# Patient Record
Sex: Female | Born: 1935 | Race: Black or African American | Hispanic: No | Marital: Single | Smoking: Never smoker
Health system: Southern US, Community
[De-identification: ages and names within clinical notes are randomized; demographics above are authoritative.]

## PROBLEM LIST (undated history)

## (undated) DIAGNOSIS — M109 Gout, unspecified: Secondary | ICD-10-CM

## (undated) DIAGNOSIS — N390 Urinary tract infection, site not specified: Secondary | ICD-10-CM

## (undated) DIAGNOSIS — N183 Chronic kidney disease, stage 3 unspecified: Secondary | ICD-10-CM

## (undated) DIAGNOSIS — I10 Essential (primary) hypertension: Secondary | ICD-10-CM

## (undated) DIAGNOSIS — D649 Anemia, unspecified: Secondary | ICD-10-CM

## (undated) DIAGNOSIS — M1990 Unspecified osteoarthritis, unspecified site: Secondary | ICD-10-CM

## (undated) DIAGNOSIS — I4891 Unspecified atrial fibrillation: Secondary | ICD-10-CM

## (undated) DIAGNOSIS — I509 Heart failure, unspecified: Secondary | ICD-10-CM

## (undated) DIAGNOSIS — R51 Headache: Secondary | ICD-10-CM

## (undated) DIAGNOSIS — E860 Dehydration: Secondary | ICD-10-CM

## (undated) HISTORY — PX: BREAST SURGERY: SHX581

## (undated) HISTORY — PX: ABDOMINAL HYSTERECTOMY: SHX81

---

## 1999-02-22 ENCOUNTER — Other Ambulatory Visit: Admission: RE | Admit: 1999-02-22 | Discharge: 1999-02-22 | Payer: Self-pay | Admitting: Obstetrics and Gynecology

## 2000-02-01 ENCOUNTER — Other Ambulatory Visit: Admission: RE | Admit: 2000-02-01 | Discharge: 2000-02-01 | Payer: Self-pay | Admitting: Obstetrics and Gynecology

## 2000-09-11 ENCOUNTER — Other Ambulatory Visit: Admission: RE | Admit: 2000-09-11 | Discharge: 2000-09-11 | Payer: Self-pay | Admitting: Obstetrics and Gynecology

## 2001-03-04 ENCOUNTER — Other Ambulatory Visit: Admission: RE | Admit: 2001-03-04 | Discharge: 2001-03-04 | Payer: Self-pay | Admitting: Obstetrics and Gynecology

## 2001-03-20 ENCOUNTER — Ambulatory Visit (HOSPITAL_COMMUNITY): Admission: RE | Admit: 2001-03-20 | Discharge: 2001-03-20 | Payer: Self-pay | Admitting: Family Medicine

## 2001-03-20 ENCOUNTER — Encounter: Payer: Self-pay | Admitting: Family Medicine

## 2001-09-11 ENCOUNTER — Other Ambulatory Visit: Admission: RE | Admit: 2001-09-11 | Discharge: 2001-09-11 | Payer: Self-pay | Admitting: Obstetrics and Gynecology

## 2002-03-07 ENCOUNTER — Other Ambulatory Visit: Admission: RE | Admit: 2002-03-07 | Discharge: 2002-03-07 | Payer: Self-pay | Admitting: Obstetrics and Gynecology

## 2002-03-25 ENCOUNTER — Encounter: Payer: Self-pay | Admitting: Family Medicine

## 2002-03-25 ENCOUNTER — Ambulatory Visit (HOSPITAL_COMMUNITY): Admission: RE | Admit: 2002-03-25 | Discharge: 2002-03-25 | Payer: Self-pay | Admitting: Family Medicine

## 2002-12-10 ENCOUNTER — Other Ambulatory Visit: Admission: RE | Admit: 2002-12-10 | Discharge: 2002-12-10 | Payer: Self-pay | Admitting: Obstetrics and Gynecology

## 2003-04-01 ENCOUNTER — Encounter: Payer: Self-pay | Admitting: Family Medicine

## 2003-04-01 ENCOUNTER — Ambulatory Visit (HOSPITAL_COMMUNITY): Admission: RE | Admit: 2003-04-01 | Discharge: 2003-04-01 | Payer: Self-pay | Admitting: Family Medicine

## 2003-06-29 ENCOUNTER — Other Ambulatory Visit: Admission: RE | Admit: 2003-06-29 | Discharge: 2003-06-29 | Payer: Self-pay | Admitting: Obstetrics and Gynecology

## 2003-12-17 ENCOUNTER — Other Ambulatory Visit: Admission: RE | Admit: 2003-12-17 | Discharge: 2003-12-17 | Payer: Self-pay | Admitting: Obstetrics and Gynecology

## 2004-04-04 ENCOUNTER — Ambulatory Visit (HOSPITAL_COMMUNITY): Admission: RE | Admit: 2004-04-04 | Discharge: 2004-04-04 | Payer: Self-pay | Admitting: Family Medicine

## 2004-06-29 ENCOUNTER — Other Ambulatory Visit: Admission: RE | Admit: 2004-06-29 | Discharge: 2004-06-29 | Payer: Self-pay | Admitting: Obstetrics and Gynecology

## 2004-12-14 ENCOUNTER — Other Ambulatory Visit: Admission: RE | Admit: 2004-12-14 | Discharge: 2004-12-14 | Payer: Self-pay | Admitting: Obstetrics and Gynecology

## 2005-06-19 ENCOUNTER — Ambulatory Visit (HOSPITAL_COMMUNITY): Admission: RE | Admit: 2005-06-19 | Discharge: 2005-06-19 | Payer: Self-pay | Admitting: Family Medicine

## 2005-07-17 ENCOUNTER — Other Ambulatory Visit: Admission: RE | Admit: 2005-07-17 | Discharge: 2005-07-17 | Payer: Self-pay | Admitting: Obstetrics and Gynecology

## 2005-08-17 ENCOUNTER — Ambulatory Visit (HOSPITAL_COMMUNITY): Admission: RE | Admit: 2005-08-17 | Discharge: 2005-08-17 | Payer: Self-pay | Admitting: Family Medicine

## 2005-12-27 ENCOUNTER — Other Ambulatory Visit: Admission: RE | Admit: 2005-12-27 | Discharge: 2005-12-27 | Payer: Self-pay | Admitting: Obstetrics and Gynecology

## 2006-06-06 ENCOUNTER — Other Ambulatory Visit: Admission: RE | Admit: 2006-06-06 | Discharge: 2006-06-06 | Payer: Self-pay | Admitting: Obstetrics and Gynecology

## 2006-06-28 ENCOUNTER — Ambulatory Visit (HOSPITAL_COMMUNITY): Admission: RE | Admit: 2006-06-28 | Discharge: 2006-06-28 | Payer: Self-pay | Admitting: Obstetrics and Gynecology

## 2007-07-09 ENCOUNTER — Ambulatory Visit (HOSPITAL_COMMUNITY): Admission: RE | Admit: 2007-07-09 | Discharge: 2007-07-09 | Payer: Self-pay | Admitting: Family Medicine

## 2008-07-30 ENCOUNTER — Ambulatory Visit (HOSPITAL_COMMUNITY): Admission: RE | Admit: 2008-07-30 | Discharge: 2008-07-30 | Payer: Self-pay | Admitting: Family Medicine

## 2009-08-12 ENCOUNTER — Encounter (INDEPENDENT_AMBULATORY_CARE_PROVIDER_SITE_OTHER): Payer: Self-pay | Admitting: *Deleted

## 2009-08-12 LAB — CONVERTED CEMR LAB
BUN: 41 mg/dL
CO2: 19 meq/L
CO2: 19 meq/L
Calcium: 9.8 mg/dL
Chloride: 110 meq/L
Hgb A1c MFr Bld: 5.5 %
Potassium: 4 meq/L

## 2009-08-25 ENCOUNTER — Ambulatory Visit (HOSPITAL_COMMUNITY): Admission: RE | Admit: 2009-08-25 | Discharge: 2009-08-25 | Payer: Self-pay | Admitting: Family Medicine

## 2009-11-03 ENCOUNTER — Ambulatory Visit: Payer: Self-pay | Admitting: Cardiology

## 2009-11-03 ENCOUNTER — Encounter (INDEPENDENT_AMBULATORY_CARE_PROVIDER_SITE_OTHER): Payer: Self-pay | Admitting: *Deleted

## 2009-11-03 DIAGNOSIS — I10 Essential (primary) hypertension: Secondary | ICD-10-CM

## 2009-11-03 DIAGNOSIS — I4949 Other premature depolarization: Secondary | ICD-10-CM

## 2009-11-03 DIAGNOSIS — R0602 Shortness of breath: Secondary | ICD-10-CM

## 2009-11-03 DIAGNOSIS — D649 Anemia, unspecified: Secondary | ICD-10-CM | POA: Insufficient documentation

## 2009-11-12 ENCOUNTER — Ambulatory Visit: Payer: Self-pay | Admitting: Cardiology

## 2009-11-12 ENCOUNTER — Ambulatory Visit (HOSPITAL_COMMUNITY): Admission: RE | Admit: 2009-11-12 | Discharge: 2009-11-12 | Payer: Self-pay | Admitting: Cardiology

## 2009-11-12 ENCOUNTER — Encounter: Payer: Self-pay | Admitting: Cardiology

## 2009-11-16 ENCOUNTER — Encounter (INDEPENDENT_AMBULATORY_CARE_PROVIDER_SITE_OTHER): Payer: Self-pay | Admitting: *Deleted

## 2010-08-29 ENCOUNTER — Ambulatory Visit (HOSPITAL_COMMUNITY): Admission: RE | Admit: 2010-08-29 | Discharge: 2010-08-29 | Payer: Self-pay | Admitting: Family Medicine

## 2010-11-20 ENCOUNTER — Encounter: Payer: Self-pay | Admitting: Obstetrics and Gynecology

## 2010-12-01 NOTE — Assessment & Plan Note (Signed)
Summary: NP6 HX OF PVC   Visit Type:  Initial Consult Primary Provider:  Dr. Iona Beard   History of Present Illness: Courtney Bradford comes in today for evaluation and management of palpitations and PVCs. She is sent by Dr. Berdine Addison.  She has a history of diabetes, insulin-dependent, and hypertension for about 20 years. Her son Courtney Bradford gives most of the history.  She apparently has had some palpitations in the past and these have not changed. There rest usually generally a few seconds in duration. They're not associated with dizziness, presyncope, or syncope. They do not occur with exertion.  She's had no nausea vomiting chest pain or angina. She does not complain of any shortness of breath or dyspnea on exertion.  Recent hemoglobin A1c was normal at 5.2.Her lipids are also remarkably good with total cholesterol 135, triglycerides of 64, HDL 44 and LDL of 78. Her potassium was 3.7 and creatinine was relatively normal for a lady her age.  Current Medications (verified): 1)  Amlodipine Besy-Benazepril Hcl 5-20 Mg Caps (Amlodipine Besy-Benazepril Hcl) .... Take 1 Tab Daily 2)  Ferrous Sulfate 325 (65 Fe) Mg Tabs (Ferrous Sulfate) .... Take 1 Tab Daily 3)  Catapres 0.1 Mg Tabs (Clonidine Hcl) .... Take 1 Tablet By Mouth Two Times A Day 4)  Losartan Potassium-Hctz 100-25 Mg Tabs (Losartan Potassium-Hctz) .... Take 1 Tablet By Mouth Once A Day  Allergies (verified): No Known Drug Allergies  Past History:  Past Medical History: Last updated: 11/02/2009 Current Problems:  DM (ICD-250.00) ANEMIA (ICD-285.9) HYPERTENSION (ICD-401.9) DYSPNEA (ICD-786.05)  Review of Systems       negative other than history of present illness.  Vital Signs:  Patient profile:   77 year old female Height:      62 inches Weight:      171 pounds BMI:     31.39 O2 Sat:      97 % on Room air Pulse rate:   78 / minute BP sitting:   193 / 74  (left arm)  Vitals Entered By: Doretha Sou, CNA (November 03, 2009 2:34  PM)  O2 Flow:  Room air  Physical Exam  General:  elderly, looks younger than stated age however, no acute distress Head:  normocephalic and atraumatic Eyes:  PERRLA/EOM intact; conjunctiva and lids normal. Mouth:  partial plate Neck:  Neck supple, no JVD. No masses, thyromegaly or abnormal cervical nodes. Lungs:  Clear bilaterally to auscultation and percussion. Heart:  regular rate and rhythm, no extra systoles while I listened, normal S1-S2, PMI slightly sustained but not displaced Abdomen:  Bowel sounds positive; abdomen soft and non-tender without masses, organomegaly, or hernias noted. No hepatosplenomegaly. Msk:  decreased ROM.   Pulses:  pulses normal in all 4 extremities Extremities:  No clubbing or cyanosis. Neurologic:  Alert and oriented x 3. Skin:  Intact without lesions or rashes. Psych:  Normal affect.   Problems:  Medical Problems Added: 1)  Dx of Hypertension, Benign  (ICD-401.1) 2)  Dx of Premature Ventricular Contractions  (ICD-427.69)  EKG  Procedure date:  11/03/2009  Findings:      normal sinus rhythm, minimal criteria for LVH, poor R-wave progression in the anterior precordium  Impression & Recommendations:  Problem # 1:  PREMATURE VENTRICULAR CONTRACTIONS (ICD-427.69) Assessment New  Her palpitations and history of PVCs and are most like related to her hypertension and LVH. Will obtain a 2-D echocardiogram to assess LV function and any segmental Courtney Bradford motion abnormalities. If this is relatively normal except  for left ventricular hypertrophy, we'll not change or add treatment. She does not seem to be too bothered by them. Her updated medication list for this problem includes:    Amlodipine Besy-benazepril Hcl 5-20 Mg Caps (Amlodipine besy-benazepril hcl) .Marland Kitchen... Take 1 tab daily  Orders: 2-D Echocardiogram (2D Echo)  Problem # 2:  HYPERTENSION (ICD-401.9) Assessment: Unchanged Dr. Berdine Addison recently added clonidine. The vascular and Faythe Dingwall make sure they  follow with him for good systolic control of her blood pressure. Her updated medication list for this problem includes:    Amlodipine Besy-benazepril Hcl 5-20 Mg Caps (Amlodipine besy-benazepril hcl) .Marland Kitchen... Take 1 tab daily    Catapres 0.1 Mg Tabs (Clonidine hcl) .Marland Kitchen... Take 1 tablet by mouth two times a day    Losartan Potassium-hctz 100-25 Mg Tabs (Losartan potassium-hctz) .Marland Kitchen... Take 1 tablet by mouth once a day  Orders: 2-D Echocardiogram (2D Echo)  Problem # 3:  DM (ICD-250.00) Assessment: Unchanged  Her updated medication list for this problem includes:    Amlodipine Besy-benazepril Hcl 5-20 Mg Caps (Amlodipine besy-benazepril hcl) .Marland Kitchen... Take 1 tab daily    Losartan Potassium-hctz 100-25 Mg Tabs (Losartan potassium-hctz) .Marland Kitchen... Take 1 tablet by mouth once a day  Patient Instructions: 1)  Your physician recommends that you schedule a follow-up appointment in: as needed 2)  Your physician has requested that you have an echocardiogram.  Echocardiography is a painless test that uses sound waves to create images of your heart. It provides your doctor with information about the size and shape of your heart and how well your heart's chambers and valves are working.  This procedure takes approximately one hour. There are no restrictions for this procedure.

## 2010-12-01 NOTE — Letter (Signed)
Summary:  Results Doctor, general practice at [place]. [address] [place] [address]   Phone: [phone]  Fax: [phone]      November 16, 2009 MRN: [id number]   [facility] [place], [place]  [address]   Dear Ms. FLACKS,  Your test ordered by Rande Lawman has been reviewed by your physician (or physician assistant) and was found to be normal or stable. Your physician (or physician assistant) felt no changes were needed at this time.  __x__ Echocardiogram  ____ Cardiac Stress Test  ____ Lab Work  ____ Peripheral vascular study of arms, legs or neck  ____ CT scan or X-ray  ____ Lung or Breathing test  ____ Other:  No change in medical treatment at this time, per Dr. Verl Blalock.  Thank you, Tziporah Knoke Baird Cancer RN    Jacqulyn Ducking, MD, Leana Gamer.C.Renella Cunas, MD, F.A.C.C Cristopher Peru, MD, F.A.C.C Rozann Lesches, MD, F.A.C.C Jenkins Rouge, MD, Leana Gamer.C.C

## 2010-12-01 NOTE — Miscellaneous (Signed)
Summary: labs bmp,a1c 08/12/2009  Clinical Lists Changes  Observations: Added new observation of CALCIUM: 9.8 mg/dL (08/12/2009 10:15) Added new observation of CREATININE: 112 mg/dL (08/12/2009 10:15) Added new observation of BUN: 41 mg/dL (08/12/2009 10:15) Added new observation of BG RANDOM: 111 mg/dL (08/12/2009 10:15) Added new observation of CO2 PLSM/SER: 19 meq/L (08/12/2009 10:15) Added new observation of CL SERUM: 110 meq/L (08/12/2009 10:15) Added new observation of K SERUM: 4.0 meq/L (08/12/2009 10:15) Added new observation of NA: 143 meq/L (08/12/2009 10:15) Added new observation of HGBA1C: 5.5 % (08/12/2009 10:15)

## 2010-12-01 NOTE — Miscellaneous (Signed)
Summary: labs bmp 08/13/2009  Clinical Lists Changes  Observations: Added new observation of CALCIUM: 9.8 mg/dL (08/12/2009 12:31) Added new observation of CREATININE: 112 mg/dL (08/12/2009 12:31) Added new observation of BUN: 41 mg/dL (08/12/2009 12:31) Added new observation of BG RANDOM: 111 mg/dL (08/12/2009 12:31) Added new observation of CO2 PLSM/SER: 19 meq/L (08/12/2009 12:31) Added new observation of CL SERUM: 110 meq/L (08/12/2009 12:31) Added new observation of K SERUM: 4.0 meq/L (08/12/2009 12:31) Added new observation of NA: 143 meq/L (08/12/2009 12:31) Added new observation of HGBA1C: 5.5 % (08/12/2009 12:31)

## 2011-05-11 ENCOUNTER — Encounter (HOSPITAL_COMMUNITY)
Admission: RE | Admit: 2011-05-11 | Discharge: 2011-05-11 | Disposition: A | Discharge status: Home / Self Care | Payer: PRIVATE HEALTH INSURANCE | Source: Ambulatory Visit | Attending: Obstetrics and Gynecology | Admitting: Obstetrics and Gynecology

## 2011-05-11 ENCOUNTER — Other Ambulatory Visit: Payer: Self-pay

## 2011-05-11 ENCOUNTER — Encounter (HOSPITAL_COMMUNITY): Payer: Self-pay

## 2011-05-11 HISTORY — DX: Essential (primary) hypertension: I10

## 2011-05-11 HISTORY — DX: Headache: R51

## 2011-05-11 HISTORY — DX: Anemia, unspecified: D64.9

## 2011-05-11 HISTORY — DX: Unspecified osteoarthritis, unspecified site: M19.90

## 2011-05-11 LAB — CBC
HCT: 32.6 % — ABNORMAL LOW (ref 36.0–46.0)
MCH: 29.2 pg (ref 26.0–34.0)
MCHC: 32.5 g/dL (ref 30.0–36.0)
MCV: 89.8 fL (ref 78.0–100.0)
RDW: 12.7 % (ref 11.5–15.5)
WBC: 5.9 10*3/uL (ref 4.0–10.5)

## 2011-05-11 LAB — BASIC METABOLIC PANEL
CO2: 30 mEq/L (ref 19–32)
Calcium: 10.2 mg/dL (ref 8.4–10.5)
Chloride: 104 mEq/L (ref 96–112)
Creatinine, Ser: 1 mg/dL (ref 0.50–1.10)
Sodium: 142 mEq/L (ref 135–145)

## 2011-05-11 MED ORDER — FENTANYL CITRATE 0.05 MG/ML IJ SOLN: 25.0000 ug | INTRAMUSCULAR | Status: DC | PRN

## 2011-05-11 NOTE — Patient Instructions (Addendum)
[place]  05/11/2011   Your procedure is scheduled on:  05/17/11 Wed  Report to [facility] at 7:00  Call this number if you have problems the morning of surgery: [phone]   Remember:   Do not eat food:After Midnight.  Do not drink clear liquids: After Midnight.  Take these medicines the morning of surgery with A SIP OF WATER:Amlodipine, Clonidine   Do not wear jewelry, make-up or nail polish.  Do not bring valuables to the hospital.  Contacts, dentures or bridgework may not be worn into surgery.  Leave suitcase in the car. After surgery it may be brought to your room.  For patients admitted to the hospital, checkout time is 11:00 AM the day of discharge.   Patients discharged the day of surgery will not be allowed to drive home.  Name and phone number of your driver: na  Special Instructions: CHG Shower Shower 2 days before surgery and 1 day before surgery with Hibiclens.   Please read over the following fact sheets that you were given: Pain Booklet and Surgical Site Infection Prevention

## 2011-05-11 NOTE — Anesthesia Preprocedure Evaluation (Addendum)
Anesthesia Evaluation  Name, MR# and DOB Patient awake  General Assessment Comment  Reviewed: Allergy & Precautions, H&P  and Patient's Chart, lab work & pertinent test results  Airway Mallampati: III TM Distance: >3 FB     Dental No notable dental hx (+) Partial Lower and Upper Dentures   Pulmonaryneg pulmonary ROS    clear to auscultation  pulmonary exam normal   Cardiovascular Regular Normal   Neuro/Psych  GI/Hepatic/Renal negative GI ROS, negative Liver ROS, and negative Renal ROS (+)       Endo/Other  Negative Endocrine ROS (+)   Abdominal Normal abdominal exam  (+)   Musculoskeletal negative musculoskeletal ROS (+)  Hematology negative hematology ROS (+)   Peds  Reproductive/Obstetrics negative OB ROS   Anesthesia Other Findings             Anesthesia Physical Anesthesia Plan  ASA: II  Anesthesia Plan: Spinal, Regional and Combined Spinal and Epidural   Post-op Pain Management:    Induction:   Airway Management Planned: Mask  Additional Equipment:   Intra-op Plan:   Post-operative Plan:   Informed Consent: I have reviewed the patients History and Physical, chart, labs and discussed the procedure including the risks, benefits and alternatives for the proposed anesthesia with the patient or authorized representative who has indicated his/her understanding and acceptance.   Dental advisory given  Plan Discussed with: Anesthesiologist (AP)  Anesthesia Plan Comments:       Anesthesia Quick Evaluation

## 2011-05-16 DIAGNOSIS — N393 Stress incontinence (female) (male): Secondary | ICD-10-CM | POA: Diagnosis present

## 2011-05-16 DIAGNOSIS — N8189 Other female genital prolapse: Secondary | ICD-10-CM | POA: Diagnosis present

## 2011-05-16 MED ORDER — CEFAZOLIN SODIUM-DEXTROSE 2-3 GM-% IV SOLR
2.0000 g | INTRAVENOUS | Status: DC
  Administered 2011-05-17: 2 g via INTRAVENOUS
  Filled 2011-05-16 (×2): qty 50

## 2011-05-16 NOTE — H&P (Signed)
[name], Courtney Bradford NO.:  [id number]  MEDICAL RECORD NO.:  [id number]  LOCATION:  [facility]C                           FACILITY:  [facility]  PHYSICIAN:  Eli Hose, M.D.DATE OF BIRTH:  [birth year]  DATE OF ADMISSION:  05/11/2011 DATE OF DISCHARGE:  05/11/2011                             HISTORY & PHYSICAL   HISTORY OF PRESENT ILLNESS:  [name] is a 77 year old female, para 6- 0-0-6, who presents for an anterior and posterior colporrhaphy with placement of a tension-free vaginal tape.  She will also have cystoscopy.  The patient has been followed at the [facility] and [facility]cology Division of [facility] for women. The patient reports increasing stress urinary incontinence and pelvic pressure symptoms.  A pessary was not successful in relieving her discomfort.  In 1992, the patient had a total abdominal hysterectomy with bilateral salpingo-oophorectomy because of a stage IB1 adenocarcinoma of the endometrium.  She has been without evidence of disease since that time.  OBSTETRICAL HISTORY:  The patient has had six vaginal deliveries at term.  DRUG ALLERGIES:  No known drug allergies.  PAST MEDICAL HISTORY:  The patient has a history of hypertension, diabetes, gout, osteoarthritis, and obesity.  She has been cleared for surgery by her family physician (Dr. Iona Beard).  CURRENT MEDICATIONS: 1. Tekturna HCT 150/12.5 one p.o. daily (this will not be renewed     after the current dosage). 2. Benazepril/amlodipine 20/5 one p.o. daily. 3. Clonidine 0.1 mg 1 tablet twice each day. 4. Calcium 600 mg and vitamin D 1 tablet twice each day. 5. Iron 325 mg one each day. 6. Humulin N insulin and has 10 units in the morning at breakfast.  PAST SURGICAL HISTORY:  The patient has had a partial mastectomy for benign disease.  She has had various other minor surgical procedures.  SOCIAL HISTORY:  The patient denies cigarette use, alcohol use,  and recreational drug use.  REVIEW OF SYSTEMS:  Please see history of present illness.  FAMILY HISTORY:  Noncontributory.  PHYSICAL EXAM:  VITAL SIGNS:  Weight is 165 pounds.  Height is 5 feet 2 inches. HEENT:  Within normal limits. CHEST:  Clear. HEART:  Regular rate and rhythm. BREASTS:  Are without masses. ABDOMEN:  Nontender. EXTREMITIES:  Shows crepitus in her left knee.  There is no tenderness. There is mild edema bilaterally in the lower extremities. NEUROLOGIC:  Grossly normal. PELVIC:  External genitalia is atrophic.  The vagina shows a large cystocele and a large rectocele with loss of urethrovesical angle. There are atrophic changes.  The vaginal vault is well suspended however.  Cervix is absent.  Uterus is absent.  Adnexa, no masses and rectovaginal exam confirms.  ASSESSMENT: 1. Symptomatic pelvic relaxation with increasing stress urinary     incontinence.  The patient reports that her symptoms have     progressed to the point that she has a "urine" smell and that she     cannot stop her bladder from leaking. 2. Hypertension. 3. Diabetes. 4. Gout. 5. Osteoarthritis. 6. Obesity.  PLAN:  The patient will undergo an anterior and posterior colporrhaphy with a tension-free vaginal tape placement.  She will  also have cystoscopy.  She understands the indications for her surgical procedure as well as her alternative treatment options.  We have discussed the concerns about mesh erosion.  She has been informed of the [facility] website. She accepts the risks, but not limited to, anesthetic complications, bleeding, infection, and possible damage to surrounding organs.  We will give the patient subcutaneous heparin as well as sequential compression devices for venous thrombosis prophylaxis.     Eli Hose, M.D.     AVS/MEDQ  D:  05/16/2011  T:  05/16/2011  Job:  [id number]  cc:   Barrie Folk. Berdine Addison, MD Fax: [phone]

## 2011-05-17 ENCOUNTER — Encounter (HOSPITAL_COMMUNITY)
Admission: RE | Disposition: A | Discharge status: Home / Self Care | Payer: Self-pay | Source: Ambulatory Visit | Attending: Obstetrics and Gynecology

## 2011-05-17 ENCOUNTER — Encounter (HOSPITAL_COMMUNITY): Payer: Self-pay | Admitting: Anesthesiology

## 2011-05-17 ENCOUNTER — Ambulatory Visit (HOSPITAL_COMMUNITY): Payer: PRIVATE HEALTH INSURANCE | Admitting: Anesthesiology

## 2011-05-17 ENCOUNTER — Inpatient Hospital Stay (HOSPITAL_COMMUNITY)
Admission: RE | Admit: 2011-05-17 | Discharge: 2011-05-19 | DRG: 748 | Disposition: A | Discharge status: Home / Self Care | Payer: PRIVATE HEALTH INSURANCE | Source: Ambulatory Visit | Attending: Obstetrics and Gynecology | Admitting: Obstetrics and Gynecology

## 2011-05-17 DIAGNOSIS — D649 Anemia, unspecified: Secondary | ICD-10-CM | POA: Diagnosis present

## 2011-05-17 DIAGNOSIS — I4949 Other premature depolarization: Secondary | ICD-10-CM

## 2011-05-17 DIAGNOSIS — N8189 Other female genital prolapse: Secondary | ICD-10-CM

## 2011-05-17 DIAGNOSIS — M109 Gout, unspecified: Secondary | ICD-10-CM | POA: Diagnosis present

## 2011-05-17 DIAGNOSIS — N393 Stress incontinence (female) (male): Secondary | ICD-10-CM | POA: Diagnosis present

## 2011-05-17 DIAGNOSIS — I441 Atrioventricular block, second degree: Secondary | ICD-10-CM | POA: Diagnosis not present

## 2011-05-17 DIAGNOSIS — E119 Type 2 diabetes mellitus without complications: Secondary | ICD-10-CM | POA: Diagnosis present

## 2011-05-17 DIAGNOSIS — I10 Essential (primary) hypertension: Secondary | ICD-10-CM | POA: Diagnosis present

## 2011-05-17 DIAGNOSIS — I498 Other specified cardiac arrhythmias: Secondary | ICD-10-CM

## 2011-05-17 DIAGNOSIS — N993 Prolapse of vaginal vault after hysterectomy: Principal | ICD-10-CM | POA: Diagnosis present

## 2011-05-17 HISTORY — PX: BLADDER SUSPENSION: SHX72

## 2011-05-17 HISTORY — PX: ANTERIOR AND POSTERIOR REPAIR: SHX5121

## 2011-05-17 LAB — GLUCOSE, CAPILLARY
Glucose-Capillary: 115 mg/dL — ABNORMAL HIGH (ref 70–99)
Glucose-Capillary: 117 mg/dL — ABNORMAL HIGH (ref 70–99)
Glucose-Capillary: 112 mg/dL — ABNORMAL HIGH (ref 70–99)
Glucose-Capillary: 155 mg/dL — ABNORMAL HIGH (ref 70–99)

## 2011-05-17 SURGERY — ANTERIOR (CYSTOCELE) AND POSTERIOR REPAIR (RECTOCELE)
Anesthesia: General | Site: Vagina

## 2011-05-17 MED ORDER — OLOPATADINE HCL 0.2 % OP SOLN: 1.0000 [drp] | Freq: Every day | OPHTHALMIC | Status: DC

## 2011-05-17 MED ORDER — MORPHINE SULFATE (PF) 1 MG/ML IV SOLN: INTRAVENOUS | Status: DC

## 2011-05-17 MED ORDER — ONDANSETRON HCL 4 MG/2ML IJ SOLN: 4.0000 mg | Freq: Four times a day (QID) | INTRAMUSCULAR | Status: DC | PRN

## 2011-05-17 MED ORDER — MEPERIDINE HCL 25 MG/ML IJ SOLN: 6.2500 mg | INTRAMUSCULAR | Status: DC | PRN

## 2011-05-17 MED ORDER — SODIUM CHLORIDE 0.9 % IJ SOLN
3.0000 mL | Freq: Two times a day (BID) | INTRAMUSCULAR | Status: DC
  Administered 2011-05-18: 3 mL via INTRAVENOUS

## 2011-05-17 MED ORDER — AMLODIPINE BESYLATE 5 MG PO TABS
5.0000 mg | ORAL_TABLET | Freq: Every day | ORAL | Status: DC
Start: 2011-05-17 — End: 2011-05-19
  Administered 2011-05-18 – 2011-05-19 (×2): 5 mg via ORAL
  Filled 2011-05-17 (×3): qty 1

## 2011-05-17 MED ORDER — ONDANSETRON HCL 4 MG PO TABS: 4.0000 mg | ORAL_TABLET | Freq: Four times a day (QID) | ORAL | Status: DC | PRN

## 2011-05-17 MED ORDER — CLONIDINE HCL 0.1 MG PO TABS
0.1000 mg | ORAL_TABLET | Freq: Two times a day (BID) | ORAL | Status: DC
  Administered 2011-05-17 – 2011-05-19 (×4): 0.1 mg via ORAL
  Filled 2011-05-17 (×5): qty 1

## 2011-05-17 MED ORDER — INDIGOTINDISULFONATE SODIUM 8 MG/ML IJ SOLN
INTRAMUSCULAR | Status: DC | PRN
  Administered 2011-05-17: 5 mL via INTRAVENOUS

## 2011-05-17 MED ORDER — KETOROLAC TROMETHAMINE 30 MG/ML IJ SOLN
INTRAMUSCULAR | Status: DC | PRN
  Administered 2011-05-17: 30 mg via INTRAVENOUS

## 2011-05-17 MED ORDER — DEXTROSE-NACL 5-0.45 % IV SOLN
INTRAVENOUS | Status: DC
  Administered 2011-05-17 (×2): via INTRAVENOUS

## 2011-05-17 MED ORDER — ACETAMINOPHEN 325 MG PO TABS: 325.0000 mg | ORAL_TABLET | ORAL | Status: DC | PRN

## 2011-05-17 MED ORDER — KETOROLAC TROMETHAMINE 30 MG/ML IJ SOLN: 30.0000 mg | Freq: Four times a day (QID) | INTRAMUSCULAR | Status: AC | PRN

## 2011-05-17 MED ORDER — SIMETHICONE 80 MG PO CHEW: 80.0000 mg | CHEWABLE_TABLET | Freq: Four times a day (QID) | ORAL | Status: DC | PRN

## 2011-05-17 MED ORDER — INSULIN NPH (HUMAN) (ISOPHANE) 100 UNIT/ML ~~LOC~~ SUSP
10.0000 [IU] | SUBCUTANEOUS | Status: DC
  Administered 2011-05-18 – 2011-05-19 (×2): 10 [IU] via SUBCUTANEOUS
  Filled 2011-05-17: qty 3

## 2011-05-17 MED ORDER — STERILE WATER FOR IRRIGATION IR SOLN
Status: DC | PRN
  Administered 2011-05-17: 1000 mL via INTRAVESICAL

## 2011-05-17 MED ORDER — MENTHOL 3 MG MT LOZG: 1.0000 | LOZENGE | OROMUCOSAL | Status: DC | PRN

## 2011-05-17 MED ORDER — SODIUM CHLORIDE 0.9 % IJ SOLN: 3.0000 mL | INTRAMUSCULAR | Status: DC | PRN

## 2011-05-17 MED ORDER — SODIUM CHLORIDE 0.9 % IV SOLN: 250.0000 mL | INTRAVENOUS | Status: DC

## 2011-05-17 MED ORDER — BISACODYL 5 MG PO TBEC
5.0000 mg | DELAYED_RELEASE_TABLET | Freq: Every day | ORAL | Status: DC | PRN
  Filled 2011-05-17: qty 1

## 2011-05-17 MED ORDER — FENTANYL CITRATE 0.05 MG/ML IJ SOLN: 25.0000 ug | INTRAMUSCULAR | Status: DC | PRN

## 2011-05-17 MED ORDER — HEPARIN SODIUM (PORCINE) 5000 UNIT/ML IJ SOLN
INTRAMUSCULAR | Status: AC
  Filled 2011-05-17: qty 1

## 2011-05-17 MED ORDER — MORPHINE SULFATE (PF) 0.5 MG/ML IJ SOLN
INTRAMUSCULAR | Status: DC | PRN
  Administered 2011-05-17: 100 ug via INTRATHECAL

## 2011-05-17 MED ORDER — KETOROLAC TROMETHAMINE 60 MG/2ML IM SOLN
60.0000 mg | Freq: Once | INTRAMUSCULAR | Status: AC | PRN
  Filled 2011-05-17: qty 2

## 2011-05-17 MED ORDER — CEFAZOLIN SODIUM 1-5 GM-% IV SOLN
INTRAVENOUS | Status: DC | PRN
  Administered 2011-05-17: 2 g via INTRAVENOUS

## 2011-05-17 MED ORDER — NALBUPHINE HCL 10 MG/ML IJ SOLN
5.0000 mg | INTRAMUSCULAR | Status: AC | PRN
  Filled 2011-05-17: qty 1

## 2011-05-17 MED ORDER — ONDANSETRON HCL 4 MG/2ML IJ SOLN
INTRAMUSCULAR | Status: DC | PRN
  Administered 2011-05-17: 4 mg via INTRAVENOUS

## 2011-05-17 MED ORDER — ONDANSETRON HCL 4 MG/2ML IJ SOLN: 4.0000 mg | Freq: Once | INTRAMUSCULAR | Status: DC | PRN

## 2011-05-17 MED ORDER — ACETAMINOPHEN 325 MG PO TABS: 650.0000 mg | ORAL_TABLET | Freq: Four times a day (QID) | ORAL | Status: DC | PRN

## 2011-05-17 MED ORDER — SODIUM CHLORIDE 0.9 % IR SOLN
Status: DC | PRN
  Administered 2011-05-17: 1000 mL

## 2011-05-17 MED ORDER — PROPOFOL 10 MG/ML IV EMUL
INTRAVENOUS | Status: DC | PRN
  Administered 2011-05-17: 25 ug/kg/min via INTRAVENOUS

## 2011-05-17 MED ORDER — INSULIN REGULAR HUMAN 100 UNIT/ML IJ SOLN
10.0000 [IU] | INTRAMUSCULAR | Status: DC
  Administered 2011-05-18 – 2011-05-19 (×2): 10 [IU] via SUBCUTANEOUS
  Filled 2011-05-17 (×4): qty 3

## 2011-05-17 MED ORDER — HEPARIN SODIUM (PORCINE) 5000 UNIT/ML IJ SOLN
5000.0000 [IU] | Freq: Three times a day (TID) | INTRAMUSCULAR | Status: AC
  Administered 2011-05-17: 07:00:00 via SUBCUTANEOUS
  Administered 2011-05-17 – 2011-05-19 (×5): 5000 [IU] via SUBCUTANEOUS
  Filled 2011-05-17 (×6): qty 1

## 2011-05-17 MED ORDER — EPHEDRINE SULFATE 50 MG/ML IJ SOLN
INTRAMUSCULAR | Status: DC | PRN
  Administered 2011-05-17: 10 mg via INTRAVENOUS

## 2011-05-17 MED ORDER — IBUPROFEN 600 MG PO TABS: 600.0000 mg | ORAL_TABLET | Freq: Four times a day (QID) | ORAL | Status: DC | PRN

## 2011-05-17 MED ORDER — GUAIFENESIN 100 MG/5ML PO SOLN: 15.0000 mL | ORAL | Status: DC | PRN

## 2011-05-17 MED ORDER — MIDAZOLAM HCL 5 MG/5ML IJ SOLN
INTRAMUSCULAR | Status: DC | PRN
  Administered 2011-05-17: 1 mg via INTRAVENOUS

## 2011-05-17 MED ORDER — SODIUM CHLORIDE 0.9 % IV SOLN
1.0000 ug/kg/h | INTRAVENOUS | Status: DC | PRN
  Filled 2011-05-17: qty 2.5

## 2011-05-17 MED ORDER — ALISKIREN FUMARATE 150 MG PO TABS
150.0000 mg | ORAL_TABLET | Freq: Every day | ORAL | Status: DC
  Administered 2011-05-18 – 2011-05-19 (×2): 150 mg via ORAL
  Filled 2011-05-17 (×3): qty 1

## 2011-05-17 MED ORDER — NALOXONE HCL 0.4 MG/ML IJ SOLN: 0.4000 mg | INTRAMUSCULAR | Status: DC | PRN

## 2011-05-17 MED ORDER — BUPIVACAINE-EPINEPHRINE PF 0.5-1:200000 % IJ SOLN
INTRAMUSCULAR | Status: DC | PRN
  Administered 2011-05-17: 13 mL

## 2011-05-17 MED ORDER — HEPARIN SODIUM (PORCINE) 5000 UNIT/ML IJ SOLN
5000.0000 [IU] | Freq: Once | INTRAMUSCULAR | Status: DC
  Filled 2011-05-17: qty 1

## 2011-05-17 MED ORDER — HYDROCODONE-ACETAMINOPHEN 5-325 MG PO TABS: 1.0000 | ORAL_TABLET | ORAL | Status: DC | PRN

## 2011-05-17 MED ORDER — BISACODYL 10 MG RE SUPP: 10.0000 mg | Freq: Every day | RECTAL | Status: DC | PRN

## 2011-05-17 MED ORDER — LACTATED RINGERS IV SOLN
INTRAVENOUS | Status: DC
  Administered 2011-05-17 (×2): via INTRAVENOUS

## 2011-05-17 SURGICAL SUPPLY — 37 items
BLADE SURG 15 STRL LF C SS BP (BLADE) ×6 IMPLANT
BLADE SURG 15 STRL SS (BLADE) ×9
CATH BONANNO SUPRAPUBIC 14G (CATHETERS) IMPLANT
CATH FOLEY 2WAY  5CC 16FR SIL (CATHETERS)
CATH FOLEY 2WAY 5CC 16FR SIL (CATHETERS) ×2 IMPLANT
CATH FOLEY 2WAY SLVR 18FR 30CC (CATHETERS) ×2 IMPLANT
CLOTH BEACON ORANGE TIMEOUT ST (SAFETY) ×3 IMPLANT
CONT PATH 16OZ SNAP LID 3702 (MISCELLANEOUS) IMPLANT
DECANTER SPIKE VIAL GLASS SM (MISCELLANEOUS) ×1 IMPLANT
DERMABOND ADVANCED (GAUZE/BANDAGES/DRESSINGS) ×1 IMPLANT
DRAPE CAMERA CLOSED 9X96 (DRAPES) IMPLANT
GAUZE PACKING 1 X5 YD ST (GAUZE/BANDAGES/DRESSINGS) ×1 IMPLANT
GAUZE SPONGE 4X4 12PLY STRL LF (GAUZE/BANDAGES/DRESSINGS) ×3 IMPLANT
GLOVE BIOGEL PI IND STRL 8.5 (GLOVE) ×2 IMPLANT
GLOVE BIOGEL PI INDICATOR 8.5 (GLOVE) ×1
GLOVE ECLIPSE 8.0 STRL XLNG CF (GLOVE) ×6 IMPLANT
GOWN BRE IMP SLV AUR LG STRL (GOWN DISPOSABLE) ×9 IMPLANT
GOWN STRL REIN 2XL LVL4 (GOWN DISPOSABLE) ×3 IMPLANT
NDL SPNL 22GX3.5 QUINCKE BK (NEEDLE) IMPLANT
NEEDLE HYPO 22GX1.5 SAFETY (NEEDLE) ×1 IMPLANT
NEEDLE SPNL 22GX3.5 QUINCKE BK (NEEDLE) IMPLANT
NS IRRIG 1000ML POUR BTL (IV SOLUTION) ×3 IMPLANT
PACK VAGINAL WOMENS (CUSTOM PROCEDURE TRAY) ×3 IMPLANT
SET CYSTO W/LG BORE CLAMP LF (SET/KITS/TRAYS/PACK) ×1 IMPLANT
SLING TRANS VAGINAL TAPE (Sling) ×1 IMPLANT
SLING UTERINE/ABD GYNECARE TVT (Sling) IMPLANT
SPONGE LAP 4X18 X RAY DECT (DISPOSABLE) ×4 IMPLANT
SUT CHROMIC 0 SH (SUTURE) ×6 IMPLANT
SUT CHROMIC 2 0 SH (SUTURE) ×9 IMPLANT
SUT SILK 2 0 FSL 18 (SUTURE) ×3 IMPLANT
SUT VIC AB 2-0 CT1 27 (SUTURE) ×6
SUT VIC AB 2-0 CT1 TAPERPNT 27 (SUTURE) ×4 IMPLANT
SUT VICRYL 0 UR6 27IN ABS (SUTURE) ×2 IMPLANT
SYR 20CC LL (SYRINGE) ×1 IMPLANT
TOWEL OR 17X24 6PK STRL BLUE (TOWEL DISPOSABLE) ×6 IMPLANT
TRAY FOLEY CATH 14FR (SET/KITS/TRAYS/PACK) ×3 IMPLANT
WATER STERILE IRR 1000ML POUR (IV SOLUTION) ×3 IMPLANT

## 2011-05-17 NOTE — Interval H&P Note (Signed)
History and Physical Interval Note:   05/17/2011   8:02 AM   Courtney Bradford  has presented today for surgery, with the diagnosis of sui;pelvic relaxtion  The various methods of treatment have been discussed with the patient and family. After consideration of risks, benefits and other options for treatment, the patient has consented to  Procedure(s): ANTERIOR (CYSTOCELE) AND POSTERIOR REPAIR (RECTOCELE), TVT and Cystoscopy as a surgical intervention .  I have reviewed the patients' chart and labs.  Questions were answered to the patient's satisfaction.     Eli Hose  MD

## 2011-05-17 NOTE — Progress Notes (Signed)
Subjective: Patient reports tolerating PO.  No SOB or chest pain.  Objective: I have reviewed patient's vital signs and intake and output.  Filed Vitals:   05/17/11 1800  BP: 158/59  Pulse: 58  Temp:   Resp: 18   BS 155  Heart block noted on heart monitor.  General: alert, cooperative and no distress Resp: clear to auscultation bilaterally Cardio: regularly irregular rhythm GI: soft, non-tender; bowel sounds normal; no masses,  no organomegaly Extremities: edema trace Vaginal Bleeding: None  Assessment/Plan: Post Op Day 0 Anterior Repair, TVT, and Cystoscopy Second degree heart block, but no distress. I have consulted Dr. Lia Foyer from the Cardiology Service and he will arrange for the patient to be seen.  LOS: 0 days    Courtney Bradford 05/17/2011, 6:10 PM

## 2011-05-17 NOTE — Transfer of Care (Signed)
Immediate Anesthesia Transfer of Care Note  Patient: Courtney Bradford  Procedure(s) Performed:  ANTERIOR (CYSTOCELE) AND POSTERIOR REPAIR (RECTOCELE) - Anterior repair with TVT bladder sling and cystoscopy; TRANSVAGINAL TAPE (TVT) PROCEDURE  Patient Location: PACU  Anesthesia Type: Spinal  Level of Consciousness: awake  Airway & Oxygen Therapy: Patient Spontanous Breathing  Post-op Assessment: Report given to PACU RN and Post -op Vital signs reviewed and stable  Post vital signs: Reviewed and stable  Complications: No apparent anesthesia complications

## 2011-05-17 NOTE — Progress Notes (Signed)
Day of Surgery Procedure(s): ANTERIOR (CYSTOCELE) AND POSTERIOR REPAIR (RECTOCELE) TRANSVAGINAL TAPE (TVT) PROCEDURE  Subjective: Patient reports no complaints.    Objective: Filed Vitals:   05/17/11 1600  BP: 168/65  Pulse: 62  Temp: 98.5 F (36.9 C)  Resp: 15  UOP 200cc/54min  I have reviewed patient's vital signs.  General: alert and cooperative Resp: clear to auscultation bilaterally Cardio: regular rate and rhythm GI: soft, non-tender; bowel sounds normal; no masses,  no organomegaly Extremities: extremities normal, atraumatic, no cyanosis or edema and Homans sign is negative, no sign of DVT Vaginal Bleeding: none and vaginal packing in place  Assessment: s/p Procedure(s): ANTERIOR (CYSTOCELE) AND POSTERIOR REPAIR (RECTOCELE) TRANSVAGINAL TAPE (TVT) PROCEDURE: stable  Plan: Advance diet as tolerated Continue strict I's/O's UOP is good IS encouraged SCDs for DVT prophylaxis as well as SQ heparin   LOS: 0 days    Simran Mannis Y 05/17/2011, 4:26 PM

## 2011-05-17 NOTE — Brief Op Note (Signed)
05/17/2011  10:39 AM  PATIENT:  Courtney Bradford  77 y.o. female  PRE-OPERATIVE DIAGNOSIS:  Stress induced incoontinence;pelvic relaxtion  POST-OPERATIVE DIAGNOSIS:  same  PROCEDURE:  Procedure(s): ANTERIOR (CYSTOCELE) AND POSTERIOR REPAIR (RECTOCELE) TRANSVAGINAL TAPE (TVT) PROCEDURE  SURGEON:  Surgeon(s): Richardo Priest  PHYSICIAN ASSISTANT:   ASSISTANTS: Gildardo Cranker, M.D.   Everett Graff, M.D.   ANESTHESIA:   spinal  ESTIMATED BLOOD LOSS: * No blood loss amount entered *   BLOOD ADMINISTERED:none  DRAINS: none   LOCAL MEDICATIONS USED:  MARCAINE 10CC  SPECIMEN:  No Specimen  DISPOSITION OF SPECIMEN:  N/A  COUNTS:  YES  TOURNIQUET:  * No tourniquets in log *  DICTATION #: [id number]  PLAN OF CARE: PostOp  PATIENT DISPOSITION:  PACU - hemodynamically stable.   Delay start of Pharmacological VTE agent (>24hrs) due to surgical blood loss or risk of bleeding:  no

## 2011-05-17 NOTE — Consult Note (Signed)
Cardiology Consult [facility] MD-Dr. Verl Blalock PMD- Dr. Berdine Addison  Patient interviewed and examined.  Prior records obtained and reviewed.  Full note to follow.  She was last seen by [facility] in 10/2009 for evaluation of palpitations and PVCs.  Hypertension was suboptimally controlled, and her medications were adjusted.  Echocardiogram showed borderline LVH and normal LV systolic function.  No further testing, treatment nor F/u was advised.  She has done well with no subsequent medical problems.  Post-op bradycardia was noted.  Rhythm strip shows second degree AVB, type 1.  Increase vagal tone is likely contributing to conduction abnormalities although her discomfort is minimal.  She is on no medication to exaccerate conduction system disease other than perhaps clonidine.  For now we will make no changes in meds, R/O MI and optimize Rx of hypertension.  Electrolytes, Mg, and TSH are pending.  We will follow with you.      Jacqulyn Ducking, MD

## 2011-05-17 NOTE — Progress Notes (Signed)
Pt. Transferred from Women's Unit with asymptomatic bradycardia in to bed #373. Pt. A&O and denies any pain or discomforts at this time. Telemetry monitoring revealing 2nd degree Heart Block. EKG strips with values printed and placed in chart.  MRSA screening done. Pt. Made comfortable and oriented to ICU surroundings and procedures. Education done and questions answered with family at bedside

## 2011-05-17 NOTE — Progress Notes (Signed)
Encounter addended by: Riccardo Dubin on: 05/17/2011  3:42 PM<BR>     Documentation filed: Notes Section

## 2011-05-17 NOTE — Progress Notes (Signed)
Dr. Raphael Gibney aware of CBG - 155.  No new orders received

## 2011-05-17 NOTE — H&P (View-Only) (Signed)
[name], [name] NO.:  [id number]  MEDICAL RECORD NO.:  [id number]  LOCATION:  [facility]                           FACILITY:  [place]  PHYSICIAN:  Eli Hose, M.D.DATE OF BIRTH:  [birth year]  DATE OF ADMISSION:  05/11/2011 DATE OF DISCHARGE:  05/11/2011                             HISTORY & PHYSICAL   HISTORY OF PRESENT ILLNESS:  Courtney Bradford is a 77 year old female, para 6- 0-0-6, who presents for an anterior and posterior colporrhaphy with placement of a tension-free vaginal tape.  She will also have cystoscopy.  The patient has been followed at the [facility] and Gynecology Division of [facility] for women. The patient reports increasing stress urinary incontinence and pelvic pressure symptoms.  A pessary was not successful in relieving her discomfort.  In 1992, the patient had a total abdominal hysterectomy with bilateral salpingo-oophorectomy because of a stage IB1 adenocarcinoma of the endometrium.  She has been without evidence of disease since that time.  OBSTETRICAL HISTORY:  The patient has had six vaginal deliveries at term.  DRUG ALLERGIES:  No known drug allergies.  PAST MEDICAL HISTORY:  The patient has a history of hypertension, diabetes, gout, osteoarthritis, and obesity.  She has been cleared for surgery by her family physician (Dr. Iona Beard).  CURRENT MEDICATIONS: 1. Tekturna HCT 150/12.5 one p.o. daily (this will not be renewed     after the current dosage). 2. Benazepril/amlodipine 20/5 one p.o. daily. 3. Clonidine 0.1 mg 1 tablet twice each day. 4. Calcium 600 mg and vitamin D 1 tablet twice each day. 5. Iron 325 mg one each day. 6. Humulin N insulin and has 10 units in the morning at breakfast.  PAST SURGICAL HISTORY:  The patient has had a partial mastectomy for benign disease.  She has had various other minor surgical procedures.  SOCIAL HISTORY:  The patient denies cigarette use, alcohol use,  and recreational drug use.  REVIEW OF SYSTEMS:  Please see history of present illness.  FAMILY HISTORY:  Noncontributory.  PHYSICAL EXAM:  VITAL SIGNS:  Weight is 165 pounds.  Height is 5 feet 2 inches. HEENT:  Within normal limits. CHEST:  Clear. HEART:  Regular rate and rhythm. BREASTS:  Are without masses. ABDOMEN:  Nontender. EXTREMITIES:  Shows crepitus in her left knee.  There is no tenderness. There is mild edema bilaterally in the lower extremities. NEUROLOGIC:  Grossly normal. PELVIC:  External genitalia is atrophic.  The vagina shows a large cystocele and a large rectocele with loss of urethrovesical angle. There are atrophic changes.  The vaginal vault is well suspended however.  Cervix is absent.  Uterus is absent.  Adnexa, no masses and rectovaginal exam confirms.  ASSESSMENT: 1. Symptomatic pelvic relaxation with increasing stress urinary     incontinence.  The patient reports that her symptoms have     progressed to the point that she has a "urine" smell and that she     cannot stop her bladder from leaking. 2. Hypertension. 3. Diabetes. 4. Gout. 5. Osteoarthritis. 6. Obesity.  PLAN:  The patient will undergo an anterior and posterior colporrhaphy with a tension-free vaginal tape placement.  She will  also have cystoscopy.  She understands the indications for her surgical procedure as well as her alternative treatment options.  We have discussed the concerns about mesh erosion.  She has been informed of the [facility] website. She accepts the risks, but not limited to, anesthetic complications, bleeding, infection, and possible damage to surrounding organs.  We will give the patient subcutaneous heparin as well as sequential compression devices for venous thrombosis prophylaxis.     Eli Hose, M.D.     AVS/MEDQ  D:  05/16/2011  T:  05/16/2011  Job:  [id number]  cc:   Barrie Folk. Berdine Addison, MD Fax: [phone]

## 2011-05-17 NOTE — Op Note (Signed)
Preop Diagnosis: SUI  Postop Diagnosis: SUI  Procedure:1.TVT 2. Cystoscopy  Complications:none  Procedure: While the patient was in day surgery prior to starting the procedure, the risk benefits and alternatives were discussed with the patient including but not limited to bleeding, infection, injury, urinary retention and erosion of mesh. The patient was taken to the operating room where Dr. Raphael Gibney performed his portion of the surgery. I then proceeded to perform a TVT and cystoscopy. The anterior vaginal wall was already incised and this area was incised further rostrally. The underlying tissue was dissected away down to the level of the lower symphysis pubis bilaterally. Attention was then turned to the mons pubis where two 5 mm incisions were made 2 fingerbreadths from the midline. The transabdominal guide was then passed through the mons pubis incision on the patient's right down through the space of Retzius and out through the anterior vaginal wall after deflecting the rigid urethral catheter guide to the ipsilateral side. The same was done on the contralateral side. Cystoscopy was performed and no invadvertant bladder injury was noted. The bladder was drained with a Foley while deflecting the rigid urethral catheter guide to the patient's right and the mesh was attached to the transabdominal guide and elevated up through the space of Retzius and out through the incision on the mons pubis on the ipsilateral side. The same was done on the contralateral side. Cystoscopy was performed again and no inadvertant bladder injury was noted. The 37 French Foley was left in the urethra and a large Claiborne Billings was placed between the urethra and the mesh in order to leave the mesh slack beneath the midurethra. The mesh was then cut flush with the skin at the mons pubis incisions bilaterally. Indigo carmine had been administered, cystoscopy was performed again and bilateral ureters were noted to efflux without  difficulty. The bilateral incisions on the mons pubis were then cleaned and Dermabond applied. The remainder of the procedure was completed by Dr. Raphael Gibney.

## 2011-05-17 NOTE — Anesthesia Procedure Notes (Addendum)
Spinal Block  Patient location during procedure: OR Staffing Anesthesiologist: Riccardo Dubin Preanesthetic Checklist Completed: patient identified, site marked, surgical consent, pre-op evaluation, timeout performed, IV checked, risks and benefits discussed and monitors and equipment checked Spinal Block Patient position: sitting Prep: DuraPrep Patient monitoring: heart rate, cardiac monitor, continuous pulse ox and blood pressure Approach: midline Location: L3-4 Injection technique: single-shot Needle Needle type: Sprotte  Needle gauge: 24 G Needle length: 9 cm Assessment Sensory level: T8 Additional Notes Spinal Dosage in OR  Bupivicaine ml       1.4 PFMS04   mcg       100

## 2011-05-17 NOTE — Op Note (Signed)
NAMESOLANGE, Bradford NO.:  [id number]  MEDICAL RECORD NO.:  [id number]  LOCATION:  9309                          FACILITY:  [facility]  PHYSICIAN:  Eli Hose, M.D.DATE OF BIRTH:  1930/11/20  DATE OF PROCEDURE:  05/17/2011 DATE OF DISCHARGE:                              OPERATIVE REPORT   PREOPERATIVE DIAGNOSES: 1. Symptomatic pelvic relaxation. 2. Stress urinary incontinence. 3. Hypertension. 4. Diabetes. 5. Obesity.  POSTOPERATIVE DIAGNOSES: 1. Symptomatic pelvic relaxation. 2. Stress urinary incontinence. 3. Hypertension. 4. Diabetes. 5. Obesity.  PROCEDURES: 1. Anterior colporrhaphy. 2. Tension-free vaginal tape. 3. Cystoscopy.  CO-SURGEONS: 1. Eli Hose, MD 2. Everett Graff, MD  ANESTHETIC:  Spinal.  DISPOSITION:  [name] is a 77 year old female, para 6-0-0-6, who presents with the above-mentioned diagnoses.  The patient is status post abdominal hysterectomy and bilateral salpingo-oophorectomy for endometrial cancer in 1992.  The patient reports worsening symptoms that have not been relieved with Kegel exercises, pessary use, or any other outpatient management options.  She is ready to proceed with the surgical approach.  She understands the indications for her surgical procedure and she accepts the risks of, but not limited to, anesthetic complications, bleeding, infections, possible damage to the surrounding organs, and possible urinary retention.  FINDINGS:  The patient was noted to have a moderate to large cystocele. On examination under anesthesia, the rectocele was only noted to be small.  She did have loss of the urethrovesical angle.  No adnexal masses were present.  On cystoscopy, there were no masses noted in the bladder.  There was no evidence of damage to the bladder and in particular there was no evidence of mass visible within the bladder. Blue dye quickly passed through both ureteral orifices.  PROCEDURE:   The patient was taken to the operating room where a spinal anesthetic was given.  The patient's lower abdomen, perineum, and vagina were prepped with multiple layers of Betadine.  A Foley catheter was placed in the bladder.  The patient was then sterilely draped.  The patient was placed in a lithotomy position.  The anterior vaginal mucosa was injected with 10 mL of 0.5% Marcaine with epinephrine.  An incision was made in the midline and the vaginal mucosa was bluntly and sharply dissected from the underlying bladder and support tissue.  The incision was extended to the urethrovesical angle.  We then reapproximated the fascia supporting the bladder in the midline.  2-0 chromic and 0 chromic were the suture materials used.  Hemostasis was thought to be adequate. At this point, Dr. Mancel Bale began the TVT procedure.  She will dictate that portion of the note.  After the TVT had been placed by Dr. Mancel Bale, she performed cystoscopy and there was no evidence of damage to the bladder.  The small incisions in the suprapubic area were closed using Dermabond.  Dr. Raphael Gibney then trimmed the excess vaginal mucosa.  The vaginal mucosa was then reapproximated in the midline using 0 Vicryl figure-of-eight sutures.  Hemostasis was noted to be adequate throughout.  There was noted to be good support to the bladder at this point with good reapproximation of the urethrovesical angle.  The vagina was packed  with 1-inch gauze.  The patient was returned to the supine position.  She tolerated her procedure well.  The estimated blood loss for the procedure was 50 mL.  Sponge, needle, and instrument counts were correct.  There were no path specimens.  The patient was noted to drain blue-tinged urine at the end of her procedure.  She was transported to the recovery room in stable condition.     Eli Hose, M.D.     AVS/MEDQ  D:  05/17/2011  T:  05/17/2011  Job:  [id number]  cc:   Barrie Folk. Berdine Addison,  MD Fax: [phone]

## 2011-05-17 NOTE — Anesthesia Postprocedure Evaluation (Signed)
  Anesthesia Post-op Note  Patient: Courtney Bradford  Procedure(s) Performed:  ANTERIOR (CYSTOCELE) AND POSTERIOR REPAIR (RECTOCELE) - Anterior repair with TVT bladder sling and cystoscopy; TRANSVAGINAL TAPE (TVT) PROCEDURE  Patient is awake, responsive, moving her legs, and has signs of resolution of her numbness. Pain and nausea are reasonably well controlled. Vital signs are stable and clinically acceptable. Oxygen saturation is clinically acceptable. There are no apparent anesthetic complications at this time. Patient is ready for discharge.

## 2011-05-17 NOTE — Progress Notes (Signed)
Dr. Raphael Gibney in to see pt. Cardiology consult placed and pending. Pt. Remains without c/o at this time

## 2011-05-18 ENCOUNTER — Other Ambulatory Visit: Payer: Self-pay

## 2011-05-18 LAB — BASIC METABOLIC PANEL
BUN: 20 mg/dL (ref 6–23)
Chloride: 102 mEq/L (ref 96–112)
Glucose, Bld: 123 mg/dL — ABNORMAL HIGH (ref 70–99)
Potassium: 4.4 mEq/L (ref 3.5–5.1)

## 2011-05-18 LAB — CBC
HCT: 29.7 % — ABNORMAL LOW (ref 36.0–46.0)
Hemoglobin: 9.5 g/dL — ABNORMAL LOW (ref 12.0–15.0)
MCH: 28.8 pg (ref 26.0–34.0)
MCHC: 32 g/dL (ref 30.0–36.0)

## 2011-05-18 LAB — COMPREHENSIVE METABOLIC PANEL
ALT: 8 U/L (ref 0–35)
AST: 22 U/L (ref 0–37)
CO2: 30 mEq/L (ref 19–32)
Chloride: 100 mEq/L (ref 96–112)
Creatinine, Ser: 1.26 mg/dL — ABNORMAL HIGH (ref 0.50–1.10)
GFR calc non Af Amer: 41 mL/min — ABNORMAL LOW (ref >60)
Sodium: 137 mEq/L (ref 135–145)
Total Bilirubin: 0.6 mg/dL (ref 0.3–1.2)

## 2011-05-18 NOTE — Progress Notes (Signed)
Subjective:  With otu complaint  Objective:  Vital Signs in the last 24 hours: Temp:  [97.5 F (36.4 C)-98.5 F (36.9 C)] 98.5 F (36.9 C) (07/19 0800) Pulse Rate:  [46-109] 59  (07/19 0800) Resp:  [11-20] 17  (07/19 0900) BP: (82-183)/(41-74) 141/59 mmHg (07/19 1028) SpO2:  [98 %-100 %] 100 % (07/19 0800) Weight:  [164 lb (74.39 kg)] 164 lb (74.39 kg) (07/18 1600)  Intake/Output from previous day: 07/18 0701 - 07/19 0700 In: 3571.7 [P.O.:390; I.V.:3181.7] Out: 4525 [Urine:4475; Blood:50] Intake/Output from this shift: I/O this shift: In: -  Out: 200 [Urine:200]  Physical Exam: General appearance: alert, cooperative and no distress Lungs: clear to auscultation bilaterally Heart: regular rate and rhythm and no S3 or S4 Abdomen: soft, non-tender; bowel sounds normal; no masses,  no organomegaly Extremities: no edema, redness or tenderness in the calves or thighs  Lab Results:  [facility] 05/18/11 0505  WBC 7.5  HGB 9.5*  PLT 213    Basename 05/18/11 0505 05/18/11 0005  NA 136 137  K 4.4 4.2  CL 102 100  CO2 27 30  GLUCOSE 123* 129*  BUN 20 19  CREATININE 1.19* 1.26*    Basename 05/18/11 0005  TROPONINI <0.30   Hepatic Function Panel  Basename 05/18/11 0005  PROT 7.1  ALBUMIN 3.2*  AST 22  ALT 8  ALKPHOS 61  BILITOT 0.6  BILIDIR --  IBILI --   No results found for this basename: CHOL in the last 72 hours No results found for this basename: PROTIME in the last 72 hours  Imaging: prev 3echo was normal   Cardiac Studies: tele demonstrates 2AVB1 occurring mostly at night  Assessment/Plan:  2AVB 1   thsi is likely 2/2 to vagal enhancement and as suggested previoulsy may be 2/2 pain, sleep apnea;  No evidence of ischemia and enzymes are negative.   I would  Be ok with discharge and would utilize 30day [facility] monitoring as outpt. [phone]) with outpt followup with Dr Verl Blalock in 4-5 weeks   LOS: 1 day    Virl Axe 05/18/2011, 11:02 AM

## 2011-05-18 NOTE — Progress Notes (Signed)
Dr. Aundra Dubin called for update - VS, EKG reviewed. Pt asymptomatic - no c/o chest pain, SOB, dizziness.   No orders rec'd.  Cardiology will be in for further assessment in the AM.

## 2011-05-18 NOTE — Progress Notes (Signed)
Dr. Simonne Maffucci in to assess pt, plan of care discussed with pt and family.  Pt alert and responsive, denies chest pain, SOB, dizziness.  Moving well, denies post op pain.

## 2011-05-18 NOTE — Consult Note (Signed)
Courtney Bradford, Courtney Bradford NO.:  [id number]  MEDICAL RECORD NO.:  [id number]  LOCATION:  [id number]                          FACILITY:  [facility]  PHYSICIAN:  Cristopher Estimable. Lattie Haw, MD, FACCDATE OF BIRTH:  1931-01-29  DATE OF CONSULTATION:  05/17/2011 DATE OF DISCHARGE:                                CONSULTATION   PRIMARY CARE PHYSICIAN:  Barrie Folk. Berdine Addison, MD  PRIMARY CARDIOLOGIST:  Marijo Conception. Wall, MD, FACC  HISTORY OF PRESENT ILLNESS:  This is a 77 year old woman who is referred for bradycardia occurring following surgery to correct urinary incontinence.  Ms. Courtney Bradford was previously evaluated by Dr. Verl Blalock approximately 18 months ago for PVCs and palpitations.  She has multiple cardiovascular risk factors including diabetes and hypertension, but has no known vascular disease.  An echocardiogram showed borderline LVH with normal left ventricular systolic function and no significant valvular abnormalities.  A lipid profile demonstrated an excellent response to therapy as did her hemoglobin A1c level of 5.2.  No further cardiac testing was thought to be warranted nor was routine cardiology followup planned.  The patient denies any chest discomfort, dyspnea, orthopnea, or PND. She has had no lightheadedness and no syncope.  She has not recently complained of palpitations.  She has not been hospitalized nor required care in the emergency department in recent years, except for the present elective surgery.  PAST MEDICAL HISTORY:  Notable for adenocarcinoma of the endometrium in 1992 for which a total abdominal hysterectomy with bilateral salpingo- oophorectomy was performed and excisional biopsy of the breast for benign disease.  She has history of gout, osteoarthritis, and obesity.  OUTPATIENT MEDICATIONS: 1. Tekturna HCT 150/12.5 mg daily. 2. Benazepril/amlodipine 20/5 mg daily. 3. Clonidine 0.1 mg b.i.d. 4. Calcium and vitamin D. 5. Iron 325 mg daily. 6. Humulin N 10 units  q.a.m.  SOCIAL HISTORY:  No use of tobacco products nor alcohol.  REVIEW OF SYSTEMS:  Urinary incontinence without response to conservative therapy; 6 vaginal deliveries of normal infants; all other systems reviewed and are negative.  PHYSICAL EXAMINATION:  GENERAL:  Pleasant older woman resting comfortably in bed and eating a full supper without apparent distress. VITAL SIGNS:  Temperature is 98.0, heart rate 59 and regular, respirations 13, blood pressure 125/65, and O2 saturation 100% on 2 L. HEENT:  EOMs full; normal lids and conjunctivae; normal oral mucosa. NECK:  No jugular venous distention; normal carotid upstrokes without bruits. ENDOCRINE:  No thyromegaly. HEMATOPOETIC:  No adenopathy. LUNGS:  Few bibasilar rales. CARDIAC:  Normal first and second heart sounds. ABDOMEN:  Soft and nontender; normal bowel sounds; no masses; no organomegaly. EXTREMITIES:  Normal distal pulses; no edema. NEUROLOGIC:  Symmetric strength and tone; normal cranial nerves. SKIN:  No significant lesions. PSYCHIATRIC:  Alert and oriented; normal affect. ENDOCRINE:  No thyromegaly.  LABORATORY DATA:  EKG:  Pending.  Rhythm strips:  Sinus rhythm and sinus bradycardia with occasional type 1 second-degree AV block.  Only recent laboratory available is CBGs of 112-155, a CBC from 6 days ago showing moderate anemia with a hemoglobin of 10.6 and a normal MCV. Platelets and white count are normal.  EKG from May 11, 2011 shows normal sinus  rhythm, prominent QRS voltage, and no other significant abnormalities.  IMPRESSION:  Sinus bradycardia and second-degree atrioventricular block has occurred following gynecologic surgery.  Increased vagal tone may be contributing to these arrhythmias.  She has not had known conduction system disease in the past and is on no medications likely to exacerbate any underlying conduction disturbance.  As long as she remains asymptomatic, intervention is probably not  necessary.  Should she develops severe or symptomatic bradycardia, she can be treated with atropine.  Clonidine has occasionally been associated with sinus bradycardia, but I doubt that is a factor in Ms. Flacks' case.  [facility] will be happy to follow this nice woman in hospital and to see her after discharge.  A repeat EKG will be obtained in the morning as well as cardiac markers to exclude ischemia or infarction, which appears unlikely.  A chemistry profile, magnesium level, and TSH level will also be obtained.     Cristopher Estimable. Lattie Haw, MD, [facility]     RMR/MEDQ  D:  05/17/2011  T:  05/18/2011  Job:  [id number]

## 2011-05-18 NOTE — Progress Notes (Signed)
Discussed EKG strip, V/S  with ELINK.Marland Kitchen  Pt resting quietly/dozing with no c/o chest discomfort, SOB, dizziness.  Alert and responsive.

## 2011-05-18 NOTE — Progress Notes (Signed)
UR chart review completed.  

## 2011-05-18 NOTE — Progress Notes (Signed)
Subjective: Patient reports tolerating PO, + flatus and no problems voiding.    Objective: I have reviewed patient's vital signs, intake and output and labs.  General: alert, cooperative and no distress Resp: clear to auscultation bilaterally Cardio: regularly irregular rhythm GI: soft, non-tender; bowel sounds normal; no masses,  no organomegaly Extremities: extremities normal, atraumatic, no cyanosis or edema Vaginal Bleeding: none   Assessment/Plan: Post Op Day 1 Voiding Well Anemia Continue voiding trial Continue cardiac evaluation Possible discharge tomorrow  LOS: 1 day    Phillipa Morden V 05/18/2011, 8:33 AM

## 2011-05-19 MED ORDER — ONDANSETRON HCL 4 MG PO TABS: 4.0000 mg | ORAL_TABLET | Freq: Four times a day (QID) | ORAL | Status: AC | PRN

## 2011-05-19 MED ORDER — BISACODYL 5 MG PO TBEC: 5.0000 mg | DELAYED_RELEASE_TABLET | Freq: Every day | ORAL | Status: AC | PRN

## 2011-05-19 MED ORDER — ACETAMINOPHEN 325 MG PO TABS: 650.0000 mg | ORAL_TABLET | Freq: Four times a day (QID) | ORAL | Status: AC | PRN

## 2011-05-19 MED ORDER — HYDROCODONE-ACETAMINOPHEN 5-325 MG PO TABS: 1.0000 | ORAL_TABLET | ORAL | Status: AC | PRN

## 2011-05-19 NOTE — Discharge Summary (Signed)
Physician Discharge Summary  Patient ID: Courtney Bradford MRN: [id number] DOB/AGE: 75-Nov-1932 75 y.o.  Admit date: 05/17/2011 Discharge date: 05/19/2011  Admission Diagnoses:SUI, Pelvic Relaxation, HTN, DM, Obesity, Anemia  Discharge Diagnoses:SUI, Pelvic Relaxation, HTN, DM, Obesity, Anemia, Bradycardia, Second degree heart block  Active Problems:  SUI (stress urinary incontinence, female)  Pelvic relaxation   Procedures this Admission: May 17, 2011  Anterior Repair, TVT, Cystoscopy  Discharged Condition: stable  Admission Hx and PE: The patient has been followed at the [facility].  She has SUI that has not responded to outpatient care.  See admission history.  Hospital Course: On the date of admission that patient had an anterior repair, TVT, and cystoscopy.  She tolerated her procedure well.  She had bradycardia and second degree heart block post op.  She was seen by Cardiology.  Testing did not show signs of an MI.  She voided well and there was no residual urine in her bladder.  She remained afebrile.  She was discharged on day 2 in stable and improved condition. Hgb 9.5.  Continue  Hep 5000 units twice each day for 10 days.  Consults: cardiology  Disposition: Home  Discharge Orders    Future Orders Please Complete By Expires   Diet - low sodium heart healthy      Discharge instructions      Increase activity slowly      May shower / Bathe      Driving Restrictions      Comments:   No driving for two weeks.   Lifting restrictions      Comments:   No heavy lifting for four weeks.   Sexual Activity Restrictions      Comments:   No intercourse for six weeks.   No dressing needed      Call MD for:  temperature >100.4      Call MD for:  persistant nausea and vomiting      Call MD for:  severe uncontrolled pain      Call MD for:  redness, tenderness, or signs of infection (pain, swelling, redness, odor or  green/yellow discharge around incision site)      Discharge instructions      Comments:   Please see printed instructions     Current Discharge Medication List    START taking these medications   Details  acetaminophen (TYLENOL) 325 MG tablet Take 2 tablets (650 mg total) by mouth every 6 (six) hours as needed. Qty: 200 tablet, Refills: 1    bisacodyl (DULCOLAX) 5 MG EC tablet Take 1 tablet (5 mg total) by mouth daily as needed for constipation. Qty: 120 tablet, Refills: 1    HYDROcodone-acetaminophen (NORCO) 5-325 MG per tablet Take 1-2 tablets by mouth every 4 (four) hours as needed. Qty: 30 tablet, Refills: 1    ondansetron (ZOFRAN) 4 MG tablet Take 1 tablet (4 mg total) by mouth every 6 (six) hours as needed for nausea. Qty: 20 tablet, Refills: 1      CONTINUE these medications which have NOT CHANGED   Details  aliskiren (TEKTURNA) 150 MG tablet Take 150 mg by mouth daily.     amLODipine (NORVASC) 5 MG tablet Take 5 mg by mouth daily.      cloNIDine (CATAPRES) 0.1 MG tablet Take 0.1 mg by mouth 2 (two) times daily.      insulin regular (HUMULIN R) 100 units/mL injection Inject 10 Units into the skin every morning.  Olopatadine HCl 0.2 % SOLN Place 1 drop into both eyes daily.      insulin NPH (HUMULIN N,NOVOLIN N) 100 UNIT/ML injection Inject 10 Units into the skin every morning.           SignedEli Hose 05/19/2011, 10:02 AM

## 2011-05-19 NOTE — Progress Notes (Signed)
2 Days Post-Op Procedure(s) (LRB): ANTERIOR (CYSTOCELE) AND POSTERIOR REPAIR (RECTOCELE) (N/A) TRANSVAGINAL TAPE (TVT) PROCEDURE (N/A)  Subjective: Patient reports + flatus and no problems voiding.    Objective: I have reviewed patient's vital signs, intake and output, medications and labs.  Good voids.  No residuals.  General: alert, cooperative and no distress Resp: clear to auscultation bilaterally Cardio: regular rate and rhythm GI: soft, non-tender; bowel sounds normal; no masses,  no organomegaly Extremities: extremities normal, atraumatic, no cyanosis or edema Vaginal Bleeding: none  Assessment: s/p Procedure(s): ANTERIOR (CYSTOCELE) AND POSTERIOR REPAIR (RECTOCELE) TRANSVAGINAL TAPE (TVT) PROCEDURE: stable, progressing well and tolerating diet  Plan: Discharge home Cardiology will follow as an outpatient.   LOS: 2 days    Josealfredo Adkins V 05/19/2011, 9:58 AM

## 2011-05-26 ENCOUNTER — Telehealth: Payer: Self-pay

## 2011-05-27 ENCOUNTER — Emergency Department (HOSPITAL_COMMUNITY)
Admission: EM | Admit: 2011-05-27 | Discharge: 2011-05-27 | Disposition: A | Discharge status: Home / Self Care | Payer: PRIVATE HEALTH INSURANCE | Attending: Emergency Medicine | Admitting: Emergency Medicine

## 2011-05-27 DIAGNOSIS — Z79899 Other long term (current) drug therapy: Secondary | ICD-10-CM | POA: Insufficient documentation

## 2011-05-27 DIAGNOSIS — E119 Type 2 diabetes mellitus without complications: Secondary | ICD-10-CM | POA: Insufficient documentation

## 2011-05-27 DIAGNOSIS — R059 Cough, unspecified: Secondary | ICD-10-CM | POA: Insufficient documentation

## 2011-05-27 DIAGNOSIS — R509 Fever, unspecified: Secondary | ICD-10-CM | POA: Insufficient documentation

## 2011-05-27 DIAGNOSIS — M542 Cervicalgia: Secondary | ICD-10-CM | POA: Insufficient documentation

## 2011-05-27 DIAGNOSIS — I10 Essential (primary) hypertension: Secondary | ICD-10-CM | POA: Insufficient documentation

## 2011-05-27 DIAGNOSIS — R05 Cough: Secondary | ICD-10-CM | POA: Insufficient documentation

## 2011-05-27 DIAGNOSIS — N390 Urinary tract infection, site not specified: Secondary | ICD-10-CM | POA: Insufficient documentation

## 2011-05-27 DIAGNOSIS — Z794 Long term (current) use of insulin: Secondary | ICD-10-CM | POA: Insufficient documentation

## 2011-05-27 DIAGNOSIS — M129 Arthropathy, unspecified: Secondary | ICD-10-CM | POA: Insufficient documentation

## 2011-05-27 DIAGNOSIS — R51 Headache: Secondary | ICD-10-CM | POA: Insufficient documentation

## 2011-05-27 LAB — DIFFERENTIAL
Basophils Absolute: 0 10*3/uL (ref 0.0–0.1)
Basophils Relative: 0 % (ref 0–1)
Eosinophils Relative: 1 % (ref 0–5)
Monocytes Absolute: 0.8 10*3/uL (ref 0.1–1.0)

## 2011-05-27 LAB — URINALYSIS, ROUTINE W REFLEX MICROSCOPIC
Ketones, ur: NEGATIVE mg/dL
Protein, ur: 30 mg/dL — AB
Urobilinogen, UA: 1 mg/dL (ref 0.0–1.0)

## 2011-05-27 LAB — BASIC METABOLIC PANEL
CO2: 25 mEq/L (ref 19–32)
Calcium: 10.3 mg/dL (ref 8.4–10.5)
Chloride: 106 mEq/L (ref 96–112)
Glucose, Bld: 110 mg/dL — ABNORMAL HIGH (ref 70–99)
Sodium: 140 mEq/L (ref 135–145)

## 2011-05-27 LAB — CBC
MCHC: 32.9 g/dL (ref 30.0–36.0)
RDW: 12.3 % (ref 11.5–15.5)

## 2011-05-27 LAB — URINE MICROSCOPIC-ADD ON

## 2011-05-30 LAB — URINE CULTURE
Colony Count: >=100000
Culture  Setup Time: 201207291141

## 2011-05-30 NOTE — Telephone Encounter (Signed)
Tried to call patient  On Tuesday for monitor   And could not leaver messege

## 2011-06-05 ENCOUNTER — Telehealth: Payer: Self-pay

## 2011-06-05 NOTE — Telephone Encounter (Signed)
Patient said the Dr. She talk to said he don"t think she need this monitor.

## 2011-06-06 ENCOUNTER — Encounter (HOSPITAL_COMMUNITY): Payer: Self-pay | Admitting: Obstetrics and Gynecology

## 2011-07-12 NOTE — Progress Notes (Signed)
Encounter addended by: Laverle Hobby on: 07/12/2011 11:33 AM<BR>     Documentation filed: Charges VN

## 2011-08-21 ENCOUNTER — Other Ambulatory Visit (HOSPITAL_COMMUNITY): Payer: Self-pay | Admitting: Family Medicine

## 2011-08-21 DIAGNOSIS — Z139 Encounter for screening, unspecified: Secondary | ICD-10-CM

## 2011-09-04 ENCOUNTER — Ambulatory Visit (HOSPITAL_COMMUNITY)
Admission: RE | Admit: 2011-09-04 | Discharge: 2011-09-04 | Disposition: A | Discharge status: Home / Self Care | Payer: PRIVATE HEALTH INSURANCE | Source: Ambulatory Visit | Attending: Family Medicine | Admitting: Family Medicine

## 2011-09-04 DIAGNOSIS — Z1231 Encounter for screening mammogram for malignant neoplasm of breast: Secondary | ICD-10-CM | POA: Insufficient documentation

## 2011-09-04 DIAGNOSIS — Z139 Encounter for screening, unspecified: Secondary | ICD-10-CM

## 2011-09-12 ENCOUNTER — Other Ambulatory Visit: Payer: Self-pay | Admitting: Family Medicine

## 2011-09-12 DIAGNOSIS — R928 Other abnormal and inconclusive findings on diagnostic imaging of breast: Secondary | ICD-10-CM

## 2011-09-28 ENCOUNTER — Ambulatory Visit
Admission: RE | Admit: 2011-09-28 | Discharge: 2011-09-28 | Disposition: A | Discharge status: Home / Self Care | Payer: PRIVATE HEALTH INSURANCE | Source: Ambulatory Visit | Attending: Family Medicine | Admitting: Family Medicine

## 2011-09-28 DIAGNOSIS — R928 Other abnormal and inconclusive findings on diagnostic imaging of breast: Secondary | ICD-10-CM

## 2012-03-11 ENCOUNTER — Other Ambulatory Visit (HOSPITAL_COMMUNITY): Payer: Self-pay | Admitting: Family Medicine

## 2012-03-11 DIAGNOSIS — Z09 Encounter for follow-up examination after completed treatment for conditions other than malignant neoplasm: Secondary | ICD-10-CM

## 2012-04-03 ENCOUNTER — Ambulatory Visit (HOSPITAL_COMMUNITY)
Admission: RE | Admit: 2012-04-03 | Discharge: 2012-04-03 | Disposition: A | Discharge status: Home / Self Care | Payer: PRIVATE HEALTH INSURANCE | Source: Ambulatory Visit | Attending: Family Medicine | Admitting: Family Medicine

## 2012-04-03 DIAGNOSIS — R928 Other abnormal and inconclusive findings on diagnostic imaging of breast: Secondary | ICD-10-CM | POA: Insufficient documentation

## 2012-04-03 DIAGNOSIS — Z09 Encounter for follow-up examination after completed treatment for conditions other than malignant neoplasm: Secondary | ICD-10-CM | POA: Insufficient documentation

## 2014-07-21 ENCOUNTER — Inpatient Hospital Stay (HOSPITAL_COMMUNITY)
Admission: EM | Admit: 2014-07-21 | Discharge: 2014-07-31 | DRG: 553 | Disposition: A | Discharge status: Home / Self Care | Payer: PRIVATE HEALTH INSURANCE | Attending: Internal Medicine | Admitting: Internal Medicine

## 2014-07-21 ENCOUNTER — Encounter (HOSPITAL_COMMUNITY): Payer: Self-pay | Admitting: Emergency Medicine

## 2014-07-21 ENCOUNTER — Emergency Department (HOSPITAL_COMMUNITY): Payer: PRIVATE HEALTH INSURANCE

## 2014-07-21 DIAGNOSIS — I482 Chronic atrial fibrillation, unspecified: Secondary | ICD-10-CM

## 2014-07-21 DIAGNOSIS — Z87440 Personal history of urinary (tract) infections: Secondary | ICD-10-CM

## 2014-07-21 DIAGNOSIS — M109 Gout, unspecified: Principal | ICD-10-CM | POA: Diagnosis present

## 2014-07-21 DIAGNOSIS — E119 Type 2 diabetes mellitus without complications: Secondary | ICD-10-CM | POA: Diagnosis present

## 2014-07-21 DIAGNOSIS — N390 Urinary tract infection, site not specified: Secondary | ICD-10-CM | POA: Diagnosis present

## 2014-07-21 DIAGNOSIS — E876 Hypokalemia: Secondary | ICD-10-CM | POA: Diagnosis present

## 2014-07-21 DIAGNOSIS — M79605 Pain in left leg: Secondary | ICD-10-CM | POA: Diagnosis present

## 2014-07-21 DIAGNOSIS — M179 Osteoarthritis of knee, unspecified: Secondary | ICD-10-CM | POA: Diagnosis present

## 2014-07-21 DIAGNOSIS — I509 Heart failure, unspecified: Secondary | ICD-10-CM

## 2014-07-21 DIAGNOSIS — I495 Sick sinus syndrome: Secondary | ICD-10-CM | POA: Diagnosis present

## 2014-07-21 DIAGNOSIS — I129 Hypertensive chronic kidney disease with stage 1 through stage 4 chronic kidney disease, or unspecified chronic kidney disease: Secondary | ICD-10-CM | POA: Diagnosis present

## 2014-07-21 DIAGNOSIS — N393 Stress incontinence (female) (male): Secondary | ICD-10-CM | POA: Diagnosis present

## 2014-07-21 DIAGNOSIS — E1129 Type 2 diabetes mellitus with other diabetic kidney complication: Secondary | ICD-10-CM | POA: Diagnosis present

## 2014-07-21 DIAGNOSIS — I480 Paroxysmal atrial fibrillation: Secondary | ICD-10-CM

## 2014-07-21 DIAGNOSIS — M79604 Pain in right leg: Secondary | ICD-10-CM | POA: Diagnosis present

## 2014-07-21 DIAGNOSIS — D509 Iron deficiency anemia, unspecified: Secondary | ICD-10-CM | POA: Diagnosis present

## 2014-07-21 DIAGNOSIS — N179 Acute kidney failure, unspecified: Secondary | ICD-10-CM | POA: Diagnosis present

## 2014-07-21 DIAGNOSIS — D649 Anemia, unspecified: Secondary | ICD-10-CM | POA: Diagnosis present

## 2014-07-21 DIAGNOSIS — R1110 Vomiting, unspecified: Secondary | ICD-10-CM | POA: Diagnosis present

## 2014-07-21 DIAGNOSIS — I4892 Unspecified atrial flutter: Secondary | ICD-10-CM

## 2014-07-21 DIAGNOSIS — I5031 Acute diastolic (congestive) heart failure: Secondary | ICD-10-CM

## 2014-07-21 DIAGNOSIS — I4819 Other persistent atrial fibrillation: Secondary | ICD-10-CM

## 2014-07-21 DIAGNOSIS — Z794 Long term (current) use of insulin: Secondary | ICD-10-CM

## 2014-07-21 DIAGNOSIS — I10 Essential (primary) hypertension: Secondary | ICD-10-CM | POA: Diagnosis present

## 2014-07-21 DIAGNOSIS — I5033 Acute on chronic diastolic (congestive) heart failure: Secondary | ICD-10-CM

## 2014-07-21 DIAGNOSIS — N183 Chronic kidney disease, stage 3 (moderate): Secondary | ICD-10-CM | POA: Diagnosis present

## 2014-07-21 DIAGNOSIS — T501X5A Adverse effect of loop [high-ceiling] diuretics, initial encounter: Secondary | ICD-10-CM | POA: Diagnosis present

## 2014-07-21 DIAGNOSIS — R0602 Shortness of breath: Secondary | ICD-10-CM

## 2014-07-21 DIAGNOSIS — I5032 Chronic diastolic (congestive) heart failure: Secondary | ICD-10-CM | POA: Diagnosis present

## 2014-07-21 DIAGNOSIS — R509 Fever, unspecified: Secondary | ICD-10-CM

## 2014-07-21 LAB — CBC WITH DIFFERENTIAL/PLATELET
BASOS PCT: 0 % (ref 0–1)
Basophils Absolute: 0 10*3/uL (ref 0.0–0.1)
Eosinophils Absolute: 0 10*3/uL (ref 0.0–0.7)
Eosinophils Relative: 0 % (ref 0–5)
HCT: 30.7 % — ABNORMAL LOW (ref 36.0–46.0)
Hemoglobin: 10.1 g/dL — ABNORMAL LOW (ref 12.0–15.0)
LYMPHS PCT: 9 % — AB (ref 12–46)
Lymphs Abs: 1 10*3/uL (ref 0.7–4.0)
MCH: 28.6 pg (ref 26.0–34.0)
MCHC: 32.9 g/dL (ref 30.0–36.0)
MCV: 87 fL (ref 78.0–100.0)
Monocytes Absolute: 1.2 10*3/uL — ABNORMAL HIGH (ref 0.1–1.0)
Monocytes Relative: 11 % (ref 3–12)
NEUTROS ABS: 9.1 10*3/uL — AB (ref 1.7–7.7)
NEUTROS PCT: 80 % — AB (ref 43–77)
Platelets: 270 10*3/uL (ref 150–400)
RBC: 3.53 MIL/uL — ABNORMAL LOW (ref 3.87–5.11)
RDW: 12.4 % (ref 11.5–15.5)
WBC: 11.3 10*3/uL — AB (ref 4.0–10.5)

## 2014-07-21 MED ORDER — SODIUM CHLORIDE 0.9 % IV BOLUS (SEPSIS)
1000.0000 mL | Freq: Once | INTRAVENOUS | Status: AC
  Administered 2014-07-22: 1000 mL via INTRAVENOUS

## 2014-07-21 MED ORDER — ACETAMINOPHEN 500 MG PO TABS
1000.0000 mg | ORAL_TABLET | Freq: Once | ORAL | Status: AC
  Administered 2014-07-22: 1000 mg via ORAL
  Filled 2014-07-21: qty 2

## 2014-07-21 NOTE — ED Provider Notes (Signed)
CSN: [id number]     Arrival date & time 07/21/14  2157 History   First MD Initiated Contact with Patient 07/21/14 2258     Chief Complaint  Patient presents with  . Leg Pain     (Consider location/radiation/quality/duration/timing/severity/associated sxs/prior Treatment) HPI Courtney Bradford is a 80 y.o. female with a past medical history of hypertension and diabetes coming in with bilateral lower extremity pain. Patient states this occurred 3 days ago. She describes as being a soreness, it is worse behind her bilateral knees. She states her left leg is worse than her right. She can no longer walk on them. Nothing has made her pain better. She denies this occurring in the past. States her legs are also more swollen than normal and she denies any history of heart failure. She does admit to chills during the interval. She denies fevers or diaphoresis. Patient's denying any chest pain shortness of breath abdominal pain or changes in her bowel or bladder. She has no further complaints.  10 Systems reviewed and are negative for acute change except as noted in the HPI.     Past Medical History  Diagnosis Date  . Hypertension   . Diabetes mellitus   . Anemia   . Cataract   . Headache(784.0)   . Arthritis     right knee   Past Surgical History  Procedure Laterality Date  . Abdominal hysterectomy    . Breast surgery      breast biopsy  . Anterior and posterior repair  05/17/2011    Procedure: ANTERIOR (CYSTOCELE) AND POSTERIOR REPAIR (RECTOCELE);  Surgeon: Eli Hose, MD;  Location: [facility];  Service: Gynecology;  Laterality: N/A;  Anterior repair with TVT bladder sling and cystoscopy  . Bladder suspension  05/17/2011    Procedure: TRANSVAGINAL TAPE (TVT) PROCEDURE;  Surgeon: Eli Hose, MD;  Location: [facility];  Service: Gynecology;  Laterality: N/A;   History reviewed. No pertinent family history. History  Substance Use Topics  . Smoking status: Never Smoker   . Smokeless  tobacco: Not on file  . Alcohol Use: No   OB History   Grav Para Term Preterm Abortions TAB SAB Ect Mult Living                 Review of Systems    Allergies  Lactose intolerance (gi)  Home Medications   Prior to Admission medications   Medication Sig Start Date End Date Taking? Authorizing Provider  aliskiren (TEKTURNA) 150 MG tablet Take 150 mg by mouth daily.     Historical Provider, MD  amLODipine (NORVASC) 5 MG tablet Take 5 mg by mouth daily.      Historical Provider, MD  cloNIDine (CATAPRES) 0.1 MG tablet Take 0.1 mg by mouth 2 (two) times daily.      Historical Provider, MD  insulin NPH (HUMULIN N,NOVOLIN N) 100 UNIT/ML injection Inject 10 Units into the skin every morning.      Historical Provider, MD  insulin regular (HUMULIN R) 100 units/mL injection Inject 10 Units into the skin every morning.      Historical Provider, MD  Olopatadine HCl 0.2 % SOLN Place 1 drop into both eyes daily.      Historical Provider, MD   BP 166/48  Pulse 83  Temp(Src) 100.6 F (38.1 C) (Oral)  Resp 19  Wt 150 lb (68.04 kg)  SpO2 97% Physical Exam  Nursing note and vitals reviewed. Constitutional: She is oriented to person, place, and time. She  appears well-developed and well-nourished. No distress.  HENT:  Head: Normocephalic and atraumatic.  Nose: Nose normal.  Mouth/Throat: Oropharynx is clear and moist. No oropharyngeal exudate.  Eyes: Conjunctivae and EOM are normal. Pupils are equal, round, and reactive to light. No scleral icterus.  Neck: Normal range of motion. Neck supple. No JVD present. No tracheal deviation present. No thyromegaly present.  Cardiovascular: Normal rate.  Exam reveals no gallop and no friction rub.   No murmur heard. Irregularly irregular rhythm.  Pulmonary/Chest: Effort normal and breath sounds normal. No respiratory distress. She has no wheezes. She exhibits no tenderness.  Abdominal: Soft. Bowel sounds are normal. She exhibits no distension and no mass.  There is no tenderness. There is no rebound and no guarding.  Musculoskeletal: Normal range of motion. She exhibits edema and tenderness.  Bilateral lower extremities are warm to touch. There is 1+ edema to the knees. There are chronic venous stasis changes. No erythema seen. Legs are tender to palpation. No abnormalities palpated in the posterior knee.  Lymphadenopathy:    She has no cervical adenopathy.  Neurological: She is alert and oriented to person, place, and time. No cranial nerve deficit. She exhibits normal muscle tone. Coordination normal.  Skin: Skin is warm and dry. No rash noted. She is not diaphoretic. No erythema. No pallor.  It is warm to touch diffusely in her skin.    ED Course  Procedures (including critical care time) Labs Review Labs Reviewed  CBC WITH DIFFERENTIAL - Abnormal; Notable for the following:    WBC 11.3 (*)    RBC 3.53 (*)    Hemoglobin 10.1 (*)    HCT 30.7 (*)    Neutrophils Relative % 80 (*)    Neutro Abs 9.1 (*)    Lymphocytes Relative 9 (*)    Monocytes Absolute 1.2 (*)    All other components within normal limits  COMPREHENSIVE METABOLIC PANEL - Abnormal; Notable for the following:    Potassium 3.6 (*)    Glucose, Bld 118 (*)    BUN 37 (*)    Creatinine, Ser 1.12 (*)    Albumin 3.1 (*)    GFR calc non Af Amer 45 (*)    GFR calc Af Amer 52 (*)    Anion gap 17 (*)    All other components within normal limits  URINALYSIS, ROUTINE W REFLEX MICROSCOPIC - Abnormal; Notable for the following:    APPearance TURBID (*)    Hgb urine dipstick MODERATE (*)    Protein, ur 30 (*)    Leukocytes, UA LARGE (*)    All other components within normal limits  PRO B NATRIURETIC PEPTIDE - Abnormal; Notable for the following:    Pro B Natriuretic peptide (BNP) 989.3 (*)    All other components within normal limits  URINE MICROSCOPIC-ADD ON - Abnormal; Notable for the following:    Bacteria, UA MANY (*)    All other components within normal limits  T3,  FREE - Abnormal; Notable for the following:    T3, Free 2.0 (*)    All other components within normal limits  CBC - Abnormal; Notable for the following:    RBC 3.17 (*)    Hemoglobin 9.3 (*)    HCT 28.4 (*)    All other components within normal limits  BASIC METABOLIC PANEL - Abnormal; Notable for the following:    Glucose, Bld 107 (*)    BUN 33 (*)    GFR calc non Af Amer 47 (*)  GFR calc Af Amer 54 (*)    All other components within normal limits  HEPARIN LEVEL (UNFRACTIONATED) - Abnormal; Notable for the following:    Heparin Unfractionated 0.25 (*)    All other components within normal limits  GLUCOSE, CAPILLARY - Abnormal; Notable for the following:    Glucose-Capillary 109 (*)    All other components within normal limits  GLUCOSE, CAPILLARY - Abnormal; Notable for the following:    Glucose-Capillary 170 (*)    All other components within normal limits  GLUCOSE, CAPILLARY - Abnormal; Notable for the following:    Glucose-Capillary 119 (*)    All other components within normal limits  CULTURE, BLOOD (ROUTINE X 2)  CULTURE, BLOOD (ROUTINE X 2)  URINE CULTURE  LACTIC ACID, PLASMA  LIPASE, BLOOD  TROPONIN I  CK  MAGNESIUM  TSH  T4, FREE  HEPARIN LEVEL (UNFRACTIONATED)  HEPARIN LEVEL (UNFRACTIONATED)  CBC  BASIC METABOLIC PANEL    Imaging Review Dg Chest 2 View  07/22/2014   CLINICAL DATA:  LEG PAIN  EXAM: CHEST  2 VIEW  COMPARISON:  None.  FINDINGS: Mild cardiomegaly is present. Mediastinal silhouettes within normal limits.  The lungs are normally inflated. No airspace consolidation, pleural effusion, or pulmonary edema is identified. There is no pneumothorax.  No acute osseous abnormality identified.  IMPRESSION: 1. No acute cardiopulmonary abnormality. 2. Mild cardiomegaly.   Electronically Signed   By: Jeannine Boga M.D.   On: 07/22/2014 00:38     EKG Interpretation None    MUSE is not working, atrial fibrillation, rate 87, no ischemic changes, new since  last EKG performed.  A fib is new since last tracing.  MDM   Final diagnoses:  None    Patient presents emergency department out of concern for bilateral lower extremity pain. She has no history of blood clots or CHF. She's also has a fever. She was given 1 g of tylenol. We'll begin infectious workup including chest x-ray and urinalysis. If negative we'll likely treat for cellulitis. Anticipate admission as the patient is no longer able to ambulate and lives alone at home.  A fib on EKG is also new for her and she denies  Previous diagnosis.  My suspicion is that this is new onset CHF.  No antibiotics given despite low grade fever at this time, hospitalist was made aware and will admit to tele.    Everlene Balls, MD 07/22/14 [phone]

## 2014-07-21 NOTE — ED Notes (Signed)
Family requesting social work consult

## 2014-07-21 NOTE — ED Notes (Signed)
Pt to ED from [facility] c/o bilateral leg pain since at least Sunday. Hx of arthritis; pt reports increased pain when ambulating. EMS vitals stable. Pt reports clonidine patch on R arm

## 2014-07-22 ENCOUNTER — Encounter (HOSPITAL_COMMUNITY): Payer: Self-pay | Admitting: Internal Medicine

## 2014-07-22 DIAGNOSIS — I4891 Unspecified atrial fibrillation: Secondary | ICD-10-CM | POA: Diagnosis present

## 2014-07-22 DIAGNOSIS — M179 Osteoarthritis of knee, unspecified: Secondary | ICD-10-CM | POA: Diagnosis present

## 2014-07-22 DIAGNOSIS — I5031 Acute diastolic (congestive) heart failure: Secondary | ICD-10-CM | POA: Diagnosis present

## 2014-07-22 DIAGNOSIS — R1110 Vomiting, unspecified: Secondary | ICD-10-CM | POA: Diagnosis present

## 2014-07-22 DIAGNOSIS — M79605 Pain in left leg: Secondary | ICD-10-CM | POA: Diagnosis present

## 2014-07-22 DIAGNOSIS — Z794 Long term (current) use of insulin: Secondary | ICD-10-CM | POA: Diagnosis not present

## 2014-07-22 DIAGNOSIS — E876 Hypokalemia: Secondary | ICD-10-CM | POA: Diagnosis present

## 2014-07-22 DIAGNOSIS — D649 Anemia, unspecified: Secondary | ICD-10-CM | POA: Diagnosis present

## 2014-07-22 DIAGNOSIS — I495 Sick sinus syndrome: Secondary | ICD-10-CM | POA: Diagnosis present

## 2014-07-22 DIAGNOSIS — I4892 Unspecified atrial flutter: Secondary | ICD-10-CM | POA: Diagnosis present

## 2014-07-22 DIAGNOSIS — I10 Essential (primary) hypertension: Secondary | ICD-10-CM

## 2014-07-22 DIAGNOSIS — N183 Chronic kidney disease, stage 3 unspecified: Secondary | ICD-10-CM | POA: Diagnosis present

## 2014-07-22 DIAGNOSIS — N393 Stress incontinence (female) (male): Secondary | ICD-10-CM | POA: Diagnosis present

## 2014-07-22 DIAGNOSIS — I480 Paroxysmal atrial fibrillation: Secondary | ICD-10-CM | POA: Diagnosis present

## 2014-07-22 DIAGNOSIS — T501X5A Adverse effect of loop [high-ceiling] diuretics, initial encounter: Secondary | ICD-10-CM | POA: Diagnosis present

## 2014-07-22 DIAGNOSIS — M109 Gout, unspecified: Secondary | ICD-10-CM | POA: Diagnosis present

## 2014-07-22 DIAGNOSIS — Z7401 Bed confinement status: Secondary | ICD-10-CM | POA: Diagnosis not present

## 2014-07-22 DIAGNOSIS — M79609 Pain in unspecified limb: Secondary | ICD-10-CM

## 2014-07-22 DIAGNOSIS — I509 Heart failure, unspecified: Secondary | ICD-10-CM | POA: Diagnosis present

## 2014-07-22 DIAGNOSIS — D509 Iron deficiency anemia, unspecified: Secondary | ICD-10-CM | POA: Diagnosis present

## 2014-07-22 DIAGNOSIS — N179 Acute kidney failure, unspecified: Secondary | ICD-10-CM | POA: Diagnosis present

## 2014-07-22 DIAGNOSIS — T502X5A Adverse effect of carbonic-anhydrase inhibitors, benzothiadiazides and other diuretics, initial encounter: Secondary | ICD-10-CM | POA: Diagnosis present

## 2014-07-22 DIAGNOSIS — I5033 Acute on chronic diastolic (congestive) heart failure: Secondary | ICD-10-CM

## 2014-07-22 DIAGNOSIS — M129 Arthropathy, unspecified: Secondary | ICD-10-CM | POA: Diagnosis present

## 2014-07-22 DIAGNOSIS — N390 Urinary tract infection, site not specified: Secondary | ICD-10-CM | POA: Diagnosis present

## 2014-07-22 DIAGNOSIS — I5032 Chronic diastolic (congestive) heart failure: Secondary | ICD-10-CM | POA: Diagnosis present

## 2014-07-22 DIAGNOSIS — I359 Nonrheumatic aortic valve disorder, unspecified: Secondary | ICD-10-CM

## 2014-07-22 DIAGNOSIS — E119 Type 2 diabetes mellitus without complications: Secondary | ICD-10-CM

## 2014-07-22 DIAGNOSIS — M25569 Pain in unspecified knee: Secondary | ICD-10-CM | POA: Diagnosis present

## 2014-07-22 DIAGNOSIS — I129 Hypertensive chronic kidney disease with stage 1 through stage 4 chronic kidney disease, or unspecified chronic kidney disease: Secondary | ICD-10-CM | POA: Diagnosis present

## 2014-07-22 DIAGNOSIS — M79604 Pain in right leg: Secondary | ICD-10-CM | POA: Diagnosis present

## 2014-07-22 DIAGNOSIS — I482 Chronic atrial fibrillation, unspecified: Secondary | ICD-10-CM | POA: Diagnosis present

## 2014-07-22 DIAGNOSIS — Z87440 Personal history of urinary (tract) infections: Secondary | ICD-10-CM | POA: Diagnosis not present

## 2014-07-22 DIAGNOSIS — E1129 Type 2 diabetes mellitus with other diabetic kidney complication: Secondary | ICD-10-CM | POA: Diagnosis present

## 2014-07-22 LAB — URINALYSIS, ROUTINE W REFLEX MICROSCOPIC
Bilirubin Urine: NEGATIVE
GLUCOSE, UA: NEGATIVE mg/dL
Ketones, ur: NEGATIVE mg/dL
Nitrite: NEGATIVE
PROTEIN: 30 mg/dL — AB
SPECIFIC GRAVITY, URINE: 1.013 (ref 1.005–1.030)
UROBILINOGEN UA: 1 mg/dL (ref 0.0–1.0)
pH: 5.5 (ref 5.0–8.0)

## 2014-07-22 LAB — COMPREHENSIVE METABOLIC PANEL WITH GFR
ALT: 9 U/L (ref 0–35)
AST: 16 U/L (ref 0–37)
Albumin: 3.1 g/dL — ABNORMAL LOW (ref 3.5–5.2)
Alkaline Phosphatase: 64 U/L (ref 39–117)
Anion gap: 17 — ABNORMAL HIGH (ref 5–15)
BUN: 37 mg/dL — ABNORMAL HIGH (ref 6–23)
CO2: 22 meq/L (ref 19–32)
Calcium: 9.1 mg/dL (ref 8.4–10.5)
Chloride: 104 meq/L (ref 96–112)
Creatinine, Ser: 1.12 mg/dL — ABNORMAL HIGH (ref 0.50–1.10)
GFR calc Af Amer: 52 mL/min — ABNORMAL LOW
GFR calc non Af Amer: 45 mL/min — ABNORMAL LOW
Glucose, Bld: 118 mg/dL — ABNORMAL HIGH (ref 70–99)
Potassium: 3.6 meq/L — ABNORMAL LOW (ref 3.7–5.3)
Sodium: 143 meq/L (ref 137–147)
Total Bilirubin: 1 mg/dL (ref 0.3–1.2)
Total Protein: 7.2 g/dL (ref 6.0–8.3)

## 2014-07-22 LAB — BASIC METABOLIC PANEL
Anion gap: 14 (ref 5–15)
BUN: 33 mg/dL — ABNORMAL HIGH (ref 6–23)
CHLORIDE: 107 meq/L (ref 96–112)
CO2: 22 mEq/L (ref 19–32)
Calcium: 8.9 mg/dL (ref 8.4–10.5)
Creatinine, Ser: 1.09 mg/dL (ref 0.50–1.10)
GFR calc non Af Amer: 47 mL/min — ABNORMAL LOW (ref >90)
GFR, EST AFRICAN AMERICAN: 54 mL/min — AB (ref >90)
Glucose, Bld: 107 mg/dL — ABNORMAL HIGH (ref 70–99)
POTASSIUM: 3.8 meq/L (ref 3.7–5.3)
Sodium: 143 mEq/L (ref 137–147)

## 2014-07-22 LAB — URINE MICROSCOPIC-ADD ON

## 2014-07-22 LAB — TSH: TSH: 1.14 u[IU]/mL (ref 0.350–4.500)

## 2014-07-22 LAB — CBC
HCT: 28.4 % — ABNORMAL LOW (ref 36.0–46.0)
HEMOGLOBIN: 9.3 g/dL — AB (ref 12.0–15.0)
MCH: 29.3 pg (ref 26.0–34.0)
MCHC: 32.7 g/dL (ref 30.0–36.0)
MCV: 89.6 fL (ref 78.0–100.0)
Platelets: 259 10*3/uL (ref 150–400)
RBC: 3.17 MIL/uL — ABNORMAL LOW (ref 3.87–5.11)
RDW: 12.4 % (ref 11.5–15.5)
WBC: 10 10*3/uL (ref 4.0–10.5)

## 2014-07-22 LAB — GLUCOSE, CAPILLARY
GLUCOSE-CAPILLARY: 170 mg/dL — AB (ref 70–99)
GLUCOSE-CAPILLARY: 119 mg/dL — AB (ref 70–99)
GLUCOSE-CAPILLARY: 148 mg/dL — AB (ref 70–99)
Glucose-Capillary: 109 mg/dL — ABNORMAL HIGH (ref 70–99)

## 2014-07-22 LAB — HEPARIN LEVEL (UNFRACTIONATED): Heparin Unfractionated: 0.25 IU/mL — ABNORMAL LOW (ref 0.30–0.70)

## 2014-07-22 LAB — TROPONIN I: Troponin I: <0.3 ng/mL

## 2014-07-22 LAB — CK: Total CK: 72 U/L (ref 7–177)

## 2014-07-22 LAB — LIPASE, BLOOD: LIPASE: 20 U/L (ref 11–59)

## 2014-07-22 LAB — MAGNESIUM: Magnesium: 1.8 mg/dL (ref 1.5–2.5)

## 2014-07-22 LAB — PRO B NATRIURETIC PEPTIDE: Pro B Natriuretic peptide (BNP): 989.3 pg/mL — ABNORMAL HIGH (ref 0–450)

## 2014-07-22 LAB — T3, FREE: T3 FREE: 2 pg/mL — AB (ref 2.3–4.2)

## 2014-07-22 LAB — LACTIC ACID, PLASMA: LACTIC ACID, VENOUS: 1 mmol/L (ref 0.5–2.2)

## 2014-07-22 LAB — T4, FREE: FREE T4: 1.37 ng/dL (ref 0.80–1.80)

## 2014-07-22 MED ORDER — ONDANSETRON HCL 4 MG PO TABS: 4.0000 mg | ORAL_TABLET | Freq: Four times a day (QID) | ORAL | Status: DC | PRN

## 2014-07-22 MED ORDER — CEFTRIAXONE SODIUM 1 G IJ SOLR
1.0000 g | INTRAMUSCULAR | Status: DC
  Administered 2014-07-22 – 2014-07-28 (×7): 1 g via INTRAVENOUS
  Filled 2014-07-22 (×9): qty 10

## 2014-07-22 MED ORDER — AMLODIPINE BESYLATE 5 MG PO TABS
5.0000 mg | ORAL_TABLET | Freq: Every day | ORAL | Status: DC
  Administered 2014-07-22 – 2014-07-31 (×10): 5 mg via ORAL
  Filled 2014-07-22 (×11): qty 1

## 2014-07-22 MED ORDER — HEPARIN (PORCINE) IN NACL 100-0.45 UNIT/ML-% IJ SOLN
1150.0000 [IU]/h | INTRAMUSCULAR | Status: AC
  Administered 2014-07-22: 1050 [IU]/h via INTRAVENOUS
  Administered 2014-07-23: 1150 [IU]/h via INTRAVENOUS
  Administered 2014-07-23: 1050 [IU]/h via INTRAVENOUS
  Filled 2014-07-22 (×2): qty 250

## 2014-07-22 MED ORDER — ACETAMINOPHEN 650 MG RE SUPP: 650.0000 mg | Freq: Four times a day (QID) | RECTAL | Status: DC | PRN

## 2014-07-22 MED ORDER — ACETAMINOPHEN 500 MG PO TABS: 1000.0000 mg | ORAL_TABLET | Freq: Three times a day (TID) | ORAL | Status: DC

## 2014-07-22 MED ORDER — ACETAMINOPHEN 325 MG PO TABS
650.0000 mg | ORAL_TABLET | Freq: Four times a day (QID) | ORAL | Status: DC | PRN
  Administered 2014-07-22 – 2014-07-23 (×4): 650 mg via ORAL
  Filled 2014-07-22 (×4): qty 2

## 2014-07-22 MED ORDER — INSULIN ASPART 100 UNIT/ML ~~LOC~~ SOLN
0.0000 [IU] | Freq: Three times a day (TID) | SUBCUTANEOUS | Status: DC
  Administered 2014-07-22: 2 [IU] via SUBCUTANEOUS
  Administered 2014-07-23: 1 [IU] via SUBCUTANEOUS
  Administered 2014-07-23 – 2014-07-24 (×3): 2 [IU] via SUBCUTANEOUS
  Administered 2014-07-24: 1 [IU] via SUBCUTANEOUS
  Administered 2014-07-24: 2 [IU] via SUBCUTANEOUS
  Administered 2014-07-25 (×2): 3 [IU] via SUBCUTANEOUS
  Administered 2014-07-25: 5 [IU] via SUBCUTANEOUS
  Administered 2014-07-26: 1 [IU] via SUBCUTANEOUS

## 2014-07-22 MED ORDER — CLONIDINE HCL 0.1 MG/24HR TD PTWK
0.1000 mg | MEDICATED_PATCH | TRANSDERMAL | Status: DC
Start: 2014-07-24 — End: 2014-07-22

## 2014-07-22 MED ORDER — FUROSEMIDE 10 MG/ML IJ SOLN
20.0000 mg | Freq: Once | INTRAMUSCULAR | Status: DC
  Filled 2014-07-22: qty 2

## 2014-07-22 MED ORDER — FUROSEMIDE 10 MG/ML IJ SOLN
INTRAMUSCULAR | Status: AC
  Filled 2014-07-22: qty 4

## 2014-07-22 MED ORDER — MORPHINE SULFATE 2 MG/ML IJ SOLN
1.0000 mg | INTRAMUSCULAR | Status: DC | PRN
  Administered 2014-07-22 – 2014-07-23 (×2): 1 mg via INTRAVENOUS
  Filled 2014-07-22 (×3): qty 1

## 2014-07-22 MED ORDER — HEPARIN (PORCINE) IN NACL 100-0.45 UNIT/ML-% IJ SOLN
900.0000 [IU]/h | INTRAMUSCULAR | Status: DC
  Administered 2014-07-22: 900 [IU]/h via INTRAVENOUS
  Filled 2014-07-22: qty 250

## 2014-07-22 MED ORDER — FUROSEMIDE 10 MG/ML IJ SOLN
20.0000 mg | Freq: Once | INTRAMUSCULAR | Status: AC
  Administered 2014-07-22: 20 mg via INTRAVENOUS

## 2014-07-22 MED ORDER — ONDANSETRON HCL 4 MG/2ML IJ SOLN: 4.0000 mg | Freq: Four times a day (QID) | INTRAMUSCULAR | Status: DC | PRN

## 2014-07-22 MED ORDER — VANCOMYCIN HCL IN DEXTROSE 750-5 MG/150ML-% IV SOLN
750.0000 mg | INTRAVENOUS | Status: DC
  Administered 2014-07-22 – 2014-07-23 (×2): 750 mg via INTRAVENOUS
  Filled 2014-07-22 (×2): qty 150

## 2014-07-22 MED ORDER — SODIUM CHLORIDE 0.9 % IJ SOLN
3.0000 mL | Freq: Two times a day (BID) | INTRAMUSCULAR | Status: DC
  Administered 2014-07-23 – 2014-07-28 (×9): 3 mL via INTRAVENOUS

## 2014-07-22 MED ORDER — HEPARIN BOLUS VIA INFUSION
3000.0000 [IU] | Freq: Once | INTRAVENOUS | Status: AC
Start: 2014-07-22 — End: 2014-07-22
  Administered 2014-07-22: 3000 [IU] via INTRAVENOUS
  Filled 2014-07-22: qty 3000

## 2014-07-22 MED ORDER — CLONIDINE HCL 0.3 MG/24HR TD PTWK
0.3000 mg | MEDICATED_PATCH | TRANSDERMAL | Status: DC
  Administered 2014-07-24 – 2014-07-31 (×2): 0.3 mg via TRANSDERMAL
  Filled 2014-07-22 (×2): qty 1

## 2014-07-22 MED ORDER — FERROUS FUMARATE 325 (106 FE) MG PO TABS
1.0000 | ORAL_TABLET | Freq: Two times a day (BID) | ORAL | Status: DC
  Administered 2014-07-22 – 2014-07-31 (×19): 106 mg via ORAL
  Filled 2014-07-22 (×21): qty 1

## 2014-07-22 MED ORDER — ALISKIREN FUMARATE 150 MG PO TABS
150.0000 mg | ORAL_TABLET | Freq: Every day | ORAL | Status: DC
  Administered 2014-07-22 – 2014-07-31 (×10): 150 mg via ORAL
  Filled 2014-07-22 (×10): qty 1

## 2014-07-22 MED ORDER — SODIUM CHLORIDE 0.9 % IJ SOLN
3.0000 mL | Freq: Two times a day (BID) | INTRAMUSCULAR | Status: DC
  Administered 2014-07-22 – 2014-07-28 (×7): 3 mL via INTRAVENOUS

## 2014-07-22 MED ORDER — HEPARIN BOLUS VIA INFUSION
1000.0000 [IU] | INTRAVENOUS | Status: AC
  Administered 2014-07-22: 1000 [IU] via INTRAVENOUS
  Filled 2014-07-22: qty 1000

## 2014-07-22 NOTE — Consult Note (Signed)
CARDIOLOGY CONSULT NOTE   Patient ID: Courtney Bradford MRN: [id number], DOB/AGE: November 16, 1930   Admit date: 07/21/2014 Date of Consult: 07/22/2014   Primary Physician: Maggie Font, MD Primary Cardiologist: None  Pt. Profile  48F DM, HTN, arthritis who p/w knee pain. Work up demonstrated mild CHF and new AF.   Problem List  Past Medical History  Diagnosis Date  . Hypertension   . Diabetes mellitus   . Anemia   . Cataract   . Headache(784.0)   . Arthritis     right knee    Past Surgical History  Procedure Laterality Date  . Abdominal hysterectomy    . Breast surgery      breast biopsy  . Anterior and posterior repair  05/17/2011    Procedure: ANTERIOR (CYSTOCELE) AND POSTERIOR REPAIR (RECTOCELE);  Surgeon: Eli Hose, MD;  Location: [facility];  Service: Gynecology;  Laterality: N/A;  Anterior repair with TVT bladder sling and cystoscopy  . Bladder suspension  05/17/2011    Procedure: TRANSVAGINAL TAPE (TVT) PROCEDURE;  Surgeon: Eli Hose, MD;  Location: [facility];  Service: Gynecology;  Laterality: N/A;     Allergies  Allergies  Allergen Reactions  . Lactose Intolerance (Gi)     Upset stomach    HPI 48F DM, HTN, arthritis who p/w knee pain. Work up demonstrated mild CHF and new AF.   MS Courtney Bradford reported she has been in [place] until about 4 days ago when she developed knee pain. She noticed a mild increase in LE swelling without dyspnea. No weight gain or cough. Denies palpitations, syncope, presyncope. She did note mild low grade temp.  In ED, work-up notable for Cr 1.12, K 3.6, BNP 989, TnI <0.03, UA ++. CXR with mild cardiomegaly but not acute abnormality. ECG demonstrated rate controlled AF. She was admitted to medicine. She was started on vancomycin for possible cellulitis. Cardiology was consulted.   Inpatient Medications  . aliskiren  150 mg Oral Daily  . amLODipine  5 mg Oral Daily  . [START ON 07/24/2014] cloNIDine  0.1 mg Transdermal Weekly  . ferrous  fumarate  1 tablet Oral BID  . furosemide  20 mg Intravenous Once  . heparin  3,000 Units Intravenous Once  . insulin aspart  0-9 Units Subcutaneous TID WC  . sodium chloride  3 mL Intravenous Q12H  . sodium chloride  3 mL Intravenous Q12H  . vancomycin  750 mg Intravenous Q24H    Family History History reviewed. No pertinent family history.   Social History History   Social History  . Marital Status: Single    Spouse Name: N/A    Number of Children: N/A  . Years of Education: N/A   Occupational History  . Not on file.   Social History Main Topics  . Smoking status: Never Smoker   . Smokeless tobacco: Not on file  . Alcohol Use: No  . Drug Use: No  . Sexual Activity: No   Other Topics Concern  . Not on file   Social History Narrative  . No narrative on file     Review of Systems  General:  No chill, night sweats or weight changes.  Cardiovascular:  No chest pain, dyspnea on exertion, edema, orthopnea, palpitations, paroxysmal nocturnal dyspnea. Dermatological: No rash, lesions/masses Respiratory: No cough, dyspnea Urologic: No hematuria, dysuria Abdominal:   No nausea, vomiting, diarrhea, bright red blood per rectum, melena, or hematemesis Neurologic:  No visual changes, wkns, changes in mental status. All other  systems reviewed and are otherwise negative except as noted above.  Physical Exam  Blood pressure 137/47, pulse 75, temperature 98.6 F (37 C), temperature source Oral, resp. rate 22, height 5\' 3"  (1.6 m), weight 70.9 kg (156 lb 4.9 oz), SpO2 99.00%.  General: Pleasant, NAD Psych: Normal affect. Neuro: Alert and oriented X 3. Moves all extremities spontaneously. HEENT: Normal  Neck: Supple without bruits or JVD. Lungs:  Resp regular and unlabored, CTA. Heart: RRR no s3, s4. 2/6 crescendo decrescendo murmur at LUSB Abdomen: Soft, non-tender, non-distended, BS + x 4.  Extremities: No clubbing, cyanosis. Trace LE edema. Warm.  DP/PT/Radials 2+ and  equal bilaterally.  Labs   Recent Labs  07/21/14 2321  CKTOTAL 72  TROPONINI <0.30   Lab Results  Component Value Date   WBC 11.3* 07/21/2014   HGB 10.1* 07/21/2014   HCT 30.7* 07/21/2014   MCV 87.0 07/21/2014   PLT 270 07/21/2014    Recent Labs Lab 07/21/14 2321  NA 143  K 3.6*  CL 104  CO2 22  BUN 37*  CREATININE 1.12*  CALCIUM 9.1  PROT 7.2  BILITOT 1.0  ALKPHOS 64  ALT 9  AST 16  GLUCOSE 118*   No results found for this basename: CHOL, HDL, LDLCALC, TRIG   No results found for this basename: DDIMER    Radiology/Studies  Dg Chest 2 View  07/22/2014   CLINICAL DATA:  LEG PAIN  EXAM: CHEST  2 VIEW  COMPARISON:  None.  FINDINGS: Mild cardiomegaly is present. Mediastinal silhouettes within normal limits.  The lungs are normally inflated. No airspace consolidation, pleural effusion, or pulmonary edema is identified. There is no pneumothorax.  No acute osseous abnormality identified.  IMPRESSION: 1. No acute cardiopulmonary abnormality. 2. Mild cardiomegaly.   Electronically Signed   By: Jeannine Boga M.D.   On: 07/22/2014 00:38    ECG 07/21/14: Coarse AF with NSSTTWC  Tele Alternating NSR with AF. She does have some slightly longer RR intervals in sinus suggestive of sinus node dysfunction.   ASSESSMENT AND PLAN  43F DM, HTN, arthritis who p/w knee pain. Work up demonstrated mild CHF and new AF. Based on mild nature of CHF, I suspect it is due to AF rather than the cause of AF. CHADSVASC = 6. Suggestion of at least mild sinus node dysfunctions suggests we must be gentle with AV nodal agents. AF is currently rate controlled. She has no falls at home and risk/benefit of anticoagulation seems to favor anticoagulation. She looks approximately euvolemic.   Please 1. Obtain echocardiogram 2. TSH 3. Agree with UFH gtt given CHADSVASC Score 4. Please look into what NOACs her insurance would cover 5. Hold off on further diuresis now  Signed, Lamar Sprinkles,  MD 07/22/2014, 4:54 AM

## 2014-07-22 NOTE — H&P (Signed)
[facility] History and Physical  CHRISTINEA CLINEBELL [id number] DOB: [birth year] DOA: 07/21/2014  Referring physician: ER physician. PCP: Maggie Font, MD   Chief Complaint: Left leg pain and swelling.  HPI: Courtney Bradford is a 80 y.o. female with history of diabetes mellitus hypertension chronic anemia and arthritis has been experiencing increasing pain in her left leg since last Saturday 4 days ago. Patient denies any trauma fall or insect bite had any recent long distance travel. Patient has been having some subjective feeling of fever and chills. In the ER patient was found to have pain on palpation of the leg with mild erythema of the left lower extremity. Patient also has swelling extending up to the knee patient states she also has pain behind her knee. Patient is mildly febrile. Chest x-ray shows mild cardiomegaly and labs reveal percent doses and elevated BNP. Patient was given one dose of Lasix for possible CHF and EKG shows atrial flutter which is new onset. Patient denies any chest pain or any palpitations. Patient is admitted for further management. Patient denies any nausea vomiting abdominal pain diarrhea headache focal deficits or any previous history of GI bleed.   Review of Systems: As presented in the history of presenting illness, rest negative.  Past Medical History  Diagnosis Date  . Hypertension   . Diabetes mellitus   . Anemia   . Cataract   . Headache(784.0)   . Arthritis     right knee   Past Surgical History  Procedure Laterality Date  . Abdominal hysterectomy    . Breast surgery      breast biopsy  . Anterior and posterior repair  05/17/2011    Procedure: ANTERIOR (CYSTOCELE) AND POSTERIOR REPAIR (RECTOCELE);  Surgeon: Eli Hose, MD;  Location: [facility];  Service: Gynecology;  Laterality: N/A;  Anterior repair with TVT bladder sling and cystoscopy  . Bladder suspension  05/17/2011    Procedure: TRANSVAGINAL TAPE (TVT) PROCEDURE;  Surgeon: Eli Hose, MD;  Location: [facility];  Service: Gynecology;  Laterality: N/A;   Social History:  reports that she has never smoked. She does not have any smokeless tobacco history on file. She reports that she does not drink alcohol or use illicit drugs. Where does patient live home. Can patient participate in ADLs? Yes.  Allergies  Allergen Reactions  . Lactose Intolerance (Gi)     Upset stomach    Family History: History reviewed. No pertinent family history.    Prior to Admission medications   Medication Sig Start Date End Date Taking? Authorizing Provider  aliskiren (TEKTURNA) 150 MG tablet Take 150 mg by mouth daily.    Yes Historical Provider, MD  amLODipine (NORVASC) 5 MG tablet Take 5 mg by mouth daily.     Yes Historical Provider, MD  CLONIDINE HCL TD Place 1 patch onto the skin once a week.   Yes Historical Provider, MD  ferrous fumarate (HEMOCYTE - 106 MG FE) 325 (106 FE) MG TABS tablet Take 1 tablet by mouth 2 (two) times daily.   Yes Historical Provider, MD  insulin regular (HUMULIN R) 100 units/mL injection Inject 10 Units into the skin every morning.     Yes Historical Provider, MD  Olopatadine HCl 0.2 % SOLN Place 1 drop into both eyes daily as needed (watery eyes).    Yes Historical Provider, MD    Physical Exam: Filed Vitals:   07/21/14 2325 07/22/14 0030 07/22/14 0130 07/22/14 0230  BP: 154/56 151/60 126/60 132/55  Pulse: 89 71 69 78  Temp: 99.7 F (37.6 C)   100.4 F (38 C)  TempSrc: Oral   Oral  Resp: 19 22 20 21   Weight:      SpO2: 98% 97% 94% 96%     General:  Well-developed and nourished.  Eyes: Anicteric no pallor.  ENT: No discharge from the ears eyes nose mouth.  Neck: No mass felt. JVD not appreciated.  Cardiovascular: S1-S2 heard.  Respiratory: No rhonchi or crepitations.  Abdomen:  Soft nontender bowel sounds present.  Skin: Mild erythema in the left lower extremity.  Musculoskeletal: Edema of both lower is today more the left side with  mild tenderness. Patient has significant pain on moving the leg on the left side. No acute ischemic changes.  Psychiatric: Appears normal.  Neurologic: Alert awake oriented to time place and person. Moves all extremities.  Labs on Admission:  Basic Metabolic Panel:  Recent Labs Lab 07/21/14 2321  NA 143  K 3.6*  CL 104  CO2 22  GLUCOSE 118*  BUN 37*  CREATININE 1.12*  CALCIUM 9.1  MG 1.8   Liver Function Tests:  Recent Labs Lab 07/21/14 2321  AST 16  ALT 9  ALKPHOS 64  BILITOT 1.0  PROT 7.2  ALBUMIN 3.1*    Recent Labs Lab 07/21/14 2321  LIPASE 20   No results found for this basename: AMMONIA,  in the last 168 hours CBC:  Recent Labs Lab 07/21/14 2321  WBC 11.3*  NEUTROABS 9.1*  HGB 10.1*  HCT 30.7*  MCV 87.0  PLT 270   Cardiac Enzymes:  Recent Labs Lab 07/21/14 2321  CKTOTAL 72  TROPONINI <0.30    BNP (last 3 results)  Recent Labs  07/21/14 2321  PROBNP 989.3*   CBG: No results found for this basename: GLUCAP,  in the last 168 hours  Radiological Exams on Admission: Dg Chest 2 View  07/22/2014   CLINICAL DATA:  LEG PAIN  EXAM: CHEST  2 VIEW  COMPARISON:  None.  FINDINGS: Mild cardiomegaly is present. Mediastinal silhouettes within normal limits.  The lungs are normally inflated. No airspace consolidation, pleural effusion, or pulmonary edema is identified. There is no pneumothorax.  No acute osseous abnormality identified.  IMPRESSION: 1. No acute cardiopulmonary abnormality. 2. Mild cardiomegaly.   Electronically Signed   By: Jeannine Boga M.D.   On: 07/22/2014 00:38    EKG: Independently reviewed. Atrial flutter rate controlled.  Assessment/Plan Principal Problem:   Leg pain, left Active Problems:   HYPERTENSION, BENIGN   CHF (congestive heart failure)   Atrial flutter, unspecified   Diabetes mellitus type 2, controlled   Left leg pain   1. Left lower extremity pain and swelling - differentials include possible DVT  versus cellulitis. Patient has been placed on empiric antibiotics after blood cultures obtained and we will get Dopplers of the lower extremities in a.m. Patient has been ordered started on IV heparin for new onset atrial flutter. 2. Atrial flutter rate controlled - patient has been placed on heparin since patient has diabetes and hypertension and age being the risk factors for stroke. Check 2-D echo. Check thyroid function panel. 3. Possible CHF - check 2-D echo. Patient was given one dose of Lasix. Am holding off any further doses and further doses to be decided based on patient's clinical symptoms. 4. Diabetes mellitus type 2 - for now I have placed patient on sliding-scale coverage and home medication has to be verified. 5. Hypertension - continue home  medications. 6. Chronic anemia - follow CBC and patient is on iron supplements. 7. Possible chronic kidney disease stage II - follow metabolic panel and intake output. UA is pending.    Code Status: Full code.  Family Communication: Patient's family at the bedside.  Disposition Plan: Admit to inpatient.    Annajulia Lewing N. [facility] Pager [phone].  If 7PM-7AM, please contact night-coverage [email or web address] Password [facility] 07/22/2014, 2:55 AM

## 2014-07-22 NOTE — ED Notes (Signed)
Asked pt if she was able to urinate or if she could try for [place]. Pt did not want to try to use the bedpan at this time. Told pt that the Dr is waiting on the urinalysis results so let us know as soon as she can go.

## 2014-07-22 NOTE — ED Notes (Signed)
Lasix to be given on 3E per Dorothea Ogle, RN due to incontinence of urine

## 2014-07-22 NOTE — Progress Notes (Signed)
ANTICOAGULATION & ANTIBIOTIC CONSULT NOTE - Initial Consult  Pharmacy Consult for Heparin and Vancomycin  Indication: atrial fibrillation and cellulitis   Allergies  Allergen Reactions  . Lactose Intolerance (Gi)     Upset stomach    Patient Measurements: Height: 5\' 3"  (160 cm) Weight: 156 lb 4.9 oz (70.9 kg) IBW/kg (Calculated) : 52.4 Heparin Dosing Weight: 67kg   Vital Signs: Temp: 100.4 F (38 C) (09/23 0230) Temp src: Oral (09/23 0230) BP: 127/72 mmHg (09/23 0300) Pulse Rate: 81 (09/23 0300)  Labs:  Recent Labs  07/21/14 2321  HGB 10.1*  HCT 30.7*  PLT 270  CREATININE 1.12*  CKTOTAL 72  TROPONINI <0.30    Estimated Creatinine Clearance: 37.8 ml/min (by C-G formula based on Cr of 1.12).   Medical History: Past Medical History  Diagnosis Date  . Hypertension   . Diabetes mellitus   . Anemia   . Cataract   . Headache(784.0)   . Arthritis     right knee    Medications:  Prescriptions prior to admission  Medication Sig Dispense Refill  . aliskiren (TEKTURNA) 150 MG tablet Take 150 mg by mouth daily.       Marland Kitchen amLODipine (NORVASC) 5 MG tablet Take 5 mg by mouth daily.        Marland Kitchen CLONIDINE HCL TD Place 1 patch onto the skin once a week.      . ferrous fumarate (HEMOCYTE - 106 MG FE) 325 (106 FE) MG TABS tablet Take 1 tablet by mouth 2 (two) times daily.      . insulin regular (HUMULIN R) 100 units/mL injection Inject 10 Units into the skin every morning.        . Olopatadine HCl 0.2 % SOLN Place 1 drop into both eyes daily as needed (watery eyes).         Assessment: 29 YOF presented to the ED with bilateral lower extremity pain. Pharmacy consulted to start Vancomycin for cellulitis and heparin for Afib. She was not on anticoagulation prior to admission. Hgb is low at 10.1 and Plt wnl.   Goal of Therapy:  Heparin level 0.3-0.7 units/ml Monitor platelets by anticoagulation protocol: Yes Vancomycin trough 10-15 mcg/mL   Plan:  Give heparin bolus of 3000  units followed by heparin infusion at 900 units/hr Start patient on Vancomycin 750 mg IV Q 24 hours  Monitor CBC, daily HL, s/s of bleeding, renal fx, cultures and patient's clinical progress  F/u 8 hr HL  F/u VT at [facility], PharmD.  Clinical Pharmacist Pager [phone]

## 2014-07-22 NOTE — Progress Notes (Signed)
Utilization Review Completed Jaleil Renwick J. Cachet Mccutchen, RN, BSN, NCM [phone]  

## 2014-07-22 NOTE — Progress Notes (Signed)
Temp 103. Dr Wynelle Cleveland aware

## 2014-07-22 NOTE — Progress Notes (Signed)
ANTICOAGULATION CONSULT NOTE -Follow up Pharmacy Consult for Heparin Indication: atrial fibrillation  Allergies  Allergen Reactions  . Lactose Intolerance (Gi)     Upset stomach    Patient Measurements: Height: 5\' 3"  (160 cm) Weight: 156 lb 4.9 oz (70.9 kg) IBW/kg (Calculated) : 52.4 Heparin Dosing Weight: 67kg   Vital Signs: Temp: 100.8 F (38.2 C) (09/23 1431) Temp src: Oral (09/23 1431) BP: 147/45 mmHg (09/23 1431) Pulse Rate: 81 (09/23 1431)  Labs:  Recent Labs  07/21/14 2321 07/22/14 0544 07/22/14 1335  HGB 10.1* 9.3*  --   HCT 30.7* 28.4*  --   PLT 270 259  --   HEPARINUNFRC  --   --  0.25*  CREATININE 1.12* 1.09  --   CKTOTAL 72  --   --   TROPONINI <0.30  --   --     Estimated Creatinine Clearance: 38.9 ml/min (by C-G formula based on Cr of 1.09).   Medical History: Past Medical History  Diagnosis Date  . Hypertension   . Diabetes mellitus   . Anemia   . Cataract   . Headache(784.0)   . Arthritis     right knee    Medications:  Prescriptions prior to admission  Medication Sig Dispense Refill  . aliskiren (TEKTURNA) 150 MG tablet Take 150 mg by mouth daily.       Marland Kitchen amLODipine (NORVASC) 5 MG tablet Take 5 mg by mouth daily.        . cloNIDine (CATAPRES - DOSED IN MG/24 HR) 0.3 mg/24hr patch Place 0.3 mg onto the skin once a week.      . ferrous fumarate (HEMOCYTE - 106 MG FE) 325 (106 FE) MG TABS tablet Take 1 tablet by mouth 2 (two) times daily.      . insulin regular (HUMULIN R) 100 units/mL injection Inject 10 Units into the skin every morning.        . Olopatadine HCl 0.2 % SOLN Place 1 drop into both eyes daily as needed (watery eyes).         Assessment: 6 hr heparin level = 0.25 on 900 units/hr IV heparin drip in this 82 YOF on heparin for Afib. She was not on anticoagulation prior to admission. Hgb low at 10.1 on admission and decreased to 9.3 this AM.  Pltc wnl. No bleeding noted.  Cardilogist consulted and notes that PAF-rate  controlled and asymptomatic. In and out of sinus rhythm. Suspects triggered by infection/fever. No rate control needed and recommends to consider transition to NOAC per Dr. Martinique.   SCr = 1.09. estimated CrCl is 39 ml/min    Goal of Therapy:  Heparin level 0.3-0.7 units/ml Monitor platelets by anticoagulation protocol: Yes    Plan:  Give heparin bolus of 1000 units x1 Increase heparin infusion rate to 1050 units/hr F/u 8 hr HL  CBC, daily HL, s/s of bleeding    Nicole Cella, RPh Clinical Pharmacist Pager: [phone] 07/22/2014

## 2014-07-22 NOTE — Progress Notes (Signed)
  Echocardiogram 2D Echocardiogram has been performed.  Darlina Sicilian M 07/22/2014, 11:24 AM

## 2014-07-22 NOTE — Care Management Note (Addendum)
    Page 1 of 2   07/27/2014     2:03:21 PM CARE MANAGEMENT NOTE 07/27/2014  Patient:  Courtney Bradford, Courtney Bradford   Account Number:  [id number]  Date Initiated:  07/22/2014  Documentation initiated by:  [facility]  Subjective/Objective Assessment:   80 y.o. female with Hx of DM, HTN,  chronic anemia and arthritis has been experiencing increasing pain in her left leg since last Saturday 4 days ago; differentials include possible DVT versus cellulitis; A Flutter//Home alone.     Action/Plan:   Empiric antibiotics;  blood cultures; Dopplers of the lower extremities;  IV heparin for new onset atrial flutter.// Access for disposition needs; benefits check for Brilinta.   Anticipated DC Date:  07/24/2014   Anticipated DC Plan:  [place]  CM consult  Medication Assistance      PAC Choice  [place]   Choice offered to / List presented to:  C-4 Adult Children   DME arranged  [place]      DME agency  [place] arranged  HH-1 RN  HH-10 DISEASE MANAGEMENT  HH-2 PT      [place].   Status of service:  Completed, signed off Medicare Important Message given?  YES (If response is "NO", the following Medicare IM given date fields will be blank) Date Medicare IM given:  07/24/2014 Medicare IM given by:  [facility] Date Additional Medicare IM given:  07/27/2014 Additional Medicare IM given by:  Dillion Stowers  Discharge Disposition:    Per UR Regulation:  Reviewed for med. necessity/level of care/duration of stay  If discussed at Page of Stay Meetings, dates discussed:   07/28/2014    Comments:  07/27/14 [place], RN, BSN, [employer] [phone] Spoke with pt and son at bedside regarding discharge planning for [facility]. Offered pt list of home health agencies to choose from.  Son chose [facility] to render services. Clover Mealy of [facility] notified. DME needs identified at this time include a rolling walker. DME order through [facility] and will be delivered to room prior to pt d/c home.  07/24/14 [place], RN, BSN, [employer] [phone] Adjusting medications; started on Eliquis yesterday; unfortunately heparin was not DC'd.  Heparin now stopped. Due to age and renal dysfunction will need to be on lower dose of Eliquis.  07/23/14 [place], RN, BSN, NCM [phone] S/W ALEXIA @ [place] [facility] # [phone] ELIQUIS 5 MG BID PRIOR APPROVAL - YES  # [phone] 2ND INS: MEDICAID: PATIENT  WILL PAY  $ 3.00 PER RX  07/22/14 [place], RN, BSN, [employer] [phone] Spoke with pt and son at bedside regarding benefits check for Eliquis.  Pt has brochure with 30 day free card and refill assistance card intact.  Pt utilizes [facility] in [place] for prescription needs. Pt verbalizes importance of filling medication upon discharge.

## 2014-07-22 NOTE — Progress Notes (Addendum)
[facility] Progress Note   Courtney Bradford [id number] DOB: May 11, 1931 DOA: 07/21/2014 PCP: Maggie Font, MD  Brief narrative: Courtney Bradford is a 80 y.o. female with DM, HTN, anemia admitted with b/l leg pain starting about 4 days prior to admission. She states it started in her right leg and then went to her left leg... In the ER she was found to have a-flutter and possible CHF and given Lasix. No h/o A-flutter in the past.    Subjective: Continues to have pain in both legs  Assessment/Plan: Principal Problem:   Leg pain- bilateral- fevers - etiology undetermined- arthritis?  - admitting doctor suspected cellulitis and started Vancomycin- no signs of cellulitis on my exam -clearly has some edema in right leg- venous duplex ordered by admitting physician   Active Problems:    Atrial flutter, unspecified - rate controlled - TSH FT4 normal- free T3 slightly low- follow - check Xarelto, Pradaxa and Apixiban cost- check hemoccult - f/u on ECHO and calculate CHA2DsVasc    Diabetes mellitus type 2, controlled - cont sliding scale  UTI- fevers - f/u culture- patient asymptomatic     HYPERTENSION, BENIGN - Clonidine, aliskiren and Amlodipine    CHF ? - f/u ECHO  CKD 3 - follow  Iron deficiency anemia? - on Iron supplements at home  Code Status: Full code Family Communication: daughter in room Disposition Plan: home DVT prophylaxis: IV heparin  Consultants: cardiology  Procedures: None  Antibiotics: Anti-infectives   Start     Dose/Rate Route Frequency Ordered Stop   07/22/14 0500  vancomycin (VANCOCIN) IVPB 750 mg/150 ml premix     750 mg 150 mL/hr over 60 Minutes Intravenous Every 24 hours 07/22/14 0428           Objective: Filed Weights   07/21/14 2214 07/22/14 0350  Weight: 68.04 kg (150 lb) 70.9 kg (156 lb 4.9 oz)    Intake/Output Summary (Last 24 hours) at 07/22/14 1222 Last data filed at 07/22/14 0900  Gross per 24 hour  Intake    1000 ml  Output      0 ml  Net   1000 ml     Vitals Filed Vitals:   07/22/14 0230 07/22/14 0300 07/22/14 0350 07/22/14 0414  BP: 132/55 127/72  137/47  Pulse: 78 81  75  Temp: 100.4 F (38 C)   98.6 F (37 C)  TempSrc: Oral   Oral  Resp: 21 31  22   Height:   5\' 3"  (1.6 m)   Weight:   70.9 kg (156 lb 4.9 oz)   SpO2: 96% 98%  99%    Exam: General: No acute respiratory distress Lungs: Clear to auscultation bilaterally without wheezes or crackles Cardiovascular: Regular rate and rhythm without murmur gallop or rub normal S1 and S2 Abdomen: Nontender, nondistended, soft, bowel sounds positive, no rebound, no ascites, no appreciable mass Extremities: No significant cyanosis, clubbing, mild edema right leg  Data Reviewed: Basic Metabolic Panel:  Recent Labs Lab 07/21/14 2321 07/22/14 0544  NA 143 143  K 3.6* 3.8  CL 104 107  CO2 22 22  GLUCOSE 118* 107*  BUN 37* 33*  CREATININE 1.12* 1.09  CALCIUM 9.1 8.9  MG 1.8  --    Liver Function Tests:  Recent Labs Lab 07/21/14 2321  AST 16  ALT 9  ALKPHOS 64  BILITOT 1.0  PROT 7.2  ALBUMIN 3.1*    Recent Labs Lab 07/21/14 2321  LIPASE 20   No results found  for this basename: AMMONIA,  in the last 168 hours CBC:  Recent Labs Lab 07/21/14 2321 07/22/14 0544  WBC 11.3* 10.0  NEUTROABS 9.1*  --   HGB 10.1* 9.3*  HCT 30.7* 28.4*  MCV 87.0 89.6  PLT 270 259   Cardiac Enzymes:  Recent Labs Lab 07/21/14 2321  CKTOTAL 72  TROPONINI <0.30   BNP (last 3 results)  Recent Labs  07/21/14 2321  PROBNP 989.3*   CBG:  Recent Labs Lab 07/22/14 0455 07/22/14 1126  GLUCAP 109* 170*    No results found for this or any previous visit (from the past 240 hour(s)).   Studies:  Recent x-ray studies have been reviewed in detail by the Attending Physician  Scheduled Meds:  Scheduled Meds: . aliskiren  150 mg Oral Daily  . amLODipine  5 mg Oral Daily  . [START ON 07/24/2014] cloNIDine  0.1 mg  Transdermal Weekly  . ferrous fumarate  1 tablet Oral BID  . insulin aspart  0-9 Units Subcutaneous TID WC  . sodium chloride  3 mL Intravenous Q12H  . sodium chloride  3 mL Intravenous Q12H  . vancomycin  750 mg Intravenous Q24H   Continuous Infusions: . heparin 900 Units/hr (07/22/14 [id number])    Time spent on care of this patient: 30 min   [place], MD 07/22/2014, 12:22 PM  LOS: 1 day   [facility] Office  [phone] Pager - Text Page per [email or web address]  If 7PM-7AM, please contact night-coverage [email or web address]

## 2014-07-22 NOTE — Progress Notes (Signed)
TELEMETRY: Reviewed telemetry pt in Atrial flutter with controlled VR. In and out of sinus rhythm: Filed Vitals:   07/22/14 0230 07/22/14 0300 07/22/14 0350 07/22/14 0414  BP: 132/55 127/72  137/47  Pulse: 78 81  75  Temp: 100.4 F (38 C)   98.6 F (37 C)  TempSrc: Oral   Oral  Resp: 21 31  22   Height:   5\' 3"  (1.6 m)   Weight:   156 lb 4.9 oz (70.9 kg)   SpO2: 96% 98%  99%    Intake/Output Summary (Last 24 hours) at 07/22/14 1340 Last data filed at 07/22/14 0900  Gross per 24 hour  Intake   1000 ml  Output      0 ml  Net   1000 ml   Filed Weights   07/21/14 2214 07/22/14 0350  Weight: 150 lb (68.04 kg) 156 lb 4.9 oz (70.9 kg)    Subjective Feels well. No cough, chest pain, SOB, dysuria.  Marland Kitchen acetaminophen  1,000 mg Oral TID WC & HS  . aliskiren  150 mg Oral Daily  . amLODipine  5 mg Oral Daily  . [START ON 07/24/2014] cloNIDine  0.1 mg Transdermal Weekly  . ferrous fumarate  1 tablet Oral BID  . insulin aspart  0-9 Units Subcutaneous TID WC  . sodium chloride  3 mL Intravenous Q12H  . sodium chloride  3 mL Intravenous Q12H  . vancomycin  750 mg Intravenous Q24H   . heparin 900 Units/hr (07/22/14 0712)    LABS: Basic Metabolic Panel:  Recent Labs  07/21/14 2321 07/22/14 0544  NA 143 143  K 3.6* 3.8  CL 104 107  CO2 22 22  GLUCOSE 118* 107*  BUN 37* 33*  CREATININE 1.12* 1.09  CALCIUM 9.1 8.9  MG 1.8  --    Liver Function Tests:  Recent Labs  07/21/14 2321  AST 16  ALT 9  ALKPHOS 64  BILITOT 1.0  PROT 7.2  ALBUMIN 3.1*    Recent Labs  07/21/14 2321  LIPASE 20   CBC:  Recent Labs  07/21/14 2321 07/22/14 0544  WBC 11.3* 10.0  NEUTROABS 9.1*  --   HGB 10.1* 9.3*  HCT 30.7* 28.4*  MCV 87.0 89.6  PLT 270 259   Cardiac Enzymes:  Recent Labs  07/21/14 2321  CKTOTAL 72  TROPONINI <0.30   BNP:  Recent Labs  07/21/14 2321  PROBNP 989.3*   D-Dimer: No results found for this basename: DDIMER,  in the last 72  hours Hemoglobin A1C: No results found for this basename: HGBA1C,  in the last 72 hours Fasting Lipid Panel: No results found for this basename: CHOL, HDL, LDLCALC, TRIG, CHOLHDL, LDLDIRECT,  in the last 72 hours Thyroid Function Tests:  Recent Labs  07/22/14 0544  TSH 1.140  T3FREE 2.0*     Radiology/Studies:  Dg Chest 2 View  07/22/2014   CLINICAL DATA:  LEG PAIN  EXAM: CHEST  2 VIEW  COMPARISON:  None.  FINDINGS: Mild cardiomegaly is present. Mediastinal silhouettes within normal limits.  The lungs are normally inflated. No airspace consolidation, pleural effusion, or pulmonary edema is identified. There is no pneumothorax.  No acute osseous abnormality identified.  IMPRESSION: 1. No acute cardiopulmonary abnormality. 2. Mild cardiomegaly.   Electronically Signed   By: Jeannine Boga M.D.   On: 07/22/2014 00:38   Echo:Study Conclusions  - Left ventricle: Abnormal septal motion. The cavity size was normal. Wall thickness was increased in a pattern  of mild LVH. Systolic function was normal. The estimated ejection fraction was in the range of 55% to 60%. Doppler parameters are consistent with abnormal left ventricular relaxation (grade 1 diastolic dysfunction). - Aortic valve: There was mild regurgitation. - Left atrium: The atrium was mildly dilated. - Atrial septum: No defect or patent foramen ovale was identified.   PHYSICAL EXAM General: Well developed, well nourished, in no acute distress. Head: Normocephalic, atraumatic, sclera non-icteric, oropharynx is clear Neck: Negative for carotid bruits. JVD not elevated. No adenopathy Lungs: Clear bilaterally to auscultation without wheezes, rales, or rhonchi. Breathing is unlabored. Heart: IRRR S1 S2 without murmurs, rubs, or gallops.  Abdomen: Soft, non-tender, non-distended with normoactive bowel sounds. No hepatomegaly. No rebound/guarding. No obvious abdominal masses. Msk:  Strength and tone appears normal for  age. Extremities: Mild right ankle edema.  Distal pedal pulses are 2+ and equal bilaterally. Neuro: Alert and oriented X 3. Moves all extremities spontaneously. Psych:  Responds to questions appropriately with a normal affect.  ASSESSMENT AND PLAN: 1. Paroxysmal atrial flutter. Rate controlled. Asymptomatic. In and out of sinus rhythm. Suspect triggered by infection/fever. No rate control needed. Observe for now and treat underlying infection. Recommend anticoagulation due to high CHAD-Vasc score. Would consider transition to NOAC.  2. ? Mild diastolic CHF on admission. Received one dose of lasix. Appears euvolemic. Echo unremarkable. Will monitor.   3. Fever probable UTI- per primary team.  Present on Admission:  . CHF (congestive heart failure) . Leg pain, left . Atrial flutter, unspecified . Diabetes mellitus type 2, controlled . HYPERTENSION, BENIGN . Left leg pain  Signed, Courtney Bradford, [place] 07/22/2014 1:40 PM

## 2014-07-23 ENCOUNTER — Inpatient Hospital Stay (HOSPITAL_COMMUNITY): Payer: PRIVATE HEALTH INSURANCE

## 2014-07-23 DIAGNOSIS — M79609 Pain in unspecified limb: Secondary | ICD-10-CM

## 2014-07-23 DIAGNOSIS — D649 Anemia, unspecified: Secondary | ICD-10-CM

## 2014-07-23 DIAGNOSIS — N390 Urinary tract infection, site not specified: Secondary | ICD-10-CM | POA: Diagnosis present

## 2014-07-23 DIAGNOSIS — I5031 Acute diastolic (congestive) heart failure: Secondary | ICD-10-CM

## 2014-07-23 LAB — CBC
HCT: 27.7 % — ABNORMAL LOW (ref 36.0–46.0)
HEMOGLOBIN: 9.2 g/dL — AB (ref 12.0–15.0)
MCH: 28.5 pg (ref 26.0–34.0)
MCHC: 33.2 g/dL (ref 30.0–36.0)
MCV: 85.8 fL (ref 78.0–100.0)
Platelets: 306 10*3/uL (ref 150–400)
RBC: 3.23 MIL/uL — AB (ref 3.87–5.11)
RDW: 12.2 % (ref 11.5–15.5)
WBC: 12.8 10*3/uL — ABNORMAL HIGH (ref 4.0–10.5)

## 2014-07-23 LAB — IRON AND TIBC
Iron: 13 ug/dL — ABNORMAL LOW (ref 42–135)
Saturation Ratios: 12 % — ABNORMAL LOW (ref 20–55)
TIBC: 110 ug/dL — ABNORMAL LOW (ref 250–470)
UIBC: 97 ug/dL — ABNORMAL LOW (ref 125–400)

## 2014-07-23 LAB — BASIC METABOLIC PANEL
Anion gap: 14 (ref 5–15)
BUN: 35 mg/dL — ABNORMAL HIGH (ref 6–23)
CHLORIDE: 103 meq/L (ref 96–112)
CO2: 24 meq/L (ref 19–32)
Calcium: 9.3 mg/dL (ref 8.4–10.5)
Creatinine, Ser: 1.27 mg/dL — ABNORMAL HIGH (ref 0.50–1.10)
GFR calc Af Amer: 45 mL/min — ABNORMAL LOW (ref >90)
GFR calc non Af Amer: 39 mL/min — ABNORMAL LOW (ref >90)
Glucose, Bld: 126 mg/dL — ABNORMAL HIGH (ref 70–99)
Potassium: 3.6 mEq/L — ABNORMAL LOW (ref 3.7–5.3)
SODIUM: 141 meq/L (ref 137–147)

## 2014-07-23 LAB — GLUCOSE, CAPILLARY
Glucose-Capillary: 123 mg/dL — ABNORMAL HIGH (ref 70–99)
Glucose-Capillary: 161 mg/dL — ABNORMAL HIGH (ref 70–99)
Glucose-Capillary: 188 mg/dL — ABNORMAL HIGH (ref 70–99)
Glucose-Capillary: 131 mg/dL — ABNORMAL HIGH (ref 70–99)

## 2014-07-23 LAB — HEMOGLOBIN A1C
Hgb A1c MFr Bld: 5.3 % (ref <5.7)
Mean Plasma Glucose: 105 mg/dL (ref <117)

## 2014-07-23 LAB — RETICULOCYTES
RBC.: 3.2 MIL/uL — AB (ref 3.87–5.11)
RETIC COUNT ABSOLUTE: 35.2 10*3/uL (ref 19.0–186.0)
Retic Ct Pct: 1.1 % (ref 0.4–3.1)

## 2014-07-23 LAB — HEPARIN LEVEL (UNFRACTIONATED)
HEPARIN UNFRACTIONATED: 0.3 [IU]/mL (ref 0.30–0.70)
Heparin Unfractionated: 0.13 IU/mL — ABNORMAL LOW (ref 0.30–0.70)
Heparin Unfractionated: <0.1 IU/mL — ABNORMAL LOW (ref 0.30–0.70)

## 2014-07-23 MED ORDER — POTASSIUM CHLORIDE CRYS ER 20 MEQ PO TBCR
40.0000 meq | EXTENDED_RELEASE_TABLET | Freq: Once | ORAL | Status: AC
  Administered 2014-07-23: 40 meq via ORAL
  Filled 2014-07-23: qty 2

## 2014-07-23 MED ORDER — COLCHICINE 0.6 MG PO TABS
0.6000 mg | ORAL_TABLET | Freq: Two times a day (BID) | ORAL | Status: DC
  Administered 2014-07-23 – 2014-07-24 (×3): 0.6 mg via ORAL
  Filled 2014-07-23 (×4): qty 1

## 2014-07-23 MED ORDER — PANTOPRAZOLE SODIUM 40 MG PO TBEC
40.0000 mg | DELAYED_RELEASE_TABLET | Freq: Every day | ORAL | Status: DC
  Administered 2014-07-23 – 2014-07-31 (×9): 40 mg via ORAL
  Filled 2014-07-23 (×9): qty 1

## 2014-07-23 MED ORDER — OXYCODONE HCL 5 MG PO TABS
5.0000 mg | ORAL_TABLET | ORAL | Status: DC | PRN
  Administered 2014-07-23: 5 mg via ORAL
  Administered 2014-07-24: 10 mg via ORAL
  Filled 2014-07-23: qty 2
  Filled 2014-07-23: qty 1

## 2014-07-23 MED ORDER — APIXABAN 5 MG PO TABS
5.0000 mg | ORAL_TABLET | Freq: Two times a day (BID) | ORAL | Status: DC
  Administered 2014-07-23 – 2014-07-24 (×3): 5 mg via ORAL
  Filled 2014-07-23 (×4): qty 1

## 2014-07-23 NOTE — Progress Notes (Signed)
VASCULAR LAB PRELIMINARY  PRELIMINARY  PRELIMINARY  PRELIMINARY  Bilateral lower extremity venous Dopplers completed.    Preliminary report:  There is no DVT or SVT noted in the bilateral lower extremities.   Malene Blaydes, RVT 07/23/2014, 2:57 PM

## 2014-07-23 NOTE — Progress Notes (Signed)
    Subjective:  Her main complaint is pain in her knees- Lt > Rt. She has been immobile for several days secondary to "gout".   Objective:  Vital Signs in the last 24 hours: Temp:  [100.8 F (38.2 C)-103 F (39.4 C)] 101.1 F (38.4 C) (09/24 [id number]) Pulse Rate:  [77-95] 87 (09/24 0637) Resp:  [18-81] 81 (09/24 0637) BP: (114-147)/(34-54) 138/54 mmHg (09/24 0637) SpO2:  [98 %-100 %] 100 % (09/24 0637) Weight:  [158 lb 11.7 oz (72 kg)] 158 lb 11.7 oz (72 kg) (09/24 [id number])  Intake/Output from previous day:  Intake/Output Summary (Last 24 hours) at 07/23/14 0934 Last data filed at 07/23/14 0000  Gross per 24 hour  Intake 683.83 ml  Output      0 ml  Net 683.83 ml    Physical Exam: General appearance: alert, cooperative, appears stated age and no distress Lungs: crackles Lt base Heart: irregularly irregular rhythm Extremities: Lt knee appears swollen, both knees tender   Rate: 45-80  Rhythm: atrial fibrillation  Lab Results:  Recent Labs  07/22/14 0544 07/23/14 0542  WBC 10.0 12.8*  HGB 9.3* 9.2*  PLT 259 306    Recent Labs  07/22/14 0544 07/23/14 0542  NA 143 141  K 3.8 3.6*  CL 107 103  CO2 22 24  GLUCOSE 107* 126*  BUN 33* 35*  CREATININE 1.09 1.27*    Recent Labs  07/21/14 2321  TROPONINI <0.30   No results found for this basename: INR,  in the last 72 hours  Imaging: Imaging results have been reviewed  Cardiac Studies:  Assessment/Plan:  55F followed by Dr Berdine Addison with DM, HTN, arthritis "gout" who developed knee pain and has been bed bound for past several days. She came to the ER with fever 07/21/14 and leg pain. Work up demonstrated mild CHF and new AF with CVR. Echo shows normal LVF.     Principal Problem:   Leg pain, left Active Problems:   Acute diastolic congestive heart failure   Atrial fibrillation-new 07/22/13- CVR- unknown duration   Diabetes mellitus type 2, controlled   Left leg pain- suspected acute gout Lt knee   UTI (urinary  tract infection)   HYPERTENSION   SUI (stress urinary incontinence, female)   HYPERTENSION, BENIGN    PLAN:  Acute diastolic CHF secondary to febrile illness currently appears to be compensated.  Plan is for Heparin-Coumadin and rate control (currently bradycardic on no rate control drugs).  Treatment of gout and UTI per primary service.   Kerin Ransom PA-C [place] [id number] 07/23/2014, 9:34 AM   Patient seen and examined and history reviewed. Agree with above findings and plan. Telemetry shows atrial flutter with controlled ventricular response alternating with NSR and some junctional rhythm. No significant pauses. Apparently CM notes Eliquis affordable. Can have pharmacy switch from IV heparin. Otherwise management of acute arthritis and UTI per primary team.   Audrina Marten [place], [place] 07/23/2014 10:53 AM

## 2014-07-23 NOTE — Progress Notes (Addendum)
ANTICOAGULATION CONSULT NOTE -Follow up Pharmacy Consult for Heparin Indication: atrial fibrillation  Allergies  Allergen Reactions  . Lactose Intolerance (Gi)     Upset stomach    Patient Measurements: Height: 5\' 3"  (160 cm) Weight: 158 lb 11.7 oz (72 kg) IBW/kg (Calculated) : 52.4 Heparin Dosing Weight: 67kg   Vital Signs: Temp: 101.1 F (38.4 C) (09/24 0637) Temp src: Oral (09/24 0637) BP: 138/54 mmHg (09/24 0637) Pulse Rate: 87 (09/24 0637)  Labs:  Recent Labs  07/21/14 2321 07/22/14 0544 07/22/14 1335 07/22/14 2342 07/23/14 0542  HGB 10.1* 9.3*  --   --  9.2*  HCT 30.7* 28.4*  --   --  27.7*  PLT 270 259  --   --  306  HEPARINUNFRC  --   --  0.25* 0.30 0.13*  CREATININE 1.12* 1.09  --   --   --   CKTOTAL 72  --   --   --   --   TROPONINI <0.30  --   --   --   --     Estimated Creatinine Clearance: 39.1 ml/min (by C-G formula based on Cr of 1.09).  Assessment: 80 yo female with Aflutter for heparin  Goal of Therapy:  Heparin level 0.3-0.7 units/ml Monitor platelets by anticoagulation protocol: Yes   Plan:  Continue Heparin at current rate Follow-up am labs.  Phillis Knack, PharmD, BCPS  07/23/2014   Addendum Level this morning subtherapeutic.  Will increase heparin 1150 units/hr  Check heparin level in 8 hours.   Phillis Knack, PharmD, BCPS 07/23/2014 7:14 AM

## 2014-07-23 NOTE — Progress Notes (Signed)
ANTICOAGULATION CONSULT NOTE -Follow up Pharmacy Consult :  Convert from IV heparin to APIXABAN Indication: atrial fibrillation  Allergies  Allergen Reactions  . Lactose Intolerance (Gi)     Upset stomach    Patient Measurements: Height: 5\' 3"  (160 cm) Weight: 158 lb 11.7 oz (72 kg) IBW/kg (Calculated) : 52.4 Heparin Dosing Weight: 67kg   Vital Signs: Temp: 99.4 F (37.4 C) (09/24 0834) Temp src: Oral (09/24 0834) BP: 118/52 mmHg (09/24 0834) Pulse Rate: 75 (09/24 0834)  Labs:  Recent Labs  07/21/14 2321 07/22/14 0544 07/22/14 1335 07/22/14 2342 07/23/14 0542  HGB 10.1* 9.3*  --   --  9.2*  HCT 30.7* 28.4*  --   --  27.7*  PLT 270 259  --   --  306  HEPARINUNFRC  --   --  0.25* 0.30 0.13*  CREATININE 1.12* 1.09  --   --  1.27*  CKTOTAL 72  --   --   --   --   TROPONINI <0.30  --   --   --   --     Estimated Creatinine Clearance: 33.6 ml/min (by C-G formula based on Cr of 1.27).  Assessment: 80 yo female with Aflutter currently receiving IV heparin.  Now to transition to oral agent, Apixaban for nonvalvular atrial fibrillation.  Weight 72 kg, age 74 y.o,  SCr = 1.27 (up from 1.09), CrCl ~ 33 ml/min.  If SCr rises to 1.5 or higher, the Apixaban dose will need to decreased to 2.5 mg po BID.   Goal of Therapy:  Monitor platelets by anticoagulation protocol: Yes   Plan:  DC IV heparin drip and start Apixaban (Eliquis) 5 mg po BID.  Monitor for signs and symptoms of bleeding and renal function changes. F/u SCr tomorrow AM.   Nicole Cella, RPh Clinical Pharmacist Pager: [phone]  07/23/2014 11:07 AM

## 2014-07-23 NOTE — Progress Notes (Signed)
[facility] Progress Note   Courtney Bradford [id number] DOB: 1931-01-22 DOA: 07/21/2014 PCP: Maggie Font, MD  Brief narrative: Courtney Bradford is a 79 y.o. female with DM, HTN, anemia admitted with b/l leg pain starting about 4 days prior to admission. She states it started in her right leg and then went to her left leg... In the ER she was found to have a-flutter and possible CHF and given Lasix. No h/o A-flutter in the past.    Subjective: Unable to move or put weight on the left ankle or leg.   Assessment/Plan:   left leg pain and swelling more than the right leg since saturday.  - etiology undetermined- arthritis? Gout - admitting doctor suspected cellulitis and started Vancomycin- no signs of cellulitis on my exam. Will D/c vancomycin.  venous duplex ordered and pending. X ray of the left ankle as it is warm and very tender. Son at bedside, reports similar gouty attacks.  Will start her on colchicine and pain control. Holding NSAIDS for renal insufficiency.       Atrial flutter, unspecified - rate controlled - TSH FT4 normal- free T3 slightly low- follow - check Xarelto, Pradaxa and Apixiban cost- check hemoccult pending.  Echocardiogram reveals LVEF of 555 and grade 1 diastolic dysfunction.     Diabetes mellitus type 2, controlled - cont sliding scale - CBG (last 3)   Recent Labs  07/22/14 1654 07/22/14 2113 07/23/14 0812  GLUCAP 119* 148* 123*      UTI- fevers - f/u culture- patient asymptomatic . Currently on rocephin. UA does show leukocytes and many bacteria. As per the patient and son at bedside, she has a h/o cystocele and rectocele and underwent transvaginal tape procedure in 2012, but she has recurrence of the prolapse with subsequent multiple urinary tract infections. She had cardiac complications during the procedure and does not want any more TVT'S to be done. She might benefit from prophylactic antibiotic on discharge.     HYPERTENSION,  BENIGN - Clonidine, aliskiren and Amlodipine  Mild diastolic heart failure: Appears to be compensated.   CKD 3 - follow  Iron deficiency anemia? - on Iron supplements at home.  Anemia panel ordered.   Hypokalemia: replete as needed.   PT eval when leg pain has improved.    Code Status: Full code Family Communication: son at bedside.  Disposition Plan: home DVT prophylaxis: IV heparin  Consultants: cardiology  Procedures: None  Antibiotics: Anti-infectives   Start     Dose/Rate Route Frequency Ordered Stop   07/22/14 1830  cefTRIAXone (ROCEPHIN) 1 g in dextrose 5 % 50 mL IVPB     1 g 100 mL/hr over 30 Minutes Intravenous Every 24 hours 07/22/14 1757     07/22/14 0500  vancomycin (VANCOCIN) IVPB 750 mg/150 ml premix     750 mg 150 mL/hr over 60 Minutes Intravenous Every 24 hours 07/22/14 0428           Objective: Filed Weights   07/21/14 2214 07/22/14 0350 07/23/14 0637  Weight: 68.04 kg (150 lb) 70.9 kg (156 lb 4.9 oz) 72 kg (158 lb 11.7 oz)    Intake/Output Summary (Last 24 hours) at 07/23/14 1024 Last data filed at 07/23/14 0000  Gross per 24 hour  Intake 683.83 ml  Output      0 ml  Net 683.83 ml     Vitals Filed Vitals:   07/22/14 1750 07/22/14 2118 07/23/14 0637 07/23/14 0834  BP: 130/54 114/34 138/54 118/52  Pulse:  95 77 87 75  Temp: 103 F (39.4 C) 101.6 F (38.7 C) 101.1 F (38.4 C) 99.4 F (37.4 C)  TempSrc: Oral Oral Oral   Resp: 18 18 81   Height:      Weight:   72 kg (158 lb 11.7 oz)   SpO2: 100% 98% 100%     Exam: General: No acute respiratory distress Lungs: Clear to auscultation bilaterally without wheezes or crackles Cardiovascular: Regular rate and rhythm without murmur gallop or rub normal S1 and S2 Abdomen: Nontender, nondistended, soft, bowel sounds positive, no rebound, no ascites, no appreciable mass Extremities: No significant cyanosis, clubbing, mild edema right leg  Data Reviewed: Basic Metabolic  Panel:  Recent Labs Lab 07/21/14 2321 07/22/14 0544 07/23/14 0542  NA 143 143 141  K 3.6* 3.8 3.6*  CL 104 107 103  CO2 22 22 24   GLUCOSE 118* 107* 126*  BUN 37* 33* 35*  CREATININE 1.12* 1.09 1.27*  CALCIUM 9.1 8.9 9.3  MG 1.8  --   --    Liver Function Tests:  Recent Labs Lab 07/21/14 2321  AST 16  ALT 9  ALKPHOS 64  BILITOT 1.0  PROT 7.2  ALBUMIN 3.1*    Recent Labs Lab 07/21/14 2321  LIPASE 20   No results found for this basename: AMMONIA,  in the last 168 hours CBC:  Recent Labs Lab 07/21/14 2321 07/22/14 0544 07/23/14 0542  WBC 11.3* 10.0 12.8*  NEUTROABS 9.1*  --   --   HGB 10.1* 9.3* 9.2*  HCT 30.7* 28.4* 27.7*  MCV 87.0 89.6 85.8  PLT 270 259 306   Cardiac Enzymes:  Recent Labs Lab 07/21/14 2321  CKTOTAL 72  TROPONINI <0.30   BNP (last 3 results)  Recent Labs  07/21/14 2321  PROBNP 989.3*   CBG:  Recent Labs Lab 07/22/14 0455 07/22/14 1126 07/22/14 1654 07/22/14 2113 07/23/14 0812  GLUCAP 109* 170* 119* 148* 123*    Recent Results (from the past 240 hour(s))  CULTURE, BLOOD (ROUTINE X 2)     Status: None   Collection Time    07/22/14  5:50 AM      Result Value Ref Range Status   Specimen Description BLOOD LEFT HAND   Final   Special Requests BOTTLES DRAWN AEROBIC ONLY 6CC   Final   Culture  Setup Time     Final   Value: 07/22/2014 08:38     Performed at [facility]   Culture     Final   Value:        BLOOD CULTURE RECEIVED NO GROWTH TO DATE CULTURE WILL BE HELD FOR 5 DAYS BEFORE ISSUING A FINAL NEGATIVE REPORT     Performed at [facility]   Report Status PENDING   Incomplete  CULTURE, BLOOD (ROUTINE X 2)     Status: None   Collection Time    07/22/14  5:55 AM      Result Value Ref Range Status   Specimen Description BLOOD RIGHT HAND   Final   Special Requests BOTTLES DRAWN AEROBIC ONLY [facility]   Final   Culture  Setup Time     Final   Value: 07/22/2014 08:37     Performed at [facility]    Culture     Final   Value:        BLOOD CULTURE RECEIVED NO GROWTH TO DATE CULTURE WILL BE HELD FOR 5 DAYS BEFORE ISSUING A FINAL NEGATIVE REPORT  Performed at [facility]   Report Status PENDING   Incomplete     Studies:  Recent x-ray studies have been reviewed in detail by the Attending Physician  Scheduled Meds:  Scheduled Meds: . aliskiren  150 mg Oral Daily  . amLODipine  5 mg Oral Daily  . cefTRIAXone (ROCEPHIN)  IV  1 g Intravenous Q24H  . [START ON 07/24/2014] cloNIDine  0.3 mg Transdermal Weekly  . colchicine  0.6 mg Oral BID  . ferrous fumarate  1 tablet Oral BID  . insulin aspart  0-9 Units Subcutaneous TID WC  . pantoprazole  40 mg Oral Q0600  . sodium chloride  3 mL Intravenous Q12H  . sodium chloride  3 mL Intravenous Q12H  . vancomycin  750 mg Intravenous Q24H   Continuous Infusions: . heparin 1,150 Units/hr (07/23/14 0810)    Time spent on care of this patient: 30 min   Srihitha Tagliaferri, MD 07/23/2014, 10:24 AM  LOS: 2 days   [facility] Office  [phone] Pager - Text Page per [email or web address]  If 7PM-7AM, please contact night-coverage [email or web address]

## 2014-07-24 DIAGNOSIS — R509 Fever, unspecified: Secondary | ICD-10-CM

## 2014-07-24 LAB — GLUCOSE, CAPILLARY
Glucose-Capillary: 139 mg/dL — ABNORMAL HIGH (ref 70–99)
Glucose-Capillary: 181 mg/dL — ABNORMAL HIGH (ref 70–99)
Glucose-Capillary: 154 mg/dL — ABNORMAL HIGH (ref 70–99)
Glucose-Capillary: 183 mg/dL — ABNORMAL HIGH (ref 70–99)

## 2014-07-24 LAB — HEPARIN LEVEL (UNFRACTIONATED): Heparin Unfractionated: >2.2 IU/mL — ABNORMAL HIGH (ref 0.30–0.70)

## 2014-07-24 LAB — FOLATE: FOLATE: 11 ng/mL

## 2014-07-24 LAB — CBC
HCT: 27.3 % — ABNORMAL LOW (ref 36.0–46.0)
Hemoglobin: 9 g/dL — ABNORMAL LOW (ref 12.0–15.0)
MCH: 28.4 pg (ref 26.0–34.0)
MCHC: 33 g/dL (ref 30.0–36.0)
MCV: 86.1 fL (ref 78.0–100.0)
Platelets: 337 10*3/uL (ref 150–400)
RBC: 3.17 MIL/uL — ABNORMAL LOW (ref 3.87–5.11)
RDW: 12.4 % (ref 11.5–15.5)
WBC: 12.5 10*3/uL — ABNORMAL HIGH (ref 4.0–10.5)

## 2014-07-24 LAB — BASIC METABOLIC PANEL
Anion gap: 14 (ref 5–15)
BUN: 47 mg/dL — ABNORMAL HIGH (ref 6–23)
CO2: 23 mEq/L (ref 19–32)
CREATININE: 1.55 mg/dL — AB (ref 0.50–1.10)
Calcium: 9.4 mg/dL (ref 8.4–10.5)
Chloride: 106 mEq/L (ref 96–112)
GFR, EST AFRICAN AMERICAN: 35 mL/min — AB (ref >90)
GFR, EST NON AFRICAN AMERICAN: 30 mL/min — AB (ref >90)
Glucose, Bld: 148 mg/dL — ABNORMAL HIGH (ref 70–99)
POTASSIUM: 4.4 meq/L (ref 3.7–5.3)
Sodium: 143 mEq/L (ref 137–147)

## 2014-07-24 LAB — FERRITIN: Ferritin: 1819 ng/mL — ABNORMAL HIGH (ref 10–291)

## 2014-07-24 LAB — VITAMIN B12: VITAMIN B 12: 312 pg/mL (ref 211–911)

## 2014-07-24 LAB — SEDIMENTATION RATE: SED RATE: 118 mm/h — AB (ref 0–22)

## 2014-07-24 MED ORDER — MUSCLE RUB 10-15 % EX CREA
TOPICAL_CREAM | Freq: Four times a day (QID) | CUTANEOUS | Status: DC
  Administered 2014-07-24 – 2014-07-31 (×22): via TOPICAL
  Filled 2014-07-24: qty 85

## 2014-07-24 MED ORDER — APIXABAN 2.5 MG PO TABS
2.5000 mg | ORAL_TABLET | Freq: Two times a day (BID) | ORAL | Status: DC
  Administered 2014-07-24 – 2014-07-27 (×6): 2.5 mg via ORAL
  Filled 2014-07-24 (×9): qty 1

## 2014-07-24 MED ORDER — METHYLPREDNISOLONE SODIUM SUCC 40 MG IJ SOLR
40.0000 mg | INTRAMUSCULAR | Status: DC
  Administered 2014-07-24: 40 mg via INTRAVENOUS
  Filled 2014-07-24 (×2): qty 1

## 2014-07-24 MED ORDER — APIXABAN 2.5 MG PO TABS
2.5000 mg | ORAL_TABLET | Freq: Two times a day (BID) | ORAL | Status: DC
  Filled 2014-07-24 (×2): qty 1

## 2014-07-24 MED ORDER — TRAMADOL HCL 50 MG PO TABS
50.0000 mg | ORAL_TABLET | Freq: Four times a day (QID) | ORAL | Status: DC | PRN
  Administered 2014-07-24 – 2014-07-28 (×3): 50 mg via ORAL
  Filled 2014-07-24 (×4): qty 1

## 2014-07-24 NOTE — Evaluation (Signed)
Physical Therapy Evaluation Patient Details Name: Courtney Bradford MRN: [id number] DOB: June 20, 1931 Today's Date: 07/24/2014   History of Present Illness  Pt is an 80 y.o. female with history of DM, HTN,  chronic anemia and arthritis who has been experiencing increasing pain in her left leg since last Saturday (4 days PTA). EKG revealed new onset atrial flutter.  Clinical Impression  Pt admitted with the above. Pt currently with functional limitations due to the deficits listed below (see PT Problem List). At the time of PT eval pt was able to perform transfer with +2 assist. Transfer to standing was unsuccessful at this time. Pt will benefit from skilled PT to increase their independence and safety with mobility to allow discharge to the venue listed below. Strongly feel that pt is not safe to return home at this time, and will require post-acute rehab at the SNF level to improve gross strength and safety with mobility. She is at a high risk of falls and recommending use of lift for OOB at this time.      Follow Up Recommendations SNF;Supervision/Assistance - 24 hour    Equipment Recommendations  Rolling walker with 5" wheels;3in1 (PT)    Recommendations for Other Services       Precautions / Restrictions Precautions Precautions: Fall Restrictions Weight Bearing Restrictions: No      Mobility  Bed Mobility Overal bed mobility: Needs Assistance;+2 for physical assistance Bed Mobility: Supine to Sit;Sit to Supine     Supine to sit: Mod assist;+2 for physical assistance Sit to supine: Max assist;+2 for physical assistance   General bed mobility comments: Pt anxious with mobility and wants to move her legs on her own with her hands, however she never actually progresses towards EOB. Required increased encouragement for pt to allow therapist to assist her.   Transfers Overall transfer level: Needs assistance Equipment used: Rolling walker (2 wheeled) Transfers: Sit to/from  Stand Sit to Stand: +2 physical assistance;Total assist         General transfer comment: Attempted sit<>stand x2 without success. Appeared that pt was not participating in the transfer, and hips did not clear bed.   Ambulation/Gait                Stairs            Wheelchair Mobility    Modified Rankin (Stroke Patients Only)       Balance                                             Pertinent Vitals/Pain Pain Assessment: Faces (Unable to rate on 0-10 scale) Faces Pain Scale: Hurts even more Pain Location: Bilateral LE's Pain Intervention(s): Monitored during session;Limited activity within patient's tolerance    Home Living Family/patient expects to be discharged to:: Skilled nursing facility Living Arrangements: Alone Available Help at Discharge: Family;Available PRN/intermittently                  Prior Function Level of Independence: Independent with assistive device(s)         Comments: Used cane all the time PTA per pt     Hand Dominance   Dominant Hand: Right    Extremity/Trunk Assessment   Upper Extremity Assessment: Defer to OT evaluation           Lower Extremity Assessment: Generalized weakness (Bilat pain and weakness R>L)  Cervical / Trunk Assessment: Kyphotic  Communication   Communication: No difficulties  Cognition Arousal/Alertness: Awake/alert Behavior During Therapy: WFL for tasks assessed/performed Overall Cognitive Status: Within Functional Limits for tasks assessed                      General Comments      Exercises        Assessment/Plan    PT Assessment Patient needs continued PT services  PT Diagnosis Difficulty walking;Acute pain   PT Problem List Decreased strength;Decreased range of motion;Decreased activity tolerance;Decreased balance;Decreased mobility;Decreased knowledge of use of DME;Decreased safety awareness;Decreased knowledge of precautions;Pain  PT  Treatment Interventions DME instruction;Stair training;Gait training;Functional mobility training;Therapeutic activities;Therapeutic exercise;Neuromuscular re-education;Patient/family education   PT Goals (Current goals can be found in the Care Plan section) Acute Rehab PT Goals Patient Stated Goal: To return home independently PT Goal Formulation: With patient Time For Goal Achievement: 08/07/14 Potential to Achieve Goals: Fair    Frequency Min 2X/week   Barriers to discharge Decreased caregiver support      Co-evaluation               End of Session Equipment Utilized During Treatment: Gait belt Activity Tolerance: Patient limited by fatigue;Patient limited by pain Patient left: in bed;with call bell/phone within reach;with bed alarm set;with nursing/sitter in room Nurse Communication: Mobility status         Time: [id number] PT Time Calculation (min): 20 min   Charges:   PT Evaluation $Initial PT Evaluation Tier I: 1 Procedure PT Treatments $Therapeutic Activity: 8-22 mins   PT G CodesJolyn Lent 07/24/2014, 5:21 PM  Jolyn Lent, PT, DPT Acute Rehabilitation Services Pager: [phone]

## 2014-07-24 NOTE — Progress Notes (Signed)
Patient c/o b/l lower extremity pain, rating it a 8/10. Medicated with 5 mg of Oxy Ir as per MD prn order. Will continue to monitor.

## 2014-07-24 NOTE — Discharge Instructions (Signed)

## 2014-07-24 NOTE — Progress Notes (Signed)
    Subjective:  No chest pain or SOB. Still with left knee pain.  Objective:  Vital Signs in the last 24 hours: Temp:  [99.2 F (37.3 C)-100.1 F (37.8 C)] 100.1 F (37.8 C) (09/25 0502) Pulse Rate:  [65-92] 92 (09/25 0502) Resp:  [16-17] 17 (09/25 0502) BP: (124-143)/(49-54) 143/53 mmHg (09/25 0502) SpO2:  [95 %-100 %] 100 % (09/25 0502) Weight:  [161 lb (73.029 kg)] 161 lb (73.029 kg) (09/25 0509)  Intake/Output from previous day:  Intake/Output Summary (Last 24 hours) at 07/24/14 1030 Last data filed at 07/24/14 [id number]  Gross per 24 hour  Intake    480 ml  Output    800 ml  Net   -320 ml    Physical Exam: General appearance: alert, cooperative, appears stated age and no distress Lungs: clear anteriorly Heart: irregularly irregular rhythm Extremities: Lt knee appears swollen, both knees tender  Telemetry: Rate: 45-80  Rhythm: Atrial fibrillation alternating with atrial flutter and NSR. No significant high rate episodes or pauses.   Lab Results:  Recent Labs  07/23/14 0542 07/24/14 0421  WBC 12.8* 12.5*  HGB 9.2* 9.0*  PLT 306 337    Recent Labs  07/23/14 0542 07/24/14 0421  NA 141 143  K 3.6* 4.4  CL 103 106  CO2 24 23  GLUCOSE 126* 148*  BUN 35* 47*  CREATININE 1.27* 1.55*    Recent Labs  07/21/14 2321  TROPONINI <0.30   No results found for this basename: INR,  in the last 72 hours  Imaging: Imaging results have been reviewed  Cardiac Studies:  Assessment/Plan:  67F followed by Dr Berdine Addison with DM, HTN, arthritis "gout" who developed knee pain and has been bed bound for past several days. She came to the ER with fever 07/21/14 and leg pain. Work up demonstrated mild CHF and new AF with CVR. Echo shows normal LVF.   1. Acute diastolic CHF. Patient only received one dose of IV lasix 20 mg on admission. Appears euvolemic now. EF normal by Echo. Would avoid diuretics given increase in creatinine.  2. Paroxysmal atrial fibrillation/flutter.  Asymptomatic. Rate controlled and no significant pauses. Avoid any rate slowing medication. Started on Eliquis yesterday. Unfortunately heparin was not DC'd. Will stop heparin. Due to age and renal dysfunction will need to be on lower dose of Eliquis.   3. Acute left knee pain. LE dopplers negative for DVT. Xray negative. Suspect gout. Per primary team.   Active Problems:   HYPERTENSION   SUI (stress urinary incontinence, female)   Acute diastolic congestive heart failure   Atrial fibrillation-new 07/22/13- CVR- unknown duration   Diabetes mellitus type 2, controlled   Left leg pain- suspected acute gout Lt knee   UTI (urinary tract infection)     Courtney Bradford, [place] 07/24/2014 10:30 AM

## 2014-07-24 NOTE — Progress Notes (Signed)
Patient is sleeping peacefully. Denies further c/o of b/l lower extremity pain. Maintained on telemetry and rhythm is Aflutter. Will continue to monitor.  Esperanza Heir, RN

## 2014-07-24 NOTE — Progress Notes (Signed)
[facility] Progress Note   Courtney Bradford:[id number] DOB: 17-Apr-1931 DOA: 07/21/2014 PCP: Maggie Font, MD  Brief narrative: Courtney Bradford is a 80 y.o. female with DM, HTN, anemia admitted with b/l leg pain starting about 4 days prior to admission. She states it started in her right leg and then went to her left leg... In the ER she was found to have a-flutter and possible CHF and given Lasix. No h/o A-flutter in the past.    Subjective: Still having pain in both legs. On exam, the pain is in he shins today.   Assessment/Plan:  B/l leg pain - etiology undetermined- suspecting gout or other inflammatory disorder - - will d/c colchicine as Creatine is rising and her pain is not improved despite a number of doses - ESR significantly elevated- will start Solumedrol and see if this resolves her pain - per son, Oxycodone is making her too sleepy- will d/c in favor of Tramadol  - admitting doctor suspected cellulitis and started Vancomycin- no signs of cellulitis on my exam therefore d/c'd on 9/23  - venous duplex ordered negative  - X ray of the left ankle normal    ARF -due to Lasix, poor PO intake and Colchicine- will d/c Colchicine and follow renal function.     Atrial flutter- new diagnosis - rate controlled - TSH FT4 normal- free T3 slightly low- follow -started on Apixiban Echocardiogram reveals LVEF of 555 and grade 1 diastolic dysfunction.   UTI- fevers -culture negative x 2- patient asymptomatic . Currently on rocephin. UA does show leukocytes and many bacteria. As per the patient and son at bedside, she has a h/o cystocele and rectocele and underwent transvaginal tape procedure in 2012, but she has recurrence of the prolapse with subsequent multiple urinary tract infections. She had cardiac complications during the procedure and does not want any more TVT'S to be done    Diabetes mellitus type 2, controlled - cont sliding scale - follow on steroids    HYPERTENSION, BENIGN - Clonidine, aliskiren and Amlodipine  Mild diastolic heart failure: Appears to be compensated.   CKD 3 - follow  Iron deficiency anemia? - on Iron supplements at home.  Anemia panel ordered and consistent with AOCD  - severely elevated ferretin suggestive of underlying inflammation...   Hypokalemia: replete as needed.   PT eval    Code Status: Full code Family Communication: son at bedside.  Disposition Plan: home DVT prophylaxis: IV heparin  Consultants: cardiology  Procedures: None  Antibiotics: Anti-infectives   Start     Dose/Rate Route Frequency Ordered Stop   07/22/14 1830  cefTRIAXone (ROCEPHIN) 1 g in dextrose 5 % 50 mL IVPB     1 g 100 mL/hr over 30 Minutes Intravenous Every 24 hours 07/22/14 1757     07/22/14 0500  vancomycin (VANCOCIN) IVPB 750 mg/150 ml premix  Status:  Discontinued     750 mg 150 mL/hr over 60 Minutes Intravenous Every 24 hours 07/22/14 0428 07/23/14 1028         Objective: Filed Weights   07/22/14 0350 07/23/14 0637 07/24/14 0509  Weight: 70.9 kg (156 lb 4.9 oz) 72 kg (158 lb 11.7 oz) 73.029 kg (161 lb)    Intake/Output Summary (Last 24 hours) at 07/24/14 1634 Last data filed at 07/24/14 0938  Gross per 24 hour  Intake    240 ml  Output      0 ml  Net    240 ml  Vitals Filed Vitals:   07/24/14 0220 07/24/14 0502 07/24/14 0509 07/24/14 1518  BP: 128/54 143/53  123/53  Pulse: 84 92  87  Temp: 99.5 F (37.5 C) 100.1 F (37.8 C)  99.3 F (37.4 C)  TempSrc: Oral Oral  Oral  Resp: '16 17  18  ' Height:      Weight:   73.029 kg (161 lb)   SpO2: 100% 100%  97%    Exam: General: No acute respiratory distress Lungs: Clear to auscultation bilaterally without wheezes or crackles Cardiovascular: Regular rate and rhythm without murmur gallop or rub normal S1 and S2 Abdomen: Nontender, nondistended, soft, bowel sounds positive, no rebound, no ascites, no appreciable mass Extremities: No significant  cyanosis, clubbing, mild edema right leg  Data Reviewed: Basic Metabolic Panel:  Recent Labs Lab 07/21/14 2321 07/22/14 0544 07/23/14 0542 07/24/14 0421  NA 143 143 141 143  K 3.6* 3.8 3.6* 4.4  CL 104 107 103 106  CO2 '22 22 24 23  ' GLUCOSE 118* 107* 126* 148*  BUN 37* 33* 35* 47*  CREATININE 1.12* 1.09 1.27* 1.55*  CALCIUM 9.1 8.9 9.3 9.4  MG 1.8  --   --   --    Liver Function Tests:  Recent Labs Lab 07/21/14 2321  AST 16  ALT 9  ALKPHOS 64  BILITOT 1.0  PROT 7.2  ALBUMIN 3.1*    Recent Labs Lab 07/21/14 2321  LIPASE 20   No results found for this basename: AMMONIA,  in the last 168 hours CBC:  Recent Labs Lab 07/21/14 2321 07/22/14 0544 07/23/14 0542 07/24/14 0421  WBC 11.3* 10.0 12.8* 12.5*  NEUTROABS 9.1*  --   --   --   HGB 10.1* 9.3* 9.2* 9.0*  HCT 30.7* 28.4* 27.7* 27.3*  MCV 87.0 89.6 85.8 86.1  PLT 270 259 306 337   Cardiac Enzymes:  Recent Labs Lab 07/21/14 2321  CKTOTAL 72  TROPONINI <0.30   BNP (last 3 results)  Recent Labs  07/21/14 2321  PROBNP 989.3*   CBG:  Recent Labs Lab 07/23/14 1046 07/23/14 1556 07/23/14 2206 07/24/14 0618 07/24/14 1116  GLUCAP 161* 188* 131* 139* 181*    Recent Results (from the past 240 hour(s))  CULTURE, BLOOD (ROUTINE X 2)     Status: None   Collection Time    07/22/14  5:50 AM      Result Value Ref Range Status   Specimen Description BLOOD LEFT HAND   Final   Special Requests BOTTLES DRAWN AEROBIC ONLY 6CC   Final   Culture  Setup Time     Final   Value: 07/22/2014 08:38     Performed at [facility]   Culture     Final   Value:        BLOOD CULTURE RECEIVED NO GROWTH TO DATE CULTURE WILL BE HELD FOR 5 DAYS BEFORE ISSUING A FINAL NEGATIVE REPORT     Performed at [facility]   Report Status PENDING   Incomplete  CULTURE, BLOOD (ROUTINE X 2)     Status: None   Collection Time    07/22/14  5:55 AM      Result Value Ref Range Status   Specimen Description BLOOD  RIGHT HAND   Final   Special Requests BOTTLES DRAWN AEROBIC ONLY [facility]   Final   Culture  Setup Time     Final   Value: 07/22/2014 08:37     Performed at [facility]  Culture     Final   Value:        BLOOD CULTURE RECEIVED NO GROWTH TO DATE CULTURE WILL BE HELD FOR 5 DAYS BEFORE ISSUING A FINAL NEGATIVE REPORT     Performed at [facility]   Report Status PENDING   Incomplete     Studies:  Recent x-ray studies have been reviewed in detail by the Attending Physician  Scheduled Meds:  Scheduled Meds: . aliskiren  150 mg Oral Daily  . amLODipine  5 mg Oral Daily  . apixaban  2.5 mg Oral BID  . cefTRIAXone (ROCEPHIN)  IV  1 g Intravenous Q24H  . cloNIDine  0.3 mg Transdermal Weekly  . ferrous fumarate  1 tablet Oral BID  . insulin aspart  0-9 Units Subcutaneous TID WC  . MUSCLE RUB   Topical QID  . pantoprazole  40 mg Oral Q0600  . sodium chloride  3 mL Intravenous Q12H  . sodium chloride  3 mL Intravenous Q12H   Continuous Infusions:    Time spent on care of this patient: 30 min   Penn Wynne, MD 07/24/2014, 4:34 PM  LOS: 3 days   [facility] Office  [phone] Pager - Text Page per [email or web address]  If 7PM-7AM, please contact night-coverage [email or web address]

## 2014-07-24 NOTE — Progress Notes (Signed)
ANTICOAGULATION CONSULT NOTE -Follow up Pharmacy Consult : APIXABAN Indication: atrial fibrillation  Allergies  Allergen Reactions  . Lactose Intolerance (Gi)     Upset stomach    Patient Measurements: Height: 5\' 3"  (160 cm) Weight: 161 lb (73.029 kg) (bed scale; pt c/o knee pain) IBW/kg (Calculated) : 52.4 Heparin Dosing Weight: 67kg   Vital Signs: Temp: 100.1 F (37.8 C) (09/25 0502) Temp src: Oral (09/25 0502) BP: 143/53 mmHg (09/25 0502) Pulse Rate: 92 (09/25 0502)  Labs:  Recent Labs  07/21/14 2321 07/22/14 0544  07/23/14 0542 07/23/14 1224 07/24/14 0421  HGB 10.1* 9.3*  --  9.2*  --  9.0*  HCT 30.7* 28.4*  --  27.7*  --  27.3*  PLT 270 259  --  306  --  337  HEPARINUNFRC  --   --   < > 0.13* <0.10* >2.20*  CREATININE 1.12* 1.09  --  1.27*  --  1.55*  CKTOTAL 72  --   --   --   --   --   TROPONINI <0.30  --   --   --   --   --   < > = values in this interval not displayed.  Estimated Creatinine Clearance: 27.7 ml/min (by C-G formula based on Cr of 1.55).  Assessment: 80 yo female with Aflutter transitioned to oral agent, Apixaban for nonvalvular atrial fibrillation yesterday.   Weight 72 kg, age 61 y.o,  SCr = 1.55> 1.27>1.09  , the Apixaban dose will need to decreased to 2.5 mg po BID.   Hg 9, pltc 337 - stable, no bleeding reported.   Goal of Therapy:  Monitor platelets by anticoagulation protocol: Yes   Plan:  Decrease  start Apixaban (Eliquis) to 2.5 mg po BID. D/w Dr. [place] - in agreement to decrease dose.  Eudelia Bunch, Pharm.D. BP:[id number] 07/24/2014 11:09 AM

## 2014-07-25 LAB — GLUCOSE, CAPILLARY
GLUCOSE-CAPILLARY: 179 mg/dL — AB (ref 70–99)
Glucose-Capillary: 245 mg/dL — ABNORMAL HIGH (ref 70–99)
Glucose-Capillary: 294 mg/dL — ABNORMAL HIGH (ref 70–99)
Glucose-Capillary: 226 mg/dL — ABNORMAL HIGH (ref 70–99)

## 2014-07-25 LAB — BASIC METABOLIC PANEL
Anion gap: 16 — ABNORMAL HIGH (ref 5–15)
BUN: 56 mg/dL — ABNORMAL HIGH (ref 6–23)
CO2: 20 meq/L (ref 19–32)
Calcium: 10 mg/dL (ref 8.4–10.5)
Chloride: 101 mEq/L (ref 96–112)
Creatinine, Ser: 1.32 mg/dL — ABNORMAL HIGH (ref 0.50–1.10)
GFR calc Af Amer: 43 mL/min — ABNORMAL LOW (ref >90)
GFR calc non Af Amer: 37 mL/min — ABNORMAL LOW (ref >90)
Glucose, Bld: 243 mg/dL — ABNORMAL HIGH (ref 70–99)
POTASSIUM: 4.2 meq/L (ref 3.7–5.3)
SODIUM: 137 meq/L (ref 137–147)

## 2014-07-25 LAB — CBC
HCT: 28.4 % — ABNORMAL LOW (ref 36.0–46.0)
Hemoglobin: 9.5 g/dL — ABNORMAL LOW (ref 12.0–15.0)
MCH: 28.7 pg (ref 26.0–34.0)
MCHC: 33.5 g/dL (ref 30.0–36.0)
MCV: 85.8 fL (ref 78.0–100.0)
Platelets: 405 10*3/uL — ABNORMAL HIGH (ref 150–400)
RBC: 3.31 MIL/uL — ABNORMAL LOW (ref 3.87–5.11)
RDW: 12.4 % (ref 11.5–15.5)
WBC: 15.3 10*3/uL — ABNORMAL HIGH (ref 4.0–10.5)

## 2014-07-25 LAB — URINE CULTURE
COLONY COUNT: NO GROWTH
CULTURE: NO GROWTH

## 2014-07-25 MED ORDER — PREDNISONE 20 MG PO TABS
40.0000 mg | ORAL_TABLET | Freq: Every day | ORAL | Status: DC
  Administered 2014-07-26: 40 mg via ORAL
  Filled 2014-07-25 (×2): qty 2

## 2014-07-25 NOTE — Progress Notes (Signed)
The patient's heart rhythm went from A. Flutter to SB according to the monitor.  Jonette Eva was notified and orders were written for an EKG.  By the time the EKG was taken, the patient had converted back to A. Flutter.  The EKG is in the patient's chart.

## 2014-07-25 NOTE — Progress Notes (Signed)
[facility] Progress Note   BEYONKA PITNEY HUD:[id number] DOB: [birth year] DOA: 07/21/2014 PCP: Maggie Font, MD  Brief narrative: CLARISSA LAIRD is a 80 y.o. female with DM, HTN, anemia admitted with b/l leg pain starting about 4 days prior to admission. She states it started in her right leg and then went to her left leg... In the ER she was found to have a-flutter and possible CHF and given Lasix. No h/o A-flutter in the past.    Subjective: Pain mostly resolved today- noted to be sitting up in a chair.   Assessment/Plan:  B/l leg pain -  suspecting disseminated gout or other inflammatory disorder -  d/c'd colchicine as Creatine was rising  - ESR significantly elevated- started Solumedrol resulting in significant improvement in pain- will place on Prednisone taper - per son, Oxycodone is making her too sleepy-  d/c in favor of Tramadol  - admitting doctor suspected cellulitis and started Vancomycin- no signs of cellulitis on my exam therefore d/c'd on 9/23  - venous duplex ordered negative  - X ray of the left ankle normal    ARF -due to Lasix, poor PO intake and Colchicine- d/c'd  - renal function improved     Atrial flutter- new diagnosis - rate controlled - TSH FT4 normal- free T3 slightly low- follow -started on Apixiban Echocardiogram reveals LVEF of 555 and grade 1 diastolic dysfunction.   UTI- fevers -culture negative x 2- patient asymptomatic . Currently on rocephin started 9/23- will give 7 day course for complicated UTI - As per the patient and son at bedside, she has a h/o cystocele and rectocele and underwent transvaginal tape procedure in 2012, but she has recurrence of the prolapse with subsequent multiple urinary tract infections. S    Diabetes mellitus type 2, controlled - cont sliding scale - follow on steroids     HYPERTENSION, BENIGN - Clonidine, aliskiren and Amlodipine  Mild diastolic heart failure: Appears to be compensated.   CKD 3 -  follow  Iron deficiency anemia? - on Iron supplements at home.  Anemia panel ordered and consistent with AOCD  - severely elevated ferretin suggestive of underlying inflammation.  Hypokalemia: replete as needed.   PT eval    Code Status: Full code Family Communication: son at bedside.  Disposition Plan: home-  DVT prophylaxis: Eliquis  Consultants: cardiology  Procedures: None  Antibiotics: Anti-infectives   Start     Dose/Rate Route Frequency Ordered Stop   07/22/14 1830  cefTRIAXone (ROCEPHIN) 1 g in dextrose 5 % 50 mL IVPB     1 g 100 mL/hr over 30 Minutes Intravenous Every 24 hours 07/22/14 1757     07/22/14 0500  vancomycin (VANCOCIN) IVPB 750 mg/150 ml premix  Status:  Discontinued     750 mg 150 mL/hr over 60 Minutes Intravenous Every 24 hours 07/22/14 0428 07/23/14 1028         Objective: Filed Weights   07/23/14 0637 07/24/14 0509 07/25/14 0511  Weight: 72 kg (158 lb 11.7 oz) 73.029 kg (161 lb) 73.619 kg (162 lb 4.8 oz)    Intake/Output Summary (Last 24 hours) at 07/25/14 1429 Last data filed at 07/25/14 1331  Gross per 24 hour  Intake    680 ml  Output    220 ml  Net    460 ml     Vitals Filed Vitals:   07/24/14 1846 07/24/14 2036 07/25/14 0511 07/25/14 1330  BP: 134/45 142/58 179/57 140/64  Pulse: 86 94 80 82  Temp: 100 F (37.8 C) 98.4 F (36.9 C) 98.1 F (36.7 C) 98.1 F (36.7 C)  TempSrc: Oral Oral Oral Oral  Resp: _0 Height:      Weight:   73.619 kg (162 lb 4.8 oz)   SpO2: 98% 98% 97% 97%    Exam: General: No acute respiratory distress Lungs: Clear to auscultation bilaterally without wheezes or crackles Cardiovascular: Regular rate and rhythm without murmur gallop or rub normal S1 and S2 Abdomen: Nontender, nondistended, soft, bowel sounds positive, no rebound, no ascites, no appreciable mass Extremities: No significant cyanosis, clubbing or edema in LE  Data Reviewed: Basic Metabolic Panel:  Recent Labs Lab  07/21/14 2321 07/22/14 0544 07/23/14 0542 07/24/14 0421 07/25/14 0840  NA 143 143 141 143 137  K 3.6* 3.8 3.6* 4.4 4.2  CL 104 107 103 106 101  CO2 _1 GLUCOSE 118* 107* 126* 148* 243*  BUN 37* 33* 35* 47* 56*  CREATININE 1.12* 1.09 1.27* 1.55* 1.32*  CALCIUM 9.1 8.9 9.3 9.4 10.0  MG 1.8  --   --   --   --    Liver Function Tests:  Recent Labs Lab 07/21/14 2321  AST 16  ALT 9  ALKPHOS 64  BILITOT 1.0  PROT 7.2  ALBUMIN 3.1*    Recent Labs Lab 07/21/14 2321  LIPASE 20   No results found for this basename: AMMONIA,  in the last 168 hours CBC:  Recent Labs Lab 07/21/14 2321 07/22/14 0544 07/23/14 0542 07/24/14 0421 07/25/14 0840  WBC 11.3* 10.0 12.8* 12.5* 15.3*  NEUTROABS 9.1*  --   --   --   --   HGB 10.1* 9.3* 9.2* 9.0* 9.5*  HCT 30.7* 28.4* 27.7* 27.3* 28.4*  MCV 87.0 89.6 85.8 86.1 85.8  PLT 270 259 306 337 405*   Cardiac Enzymes:  Recent Labs Lab 07/21/14 2321  CKTOTAL 72  TROPONINI <0.30   BNP (last 3 results)  Recent Labs  07/21/14 2321  PROBNP 989.3*   CBG:  Recent Labs Lab 07/24/14 1116 07/24/14 1644 07/24/14 2105 07/25/14 0640 07/25/14 1113  GLUCAP 181* 154* 183* 245* 294*    Recent Results (from the past 240 hour(s))  CULTURE, BLOOD (ROUTINE X 2)     Status: None   Collection Time    07/22/14  5:50 AM      Result Value Ref Range Status   Specimen Description BLOOD LEFT HAND   Final   Special Requests BOTTLES DRAWN AEROBIC ONLY 6CC   Final   Culture  Setup Time     Final   Value: 07/22/2014 08:38     Performed at [facility]   Culture     Final   Value:        BLOOD CULTURE RECEIVED NO GROWTH TO DATE CULTURE WILL BE HELD FOR 5 DAYS BEFORE ISSUING A FINAL NEGATIVE REPORT     Performed at [facility]   Report Status PENDING   Incomplete  CULTURE, BLOOD (ROUTINE X 2)     Status: None   Collection Time    07/22/14  5:55 AM      Result Value Ref Range Status   Specimen Description BLOOD  RIGHT HAND   Final   Special Requests BOTTLES DRAWN AEROBIC ONLY [facility]   Final   Culture  Setup Time     Final   Value: 07/22/2014 08:37     Performed at [facility]  Culture     Final   Value:        BLOOD CULTURE RECEIVED NO GROWTH TO DATE CULTURE WILL BE HELD FOR 5 DAYS BEFORE ISSUING A FINAL NEGATIVE REPORT     Performed at [facility]   Report Status PENDING   Incomplete     Studies:  Recent x-ray studies have been reviewed in detail by the Attending Physician  Scheduled Meds:  Scheduled Meds: . aliskiren  150 mg Oral Daily  . amLODipine  5 mg Oral Daily  . apixaban  2.5 mg Oral BID  . cefTRIAXone (ROCEPHIN)  IV  1 g Intravenous Q24H  . cloNIDine  0.3 mg Transdermal Weekly  . ferrous fumarate  1 tablet Oral BID  . insulin aspart  0-9 Units Subcutaneous TID WC  . methylPREDNISolone (SOLU-MEDROL) injection  40 mg Intravenous Q24H  . MUSCLE RUB   Topical QID  . pantoprazole  40 mg Oral Q0600  . sodium chloride  3 mL Intravenous Q12H  . sodium chloride  3 mL Intravenous Q12H   Continuous Infusions:    Time spent on care of this patient: 30 min   [place], MD 07/25/2014, 2:29 PM  LOS: 4 days   [facility] Office  [phone] Pager - Text Page per [email or web address]  If 7PM-7AM, please contact night-coverage [email or web address]

## 2014-07-25 NOTE — Progress Notes (Signed)
Primary cardiologist:  Dr. Iona Beard  Subjective:   Patient sitting in bedside chair, states that her legs feel somewhat better. No palpitations.   Objective:   Temp:  [98.1 F (36.7 C)-100 F (37.8 C)] 98.1 F (36.7 C) (09/26 0511) Pulse Rate:  [80-94] 80 (09/26 0511) Resp:  [18-19] 19 (09/26 0511) BP: (123-179)/(45-58) 179/57 mmHg (09/26 0511) SpO2:  [97 %-98 %] 97 % (09/26 0511) Weight:  [162 lb 4.8 oz (73.619 kg)] 162 lb 4.8 oz (73.619 kg) (09/26 0511) Last BM Date: 07/22/14  Filed Weights   07/23/14 0637 07/24/14 0509 07/25/14 0511  Weight: 158 lb 11.7 oz (72 kg) 161 lb (73.029 kg) 162 lb 4.8 oz (73.619 kg)    Intake/Output Summary (Last 24 hours) at 07/25/14 1158 Last data filed at 07/25/14 0849  Gross per 24 hour  Intake    360 ml  Output      0 ml  Net    360 ml    Telemetry: Coarse atrial fibrillation.  Exam:  General: No distress.  Lungs: Clear, nonlabored.  Cardiac: Irregularly irregular.  Extremities: Mild knee swelling.   Lab Results:  Basic Metabolic Panel:  Recent Labs Lab 07/21/14 2321  07/23/14 0542 07/24/14 0421 07/25/14 0840  NA 143  < > 141 143 137  K 3.6*  < > 3.6* 4.4 4.2  CL 104  < > 103 106 101  CO2 22  < > 24 23 20   GLUCOSE 118*  < > 126* 148* 243*  BUN 37*  < > 35* 47* 56*  CREATININE 1.12*  < > 1.27* 1.55* 1.32*  CALCIUM 9.1  < > 9.3 9.4 10.0  MG 1.8  --   --   --   --   < > = values in this interval not displayed.  Liver Function Tests:  Recent Labs Lab 07/21/14 2321  AST 16  ALT 9  ALKPHOS 64  BILITOT 1.0  PROT 7.2  ALBUMIN 3.1*    CBC:  Recent Labs Lab 07/23/14 0542 07/24/14 0421 07/25/14 0840  WBC 12.8* 12.5* 15.3*  HGB 9.2* 9.0* 9.5*  HCT 27.7* 27.3* 28.4*  MCV 85.8 86.1 85.8  PLT 306 337 405*    Cardiac Enzymes:  Recent Labs Lab 07/21/14 2321  CKTOTAL 72  TROPONINI <0.30    BNP:  Recent Labs  07/21/14 2321  PROBNP 989.3*    Echocardiogram (07/22/14):  Study  Conclusions  - Left ventricle: Abnormal septal motion. The cavity size was normal. Wall thickness was increased in a pattern of mild LVH. Systolic function was normal. The estimated ejection fraction was in the range of 55% to 60%. Doppler parameters are consistent with abnormal left ventricular relaxation (grade 1 diastolic dysfunction). - Aortic valve: There was mild regurgitation. - Left atrium: The atrium was mildly dilated. - Atrial septum: No defect or patent foramen ovale was identified.    Medications:   Scheduled Medications: . aliskiren  150 mg Oral Daily  . amLODipine  5 mg Oral Daily  . apixaban  2.5 mg Oral BID  . cefTRIAXone (ROCEPHIN)  IV  1 g Intravenous Q24H  . cloNIDine  0.3 mg Transdermal Weekly  . ferrous fumarate  1 tablet Oral BID  . insulin aspart  0-9 Units Subcutaneous TID WC  . methylPREDNISolone (SOLU-MEDROL) injection  40 mg Intravenous Q24H  . MUSCLE RUB   Topical QID  . pantoprazole  40 mg Oral Q0600  . sodium chloride  3 mL Intravenous Q12H  .  sodium chloride  3 mL Intravenous Q12H      PRN Medications:  acetaminophen, acetaminophen, morphine injection, ondansetron (ZOFRAN) IV, ondansetron, traMADol   Assessment:   1. Persistent coarse atrial fibrillation/flutter. Low-dose ELiquis started. Heart rate generally controlled without specific intervention, some intermittent tachycardia noted in the 100-110 range.  2. Possible component of diastolic heart failure in association with problem #1. LVEF normal.  3. UTI, on antibiotics.  4. CKD, stage 3.   Plan/Discussion:    Patient continues on Eliquis, no specific rate control medication this time. Keep an eye on heart rate control.   Satira Sark, M.D., F.A.C.C.

## 2014-07-26 LAB — GLUCOSE, CAPILLARY
Glucose-Capillary: 130 mg/dL — ABNORMAL HIGH (ref 70–99)
Glucose-Capillary: 284 mg/dL — ABNORMAL HIGH (ref 70–99)
Glucose-Capillary: 180 mg/dL — ABNORMAL HIGH (ref 70–99)
Glucose-Capillary: 172 mg/dL — ABNORMAL HIGH (ref 70–99)

## 2014-07-26 LAB — CBC WITH DIFFERENTIAL/PLATELET
BASOS PCT: 0 % (ref 0–1)
Basophils Absolute: 0 10*3/uL (ref 0.0–0.1)
EOS ABS: 0 10*3/uL (ref 0.0–0.7)
Eosinophils Relative: 0 % (ref 0–5)
HEMATOCRIT: 28.4 % — AB (ref 36.0–46.0)
HEMOGLOBIN: 9.3 g/dL — AB (ref 12.0–15.0)
Lymphocytes Relative: 6 % — ABNORMAL LOW (ref 12–46)
Lymphs Abs: 0.7 10*3/uL (ref 0.7–4.0)
MCH: 29 pg (ref 26.0–34.0)
MCHC: 32.7 g/dL (ref 30.0–36.0)
MCV: 88.5 fL (ref 78.0–100.0)
MONO ABS: 0.4 10*3/uL (ref 0.1–1.0)
MONOS PCT: 4 % (ref 3–12)
Neutro Abs: 11 10*3/uL — ABNORMAL HIGH (ref 1.7–7.7)
Neutrophils Relative %: 90 % — ABNORMAL HIGH (ref 43–77)
Platelets: 499 10*3/uL — ABNORMAL HIGH (ref 150–400)
RBC: 3.21 MIL/uL — ABNORMAL LOW (ref 3.87–5.11)
RDW: 12.6 % (ref 11.5–15.5)
WBC: 12.2 10*3/uL — ABNORMAL HIGH (ref 4.0–10.5)

## 2014-07-26 MED ORDER — PREDNISONE 20 MG PO TABS
30.0000 mg | ORAL_TABLET | Freq: Every day | ORAL | Status: DC
  Administered 2014-07-27 – 2014-07-30 (×4): 30 mg via ORAL
  Filled 2014-07-26 (×5): qty 1

## 2014-07-26 MED ORDER — INSULIN ASPART 100 UNIT/ML ~~LOC~~ SOLN
0.0000 [IU] | Freq: Three times a day (TID) | SUBCUTANEOUS | Status: DC
  Administered 2014-07-26: 8 [IU] via SUBCUTANEOUS
  Administered 2014-07-26 – 2014-07-27 (×2): 3 [IU] via SUBCUTANEOUS
  Administered 2014-07-27: 8 [IU] via SUBCUTANEOUS
  Administered 2014-07-28: 11 [IU] via SUBCUTANEOUS
  Administered 2014-07-28 – 2014-07-29 (×2): 8 [IU] via SUBCUTANEOUS
  Administered 2014-07-29: 5 [IU] via SUBCUTANEOUS
  Administered 2014-07-30: 11 [IU] via SUBCUTANEOUS
  Administered 2014-07-30: 5 [IU] via SUBCUTANEOUS
  Administered 2014-07-31: 8 [IU] via SUBCUTANEOUS

## 2014-07-26 NOTE — Progress Notes (Signed)
The patient's HR briefly went down to 38-39.  She was asymptomatic and asleep at the time. Her rhythm was A. Flutter and her HR went back up into the 60s upon waking.  Her VS are stable and were charted.  Courtney Bradford was notified.  The RN will continue to monitor the patient's HR.

## 2014-07-26 NOTE — Progress Notes (Signed)
[facility] Progress Note   [name]:[id number] DOB: 1931-03-12 DOA: 07/21/2014 PCP: Maggie Font, MD  Brief narrative: Courtney Bradford is a 80 y.o. female with DM, HTN, anemia admitted with b/l leg pain starting about 4 days prior to admission. She states it started in her right leg and then went to her left leg... In the ER she was found to have a-flutter and possible CHF and given Lasix. No h/o A-flutter in the past.    Subjective: Pain continues to improve- able to ambulate today - awaiting PT eval.   Assessment/Plan:  B/l leg pain -  suspecting disseminated gout or other inflammatory disorder- ANA pending -  d/Courtney'd colchicine as Creatine was rising  - ESR significantly elevated- started Solumedrol resulting in significant improvement in pain- transitioned to Prednisone taper - per son, Oxycodone is making her too sleepy-  d/Courtney'd in favor of Tramadol  - admitting doctor suspected cellulitis and started Vancomycin- no signs of cellulitis on my exam therefore d/Courtney'd on 9/23  - venous duplex ordered negative  - X ray of the left ankle normal    ARF -due to Lasix, poor PO intake and Colchicine- d/Courtney'd  - renal function improved     Atrial flutter- new diagnosis - rate controlled - TSH FT4 normal- free T3 slightly low- follow -started on Apixiban Echocardiogram reveals LVEF of 55% and grade 1 diastolic dysfunction.   UTI- fevers -culture negative x 2- patient asymptomatic . Currently on rocephin started 9/23- will give 7 day course for complicated UTI - As per the patient and son at bedside, she has a h/o cystocele and rectocele and underwent transvaginal tape procedure in 2012, but she has recurrence of the prolapse with subsequent multiple urinary tract infections. S    Diabetes mellitus type 2, controlled - cont sliding scale - follow on steroids     HYPERTENSION, BENIGN - Clonidine, aliskiren and Amlodipine  Mild diastolic heart failure: Appears to be  compensated.   CKD 3 - follow  Iron deficiency anemia? - on Iron supplements at home.  Anemia panel ordered and consistent with AOCD  - severely elevated ferretin suggestive of underlying inflammation.  Hypokalemia: replete as needed.    Code Status: Full code Family Communication: son at bedside.  Disposition Plan: home-  DVT prophylaxis: Eliquis  Consultants: cardiology  Procedures: None  Antibiotics: Anti-infectives   Start     Dose/Rate Route Frequency Ordered Stop   07/22/14 1830  cefTRIAXone (ROCEPHIN) 1 g in dextrose 5 % 50 mL IVPB     1 g 100 mL/hr over 30 Minutes Intravenous Every 24 hours 07/22/14 1757     07/22/14 0500  vancomycin (VANCOCIN) IVPB 750 mg/150 ml premix  Status:  Discontinued     750 mg 150 mL/hr over 60 Minutes Intravenous Every 24 hours 07/22/14 0428 07/23/14 1028         Objective: Filed Weights   07/24/14 0509 07/25/14 0511 07/26/14 0524  Weight: 73.029 kg (161 lb) 73.619 kg (162 lb 4.8 oz) 71 kg (156 lb 8.4 oz)    Intake/Output Summary (Last 24 hours) at 07/26/14 0942 Last data filed at 07/25/14 2049  Gross per 24 hour  Intake    860 ml  Output    221 ml  Net    639 ml     Vitals Filed Vitals:   07/25/14 1330 07/25/14 2044 07/26/14 0140 07/26/14 0524  BP: 140/64 142/65 128/54 144/64  Pulse: 82 90  62  Temp: 98.1  F (36.7 Courtney) 99 F (37.2 Courtney)  98.2 F (36.8 Courtney)  TempSrc: Oral Oral  Oral  Resp: '19 18 16 18  ' Height:      Weight:    71 kg (156 lb 8.4 oz)  SpO2: 97% 100% 100% 100%    Exam: General: No acute respiratory distress Lungs: Clear to auscultation bilaterally without wheezes or crackles Cardiovascular: Regular rate and rhythm without murmur gallop or rub normal S1 and S2 Abdomen: Nontender, nondistended, soft, bowel sounds positive, no rebound, no ascites, no appreciable mass Extremities: No significant cyanosis, clubbing or edema in LE  Data Reviewed: Basic Metabolic Panel:  Recent Labs Lab 07/21/14 2321  07/22/14 0544 07/23/14 0542 07/24/14 0421 07/25/14 0840  NA 143 143 141 143 137  K 3.6* 3.8 3.6* 4.4 4.2  CL 104 107 103 106 101  CO2 '22 22 24 23 20  ' GLUCOSE 118* 107* 126* 148* 243*  BUN 37* 33* 35* 47* 56*  CREATININE 1.12* 1.09 1.27* 1.55* 1.32*  CALCIUM 9.1 8.9 9.3 9.4 10.0  MG 1.8  --   --   --   --    Liver Function Tests:  Recent Labs Lab 07/21/14 2321  AST 16  ALT 9  ALKPHOS 64  BILITOT 1.0  PROT 7.2  ALBUMIN 3.1*    Recent Labs Lab 07/21/14 2321  LIPASE 20   No results found for this basename: AMMONIA,  in the last 168 hours CBC:  Recent Labs Lab 07/21/14 2321 07/22/14 0544 07/23/14 0542 07/24/14 0421 07/25/14 0840 07/26/14 0840  WBC 11.3* 10.0 12.8* 12.5* 15.3* 12.2*  NEUTROABS 9.1*  --   --   --   --  11.0*  HGB 10.1* 9.3* 9.2* 9.0* 9.5* 9.3*  HCT 30.7* 28.4* 27.7* 27.3* 28.4* 28.4*  MCV 87.0 89.6 85.8 86.1 85.8 88.5  PLT 270 259 306 337 405* 499*   Cardiac Enzymes:  Recent Labs Lab 07/21/14 2321  CKTOTAL 72  TROPONINI <0.30   BNP (last 3 results)  Recent Labs  07/21/14 2321  PROBNP 989.3*   CBG:  Recent Labs Lab 07/25/14 0640 07/25/14 1113 07/25/14 1547 07/25/14 2046 07/26/14 0559  GLUCAP 245* 294* 226* 179* 130*    Recent Results (from the past 240 hour(s))  CULTURE, BLOOD (ROUTINE X 2)     Status: None   Collection Time    07/22/14  5:50 AM      Result Value Ref Range Status   Specimen Description BLOOD LEFT HAND   Final   Special Requests BOTTLES DRAWN AEROBIC ONLY 6CC   Final   Culture  Setup Time     Final   Value: 07/22/2014 08:38     Performed at [facility]   Culture     Final   Value:        BLOOD CULTURE RECEIVED NO GROWTH TO DATE CULTURE WILL BE HELD FOR 5 DAYS BEFORE ISSUING A FINAL NEGATIVE REPORT     Performed at [facility]   Report Status PENDING   Incomplete  CULTURE, BLOOD (ROUTINE X 2)     Status: None   Collection Time    07/22/14  5:55 AM      Result Value Ref Range Status    Specimen Description BLOOD RIGHT HAND   Final   Special Requests BOTTLES DRAWN AEROBIC ONLY [facility]   Final   Culture  Setup Time     Final   Value: 07/22/2014 08:37     Performed at  [facility]     Final   Value:        BLOOD CULTURE RECEIVED NO GROWTH TO DATE CULTURE WILL BE HELD FOR 5 DAYS BEFORE ISSUING A FINAL NEGATIVE REPORT     Performed at [facility]   Report Status PENDING   Incomplete  URINE CULTURE     Status: None   Collection Time    07/24/14  5:32 AM      Result Value Ref Range Status   Specimen Description URINE, CATHETERIZED   Final   Special Requests NONE   Final   Culture  Setup Time     Final   Value: 07/24/2014 09:28     Performed at [place]     Final   Value: NO GROWTH     Performed at [facility]   Culture     Final   Value: NO GROWTH     Performed at [facility]   Report Status 07/25/2014 FINAL   Final     Studies:  Recent x-ray studies have been reviewed in detail by the Attending Physician  Scheduled Meds:  Scheduled Meds: . aliskiren  150 mg Oral Daily  . amLODipine  5 mg Oral Daily  . apixaban  2.5 mg Oral BID  . cefTRIAXone (ROCEPHIN)  IV  1 g Intravenous Q24H  . cloNIDine  0.3 mg Transdermal Weekly  . ferrous fumarate  1 tablet Oral BID  . insulin aspart  0-15 Units Subcutaneous TID WC  . MUSCLE RUB   Topical QID  . pantoprazole  40 mg Oral Q0600  . predniSONE  40 mg Oral Q breakfast  . sodium chloride  3 mL Intravenous Q12H  . sodium chloride  3 mL Intravenous Q12H   Continuous Infusions:    Time spent on care of this patient: 30 min   [place], MD 07/26/2014, 9:42 AM  LOS: 5 days   [facility] Office  [phone] Pager - Text Page per [email or web address]  If 7PM-7AM, please contact night-coverage [email or web address]

## 2014-07-26 NOTE — Progress Notes (Signed)
Primary cardiologist:  Dr. Iona Beard  Subjective:   Patient states that her legs feel somewhat better, has been able to ambulate. No palpitations.   Objective:   Temp:  [98.1 F (36.7 C)-99 F (37.2 C)] 98.2 F (36.8 C) (09/27 0524) Pulse Rate:  [62-90] 62 (09/27 0524) Resp:  [16-19] 18 (09/27 0524) BP: (128-144)/(54-65) 144/64 mmHg (09/27 0524) SpO2:  [97 %-100 %] 100 % (09/27 0524) Weight:  [156 lb 8.4 oz (71 kg)] 156 lb 8.4 oz (71 kg) (09/27 0524) Last BM Date: 07/25/14  Filed Weights   07/24/14 0509 07/25/14 0511 07/26/14 0524  Weight: 161 lb (73.029 kg) 162 lb 4.8 oz (73.619 kg) 156 lb 8.4 oz (71 kg)    Intake/Output Summary (Last 24 hours) at 07/26/14 1007 Last data filed at 07/25/14 2049  Gross per 24 hour  Intake    860 ml  Output    221 ml  Net    639 ml    Telemetry: Coarse atrial fibrillation.  Exam:  General: No distress.  Lungs: Clear, nonlabored.  Cardiac: Irregularly irregular.  Extremities: Mild knee swelling.   Lab Results:  Basic Metabolic Panel:  Recent Labs Lab 07/21/14 2321  07/23/14 0542 07/24/14 0421 07/25/14 0840  NA 143  < > 141 143 137  K 3.6*  < > 3.6* 4.4 4.2  CL 104  < > 103 106 101  CO2 22  < > 24 23 20   GLUCOSE 118*  < > 126* 148* 243*  BUN 37*  < > 35* 47* 56*  CREATININE 1.12*  < > 1.27* 1.55* 1.32*  CALCIUM 9.1  < > 9.3 9.4 10.0  MG 1.8  --   --   --   --   < > = values in this interval not displayed.  Liver Function Tests:  Recent Labs Lab 07/21/14 2321  AST 16  ALT 9  ALKPHOS 64  BILITOT 1.0  PROT 7.2  ALBUMIN 3.1*    CBC:  Recent Labs Lab 07/24/14 0421 07/25/14 0840 07/26/14 0840  WBC 12.5* 15.3* 12.2*  HGB 9.0* 9.5* 9.3*  HCT 27.3* 28.4* 28.4*  MCV 86.1 85.8 88.5  PLT 337 405* 499*    Cardiac Enzymes:  Recent Labs Lab 07/21/14 2321  CKTOTAL 72  TROPONINI <0.30    BNP:  Recent Labs  07/21/14 2321  PROBNP 989.3*    Echocardiogram (07/22/14):  Study Conclusions  -  Left ventricle: Abnormal septal motion. The cavity size was normal. Wall thickness was increased in a pattern of mild LVH. Systolic function was normal. The estimated ejection fraction was in the range of 55% to 60%. Doppler parameters are consistent with abnormal left ventricular relaxation (grade 1 diastolic dysfunction). - Aortic valve: There was mild regurgitation. - Left atrium: The atrium was mildly dilated. - Atrial septum: No defect or patent foramen ovale was identified.    Medications:   Scheduled Medications: . aliskiren  150 mg Oral Daily  . amLODipine  5 mg Oral Daily  . apixaban  2.5 mg Oral BID  . cefTRIAXone (ROCEPHIN)  IV  1 g Intravenous Q24H  . cloNIDine  0.3 mg Transdermal Weekly  . ferrous fumarate  1 tablet Oral BID  . insulin aspart  0-15 Units Subcutaneous TID WC  . MUSCLE RUB   Topical QID  . pantoprazole  40 mg Oral Q0600  . [START ON 07/27/2014] predniSONE  30 mg Oral Q breakfast  . sodium chloride  3 mL Intravenous Q12H  .  sodium chloride  3 mL Intravenous Q12H     PRN Medications: acetaminophen, acetaminophen, morphine injection, ondansetron (ZOFRAN) IV, ondansetron, traMADol   Assessment:   1. Persistent coarse atrial fibrillation/flutter. Low-dose ELiquis started. Heart rate generally controlled without specific intervention. No plan for cardioversion at this time.  2. Possible component of diastolic heart failure in association with problem #1. LVEF normal.  3. UTI, on antibiotics.  4. CKD, stage 3.   Plan/Discussion:    Continue Eliquis, no specific rate control medication this time. Keep an eye on heart rate control. Ambulate as tolerated.   Satira Sark, M.D., F.A.C.C.

## 2014-07-27 DIAGNOSIS — R0602 Shortness of breath: Secondary | ICD-10-CM

## 2014-07-27 LAB — BASIC METABOLIC PANEL
Anion gap: 11 (ref 5–15)
Anion gap: 13 (ref 5–15)
BUN: 59 mg/dL — ABNORMAL HIGH (ref 6–23)
BUN: 56 mg/dL — AB (ref 6–23)
CALCIUM: 9.5 mg/dL (ref 8.4–10.5)
CHLORIDE: 106 meq/L (ref 96–112)
CO2: 24 mEq/L (ref 19–32)
CO2: 23 mEq/L (ref 19–32)
CREATININE: 1.23 mg/dL — AB (ref 0.50–1.10)
CREATININE: 1.33 mg/dL — AB (ref 0.50–1.10)
Calcium: 9.8 mg/dL (ref 8.4–10.5)
Chloride: 104 mEq/L (ref 96–112)
GFR calc non Af Amer: 40 mL/min — ABNORMAL LOW (ref >90)
GFR, EST AFRICAN AMERICAN: 47 mL/min — AB (ref >90)
GFR, EST AFRICAN AMERICAN: 43 mL/min — AB (ref >90)
GFR, EST NON AFRICAN AMERICAN: 37 mL/min — AB (ref >90)
Glucose, Bld: 130 mg/dL — ABNORMAL HIGH (ref 70–99)
Glucose, Bld: 154 mg/dL — ABNORMAL HIGH (ref 70–99)
POTASSIUM: 4.7 meq/L (ref 3.7–5.3)
Potassium: 4.3 mEq/L (ref 3.7–5.3)
Sodium: 141 mEq/L (ref 137–147)
Sodium: 140 mEq/L (ref 137–147)

## 2014-07-27 LAB — GLUCOSE, CAPILLARY
GLUCOSE-CAPILLARY: 152 mg/dL — AB (ref 70–99)
Glucose-Capillary: 107 mg/dL — ABNORMAL HIGH (ref 70–99)
Glucose-Capillary: 258 mg/dL — ABNORMAL HIGH (ref 70–99)
Glucose-Capillary: 198 mg/dL — ABNORMAL HIGH (ref 70–99)

## 2014-07-27 LAB — ANA: Anti Nuclear Antibody(ANA): NEGATIVE

## 2014-07-27 LAB — MAGNESIUM: Magnesium: 1.9 mg/dL (ref 1.5–2.5)

## 2014-07-27 MED ORDER — SODIUM CHLORIDE 0.9 % IV SOLN: 250.0000 mL | INTRAVENOUS | Status: DC | PRN

## 2014-07-27 MED ORDER — DOFETILIDE 125 MCG PO CAPS
125.0000 ug | ORAL_CAPSULE | Freq: Two times a day (BID) | ORAL | Status: DC
  Administered 2014-07-27 – 2014-07-30 (×6): 125 ug via ORAL
  Filled 2014-07-27 (×9): qty 1

## 2014-07-27 MED ORDER — SODIUM CHLORIDE 0.9 % IJ SOLN
3.0000 mL | Freq: Two times a day (BID) | INTRAMUSCULAR | Status: DC
  Administered 2014-07-28 – 2014-07-31 (×7): 3 mL via INTRAVENOUS

## 2014-07-27 MED ORDER — AMIODARONE HCL 200 MG PO TABS
200.0000 mg | ORAL_TABLET | Freq: Two times a day (BID) | ORAL | Status: DC
  Administered 2014-07-27: 200 mg via ORAL
  Filled 2014-07-27 (×2): qty 1

## 2014-07-27 MED ORDER — DOFETILIDE 125 MCG PO CAPS
125.0000 ug | ORAL_CAPSULE | Freq: Two times a day (BID) | ORAL | Status: DC
  Filled 2014-07-27 (×2): qty 1

## 2014-07-27 MED ORDER — PREDNISONE 10 MG PO TABS: 20.0000 mg | ORAL_TABLET | Freq: Every day | ORAL | Status: DC

## 2014-07-27 MED ORDER — SODIUM CHLORIDE 0.9 % IJ SOLN: 3.0000 mL | INTRAMUSCULAR | Status: DC | PRN

## 2014-07-27 MED ORDER — APIXABAN 5 MG PO TABS
5.0000 mg | ORAL_TABLET | Freq: Two times a day (BID) | ORAL | Status: DC
  Administered 2014-07-27 – 2014-07-31 (×8): 5 mg via ORAL
  Filled 2014-07-27 (×10): qty 1

## 2014-07-27 MED ORDER — INFLUENZA VAC SPLIT QUAD 0.5 ML IM SUSY
0.5000 mL | PREFILLED_SYRINGE | INTRAMUSCULAR | Status: AC
  Administered 2014-07-28: 0.5 mL via INTRAMUSCULAR
  Filled 2014-07-27: qty 0.5

## 2014-07-27 MED ORDER — DOFETILIDE 250 MCG PO CAPS: 250.0000 ug | ORAL_CAPSULE | Freq: Two times a day (BID) | ORAL | Status: DC

## 2014-07-27 MED ORDER — APIXABAN 2.5 MG PO TABS: 2.5000 mg | ORAL_TABLET | Freq: Two times a day (BID) | ORAL | Status: DC

## 2014-07-27 NOTE — Progress Notes (Signed)
[facility] Progress Note   Courtney Bradford MRN:[id number] DOB: 09/17/1931 DOA: 07/21/2014 PCP: HILL,GERALD K, MD  Brief narrative: Courtney Bradford is a 80 y.o. female with DM, HTN, anemia admitted with b/l leg pain starting about 4 days prior to admission. She states it started in her right leg and then went to her left leg... In the ER she was found to have a-flutter and possible CHF and given Lasix. No h/o A-flutter in the past.    Subjective: No complaints - doing well  Assessment/Plan:  B/l leg pain -  suspecting disseminated gout or other inflammatory disorder- ANA pending -  d/c'd colchicine as Creatine was rising  - ESR significantly elevated- started Solumedrol resulting in significant improvement in pain- transitioned to Prednisone taper - per son, Oxycodone is making her too sleepy-  d/c'd in favor of Tramadol  - admitting doctor suspected cellulitis and started Vancomycin- no signs of cellulitis on my exam therefore d/c'd on 9/23  - venous duplex ordered negative  - X ray of the left ankle normal    ARF -due to Lasix, poor PO intake and Colchicine- d/c'd  - renal function improved     Atrial flutter- new diagnosis - TSH FT4 normal- free T3 slightly low- follow -started on Apixiban - started on Amio today by cardiology Echocardiogram reveals LVEF of 55% and grade 1 diastolic dysfunction.   UTI- fevers -culture negative x 2- patient asymptomatic . Currently on rocephin started 9/23- will give 7 day course for complicated UTI - As per the patient and son at bedside, she has a h/o cystocele and rectocele and underwent transvaginal tape procedure in 2012, but she has recurrence of the prolapse with subsequent multiple urinary tract infections. S    Diabetes mellitus type 2, controlled - cont sliding scale - follow on steroids     HYPERTENSION, BENIGN - Clonidine, aliskiren and Amlodipine  Mild diastolic heart failure: Appears to be compensated.   CKD 3 -  follow  Iron deficiency anemia? - on Iron supplements at home.  Anemia panel ordered and consistent with AOCD  - severely elevated ferretin suggestive of underlying inflammation.  Hypokalemia: replete as needed.    Code Status: Full code Family Communication: son at bedside.  Disposition Plan: home-  DVT prophylaxis: Eliquis  Consultants: cardiology  Procedures: None  Antibiotics: Anti-infectives   Start     Dose/Rate Route Frequency Ordered Stop   07/22/14 1830  cefTRIAXone (ROCEPHIN) 1 g in dextrose 5 % 50 mL IVPB     1 g 100 mL/hr over 30 Minutes Intravenous Every 24 hours 07/22/14 1757     07/22/14 0500  vancomycin (VANCOCIN) IVPB 750 mg/150 ml premix  Status:  Discontinued     750 mg 150 mL/hr over 60 Minutes Intravenous Every 24 hours 07/22/14 0428 07/23/14 1028         Objective: Filed Weights   07/25/14 0511 07/26/14 0524 07/27/14 0546  Weight: 73.619 kg (162 lb 4.8 oz) 71 kg (156 lb 8.4 oz) 70.7 kg (155 lb 13.8 oz)    Intake/Output Summary (Last 24 hours) at 07/27/14 1457 Last data filed at 07/27/14 1328  Gross per 24 hour  Intake    240 ml  Output      0 ml  Net    240 ml     Vitals Filed Vitals:   07/26/14 0524 07/26/14 1430 07/26/14 1934 07/27/14 0546  BP: 144/64 137/51 138/48 127/50  Pulse: 62 76 83 54  Temp: 98.2   F (36.8 C) 98.3 F (36.8 C) 98.9 F (37.2 C) 98.4 F (36.9 C)  TempSrc: Oral Oral Oral Oral  Resp: 18 20 20 20  Height:      Weight: 71 kg (156 lb 8.4 oz)   70.7 kg (155 lb 13.8 oz)  SpO2: 100% 100% 99% 100%    Exam: General: No acute respiratory distress Lungs: Clear to auscultation bilaterally without wheezes or crackles Cardiovascular: Regular rate and rhythm without murmur gallop or rub normal S1 and S2 Abdomen: Nontender, nondistended, soft, bowel sounds positive, no rebound, no ascites, no appreciable mass Extremities: No significant cyanosis, clubbing or edema in LE  Data Reviewed: Basic Metabolic  Panel:  Recent Labs Lab 07/21/14 2321 07/22/14 0544 07/23/14 0542 07/24/14 0421 07/25/14 0840 07/27/14 0407  NA 143 143 141 143 137 141  K 3.6* 3.8 3.6* 4.4 4.2 4.3  CL 104 107 103 106 101 106  CO2 22 22 24 23 20 24  GLUCOSE 118* 107* 126* 148* 243* 130*  BUN 37* 33* 35* 47* 56* 59*  CREATININE 1.12* 1.09 1.27* 1.55* 1.32* 1.23*  CALCIUM 9.1 8.9 9.3 9.4 10.0 9.5  MG 1.8  --   --   --   --   --    Liver Function Tests:  Recent Labs Lab 07/21/14 2321  AST 16  ALT 9  ALKPHOS 64  BILITOT 1.0  PROT 7.2  ALBUMIN 3.1*    Recent Labs Lab 07/21/14 2321  LIPASE 20   No results found for this basename: AMMONIA,  in the last 168 hours CBC:  Recent Labs Lab 07/21/14 2321 07/22/14 0544 07/23/14 0542 07/24/14 0421 07/25/14 0840 07/26/14 0840  WBC 11.3* 10.0 12.8* 12.5* 15.3* 12.2*  NEUTROABS 9.1*  --   --   --   --  11.0*  HGB 10.1* 9.3* 9.2* 9.0* 9.5* 9.3*  HCT 30.7* 28.4* 27.7* 27.3* 28.4* 28.4*  MCV 87.0 89.6 85.8 86.1 85.8 88.5  PLT 270 259 306 337 405* 499*   Cardiac Enzymes:  Recent Labs Lab 07/21/14 2321  CKTOTAL 72  TROPONINI <0.30   BNP (last 3 results)  Recent Labs  07/21/14 2321  PROBNP 989.3*   CBG:  Recent Labs Lab 07/26/14 1111 07/26/14 1611 07/26/14 2050 07/27/14 0550 07/27/14 1104  GLUCAP 284* 180* 172* 107* 258*    Recent Results (from the past 240 hour(s))  CULTURE, BLOOD (ROUTINE X 2)     Status: None   Collection Time    07/22/14  5:50 AM      Result Value Ref Range Status   Specimen Description BLOOD LEFT HAND   Final   Special Requests BOTTLES DRAWN AEROBIC ONLY 6CC   Final   Culture  Setup Time     Final   Value: 07/22/2014 08:38     Performed at [facility]   Culture     Final   Value:        BLOOD CULTURE RECEIVED NO GROWTH TO DATE CULTURE WILL BE HELD FOR 5 DAYS BEFORE ISSUING A FINAL NEGATIVE REPORT     Performed at [facility]   Report Status PENDING   Incomplete  CULTURE, BLOOD (ROUTINE X  2)     Status: None   Collection Time    07/22/14  5:55 AM      Result Value Ref Range Status   Specimen Description BLOOD RIGHT HAND   Final   Special Requests BOTTLES DRAWN AEROBIC ONLY 7CC   Final     Culture  Setup Time     Final   Value: 07/22/2014 08:37     Performed at [facility]   Culture     Final   Value:        BLOOD CULTURE RECEIVED NO GROWTH TO DATE CULTURE WILL BE HELD FOR 5 DAYS BEFORE ISSUING A FINAL NEGATIVE REPORT     Performed at [facility]   Report Status PENDING   Incomplete  URINE CULTURE     Status: None   Collection Time    07/24/14  5:32 AM      Result Value Ref Range Status   Specimen Description URINE, CATHETERIZED   Final   Special Requests NONE   Final   Culture  Setup Time     Final   Value: 07/24/2014 09:28     Performed at [facility]   Colony Count     Final   Value: NO GROWTH     Performed at [facility]   Culture     Final   Value: NO GROWTH     Performed at [facility]   Report Status 07/25/2014 FINAL   Final     Studies:  Recent x-ray studies have been reviewed in detail by the Attending Physician  Scheduled Meds:  Scheduled Meds: . aliskiren  150 mg Oral Daily  . amiodarone  200 mg Oral BID  . amLODipine  5 mg Oral Daily  . apixaban  5 mg Oral BID  . cefTRIAXone (ROCEPHIN)  IV  1 g Intravenous Q24H  . cloNIDine  0.3 mg Transdermal Weekly  . ferrous fumarate  1 tablet Oral BID  . [START ON 07/28/2014] Influenza vac split quadrivalent PF  0.5 mL Intramuscular Tomorrow-1000  . insulin aspart  0-15 Units Subcutaneous TID WC  . MUSCLE RUB   Topical QID  . pantoprazole  40 mg Oral Q0600  . predniSONE  30 mg Oral Q breakfast  . sodium chloride  3 mL Intravenous Q12H  . sodium chloride  3 mL Intravenous Q12H   Continuous Infusions:    Time spent on care of this patient: 30 min   ,, MD 07/27/2014, 2:57 PM  LOS: 6 days   [facility] Office  [phone] Pager -  Text Page per [email or web address]  If 7PM-7AM, please contact night-coverage [email or web address]        

## 2014-07-27 NOTE — Progress Notes (Signed)
Patient seen and personally examined and agree with note as outlined by Sharrell Ku, PA-C.    Agree that patient's Apixiban should be increased to 5mg  BID (her creatinine is <1.5 and weight is >60kg).  I would recommend close followup with Dr. Martinique to follow BMET closely on NOAC.  She continues to have episodes of afib with RVR as recent as 10am today up to 145bpm.  I think at this time we need to add antiarrhythmic drug therapy.  I will add Amio 200mg  BID and keep on tele to watch for bradyarrhythmia.  Would hold on discharge today.

## 2014-07-27 NOTE — Progress Notes (Addendum)
Discussed with EP and given significant bradycardia at night will stop Amio and start Tikosyn 126mcg BID.  Will check Mg level before 1st dose.  She has only gotten one dose of Amio.  QTC ok in NSR this am.  Her CrCl is 40 so will place on 161mcg BID and follow QTc and renal function closely.

## 2014-07-27 NOTE — Progress Notes (Signed)
Physical Therapy Treatment Patient Details Name: CARRI SPILLERS MRN: [id number] DOB: 06/02/1931 Today's Date: 07/27/2014    History of Present Illness      PT Comments    Pt has significantly improved with mobility since initial eval.  She was able to ambulate 40 feet with RW min guard assist and required min assist for transfers.  Previous goals met and new goals written.  D/C plan updated to home with 24-hour family assist and HHPT.  Pt will need RW for home use, which she may already have.  Follow Up Recommendations  Home health PT;Supervision/Assistance - 24 hour     Equipment Recommendations  Rolling walker with 5" wheels;3in1 (PT)    Recommendations for Other Services       Precautions / Restrictions Precautions Precautions: Fall    Mobility  Bed Mobility               General bed mobility comments: Pt up in recliner upon arrival. She was assisted OOB by nursing.  Transfers   Equipment used: Rolling walker (2 wheeled)   Sit to Stand: Min assist         General transfer comment: verbal cues for hand placement, increased time required  Ambulation/Gait Ambulation/Gait assistance: Min guard Ambulation Distance (Feet): 40 Feet Assistive device: Rolling walker (2 wheeled) Gait Pattern/deviations: Shuffle;Decreased stride length;Trunk flexed Gait velocity: very slow   General Gait Details: continuous verbal cues for posture   Stairs            Wheelchair Mobility    Modified Rankin (Stroke Patients Only)       Balance                                    Cognition Arousal/Alertness: Awake/alert Behavior During Therapy: WFL for tasks assessed/performed Overall Cognitive Status: Within Functional Limits for tasks assessed                      Exercises      General Comments        Pertinent Vitals/Pain Pain Assessment: No/denies pain    Home Living                      Prior Function             PT Goals (current goals can now be found in the care plan section) Progress towards PT goals: Goals met and updated - see care plan    Frequency  Min 3X/week    PT Plan Discharge plan needs to be updated    Co-evaluation             End of Session Equipment Utilized During Treatment: Gait belt Activity Tolerance: Patient tolerated treatment well Patient left: in chair;with call bell/phone within reach     Time: 0906-0920 PT Time Calculation (min): 14 min  Charges:  $Gait Training: 8-22 mins                    G Codes:      Lorriane Shire 07/27/2014, 9:48 AM

## 2014-07-27 NOTE — Progress Notes (Signed)
Patient: Courtney Bradford / Admit Date: 07/21/2014 / Date of Encounter: 07/27/2014, 12:14 PM   Subjective: Feels good. No complaints. She is occasionally able to feel some palpitations but is not particularly bothered by them.  Objective: Telemetry: continues to go in and out of AF - rates 100-140s when in AF, also with some transient nocturnal bradycardia HR 30's only during sleeping hours, waking HR when in NSR- 60s Physical Exam: Blood pressure 127/50, pulse 54, temperature 98.4 F (36.9 C), temperature source Oral, resp. rate 20, height 5\' 3"  (1.6 m), weight 155 lb 13.8 oz (70.7 kg), SpO2 100.00%. General: Well developed, well nourished AAF in no acute distress. Head: Normocephalic, atraumatic, sclera non-icteric, no xanthomas, nares are without discharge. Neck: Negative for carotid bruits. JVP not elevated. Lungs: Clear bilaterally to auscultation without wheezes, rales, or rhonchi. Breathing is unlabored. Heart: RRR S1 S2 without murmurs, rubs, or gallops.  Abdomen: Soft, non-tender, non-distended with normoactive bowel sounds. No rebound/guarding. Extremities: No clubbing or cyanosis. No edema. Distal pedal pulses are 2+ and equal bilaterally. Neuro: Alert and oriented X 3. Moves all extremities spontaneously. Psych:  Responds to questions appropriately with a normal affect.   Intake/Output Summary (Last 24 hours) at 07/27/14 1214 Last data filed at 07/27/14 0914  Gross per 24 hour  Intake    360 ml  Output      1 ml  Net    359 ml    Inpatient Medications:  . aliskiren  150 mg Oral Daily  . amLODipine  5 mg Oral Daily  . apixaban  2.5 mg Oral BID  . cefTRIAXone (ROCEPHIN)  IV  1 g Intravenous Q24H  . cloNIDine  0.3 mg Transdermal Weekly  . ferrous fumarate  1 tablet Oral BID  . [START ON 07/28/2014] Influenza vac split quadrivalent PF  0.5 mL Intramuscular Tomorrow-1000  . insulin aspart  0-15 Units Subcutaneous TID WC  . MUSCLE RUB   Topical QID  . pantoprazole  40 mg  Oral Q0600  . predniSONE  30 mg Oral Q breakfast  . sodium chloride  3 mL Intravenous Q12H  . sodium chloride  3 mL Intravenous Q12H   Infusions:    Labs:  Recent Labs  07/25/14 0840 07/27/14 0407  NA 137 141  K 4.2 4.3  CL 101 106  CO2 20 24  GLUCOSE 243* 130*  BUN 56* 59*  CREATININE 1.32* 1.23*  CALCIUM 10.0 9.5    Recent Labs  07/25/14 0840 07/26/14 0840  WBC 15.3* 12.2*  NEUTROABS  --  11.0*  HGB 9.5* 9.3*  HCT 28.4* 28.4*  MCV 85.8 88.5  PLT 405* 499*   Radiology/Studies:  Dg Chest 2 View  07/22/2014   CLINICAL DATA:  LEG PAIN  EXAM: CHEST  2 VIEW  COMPARISON:  None.  FINDINGS: Mild cardiomegaly is present. Mediastinal silhouettes within normal limits.  The lungs are normally inflated. No airspace consolidation, pleural effusion, or pulmonary edema is identified. There is no pneumothorax.  No acute osseous abnormality identified.  IMPRESSION: 1. No acute cardiopulmonary abnormality. 2. Mild cardiomegaly.   Electronically Signed   By: Jeannine Boga M.D.   On: 07/22/2014 00:38   Dg Ankle Complete Left  07/23/2014   CLINICAL DATA:  Left ankle swelling  EXAM: LEFT ANKLE COMPLETE - 3+ VIEW  COMPARISON:  None.  FINDINGS: Three views of left ankle submitted. No acute fracture or subluxation. There is diffuse osteopenia. Ankle mortise is preserved. Small posterior and plantar spur of calcaneus.  IMPRESSION: No acute fracture or subluxation.  Diffuse osteopenia.   Electronically Signed   By: Lahoma Crocker M.D.   On: 07/23/2014 11:17     Assessment and Plan  80 y/o F with history of DM, HTN, suspected CKD, chronic anemia admitted with bilateral leg pain suspected due to disseminated gout vs other inflammatory disorder (LE duplex negative). Also found to have AKI, UTI, possible mild diastolic CHF, and new diagnosis of coarse AF vs flutter.  1. Paroxysmal coarse atrial fibrillation/flutter with component of tachy-brady syndrome - suspected precipitated by  infection/fever - tele reveals NSR rates 60s, also AF in the 100s-140s with transient nocturnal bradycardia in the 30s when sleeping - will discuss whether we need to consider AAD vs observe further with MD - not on any AVN blocking agent - MD lowered apixaban dose on 07/24/14 due to age/renal dysfunction - Cr is now 1.23 qualifying her for 5mg  BID dose - ? increase back up to 5 vs recheck as outpatient and increase if stable 2. Possible component of diastolic CHF, improved - 2D echo EF 55-60%, mild LVH, grade 1 d/d, mild AI, mild LA dilatation 3. Medical issues of suspected gout/UTI/fever, on abx/prednisone 4. CKD stage III 5. Abnormal CBC with chronic anemia, elevated WBC/platelets  Per nursing, the patient is likely being discharged today. Sent message to [employer], to arrange f/u with Dr. Martinique or APP in 10 days as well as establish care in our blood thinner clinic for periodic monitoring while on NOAC.  Signed, Melina Copa PA-C

## 2014-07-27 NOTE — Progress Notes (Signed)
Chaplain visited with Courtney Bradford. She was trying to get some rest. Will return tomorrow.   Page if needed/desired.  Delford Field 07/27/2014 4:13 PM [id number]

## 2014-07-27 NOTE — Progress Notes (Signed)
ANTICOAGULATION CONSULT NOTE -Follow up Pharmacy Consult : APIXABAN Indication: atrial fibrillation  Allergies  Allergen Reactions  . Lactose Intolerance (Gi)     Upset stomach    Patient Measurements: Height: 5\' 3"  (160 cm) Weight: 155 lb 13.8 oz (70.7 kg) IBW/kg (Calculated) : 52.4 Heparin Dosing Weight: 67kg   Vital Signs: Temp: 98.3 F (36.8 C) (09/28 1515) Temp src: Oral (09/28 1515) BP: 140/62 mmHg (09/28 1515) Pulse Rate: 86 (09/28 1515)  Labs:  Recent Labs  07/25/14 0840 07/26/14 0840 07/27/14 0407  HGB 9.5* 9.3*  --   HCT 28.4* 28.4*  --   PLT 405* 499*  --   CREATININE 1.32*  --  1.23*    Estimated Creatinine Clearance: 34.4 ml/min (by C-G formula based on Cr of 1.23).  Assessment: 80 yo female with Aflutter transitioned to oral agent, Apixaban for nonvalvular atrial fibrillation on 07/23/14. Weight 70.7 kg, age 80 y.o,  SCr = improved to 1.23 <1.32 <1.55, CrCl ~ 34.4.  Apixaban dose was decreased to 2.5 mg po BID per Dr. Doug Sou request on 07/24/14.  Hg 9.3, pltc 499, stable. No bleeding reported.  Today cardiologist, Dr. Radford Pax added PO amiodarone due to patient continues to have episodes of afib with RVR and she increased Apixaban to 5mg  BID since her creatinine is <1.5 and weight is >60kg.    Goal of Therapy:  Monitor platelets by anticoagulation protocol: Yes   Plan:   Apixaban (Eliquis) increased by cardiologist to 5 mg po BID.   Nicole Cella, RPh Clinical Pharmacist Pager: [phone] 07/27/2014 4:17 PM

## 2014-07-27 NOTE — Progress Notes (Signed)
Pt had a change of rhythm was A-flutter and now changed to NSR with marked sinus arrhythmia, also pt had HR drop to 35, but nonsustained, pt has had incidents of HR dropping to 34 during this admission, which MD is aware, notified MD, will continue to monitor, Thanks Arvella Nigh RN

## 2014-07-27 NOTE — Progress Notes (Addendum)
Pharmacy Consult for Dofetilide (Tikosyn) Initiation  Admit Complaint: 80 y.o. female admitted 07/21/2014 with atrial fibrillation to be initiated on dofetilide.   Assessment:   Patient Exclusion Criteria: If any screening criteria checked as "Yes", then  patient  should NOT receive dofetilide until criteria item is corrected. If "Yes" please indicate correction plan.  YES  NO Patient  Exclusion Criteria Correction Plan  []  [x]  Baseline QTc interval is greater than or equal to 440 msec. IF above YES box checked dofetilide contraindicated unless patient has ICD; then may proceed if QTc 500-550 msec or with known ventricular conduction abnormalities may proceed with QTc 550-600 msec. QTc =  401   []  [x]  Magnesium level is less than 1.8 mEq/l : Last magnesium:  Lab Results  Component Value Date   MG 1.8 07/21/2014       Rechecking prior to first dose since last value is 47 days old  []  [x]  Potassium level is less than 4 mEq/l : Last potassium:  Lab Results  Component Value Date   K 4.3 07/27/2014         []  [x]  Patient is known or suspected to have a digoxin level greater than 2 ng/ml: No results found for this basename: DIGOXIN      []  [x]  Creatinine clearance less than 20 ml/min (calculated using Cockcroft-Gault, actual body weight and serum creatinine): Estimated Creatinine Clearance: 34.4 ml/min (by C-G formula based on Cr of 1.23).    []  [x]  Patient has received drugs known to prolong the QT intervals within the last 48 hours(phenothiazines, tricyclics or tetracyclic antidepressants, erythromycin, H-1 antihistamines, cisapride, fluoroquinolones, azithromycin). Drugs not listed above may have an, as yet, undetected potential to prolong the QT interval, updated information on QT prolonging agents is available at this website:QT prolonging agents   []  [x]  Patient received a dose of hydrochlorothiazide (Oretic) alone or in any combination including triamterene (Dyazide, Maxzide) in the  last 48 hours.   []  [x]  Patient received a medication known to increase dofetilide plasma concentrations prior to initial dofetilide dose:    Trimethoprim (Primsol, Proloprim) in the last 36 hours   Verapamil (Calan, Verelan) in the last 36 hours or a sustained release dose in the last 72 hours   Megestrol (Megace) in the last 5 days    Cimetidine (Tagamet) in the last 6 hours   Ketoconazole (Nizoral) in the last 24 hours   Itraconazole (Sporanox) in the last 48 hours    Prochlorperazine (Compazine) in the last 36 hours    []  [x]  Patient is known to have a history of torsades de pointes; congenital or acquired long QT syndromes.   []  [x]  Patient has received a Class 1 antiarrhythmic with less than 2 half-lives since last dose. (Disopyramide, Quinidine, Procainamide, Lidocaine, Mexiletine, Flecainide, Propafenone)   [x]  []  Patient has received amiodarone therapy in the past 3 months or amiodarone level is greater than 0.3 ng/ml. 1 dose today-MD aware   Patient has been appropriately anticoagulated with apixaban.  Ordering provider was confirmed at [email or web address] if they are not listed on the [facility] list.  Goal of Therapy:  Follow renal function, electrolytes, potential drug interactions, and dose adjustment. Provide education and 1 week supply at discharge.  Plan:  1.  Initiate dofetilide based on renal function: [facility] Calculated CrCl  Dose q12h  []  > 60 ml/min 500 mcg  []  40-60 ml/min 250 mcg  [x]  20-40 ml/min 125 mcg   2. Follow up QTc after  the first 5 doses, renal function, electrolytes (K & Mg) daily x 3 days, dose adjustment, success of initiation and facilitate 1 week discharge supply as clinically indicated.  3. Initiate Tikosyn education video (Call [phone] and ask for video # [phone]).  4. Place Enrollment Form on the chart for discharge supply of dofetilide.   Courtney Bradford 6:17 PM 07/27/2014   ADDENDUM mag rechecked and resulted at 1.9. K  4.7. Ok to give dose tonight as ordered by Dr. Radford Pax.  Courtney Bradford, PharmD, BCPS Clinical Pharmacist 07/27/2014 10:55 PM

## 2014-07-28 DIAGNOSIS — M109 Gout, unspecified: Secondary | ICD-10-CM | POA: Diagnosis not present

## 2014-07-28 DIAGNOSIS — I10 Essential (primary) hypertension: Secondary | ICD-10-CM

## 2014-07-28 LAB — BASIC METABOLIC PANEL
ANION GAP: 11 (ref 5–15)
BUN: 52 mg/dL — AB (ref 6–23)
CO2: 23 mEq/L (ref 19–32)
CREATININE: 1.2 mg/dL — AB (ref 0.50–1.10)
Calcium: 9.5 mg/dL (ref 8.4–10.5)
Chloride: 107 mEq/L (ref 96–112)
GFR calc Af Amer: 48 mL/min — ABNORMAL LOW (ref >90)
GFR, EST NON AFRICAN AMERICAN: 42 mL/min — AB (ref >90)
Glucose, Bld: 125 mg/dL — ABNORMAL HIGH (ref 70–99)
POTASSIUM: 4.7 meq/L (ref 3.7–5.3)
Sodium: 141 mEq/L (ref 137–147)

## 2014-07-28 LAB — GLUCOSE, CAPILLARY
GLUCOSE-CAPILLARY: 302 mg/dL — AB (ref 70–99)
GLUCOSE-CAPILLARY: 256 mg/dL — AB (ref 70–99)
Glucose-Capillary: 93 mg/dL (ref 70–99)
Glucose-Capillary: 128 mg/dL — ABNORMAL HIGH (ref 70–99)

## 2014-07-28 LAB — MAGNESIUM: MAGNESIUM: 1.8 mg/dL (ref 1.5–2.5)

## 2014-07-28 LAB — CULTURE, BLOOD (ROUTINE X 2)
CULTURE: NO GROWTH
CULTURE: NO GROWTH

## 2014-07-28 MED ORDER — MAGNESIUM SULFATE IN D5W 10-5 MG/ML-% IV SOLN
1.0000 g | Freq: Once | INTRAVENOUS | Status: AC
  Administered 2014-07-28: 1 g via INTRAVENOUS
  Filled 2014-07-28: qty 100

## 2014-07-28 NOTE — Progress Notes (Signed)
Subjective: No complaints, not aware of heart racing, no chest pain, no SOB   Objective: Vital signs in last 24 hours: Temp:  [98.2 F (36.8 C)-98.6 F (37 C)] 98.2 F (36.8 C) (09/29 0616) Pulse Rate:  [72-86] 72 (09/29 0616) Resp:  [20] 20 (09/29 0616) BP: (124-146)/(56-62) 138/62 mmHg (09/29 0616) SpO2:  [100 %] 100 % (09/29 0616) Weight:  [154 lb 0.6 oz (69.87 kg)] 154 lb 0.6 oz (69.87 kg) (09/29 0616) Weight change: -1 lb 13.3 oz (-0.83 kg) Last BM Date: 07/27/14 Intake/Output from previous day: +358  (+2159 since admit) wt 154 down from pk of 162 09/28 0701 - 09/29 0700 In: 360 [P.O.:360] Out: 2 [Urine:2] Intake/Output this shift: Total I/O In: 240 [P.O.:240] Out: -   PE: General:Pleasant affect, NAD Skin:Warm and dry, brisk capillary refill HEENT:normocephalic, sclera clear, mucus membranes moist Heart:S1S2 RRR without murmur, gallup, rub or click Lungs:clear without rales, rhonchi, or wheezes [id number], non tender, + BS, do not palpate liver spleen or masses Ext:no lower ext edema, 2+ pedal pulses, 2+ radial pulses Neuro:alert and oriented, MAE, follows commands, + facial symmetry  TELE:  SR in the 60s but still with runs of a fib with rates up to 140.   Lab Results:  Recent Labs  07/26/14 0840  WBC 12.2*  HGB 9.3*  HCT 28.4*  PLT 499*   BMET  Recent Labs  07/27/14 2117 07/28/14 0435  NA 140 141  K 4.7 4.7  CL 104 107  CO2 23 23  GLUCOSE 154* 125*  BUN 56* 52*  CREATININE 1.33* 1.20*  CALCIUM 9.8 9.5   No results found for this basename: TROPONINI, CK, MB,  in the last 72 hours  No results found for this basename: CHOL, HDL, LDLCALC, LDLDIRECT, TRIG, CHOLHDL   Lab Results  Component Value Date   HGBA1C 5.3 07/23/2014     Lab Results  Component Value Date   TSH 1.140 07/22/2014      Studies/Results: No results found.  Medications: I have reviewed the patient's current medications. Scheduled Meds: . aliskiren  150 mg  Oral Daily  . amLODipine  5 mg Oral Daily  . apixaban  5 mg Oral BID  . cefTRIAXone (ROCEPHIN)  IV  1 g Intravenous Q24H  . cloNIDine  0.3 mg Transdermal Weekly  . dofetilide  125 mcg Oral BID  . ferrous fumarate  1 tablet Oral BID  . Influenza vac split quadrivalent PF  0.5 mL Intramuscular Tomorrow-1000  . insulin aspart  0-15 Units Subcutaneous TID WC  . MUSCLE RUB   Topical QID  . pantoprazole  40 mg Oral Q0600  . predniSONE  30 mg Oral Q breakfast  . sodium chloride  3 mL Intravenous Q12H  . sodium chloride  3 mL Intravenous Q12H  . sodium chloride  3 mL Intravenous Q12H   Continuous Infusions:  PRN Meds:.sodium chloride, acetaminophen, acetaminophen, morphine injection, ondansetron (ZOFRAN) IV, ondansetron, sodium chloride, traMADol  Assessment/Plan: 80 y/o F with history of DM, HTN, suspected CKD, chronic anemia admitted with bilateral leg pain suspected due to disseminated gout vs other inflammatory disorder (LE duplex negative). Also found to have AKI, UTI, possible mild diastolic CHF, and new diagnosis of coarse AF vs flutter.   1. Paroxysmal coarse atrial fibrillation/flutter with component of tachy-brady syndrome  - suspected precipitated by infection/fever  -  tele reveals NSR rates 60s, also AF in the 140s no transient nocturnal bradycardia in the  30s when sleeping last pm but did occur earlier.  - Dr. Radford Pax discussed with EP and given significant bradycardia at night will stop Amio and start Tikosyn 165mcg BID. Will check Mg level before 1st dose. She has only gotten one dose of Amio. QTC ok in NSR this am. Her CrCl is 40 so will place on 121mcg BID and follow QTc- 390 ms today and renal function closely Mg+ 1.8  - MD lowered apixaban dose on 07/24/14 due to age/renal dysfunction - Cr is now 1.23 qualifying her for 5mg  BID dose - increased back up to 5 mg    2. Possible component of diastolic CHF, improved  - 2D echo EF 55-60%, mild LVH, grade 1 d/d, mild AI, mild LA  dilatation   3. Medical issues of suspected gout/UTI/fever, on abx/prednisone   4. CKD stage III   5. Abnormal CBC with chronic anemia, elevated WBC/platelets      LOS: 7 days   Time spent with pt. :15 minutes. [facility]  Nurse Practitioner Certified Pager [phone] or after 5pm and on weekends call [phone] 07/28/2014, 10:24 AM

## 2014-07-28 NOTE — Progress Notes (Signed)
[facility] Progress Note   Courtney Bradford MRN:[id number] DOB: 04/05/1931 DOA: 07/21/2014 PCP: HILL,GERALD K, MD  Brief narrative: Courtney Bradford is a 80 y.o. female with DM, HTN, anemia admitted with b/l leg pain starting about 4 days prior to admission. She states it started in her right leg and then went to her left leg. In the ER she was found to have a-flutter and possible CHF and given Lasix. No h/o A-flutter in the past.   She continues to have breakthrough flutter/ fib episodes and is being managed by the cardiology service.    Subjective: No complaints - doing well  Assessment/Plan:  B/l leg pain -  suspecting disseminated gout or other inflammatory disorder- ANA pending -  d/c'd colchicine as Creatine was rising  - ESR significantly elevated- started Solumedrol resulting in significant improvement in pain- transitioned to Prednisone taper - per son, Oxycodone is making her too sleepy-  d/c'd in favor of Tramadol  - admitting doctor suspected cellulitis and started Vancomycin- no signs of cellulitis on my exam therefore d/c'd on 9/23  - venous duplex ordered negative  - X ray of the left ankle normal    ARF -due to Lasix, poor PO intake and Colchicine- d/c'd  - renal function improved     Atrial flutter- new diagnosis - TSH FT4 normal- free T3 slightly low- follow -started on Apixiban - managed by cardiology Echocardiogram reveals LVEF of 55% and grade 1 diastolic dysfunction.   UTI- fevers -culture negative x 2- patient asymptomatic . Currently on rocephin started 9/23- will give 7 day course for complicated UTI - As per the patient and son at bedside, she has a h/o cystocele and rectocele and underwent transvaginal tape procedure in 2012, but she has recurrence of the prolapse with subsequent multiple urinary tract infections. S    Diabetes mellitus type 2, controlled - cont sliding scale - follow on steroids     HYPERTENSION, BENIGN - Clonidine,  aliskiren and Amlodipine  Mild diastolic heart failure: Appears to be compensated.   CKD 3 - follow  Iron deficiency anemia? - on Iron supplements at home.  Anemia panel ordered and consistent with AOCD  - severely elevated ferretin suggestive of underlying inflammation.  Hypokalemia: replete as needed.    Code Status: Full code Family Communication: son at bedside.  Disposition Plan: home-  DVT prophylaxis: Eliquis  Consultants: cardiology  Procedures: None  Antibiotics: Anti-infectives   Start     Dose/Rate Route Frequency Ordered Stop   07/22/14 1830  cefTRIAXone (ROCEPHIN) 1 g in dextrose 5 % 50 mL IVPB     1 g 100 mL/hr over 30 Minutes Intravenous Every 24 hours 07/22/14 1757     07/22/14 0500  vancomycin (VANCOCIN) IVPB 750 mg/150 ml premix  Status:  Discontinued     750 mg 150 mL/hr over 60 Minutes Intravenous Every 24 hours 07/22/14 0428 07/23/14 1028         Objective: Filed Weights   07/26/14 0524 07/27/14 0546 07/28/14 0616  Weight: 71 kg (156 lb 8.4 oz) 70.7 kg (155 lb 13.8 oz) 69.87 kg (154 lb 0.6 oz)    Intake/Output Summary (Last 24 hours) at 07/28/14 1213 Last data filed at 07/28/14 1155  Gross per 24 hour  Intake    840 ml  Output      3 ml  Net    837 ml     Vitals Filed Vitals:   07/27/14 1630 07/27/14 2117 07/28/14 0616 07/28/14 1104    BP: 124/56 146/60 138/62 146/56  Pulse: 76 78 72 76  Temp:  98.6 F (37 C) 98.2 F (36.8 C) 98.1 F (36.7 C)  TempSrc:  Oral Oral Oral  Resp:  20 20 18  Height:      Weight:   69.87 kg (154 lb 0.6 oz)   SpO2:  100% 100%     Exam: General: No acute respiratory distress Lungs: Clear to auscultation bilaterally without wheezes or crackles Cardiovascular: Regular rate and rhythm without murmur gallop or rub normal S1 and S2 Abdomen: Nontender, nondistended, soft, bowel sounds positive, no rebound, no ascites, no appreciable mass Extremities: No significant cyanosis, clubbing or edema in  LE  Data Reviewed: Basic Metabolic Panel:  Recent Labs Lab 07/21/14 2321  07/24/14 0421 07/25/14 0840 07/27/14 0407 07/27/14 2117 07/28/14 0435  NA 143  < > 143 137 141 140 141  K 3.6*  < > 4.4 4.2 4.3 4.7 4.7  CL 104  < > 106 101 106 104 107  CO2 22  < > 23 20 24 23 23  GLUCOSE 118*  < > 148* 243* 130* 154* 125*  BUN 37*  < > 47* 56* 59* 56* 52*  CREATININE 1.12*  < > 1.55* 1.32* 1.23* 1.33* 1.20*  CALCIUM 9.1  < > 9.4 10.0 9.5 9.8 9.5  MG 1.8  --   --   --   --  1.9 1.8  < > = values in this interval not displayed. Liver Function Tests:  Recent Labs Lab 07/21/14 2321  AST 16  ALT 9  ALKPHOS 64  BILITOT 1.0  PROT 7.2  ALBUMIN 3.1*    Recent Labs Lab 07/21/14 2321  LIPASE 20   No results found for this basename: AMMONIA,  in the last 168 hours CBC:  Recent Labs Lab 07/21/14 2321 07/22/14 0544 07/23/14 0542 07/24/14 0421 07/25/14 0840 07/26/14 0840  WBC 11.3* 10.0 12.8* 12.5* 15.3* 12.2*  NEUTROABS 9.1*  --   --   --   --  11.0*  HGB 10.1* 9.3* 9.2* 9.0* 9.5* 9.3*  HCT 30.7* 28.4* 27.7* 27.3* 28.4* 28.4*  MCV 87.0 89.6 85.8 86.1 85.8 88.5  PLT 270 259 306 337 405* 499*   Cardiac Enzymes:  Recent Labs Lab 07/21/14 2321  CKTOTAL 72  TROPONINI <0.30   BNP (last 3 results)  Recent Labs  07/21/14 2321  PROBNP 989.3*   CBG:  Recent Labs Lab 07/27/14 1104 07/27/14 1627 07/27/14 2127 07/28/14 0609 07/28/14 1157  GLUCAP 258* 198* 152* 93 302*    Recent Results (from the past 240 hour(s))  CULTURE, BLOOD (ROUTINE X 2)     Status: None   Collection Time    07/22/14  5:50 AM      Result Value Ref Range Status   Specimen Description BLOOD LEFT HAND   Final   Special Requests BOTTLES DRAWN AEROBIC ONLY 6CC   Final   Culture  Setup Time     Final   Value: 07/22/2014 08:38     Performed at [facility]   Culture     Final   Value: NO GROWTH 5 DAYS     Performed at [facility]   Report Status 07/28/2014 FINAL   Final   CULTURE, BLOOD (ROUTINE X 2)     Status: None   Collection Time    07/22/14  5:55 AM      Result Value Ref Range Status   Specimen Description BLOOD RIGHT HAND     Final   Special Requests BOTTLES DRAWN AEROBIC ONLY 7CC   Final   Culture  Setup Time     Final   Value: 07/22/2014 08:37     Performed at [facility]   Culture     Final   Value: NO GROWTH 5 DAYS     Performed at [facility]   Report Status 07/28/2014 FINAL   Final  URINE CULTURE     Status: None   Collection Time    07/24/14  5:32 AM      Result Value Ref Range Status   Specimen Description URINE, CATHETERIZED   Final   Special Requests NONE   Final   Culture  Setup Time     Final   Value: 07/24/2014 09:28     Performed at [facility]   Colony Count     Final   Value: NO GROWTH     Performed at [facility]   Culture     Final   Value: NO GROWTH     Performed at [facility]   Report Status 07/25/2014 FINAL   Final     Studies:  Recent x-ray studies have been reviewed in detail by the Attending Physician  Scheduled Meds:  Scheduled Meds: . aliskiren  150 mg Oral Daily  . amLODipine  5 mg Oral Daily  . apixaban  5 mg Oral BID  . cefTRIAXone (ROCEPHIN)  IV  1 g Intravenous Q24H  . cloNIDine  0.3 mg Transdermal Weekly  . dofetilide  125 mcg Oral BID  . ferrous fumarate  1 tablet Oral BID  . insulin aspart  0-15 Units Subcutaneous TID WC  . magnesium sulfate 1 - 4 g bolus IVPB  1 g Intravenous Once  . MUSCLE RUB   Topical QID  . pantoprazole  40 mg Oral Q0600  . predniSONE  30 mg Oral Q breakfast  . sodium chloride  3 mL Intravenous Q12H  . sodium chloride  3 mL Intravenous Q12H  . sodium chloride  3 mL Intravenous Q12H   Continuous Infusions:    Time spent on care of this patient: 30 min   ,, MD 07/28/2014, 12:13 PM  LOS: 7 days   [facility] Office  [phone] Pager - Text Page per [email or web address]  If 7PM-7AM, please contact  night-coverage [email or web address]        

## 2014-07-28 NOTE — Progress Notes (Signed)
Patient seen and examined and agree with note as outlined by Cecilie Kicks, NP.  Still having quite a bit of breakthrough afib with RVR with HR up in the 140's.  Cannot use BB/CCB or Amio due to significant brady episodes.  Therefore, after speaking with EP, will opt for low dose Tikosyn.  Her CrCl is 41 and QTc is ok so will use 162mcg BID and if is does not suppress afib then consider increasing dose and following renal function closely.  Replete Mg this am.

## 2014-07-28 NOTE — Progress Notes (Signed)
Patient received first dose of Tikosyn last night. Patient converted to NSR and was confirmed via EKG this morning. Tresa Endo

## 2014-07-29 ENCOUNTER — Telehealth: Payer: Self-pay | Admitting: Cardiology

## 2014-07-29 LAB — GLUCOSE, CAPILLARY
GLUCOSE-CAPILLARY: 113 mg/dL — AB (ref 70–99)
GLUCOSE-CAPILLARY: 123 mg/dL — AB (ref 70–99)
Glucose-Capillary: 254 mg/dL — ABNORMAL HIGH (ref 70–99)
Glucose-Capillary: 215 mg/dL — ABNORMAL HIGH (ref 70–99)

## 2014-07-29 LAB — BASIC METABOLIC PANEL
Anion gap: 9 (ref 5–15)
BUN: 46 mg/dL — AB (ref 6–23)
CALCIUM: 9.8 mg/dL (ref 8.4–10.5)
CO2: 28 mEq/L (ref 19–32)
Chloride: 105 mEq/L (ref 96–112)
Creatinine, Ser: 1.17 mg/dL — ABNORMAL HIGH (ref 0.50–1.10)
GFR calc non Af Amer: 43 mL/min — ABNORMAL LOW (ref >90)
GFR, EST AFRICAN AMERICAN: 50 mL/min — AB (ref >90)
GLUCOSE: 99 mg/dL (ref 70–99)
POTASSIUM: 4.6 meq/L (ref 3.7–5.3)
SODIUM: 142 meq/L (ref 137–147)

## 2014-07-29 LAB — MAGNESIUM: Magnesium: 1.8 mg/dL (ref 1.5–2.5)

## 2014-07-29 MED ORDER — HYDRALAZINE HCL 20 MG/ML IJ SOLN
5.0000 mg | Freq: Once | INTRAMUSCULAR | Status: AC
  Administered 2014-07-29: 5 mg via INTRAVENOUS
  Filled 2014-07-29: qty 1

## 2014-07-29 NOTE — Progress Notes (Signed)
Patient Name: Courtney Bradford Date of Encounter: 07/29/2014     Principal Problem:   Leg pain, bilateral Active Problems:   HYPERTENSION   SUI (stress urinary incontinence, female)   Acute diastolic congestive heart failure   Atrial fibrillation-new 07/22/13- CVR- unknown duration   Diabetes mellitus type 2, controlled   UTI (urinary tract infection)    SUBJECTIVE  Denies any CP or SOB. No dizziness last night while ambulating in the room  CURRENT MEDS . aliskiren  150 mg Oral Daily  . amLODipine  5 mg Oral Daily  . apixaban  5 mg Oral BID  . cefTRIAXone (ROCEPHIN)  IV  1 g Intravenous Q24H  . cloNIDine  0.3 mg Transdermal Weekly  . dofetilide  125 mcg Oral BID  . ferrous fumarate  1 tablet Oral BID  . insulin aspart  0-15 Units Subcutaneous TID WC  . MUSCLE RUB   Topical QID  . pantoprazole  40 mg Oral Q0600  . predniSONE  30 mg Oral Q breakfast  . sodium chloride  3 mL Intravenous Q12H    OBJECTIVE  Filed Vitals:   07/28/14 2353 07/29/14 0438 07/29/14 0613 07/29/14 1042  BP: 187/56 180/67 178/69 162/58  Pulse: 65 65 68 96  Temp: 98.2 F (36.8 C) 98.1 F (36.7 C) 98.3 F (36.8 C)   TempSrc: Oral Oral Oral   Resp: 18 18 20    Height:      Weight:   152 lb 5.4 oz (69.1 kg)   SpO2: 100% 100% 100%     Intake/Output Summary (Last 24 hours) at 07/29/14 1103 Last data filed at 07/29/14 0915  Gross per 24 hour  Intake    960 ml  Output     26 ml  Net    934 ml   Filed Weights   07/27/14 0546 07/28/14 0616 07/29/14 0613  Weight: 155 lb 13.8 oz (70.7 kg) 154 lb 0.6 oz (69.87 kg) 152 lb 5.4 oz (69.1 kg)    PHYSICAL EXAM  General: Pleasant, NAD. Neuro: Alert and oriented X 3. Moves all extremities spontaneously. Psych: Normal affect. HEENT:  Normal  Neck: Supple without bruits. +JVD Lungs:  Resp regular and unlabored, CTA, mildly decrease breath sound, no obvious rale Heart: irregular no s3, s4, or murmurs. Abdomen: Soft, non-tender, non-distended, BS + x  4.  Extremities: No clubbing, cyanosis or edema. DP/PT/Radials 2+ and equal bilaterally.  Accessory Clinical Findings  CBC No results found for this basename: WBC, NEUTROABS, HGB, HCT, MCV, PLT,  in the last 72 hours Basic Metabolic Panel  Recent Labs  07/28/14 0435 07/29/14 0459  NA 141 142  K 4.7 4.6  CL 107 105  CO2 23 28  GLUCOSE 125* 99  BUN 52* 46*  CREATININE 1.20* 1.17*  CALCIUM 9.5 9.8  MG 1.8 1.8    TELE A-fib with HR 60-80s, alternating fast and slow HR. Slowest HR 30s. Occasional sinus brady vs wenkebach brady    ECG  NSR with HR 60s, QTc 409  Echocardiogram 07/22/2014  LV EF: 55% - 60%  ------------------------------------------------------------------- Indications: Cardiomegaly (429.3).  ------------------------------------------------------------------- History: PMH: Dyspnea. Atrial flutter. Congestive heart failure. Risk factors: Hypertension. Diabetes mellitus.  ------------------------------------------------------------------- Study Conclusions  - Left ventricle: Abnormal septal motion. The cavity size was normal. Wall thickness was increased in a pattern of mild LVH. Systolic function was normal. The estimated ejection fraction was in the range of 55% to 60%. Doppler parameters are consistent with abnormal left ventricular relaxation (grade 1  diastolic dysfunction). - Aortic valve: There was mild regurgitation. - Left atrium: The atrium was mildly dilated. - Atrial septum: No defect or patent foramen ovale was identified.      Radiology/Studies  Dg Chest 2 View  07/22/2014   CLINICAL DATA:  LEG PAIN  EXAM: CHEST  2 VIEW  COMPARISON:  None.  FINDINGS: Mild cardiomegaly is present. Mediastinal silhouettes within normal limits.  The lungs are normally inflated. No airspace consolidation, pleural effusion, or pulmonary edema is identified. There is no pneumothorax.  No acute osseous abnormality identified.  IMPRESSION: 1. No acute  cardiopulmonary abnormality. 2. Mild cardiomegaly.   Electronically Signed   By: Jeannine Boga M.D.   On: 07/22/2014 00:38   Dg Ankle Complete Left  07/23/2014   CLINICAL DATA:  Left ankle swelling  EXAM: LEFT ANKLE COMPLETE - 3+ VIEW  COMPARISON:  None.  FINDINGS: Three views of left ankle submitted. No acute fracture or subluxation. There is diffuse osteopenia. Ankle mortise is preserved. Small posterior and plantar spur of calcaneus.  IMPRESSION: No acute fracture or subluxation.  Diffuse osteopenia.   Electronically Signed   By: Lahoma Crocker M.D.   On: 07/23/2014 11:17    ASSESSMENT AND PLAN  80 y/o F with history of DM, HTN, suspected CKD, chronic anemia admitted with bilateral leg pain suspected due to disseminated gout vs other inflammatory disorder (LE duplex negative). Also found to have AKI, UTI, possible mild diastolic CHF, and new diagnosis of coarse AF vs flutter. Unable to start rate control med or amiodarone due to significant bradycardia  1. Paroxysmal coarse atrial fibrillation/flutter with component of tachy-brady syndrome   - suspected precipitated by infection/fever   - after discussing with EP, amio stopped, and Tikosyn 133mcg BID started. QTc stable since initiation  - apixaban dosage lowered due to renal function  - went back into a-fib last night and has been persistent in a-fib since then with only occasional NSR, continue to have intermittent sinus brady with occasional wenckebach brady as well. Asymptomatic Likely has some conduction abnormality. Will check with Dr. Radford Pax, however may need pacemaker to allow further treatment of a-fib, L atrium only mildly dilated on recent echo.   2. Possible component of diastolic CHF, improved   - 2D echo EF 55-60%, mild LVH, grade 1 d/d, mild AI, mild LA dilatation  3. Medical issues of suspected gout/UTI/fever, on abx/prednisone  4. CKD stage III  5. Abnormal CBC with chronic anemia, elevated WBC/platelets  6. HTN: need  better control, consider increase amlodipine from 5mg  to 10mg      Signed, Woodward Ku Pager: [phone]

## 2014-07-29 NOTE — Progress Notes (Addendum)
Inpatient Diabetes Program Recommendations  AACE/ADA: New Consensus Statement on Inpatient Glycemic Control (2013)  Target Ranges:  Prepandial:   less than 140 mg/dL      Peak postprandial:   less than 180 mg/dL (1-2 hours)      Critically ill patients:  140 - 180 mg/dL   Reason for Assessment: Hyperglycemia  Diabetes history: Type 2 Outpatient Diabetes medications: Regular insulin 10 units every morning Current orders for Inpatient glycemic control: Moderate Novolog correction tid  Results for ACADIA, PAHLS (MRN [id number]) as of 07/29/2014 14:05  Ref. Range 07/28/2014 06:09 07/28/2014 11:57 07/28/2014 15:51 07/28/2014 20:46 07/29/2014 06:15 07/29/2014 11:40  Glucose-Capillary Latest Range: 70-99 mg/dL 93 302 (H) 256 (H) 128 (H) 113 (H) 254 (H)   Note:  Patient's CBG pattern suggests a need for meal coverage to provide adequate glycemic control.  Based on her weight and steroid therapy, recommend Novolog 6 units tid with meals as meal coverage in addition to correction tid with meals-- provided she eat at least 50% of meal and CBG is at least 80 mg/dl.    Thank you.  Kaenan Jake S. Marcelline Mates, RN, CNS, CDE Inpatient Diabetes Program, team pager [phone]   Addendum:  Patient is 80 years of age.  May want to begin Novolog at 3 to 4 units tid with meals and titrate upward as indicated.    Thank you.  Jeanpierre Thebeau S. Marcelline Mates, RN, CNS, CDE Inpatient Diabetes Program, team pager [phone]

## 2014-07-29 NOTE — Progress Notes (Signed)
Patient seen and examined and agee with note as outlined by Almyra Deforest, PA-C.  Still having breakthrough afib on  Tikosyn.  Her Tikosyn has been dosed on lower end at 16mcg BID due to CrCl at 40.  She has intermittent episodes of bradycardia.  Cannot use BB or CCB for suppression of afib due to recent profound bradycardia on these agents recently.  She clearly has tachy brady syndrome and most likely needs pacing so that we can use additional agents to prevent afib and also control HR if she goes into afib.  I am learly about increasing her Tikosyn dose since her renal function is borderline. QTc is stable.  Will get EP consult.

## 2014-07-29 NOTE — Telephone Encounter (Signed)
Closed encounter °

## 2014-07-29 NOTE — Progress Notes (Signed)
[facility] Progress Note   HILARIA TITSWORTH JJK:[id number] DOB: Jan 06, 1931 DOA: 07/21/2014 PCP: Maggie Font, MD  Brief narrative: Courtney Bradford is a 80 y.o. female with DM, HTN, anemia admitted with b/l leg pain starting about 4 days prior to admission. She states it started in her right leg and then went to her left leg. In the ER she was found to have a-flutter and possible CHF and given Lasix. No h/o A-flutter in the past.   She continues to have breakthrough flutter/ fib episodes and is being managed by the cardiology service.    Subjective: No complaints - doing well  Assessment/Plan:  B/l leg pain -  suspecting disseminated gout or other inflammatory disorder- ANA pending -  d/c'd colchicine as Creatine was rising  - ESR significantly elevated- started Solumedrol resulting in significant improvement in pain- transitioned to Prednisone taper - per son, Oxycodone is making her too sleepy-  d/c'd in favor of Tramadol  - admitting doctor suspected cellulitis and started Vancomycin- no signs of cellulitis on my exam therefore d/c'd on 9/23  - venous duplex ordered negative  - X ray of the left ankle normal    ARF -due to Lasix, poor PO intake and Colchicine- d/c'd  - renal function improved     Atrial flutter- new diagnosis - TSH FT4 normal- free T3 slightly low- follow -started on Apixiban - managed by cardiology Echocardiogram reveals LVEF of 55% and grade 1 diastolic dysfunction.   UTI- fevers -culture negative x 2- patient asymptomatic . Currently on rocephin started 9/23- will give 7 day course for complicated UTI - As per the patient and son at bedside, she has a h/o cystocele and rectocele and underwent transvaginal tape procedure in 2012, but she has recurrence of the prolapse with subsequent multiple urinary tract infections. S    Diabetes mellitus type 2, controlled - cont sliding scale - follow on steroids  CBG (last 3)   Recent Labs  07/28/14 2046  07/29/14 0615 07/29/14 1140  GLUCAP 128* 113* 254*        HYPERTENSION, BENIGN - Clonidine, aliskiren and Amlodipine  Mild diastolic heart failure: Appears to be compensated.   CKD 3 - follow  Iron deficiency anemia? - on Iron supplements at home.  Anemia panel ordered and consistent with AOCD  - severely elevated ferretin suggestive of underlying inflammation.  Hypokalemia: replete as needed.    Code Status: Full code Family Communication: son at bedside.  Disposition Plan: home-  DVT prophylaxis: Eliquis  Consultants: cardiology  Procedures: None  Antibiotics: Anti-infectives   Start     Dose/Rate Route Frequency Ordered Stop   07/22/14 1830  cefTRIAXone (ROCEPHIN) 1 g in dextrose 5 % 50 mL IVPB     1 g 100 mL/hr over 30 Minutes Intravenous Every 24 hours 07/22/14 1757     07/22/14 0500  vancomycin (VANCOCIN) IVPB 750 mg/150 ml premix  Status:  Discontinued     750 mg 150 mL/hr over 60 Minutes Intravenous Every 24 hours 07/22/14 0428 07/23/14 1028         Objective: Filed Weights   07/27/14 0546 07/28/14 0616 07/29/14 0613  Weight: 70.7 kg (155 lb 13.8 oz) 69.87 kg (154 lb 0.6 oz) 69.1 kg (152 lb 5.4 oz)    Intake/Output Summary (Last 24 hours) at 07/29/14 1622 Last data filed at 07/29/14 0915  Gross per 24 hour  Intake    720 ml  Output     25 ml  Net  695 ml     Vitals Filed Vitals:   07/29/14 0438 07/29/14 0613 07/29/14 1042 07/29/14 1356  BP: 180/67 178/69 162/58 151/49  Pulse: 65 68 96 79  Temp: 98.1 F (36.7 C) 98.3 F (36.8 C)  98.3 F (36.8 C)  TempSrc: Oral Oral  Oral  Resp: '18 20  20  ' Height:      Weight:  69.1 kg (152 lb 5.4 oz)    SpO2: 100% 100%  98%    Exam: General: No acute respiratory distress Lungs: Clear to auscultation bilaterally without wheezes or crackles Cardiovascular: Regular rate and rhythm without murmur gallop or rub normal S1 and S2 Abdomen: Nontender, nondistended, soft, bowel sounds positive, no  rebound, no ascites, no appreciable mass Extremities: No significant cyanosis, clubbing or edema in LE  Data Reviewed: Basic Metabolic Panel:  Recent Labs Lab 07/25/14 0840 07/27/14 0407 07/27/14 2117 07/28/14 0435 07/29/14 0459  NA 137 141 140 141 142  K 4.2 4.3 4.7 4.7 4.6  CL 101 106 104 107 105  CO2 '20 24 23 23 28  ' GLUCOSE 243* 130* 154* 125* 99  BUN 56* 59* 56* 52* 46*  CREATININE 1.32* 1.23* 1.33* 1.20* 1.17*  CALCIUM 10.0 9.5 9.8 9.5 9.8  MG  --   --  1.9 1.8 1.8   Liver Function Tests: No results found for this basename: AST, ALT, ALKPHOS, BILITOT, PROT, ALBUMIN,  in the last 168 hours No results found for this basename: LIPASE, AMYLASE,  in the last 168 hours No results found for this basename: AMMONIA,  in the last 168 hours CBC:  Recent Labs Lab 07/23/14 0542 07/24/14 0421 07/25/14 0840 07/26/14 0840  WBC 12.8* 12.5* 15.3* 12.2*  NEUTROABS  --   --   --  11.0*  HGB 9.2* 9.0* 9.5* 9.3*  HCT 27.7* 27.3* 28.4* 28.4*  MCV 85.8 86.1 85.8 88.5  PLT 306 337 405* 499*   Cardiac Enzymes: No results found for this basename: CKTOTAL, CKMB, CKMBINDEX, TROPONINI,  in the last 168 hours BNP (last 3 results)  Recent Labs  07/21/14 2321  PROBNP 989.3*   CBG:  Recent Labs Lab 07/28/14 1157 07/28/14 1551 07/28/14 2046 07/29/14 0615 07/29/14 1140  GLUCAP 302* 256* 128* 113* 254*    Recent Results (from the past 240 hour(s))  CULTURE, BLOOD (ROUTINE X 2)     Status: None   Collection Time    07/22/14  5:50 AM      Result Value Ref Range Status   Specimen Description BLOOD LEFT HAND   Final   Special Requests BOTTLES DRAWN AEROBIC ONLY [facility]   Final   Culture  Setup Time     Final   Value: 07/22/2014 08:38     Performed at [place]     Final   Value: NO GROWTH 5 DAYS     Performed at [facility]   Report Status 07/28/2014 FINAL   Final  CULTURE, BLOOD (ROUTINE X 2)     Status: None   Collection Time    07/22/14  5:55 AM       Result Value Ref Range Status   Specimen Description BLOOD RIGHT HAND   Final   Special Requests BOTTLES DRAWN AEROBIC ONLY [facility]   Final   Culture  Setup Time     Final   Value: 07/22/2014 08:37     Performed at [place]     Final  Value: NO GROWTH 5 DAYS     Performed at [facility]   Report Status 07/28/2014 FINAL   Final  URINE CULTURE     Status: None   Collection Time    07/24/14  5:32 AM      Result Value Ref Range Status   Specimen Description URINE, CATHETERIZED   Final   Special Requests NONE   Final   Culture  Setup Time     Final   Value: 07/24/2014 09:28     Performed at [place]     Final   Value: NO GROWTH     Performed at [facility]   Culture     Final   Value: NO GROWTH     Performed at [facility]   Report Status 07/25/2014 FINAL   Final     Studies:  Recent x-ray studies have been reviewed in detail by the Attending Physician  Scheduled Meds:  Scheduled Meds: . aliskiren  150 mg Oral Daily  . amLODipine  5 mg Oral Daily  . apixaban  5 mg Oral BID  . cefTRIAXone (ROCEPHIN)  IV  1 g Intravenous Q24H  . cloNIDine  0.3 mg Transdermal Weekly  . dofetilide  125 mcg Oral BID  . ferrous fumarate  1 tablet Oral BID  . insulin aspart  0-15 Units Subcutaneous TID WC  . MUSCLE RUB   Topical QID  . pantoprazole  40 mg Oral Q0600  . predniSONE  30 mg Oral Q breakfast  . sodium chloride  3 mL Intravenous Q12H   Continuous Infusions:    Time spent on care of this patient: 25 min   Cass Edinger, MD 07/29/2014, 4:22 PM  LOS: 8 days   [facility] Office  [phone] Pager - Text Page per [email or web address]  If 7PM-7AM, please contact night-coverage [email or web address]

## 2014-07-29 NOTE — Progress Notes (Signed)
PT Cancellation Note  Patient Details Name: LAKIMA TKAC MRN: [id number] DOB: 12/23/1930   Cancelled Treatment:    Reason Eval/Treat Not Completed: Patient declined, wishing to wash-up prior to working with PT. NT present in room to assist. Will check back later today as schedule permits.    Jolyn Lent 07/29/2014, 10:14 AM  Jolyn Lent, PT, DPT Acute Rehabilitation Services Pager: [phone]

## 2014-07-29 NOTE — Progress Notes (Signed)
Patient heart rate dropped into high 30's.  Patient was arouse able and asymptomatic.  Upon awaking patient, heart rate back to 60/70's. [name] text paged and vitals obtained. Will continue to monitor. Blood pressure 187/56, pulse 65, temperature 98.2 F (36.8 C), temperature source Oral, resp. rate 18, height 5\' 3"  (1.6 m), weight 69.87 kg (154 lb 0.6 oz), SpO2 100.00%. Courtney Bradford

## 2014-07-29 NOTE — Progress Notes (Signed)
Patient dropped into 30's again and momentarily went into 2nd degree heart block. Vitals taken and SBP remains elevated.  [name] paged, awaiting call back. Will continue to monitor. Blood pressure 180/67, pulse 65, temperature 98.1 F (36.7 C), temperature source Oral, resp. rate 18, height 5\' 3"  (1.6 m), weight 69.87 kg (154 lb 0.6 oz), SpO2 100.00%. Courtney Bradford

## 2014-07-30 DIAGNOSIS — I509 Heart failure, unspecified: Secondary | ICD-10-CM

## 2014-07-30 DIAGNOSIS — I4892 Unspecified atrial flutter: Secondary | ICD-10-CM

## 2014-07-30 DIAGNOSIS — I10 Essential (primary) hypertension: Secondary | ICD-10-CM

## 2014-07-30 DIAGNOSIS — E119 Type 2 diabetes mellitus without complications: Secondary | ICD-10-CM

## 2014-07-30 DIAGNOSIS — I5031 Acute diastolic (congestive) heart failure: Secondary | ICD-10-CM

## 2014-07-30 LAB — CBC
HEMATOCRIT: 27.3 % — AB (ref 36.0–46.0)
Hemoglobin: 8.8 g/dL — ABNORMAL LOW (ref 12.0–15.0)
MCH: 28.8 pg (ref 26.0–34.0)
MCHC: 32.2 g/dL (ref 30.0–36.0)
MCV: 89.2 fL (ref 78.0–100.0)
Platelets: 647 10*3/uL — ABNORMAL HIGH (ref 150–400)
RBC: 3.06 MIL/uL — ABNORMAL LOW (ref 3.87–5.11)
RDW: 12.9 % (ref 11.5–15.5)
WBC: 12.9 10*3/uL — ABNORMAL HIGH (ref 4.0–10.5)

## 2014-07-30 LAB — BASIC METABOLIC PANEL
Anion gap: 9 (ref 5–15)
BUN: 46 mg/dL — AB (ref 6–23)
CALCIUM: 9.8 mg/dL (ref 8.4–10.5)
CO2: 26 meq/L (ref 19–32)
CREATININE: 1.22 mg/dL — AB (ref 0.50–1.10)
Chloride: 105 mEq/L (ref 96–112)
GFR calc Af Amer: 47 mL/min — ABNORMAL LOW (ref >90)
GFR calc non Af Amer: 41 mL/min — ABNORMAL LOW (ref >90)
GLUCOSE: 113 mg/dL — AB (ref 70–99)
Potassium: 4.8 mEq/L (ref 3.7–5.3)
Sodium: 140 mEq/L (ref 137–147)

## 2014-07-30 LAB — GLUCOSE, CAPILLARY
Glucose-Capillary: 113 mg/dL — ABNORMAL HIGH (ref 70–99)
Glucose-Capillary: 342 mg/dL — ABNORMAL HIGH (ref 70–99)
Glucose-Capillary: 214 mg/dL — ABNORMAL HIGH (ref 70–99)
Glucose-Capillary: 139 mg/dL — ABNORMAL HIGH (ref 70–99)

## 2014-07-30 LAB — MAGNESIUM: Magnesium: 1.7 mg/dL (ref 1.5–2.5)

## 2014-07-30 MED ORDER — FUROSEMIDE 10 MG/ML IJ SOLN
40.0000 mg | Freq: Once | INTRAMUSCULAR | Status: AC
  Administered 2014-07-30: 40 mg via INTRAVENOUS
  Filled 2014-07-30 (×2): qty 4

## 2014-07-30 MED ORDER — PREDNISONE 20 MG PO TABS
20.0000 mg | ORAL_TABLET | Freq: Every day | ORAL | Status: DC
  Administered 2014-07-31: 20 mg via ORAL
  Filled 2014-07-30 (×2): qty 1

## 2014-07-30 NOTE — Progress Notes (Addendum)
Patient seen and examined and agree with note as outlined by Almyra Deforest, PA-C.  Still having episodes of PAF despite Tikosyn.  Her CrCl is on the border of 125 and 229mcg BID so currently on 19mcg BID.  Cannot use BB or CCB due to transient bradycardia.  Await EP recs.  Now with increased LE edema so will give a dose of Lasix 40mg  IV today.  LE edema may be exacerbated by PAF.

## 2014-07-30 NOTE — Progress Notes (Signed)
CMT notified nurse of patient's heart rate dropping to the 30's and when it occurred, patient's rhythm changed to Second degree heartblock but quickly went back to afib aflutter. EKG was done and vital signs taken. Blood pressure was elevated. Patient stated no chest pain or discomfort. She did have some perspiration noted but patient stated she does that all the time. Patient stated she felt fine and assisted her to the bathroom. Notified hospitalist. Orders given to continue to monitor patient. Will continue to monitor patient to end of shift.

## 2014-07-30 NOTE — Consult Note (Signed)
ELECTROPHYSIOLOGY CONSULT NOTE    Patient ID: Courtney Bradford MRN: [id number], DOB/AGE: 1931/07/22 80 y.o.  Admit date: 07/21/2014 Date of Consult: 07-30-2014  Primary Physician: Maggie Font, MD Primary Cardiologist: new to [facility]  Reason for Consultation: atrial fibrillation  HPI:  Courtney Bradford is a 80 y.o. female with a past medical history significant for hypertension and diabetes.  She presented on 07-21-14 for evaluation of knee pain. She was incidentally found to have mild CHF and newly diagnosed atrial fibrillation. Atrial fibrillation rates were occasionally in the 130's and low dose Amiodarone was added for rate control. She had subsequent nocturnal bradycardia and this was changed to Tikosyn.  Due to creatinine clearance, she was started on Tikosyn 120mcg twice daily.  This has failed to maintain SR.  EP has been asked to evaluate for treatment options.   Echo this admission demonstrated EF 0000000, grade 1 diastolic dysfunction, mild AR, LA 40.   Lab work this admission is notable for negative blood cultures, Mg 1.7, creat 1.22, Hgb 8.8, sed rate 118, ANA negative, normal TSH.   She currently denies chest pain, shortness of breath, LE edema, palpitations, dizziness, syncope, recent fevers, chills. She is completely asymptomatic with her atrial fibrillation. She does have persistent knee pain. ROS is otherwise negative.    Past Medical History  Diagnosis Date  . Hypertension   . Diabetes mellitus   . Anemia   . Cataract   . Headache(784.0)   . Arthritis     right knee     Surgical History:  Past Surgical History  Procedure Laterality Date  . Abdominal hysterectomy    . Breast surgery      breast biopsy  . Anterior and posterior repair  05/17/2011    Procedure: ANTERIOR (CYSTOCELE) AND POSTERIOR REPAIR (RECTOCELE);  Surgeon: Eli Hose, MD;  Location: [facility];  Service: Gynecology;  Laterality: N/A;  Anterior repair with TVT bladder sling and  cystoscopy  . Bladder suspension  05/17/2011    Procedure: TRANSVAGINAL TAPE (TVT) PROCEDURE;  Surgeon: Eli Hose, MD;  Location: [facility];  Service: Gynecology;  Laterality: N/A;     Prescriptions prior to admission  Medication Sig Dispense Refill  . aliskiren (TEKTURNA) 150 MG tablet Take 150 mg by mouth daily.       Marland Kitchen amLODipine (NORVASC) 5 MG tablet Take 5 mg by mouth daily.        . cloNIDine (CATAPRES - DOSED IN MG/24 HR) 0.3 mg/24hr patch Place 0.3 mg onto the skin once a week.      . ferrous fumarate (HEMOCYTE - 106 MG FE) 325 (106 FE) MG TABS tablet Take 1 tablet by mouth 2 (two) times daily.      . insulin regular (HUMULIN R) 100 units/mL injection Inject 10 Units into the skin every morning.        . Olopatadine HCl 0.2 % SOLN Place 1 drop into both eyes daily as needed (watery eyes).         Inpatient Medications:  . aliskiren  150 mg Oral Daily  . amLODipine  5 mg Oral Daily  . apixaban  5 mg Oral BID  . cloNIDine  0.3 mg Transdermal Weekly  . dofetilide  125 mcg Oral BID  . ferrous fumarate  1 tablet Oral BID  . furosemide  40 mg Intravenous Once  . insulin aspart  0-15 Units Subcutaneous TID WC  . MUSCLE RUB   Topical QID  . pantoprazole  40 mg Oral Q0600  . predniSONE  30 mg Oral Q breakfast  . sodium chloride  3 mL Intravenous Q12H    Allergies:  Allergies  Allergen Reactions  . Lactose Intolerance (Gi)     Upset stomach    History   Social History  . Marital Status: Single    Spouse Name: N/A    Number of Children: N/A  . Years of Education: N/A   Occupational History  . Not on file.   Social History Main Topics  . Smoking status: Never Smoker   . Smokeless tobacco: Not on file  . Alcohol Use: No  . Drug Use: No  . Sexual Activity: No   Other Topics Concern  . Not on file   Social History Narrative  . No narrative on file     Family History - noncontributory   BP 154/53  Pulse 75  Temp(Src) 98.1 F (36.7 C) (Oral)  Resp 18   Ht 5\' 3"  (1.6 m)  Wt 151 lb 9.6 oz (68.765 kg)  BMI 26.86 kg/m2  SpO2 97%  Physical Exam: stable appearing elderly woman, NAD HEENT: Unremarkable,[place], AT Neck:  6 JVD, no thyromegally Back:  No CVA tenderness Lungs:  Clear with no wheezes, rales, or rhonchi HEART:  IRegular rate rhythm, no murmurs, no rubs, no clicks Abd:  soft, positive bowel sounds, no organomegally, no rebound, no guarding Ext:  2 plus pulses, no edema, no cyanosis, no clubbing Skin:  No rashes no nodules Neuro:  CN II through XII intact, motor grossly intact   Labs:   Lab Results  Component Value Date   WBC 12.9* 07/30/2014   HGB 8.8* 07/30/2014   HCT 27.3* 07/30/2014   MCV 89.2 07/30/2014   PLT 647* 07/30/2014    Recent Labs Lab 07/30/14 0527  NA 140  K 4.8  CL 105  CO2 26  BUN 46*  CREATININE 1.22*  CALCIUM 9.8  GLUCOSE 113*    Radiology/Studies: Dg Chest 2 View 07/22/2014   CLINICAL DATA:  LEG PAIN  EXAM: CHEST  2 VIEW  COMPARISON:  None.  FINDINGS: Mild cardiomegaly is present. Mediastinal silhouettes within normal limits.  The lungs are normally inflated. No airspace consolidation, pleural effusion, or pulmonary edema is identified. There is no pneumothorax.  No acute osseous abnormality identified.  IMPRESSION: 1. No acute cardiopulmonary abnormality. 2. Mild cardiomegaly.   Electronically Signed   By: Jeannine Boga M.D.   On: 07/22/2014 00:38   Dg Ankle Complete Left 07/23/2014   CLINICAL DATA:  Left ankle swelling  EXAM: LEFT ANKLE COMPLETE - 3+ VIEW  COMPARISON:  None.  FINDINGS: Three views of left ankle submitted. No acute fracture or subluxation. There is diffuse osteopenia. Ankle mortise is preserved. Small posterior and plantar spur of calcaneus.  IMPRESSION: No acute fracture or subluxation.  Diffuse osteopenia.   Electronically Signed   By: Lahoma Crocker M.D.   On: 07/23/2014 11:17    [id number] fibrillation, rate 63, normal intervals  TELEMETRY: atrial fibrillation, rates 60-130's,  nocturnal bradycardia, sinus rhythm with 1st degree AV block   A/P 1. Atrial fibrillation 2. Asymptomatic tachybrady syndrome 3. Sinus bradycardia 4. Renal insufficiency  I have discussed the treatment options with the patient and her son. Because she is completely symptomatic, I have recommended stopping Tikosyn, using low dose beta blockers (metoprolol 12.5-25 mg twice daily) and having the patient undergo a 24 hour holter as an outpatient.   Mikle Bosworth.D.

## 2014-07-30 NOTE — Progress Notes (Signed)
Patient Name: Courtney Bradford Date of Encounter: 07/30/2014     Principal Problem:   Leg pain, bilateral Active Problems:   HYPERTENSION   SUI (stress urinary incontinence, female)   Acute diastolic congestive heart failure   Atrial fibrillation-new 07/22/13- CVR- unknown duration   Diabetes mellitus type 2, controlled   UTI (urinary tract infection)    SUBJECTIVE  Denies any CP, SOB or dizziness. No cardiac awareness for a-fib   CURRENT MEDS . aliskiren  150 mg Oral Daily  . amLODipine  5 mg Oral Daily  . apixaban  5 mg Oral BID  . cloNIDine  0.3 mg Transdermal Weekly  . dofetilide  125 mcg Oral BID  . ferrous fumarate  1 tablet Oral BID  . insulin aspart  0-15 Units Subcutaneous TID WC  . MUSCLE RUB   Topical QID  . pantoprazole  40 mg Oral Q0600  . predniSONE  30 mg Oral Q breakfast  . sodium chloride  3 mL Intravenous Q12H    OBJECTIVE  Filed Vitals:   07/30/14 0116 07/30/14 0615 07/30/14 0639 07/30/14 0948  BP: 186/70 159/65  154/53  Pulse: 74 66  75  Temp: 98.6 F (37 C) 98.1 F (36.7 C)    TempSrc: Oral Oral    Resp: 17 18    Height:      Weight:   151 lb 9.6 oz (68.765 kg)   SpO2: 98% 97%     No intake or output data in the 24 hours ending 07/30/14 1200 Filed Weights   07/28/14 0616 07/29/14 0613 07/30/14 0639  Weight: 154 lb 0.6 oz (69.87 kg) 152 lb 5.4 oz (69.1 kg) 151 lb 9.6 oz (68.765 kg)    PHYSICAL EXAM  General: Pleasant, NAD. Neuro: Alert and oriented X 3. Moves all extremities spontaneously. Psych: Normal affect. HEENT:  Normal  Neck: Supple without bruits or JVD. Lungs:  Resp regular and unlabored, CTA. Heart: irregular  no s3, s4, or murmurs. Abdomen: Soft, non-tender, non-distended, BS + x 4.  Extremities: No clubbing, cyanosis. DP/PT/Radials 2+ and equal bilaterally. 1+ ankle edema  Accessory Clinical Findings  CBC  Recent Labs  07/30/14 0527  WBC 12.9*  HGB 8.8*  HCT 27.3*  MCV 89.2  PLT [id number]*   Basic Metabolic  Panel  Recent Labs  07/29/14 0459 07/30/14 0527  NA 142 140  K 4.6 4.8  CL 105 105  CO2 28 26  GLUCOSE 99 113*  BUN 46* 46*  CREATININE 1.17* 1.22*  CALCIUM 9.8 9.8  MG 1.8 1.7    TELE A-fib with HR 60-120s, alternating with sinus brady and 2nd degree AV block with HR down to 30s    ECG  A-fib with HR 60s  Echocardiogram 07/22/2014  Study Conclusions  - Left ventricle: Abnormal septal motion. The cavity size was normal. Wall thickness was increased in a pattern of mild LVH. Systolic function was normal. The estimated ejection fraction was in the range of 55% to 60%. Doppler parameters are consistent with abnormal left ventricular relaxation (grade 1 diastolic dysfunction). - Aortic valve: There was mild regurgitation. - Left atrium: The atrium was mildly dilated. - Atrial septum: No defect or patent foramen ovale was identified.      Radiology/Studies  Dg Chest 2 View  07/22/2014   CLINICAL DATA:  LEG PAIN  EXAM: CHEST  2 VIEW  COMPARISON:  None.  FINDINGS: Mild cardiomegaly is present. Mediastinal silhouettes within normal limits.  The lungs are normally inflated. No  airspace consolidation, pleural effusion, or pulmonary edema is identified. There is no pneumothorax.  No acute osseous abnormality identified.  IMPRESSION: 1. No acute cardiopulmonary abnormality. 2. Mild cardiomegaly.   Electronically Signed   By: Jeannine Boga M.D.   On: 07/22/2014 00:38   Dg Ankle Complete Left  07/23/2014   CLINICAL DATA:  Left ankle swelling  EXAM: LEFT ANKLE COMPLETE - 3+ VIEW  COMPARISON:  None.  FINDINGS: Three views of left ankle submitted. No acute fracture or subluxation. There is diffuse osteopenia. Ankle mortise is preserved. Small posterior and plantar spur of calcaneus.  IMPRESSION: No acute fracture or subluxation.  Diffuse osteopenia.   Electronically Signed   By: Lahoma Crocker M.D.   On: 07/23/2014 11:17    ASSESSMENT AND PLAN  1. Paroxysmal coarse atrial  fibrillation/flutter with component of tachy-brady syndrome   - suspected precipitated by infection/fever   - after discussing with EP, amio stopped, and Tikosyn 176mcg BID started. QTc stable since initiation   - apixaban dosage lowered due to renal function   - continue to have alternating a-fib, occasional transition to sinus brady down to 30s, occasional 2nd degree AV block. A-fib occasionally go up to 120s  - EP consulted yesterday, will see today and make recommendation (medical management vs PPM)   2. Possible component of diastolic CHF, improved   - 2D echo EF 55-60%, mild LVH, grade 1 d/d, mild AI, mild LA dilatation   3. Medical issues of suspected gout/UTI/fever, on abx/prednisone  4. CKD stage III  5. Abnormal CBC with chronic anemia, elevated WBC/platelets  6. HTN: need better control, will increase amlodipine from 5mg  to 10mg  if no further plan by EP   Signed, Woodward Ku Pager: [phone]

## 2014-07-30 NOTE — Progress Notes (Signed)
[facility] Progress Note   Courtney Bradford IBB:[id number] DOB: 1931/10/09 DOA: 07/21/2014 PCP: Maggie Font, MD  Brief narrative: Courtney Bradford is a 80 y.o. female with DM, HTN, anemia admitted with b/l leg pain starting about 4 days prior to admission. She states it started in her right leg and then went to her left leg. In the ER she was found to have a-flutter and possible CHF and given Lasix. No h/o A-flutter in the past.   She continues to have breakthrough flutter/ fib episodes and is being managed by the cardiology service. Overnight patient's HR dropped to 30, patient denies any other complaints.    Subjective: No complaints - doing well  Assessment/Plan:  B/l leg pain -  suspecting disseminated gout or other inflammatory disorder -  d/c'd colchicine as Creatine was rising  - ESR significantly elevated- started Solumedrol resulting in significant improvement in pain- transitioned to Prednisone taper - per son, Oxycodone is making her too sleepy-  d/c'd in favor of Tramadol  - admitting doctor suspected cellulitis and started Vancomycin- no signs of cellulitis on my exam therefore d/c'd on 9/23  - venous duplex ordered negative  - X ray of the left ankle normal    ARF -due to Lasix, poor PO intake and Colchicine- d/c'd  - renal function improved     Atrial flutter- new diagnosis - TSH FT4 normal- free T3 slightly low- follow -started on Apixiban - managed by cardiology Echocardiogram reveals LVEF of 55% and grade 1 diastolic dysfunction.   UTI- fevers -culture negative x 2- patient asymptomatic . Currently on rocephin started 9/23- will give 7 day course for complicated UTI - As per the patient and son at bedside, she has a h/o cystocele and rectocele and underwent transvaginal tape procedure in 2012, but she has recurrence of the prolapse with subsequent multiple urinary tract infections. Completed the course of antibiotics.    Diabetes mellitus type 2,  controlled - cont sliding scale - follow on steroids  CBG (last 3)   Recent Labs  07/29/14 2152 07/30/14 0614 07/30/14 1208  GLUCAP 123* 113* 342*        HYPERTENSION, BENIGN - Clonidine, aliskiren and Amlodipine  Mild diastolic heart failure: Appears to be compensated.   CKD 3 - follow  Iron deficiency anemia? - on Iron supplements at home.  Anemia panel ordered and consistent with AOCD  - severely elevated ferretin suggestive of underlying inflammation.  Hypokalemia: replete as needed.    Code Status: Full code Family Communication: son at bedside.  Disposition Plan: home-  DVT prophylaxis: Eliquis  Consultants: cardiology  Procedures: None  Antibiotics: Anti-infectives   Start     Dose/Rate Route Frequency Ordered Stop   07/22/14 1830  cefTRIAXone (ROCEPHIN) 1 g in dextrose 5 % 50 mL IVPB  Status:  Discontinued     1 g 100 mL/hr over 30 Minutes Intravenous Every 24 hours 07/22/14 1757 07/29/14 1624   07/22/14 0500  vancomycin (VANCOCIN) IVPB 750 mg/150 ml premix  Status:  Discontinued     750 mg 150 mL/hr over 60 Minutes Intravenous Every 24 hours 07/22/14 0428 07/23/14 1028         Objective: Filed Weights   07/28/14 0616 07/29/14 0613 07/30/14 0639  Weight: 69.87 kg (154 lb 0.6 oz) 69.1 kg (152 lb 5.4 oz) 68.765 kg (151 lb 9.6 oz)    Intake/Output Summary (Last 24 hours) at 07/30/14 1518 Last data filed at 07/30/14 1404  Gross per 24 hour  Intake    220 ml  Output    200 ml  Net     20 ml     Vitals Filed Vitals:   07/30/14 0615 07/30/14 0639 07/30/14 0948 07/30/14 1300  BP: 159/65  154/53 152/75  Pulse: 66  75 86  Temp: 98.1 F (36.7 C)   98.6 F (37 C)  TempSrc: Oral   Oral  Resp: 18   18  Height:      Weight:  68.765 kg (151 lb 9.6 oz)    SpO2: 97%   100%    Exam: General: No acute respiratory distress Lungs: Clear to auscultation bilaterally without wheezes or crackles Cardiovascular: Regular rate and rhythm without  murmur gallop or rub normal S1 and S2 Abdomen: Nontender, nondistended, soft, bowel sounds positive, no rebound, no ascites, no appreciable mass Extremities: No significant cyanosis, clubbing or edema in LE  Data Reviewed: Basic Metabolic Panel:  Recent Labs Lab 07/27/14 0407 07/27/14 2117 07/28/14 0435 07/29/14 0459 07/30/14 0527  NA 141 140 141 142 140  K 4.3 4.7 4.7 4.6 4.8  CL 106 104 107 105 105  CO2 _0 GLUCOSE 130* 154* 125* 99 113*  BUN 59* 56* 52* 46* 46*  CREATININE 1.23* 1.33* 1.20* 1.17* 1.22*  CALCIUM 9.5 9.8 9.5 9.8 9.8  MG  --  1.9 1.8 1.8 1.7   Liver Function Tests: No results found for this basename: AST, ALT, ALKPHOS, BILITOT, PROT, ALBUMIN,  in the last 168 hours No results found for this basename: LIPASE, AMYLASE,  in the last 168 hours No results found for this basename: AMMONIA,  in the last 168 hours CBC:  Recent Labs Lab 07/24/14 0421 07/25/14 0840 07/26/14 0840 07/30/14 0527  WBC 12.5* 15.3* 12.2* 12.9*  NEUTROABS  --   --  11.0*  --   HGB 9.0* 9.5* 9.3* 8.8*  HCT 27.3* 28.4* 28.4* 27.3*  MCV 86.1 85.8 88.5 89.2  PLT 337 405* 499* 647*   Cardiac Enzymes: No results found for this basename: CKTOTAL, CKMB, CKMBINDEX, TROPONINI,  in the last 168 hours BNP (last 3 results)  Recent Labs  07/21/14 2321  PROBNP 989.3*   CBG:  Recent Labs Lab 07/29/14 1140 07/29/14 1708 07/29/14 2152 07/30/14 0614 07/30/14 1208  GLUCAP 254* 215* 123* 113* 342*    Recent Results (from the past 240 hour(s))  CULTURE, BLOOD (ROUTINE X 2)     Status: None   Collection Time    07/22/14  5:50 AM      Result Value Ref Range Status   Specimen Description BLOOD LEFT HAND   Final   Special Requests BOTTLES DRAWN AEROBIC ONLY [facility]   Final   Culture  Setup Time     Final   Value: 07/22/2014 08:38     Performed at [place]     Final   Value: NO GROWTH 5 DAYS     Performed at [facility]   Report Status  07/28/2014 FINAL   Final  CULTURE, BLOOD (ROUTINE X 2)     Status: None   Collection Time    07/22/14  5:55 AM      Result Value Ref Range Status   Specimen Description BLOOD RIGHT HAND   Final   Special Requests BOTTLES DRAWN AEROBIC ONLY [facility]   Final   Culture  Setup Time     Final   Value: 07/22/2014 08:37     Performed  at [facility]   Culture     Final   Value: NO GROWTH 5 DAYS     Performed at [facility]   Report Status 07/28/2014 FINAL   Final  URINE CULTURE     Status: None   Collection Time    07/24/14  5:32 AM      Result Value Ref Range Status   Specimen Description URINE, CATHETERIZED   Final   Special Requests NONE   Final   Culture  Setup Time     Final   Value: 07/24/2014 09:28     Performed at [place]     Final   Value: NO GROWTH     Performed at [facility]   Culture     Final   Value: NO GROWTH     Performed at [facility]   Report Status 07/25/2014 FINAL   Final     Studies:  Recent x-ray studies have been reviewed in detail by the Attending Physician  Scheduled Meds:  Scheduled Meds: . aliskiren  150 mg Oral Daily  . amLODipine  5 mg Oral Daily  . apixaban  5 mg Oral BID  . cloNIDine  0.3 mg Transdermal Weekly  . dofetilide  125 mcg Oral BID  . ferrous fumarate  1 tablet Oral BID  . insulin aspart  0-15 Units Subcutaneous TID WC  . MUSCLE RUB   Topical QID  . pantoprazole  40 mg Oral Q0600  . predniSONE  30 mg Oral Q breakfast  . sodium chloride  3 mL Intravenous Q12H   Continuous Infusions:    Time spent on care of this patient: 25 min   Prophet Renwick, MD 07/30/2014, 3:18 PM  LOS: 9 days   [facility] Office  [phone] Pager - Text Page per [email or web address]  If 7PM-7AM, please contact night-coverage [email or web address]

## 2014-07-30 NOTE — Progress Notes (Signed)
On-call Cardiologist returned page and stated not able to discharge patient. Doctor stated that Electrophysiology did not report to Dr. Radford Pax the results of evaluation for the need of a pacer and therefore is not able to discharge patient at this time. Patient was told that since she is not getting a pacer that she could go home but this was not passed on which caused some confusion for the patient and family. Attending doctor was able to speak to family and notify them that tomorrow morning patient will be ready for discharge. Patient back on the heart monitor and notified CMT. Will continue to monitor patient to end of shift.

## 2014-07-30 NOTE — Progress Notes (Signed)
Patient refused to have pacemaker placed when cardiologist came in this evening. Patient and family was told that she would be discharged today, but we never received discharge orders.  Paged Rosanne Sack at shift change with no return call. Also paged night hospitalist Dr. Lilla Shook. Md not able to put in discharge orders for patient due to not knowing patient. Cargiologist Fransico Him has also been paged. Awaiting call back. Night Nurse is aware.

## 2014-07-30 NOTE — Progress Notes (Signed)
Physical Therapy Treatment Patient Details Name: Courtney Bradford MRN: [id number] DOB: Feb 04, 1931 Today's Date: 07/30/2014    History of Present Illness Pt is an 80 y.o. female with history of DM, HTN,  chronic anemia and arthritis who has been experiencing increasing pain in her left leg since last Saturday (4 days PTA). EKG revealed new onset atrial flutter.    PT Comments    Pt making great progress towards goals.  Discussed S at home and she states that daughter plans to stay with her for a few days when she D/C's and has nieces that can check on her.  Also discussed meal prep with pt and she states that there is a place near by that delivers her meals.  She does not have stairs to enter home and she lives at an independent living facility with accessible apt.    Follow Up Recommendations  Home health PT;Supervision/Assistance - 24 hour     Equipment Recommendations  Rolling walker with 5" wheels;3in1 (PT)    Recommendations for Other Services       Precautions / Restrictions Precautions Precautions: Fall Restrictions Weight Bearing Restrictions: No    Mobility  Bed Mobility Overal bed mobility:  (pt in recliner when PT arrived)                Transfers Overall transfer level: Needs assistance Equipment used: Rolling walker (2 wheeled) Transfers: Sit to/from Stand Sit to Stand: Supervision         General transfer comment: Continues to require mod cues for hand placement and controlled descent when sitting.   Ambulation/Gait Ambulation/Gait assistance: Supervision Ambulation Distance (Feet): 100 Feet (x 2 reps) Assistive device: Rolling walker (2 wheeled) Gait Pattern/deviations: Step-through pattern;Decreased stride length;Trunk flexed Gait velocity: very slow   General Gait Details: continuous verbal cues for posture and technique when side stepping with RW in room   Stairs Stairs:  (Pt with no stairs at home)          Wheelchair Mobility     Modified Rankin (Stroke Patients Only)       Balance Overall balance assessment: Needs assistance Sitting-balance support: Feet supported Sitting balance-Leahy Scale: Good     Standing balance support: Bilateral upper extremity supported;During functional activity Standing balance-Leahy Scale: Fair Standing balance comment: Pt able to stand with close S with RW                    Cognition Arousal/Alertness: Awake/alert Behavior During Therapy: WFL for tasks assessed/performed Overall Cognitive Status: Within Functional Limits for tasks assessed                      Exercises Other Exercises Other Exercises: Performed sit<>stand x 5 reps, seated LAQ's x 10 reps BLE, seated marching x 10 reps BLE, and seated hip add w/ pillow squeeze x 10 reps    General Comments        Pertinent Vitals/Pain Pain Assessment: No/denies pain    Home Living                      Prior Function            PT Goals (current goals can now be found in the care plan section) Acute Rehab PT Goals Patient Stated Goal: To return home independently PT Goal Formulation: With patient Time For Goal Achievement: 08/07/14 Potential to Achieve Goals: Good Progress towards PT goals: Progressing toward goals    Frequency  Min 3X/week    PT Plan Current plan remains appropriate    Co-evaluation             End of Session   Activity Tolerance: Patient tolerated treatment well Patient left: in chair;with call bell/phone within reach     Time: 0922-0942 PT Time Calculation (min): 20 min  Charges:  $Gait Training: 8-22 mins                    G Codes:      Denice Bors 07/30/2014, 9:46 AM

## 2014-07-31 ENCOUNTER — Other Ambulatory Visit: Payer: Self-pay | Admitting: Physician Assistant

## 2014-07-31 DIAGNOSIS — I495 Sick sinus syndrome: Secondary | ICD-10-CM

## 2014-07-31 LAB — MAGNESIUM: Magnesium: 1.6 mg/dL (ref 1.5–2.5)

## 2014-07-31 LAB — GLUCOSE, CAPILLARY
Glucose-Capillary: 112 mg/dL — ABNORMAL HIGH (ref 70–99)
Glucose-Capillary: 284 mg/dL — ABNORMAL HIGH (ref 70–99)

## 2014-07-31 LAB — BASIC METABOLIC PANEL
Anion gap: 10 (ref 5–15)
BUN: 46 mg/dL — ABNORMAL HIGH (ref 6–23)
CHLORIDE: 104 meq/L (ref 96–112)
CO2: 27 mEq/L (ref 19–32)
Calcium: 9.4 mg/dL (ref 8.4–10.5)
Creatinine, Ser: 1.33 mg/dL — ABNORMAL HIGH (ref 0.50–1.10)
GFR calc non Af Amer: 37 mL/min — ABNORMAL LOW (ref >90)
GFR, EST AFRICAN AMERICAN: 43 mL/min — AB (ref >90)
Glucose, Bld: 106 mg/dL — ABNORMAL HIGH (ref 70–99)
Potassium: 4.5 mEq/L (ref 3.7–5.3)
Sodium: 141 mEq/L (ref 137–147)

## 2014-07-31 MED ORDER — METOPROLOL TARTRATE 12.5 MG HALF TABLET
12.5000 mg | ORAL_TABLET | Freq: Every day | ORAL | Status: DC
  Administered 2014-07-31: 12.5 mg via ORAL
  Filled 2014-07-31: qty 1

## 2014-07-31 MED ORDER — PREDNISONE 20 MG PO TABS: ORAL_TABLET | ORAL | Status: DC

## 2014-07-31 MED ORDER — APIXABAN 5 MG PO TABS: 5.0000 mg | ORAL_TABLET | Freq: Two times a day (BID) | ORAL | Status: DC

## 2014-07-31 MED ORDER — METOPROLOL TARTRATE 12.5 MG HALF TABLET: 12.5000 mg | ORAL_TABLET | Freq: Every day | ORAL | Status: DC

## 2014-07-31 NOTE — Discharge Summary (Signed)
Physician Discharge Summary  RANELLE AUKER VEH:[id number] DOB: 1931-08-20 DOA: 07/21/2014  PCP: Maggie Font, MD  Admit date: 07/21/2014 Discharge date: 07/31/2014  Time spent: 30 minutes  Recommendations for Outpatient Follow-up:  1. Follow up with PCP IN ONE WEEK. 2. Follow up with cardiology as recommended  Discharge Diagnoses:  Principal Problem:   Leg pain, bilateral Active Problems:   HYPERTENSION   SUI (stress urinary incontinence, female)   Acute diastolic congestive heart failure   Atrial fibrillation-new 07/22/13- CVR- unknown duration   Diabetes mellitus type 2, controlled   UTI (urinary tract infection)   Discharge Condition: improved  Diet recommendation: low sodium diet  Filed Weights   07/29/14 0613 07/30/14 0639 07/31/14 0611  Weight: 69.1 kg (152 lb 5.4 oz) 68.765 kg (151 lb 9.6 oz) 69.491 kg (153 lb 3.2 oz)    History of present illness:  Courtney Bradford is a 80 y.o. female with DM, HTN, anemia admitted with b/l leg pain starting about 4 days prior to admission. She states it started in her right leg and then went to her left leg.  In the ER she was found to have a-flutter and possible CHF and given Lasix. No h/o A-flutter in the past.  She continues to have breakthrough flutter/ fib episodes and is being managed by the cardiology service   Hospital Course:  B/l leg pain  - suspecting disseminated gout or other inflammatory disorder  - ESR significantly elevated- started Solumedrol resulting in significant improvement in pain- transitioned to Prednisone taper  - per son, Oxycodone is making her too sleepy- d/c'd in favor of Tramadol . - admitting doctor suspected cellulitis and started Vancomycin- no signs of cellulitis on my exam therefore d/c'd on 9/23  - venous duplex ordered negative  - X ray of the left ankle normal   ARF  -due to Lasix, poor PO intake and Colchicine- d/c'd  - renal function improved   Atrial flutter- new diagnosis  - TSH FT4  normal- free T3 slightly low- -started on Apixiban  - managed by cardiology , EP consulted and recommended low dose b blocker 12.5 mg daily on discharge. Echocardiogram reveals LVEF of 55% and grade 1 diastolic dysfunction.   UTI- fevers  -culture negative x 2- patient asymptomatic . Currently on rocephin started 9/23-  Completed the course of antibiotics, for 7 day course for complicated UTI  - As per the patient and son at bedside, she has a h/o cystocele and rectocele and underwent transvaginal tape procedure in 2012, but she has recurrence of the prolapse with subsequent multiple urinary tract infections.    Diabetes mellitus type 2, controlled  - cont sliding scale  - follow on steroids  CBG (last 3)   Recent Labs   07/29/14 2152  07/30/14 0614  07/30/14 1208   GLUCAP  123*  113*  342*    HYPERTENSION, BENIGN  - Clonidine, aliskiren and Amlodipine  Mild diastolic heart failure:  Appears to be compensated.  CKD 3  - follow  Iron deficiency anemia?  - on Iron supplements at home.  Anemia panel ordered and consistent with AOCD  - severely elevated ferretin suggestive of underlying inflammation.  Hypokalemia: replete as needed.    Procedures:  none  Consultations:  cardiology  Discharge Exam: Filed Vitals:   07/31/14 0540  BP: 157/72  Pulse: 65  Temp: 98 F (36.7 C)  Resp: 18    General: alert afebrile Cardiovascular: s1s2 Respiratory: ctab  Discharge Instructions You were  cared for by a hospitalist during your hospital stay. If you have any questions about your discharge medications or the care you received while you were in the hospital after you are discharged, you can call the unit and asked to speak with the hospitalist on call if the hospitalist that took care of you is not available. Once you are discharged, your primary care physician will handle any further medical issues. Please note that NO REFILLS for any discharge medications will be authorized  once you are discharged, as it is imperative that you return to your primary care physician (or establish a relationship with a primary care physician if you do not have one) for your aftercare needs so that they can reassess your need for medications and monitor your lab values.  Discharge Instructions   Diet - low sodium heart healthy    Complete by:  As directed      Discharge instructions    Complete by:  As directed   Follow up with PCP in one week.  Follow up with cardiology office to pick up the holter monitor.          Current Discharge Medication List    START taking these medications   Details  apixaban (ELIQUIS) 5 MG TABS tablet Take 1 tablet (5 mg total) by mouth 2 (two) times daily. Qty: 60 tablet, Refills: 1    metoprolol tartrate (LOPRESSOR) 12.5 mg TABS tablet Take 0.5 tablets (12.5 mg total) by mouth daily. Qty: 30 tablet, Refills: 1    predniSONE (DELTASONE) 20 MG tablet Prednisone 20 mg daily for 1 day followed by  Prednisone 10 mg daily for 2 days and stop. Qty: 2 tablet, Refills: 0      CONTINUE these medications which have NOT CHANGED   Details  aliskiren (TEKTURNA) 150 MG tablet Take 150 mg by mouth daily.     amLODipine (NORVASC) 5 MG tablet Take 5 mg by mouth daily.      cloNIDine (CATAPRES - DOSED IN MG/24 HR) 0.3 mg/24hr patch Place 0.3 mg onto the skin once a week.    ferrous fumarate (HEMOCYTE - 106 MG FE) 325 (106 FE) MG TABS tablet Take 1 tablet by mouth 2 (two) times daily.    insulin regular (HUMULIN R) 100 units/mL injection Inject 10 Units into the skin every morning.      Olopatadine HCl 0.2 % SOLN Place 1 drop into both eyes daily as needed (watery eyes).        Allergies  Allergen Reactions  . Lactose Intolerance (Gi)     Upset stomach   Follow-up Information   Follow up with [facility] R, NP On 08/14/2014. (1:30pm. Call office if you have not heard back in 3 days.)    Specialty:  Cardiology   Contact information:   [address] [place] [address] [phone]       Follow up with [facility] On 08/04/2014. (2:00pm To pick up 30 day event monitor)    Specialty:  Cardiology   Contact information:   [address], [address] [place] [address] [phone]      Follow up with Maggie Font, MD.   Specialty:  Family Medicine   Contact information:   [address] [place] [address] [phone]       Follow up with [facility] K, MD. Schedule an appointment as soon as possible for a visit in 1 week.   Specialty:  Family Medicine   Contact information:   [address] [place] [address] [phone]        The results of significant diagnostics from this hospitalization (including imaging, microbiology, ancillary and laboratory) are listed below for reference.    Significant Diagnostic Studies: Dg Chest 2 View  07/22/2014   CLINICAL DATA:  LEG PAIN  EXAM: CHEST  2 VIEW  COMPARISON:  None.  FINDINGS: Mild cardiomegaly is present. Mediastinal silhouettes within normal limits.  The lungs are normally inflated. No airspace consolidation, pleural effusion, or pulmonary edema is identified. There is no pneumothorax.  No acute osseous abnormality identified.  IMPRESSION: 1. No acute cardiopulmonary abnormality. 2. Mild cardiomegaly.   Electronically Signed   By: Jeannine Boga M.D.   On: 07/22/2014 00:38   Dg Ankle Complete Left  07/23/2014   CLINICAL DATA:  Left ankle swelling  EXAM: LEFT ANKLE COMPLETE - 3+ VIEW  COMPARISON:  None.  FINDINGS: Three views of left ankle submitted. No acute fracture or subluxation. There is diffuse osteopenia. Ankle mortise is preserved. Small posterior and plantar spur of calcaneus.  IMPRESSION: No acute fracture or subluxation.  Diffuse osteopenia.   Electronically Signed   By: Lahoma Crocker M.D.   On: 07/23/2014 11:17    Microbiology: Recent Results (from the past 240 hour(s))  CULTURE, BLOOD (ROUTINE X 2)      Status: None   Collection Time    07/22/14  5:50 AM      Result Value Ref Range Status   Specimen Description BLOOD LEFT HAND   Final   Special Requests BOTTLES DRAWN AEROBIC ONLY [facility]   Final   Culture  Setup Time     Final   Value: 07/22/2014 08:38     Performed at [facility]   Culture     Final   Value: NO GROWTH 5 DAYS     Performed at [facility]   Report Status 07/28/2014 FINAL   Final  CULTURE, BLOOD (ROUTINE X 2)     Status: None   Collection Time    07/22/14  5:55 AM      Result Value Ref Range Status   Specimen Description BLOOD RIGHT HAND   Final   Special Requests BOTTLES DRAWN AEROBIC ONLY [facility]   Final   Culture  Setup Time     Final   Value: 07/22/2014 08:37     Performed at [facility]   Culture     Final   Value: NO GROWTH 5 DAYS     Performed at [facility]   Report Status 07/28/2014 FINAL   Final  URINE CULTURE     Status: None   Collection Time    07/24/14  5:32 AM      Result Value Ref Range Status   Specimen Description URINE, CATHETERIZED   Final   Special Requests NONE   Final   Culture  Setup Time     Final   Value: 07/24/2014 09:28     Performed at [place]     Final   Value: NO GROWTH     Performed at [facility]   Culture     Final   Value: NO GROWTH     Performed at [facility]   Report Status 07/25/2014 FINAL   Final     Labs: Basic Metabolic Panel:  Recent Labs Lab 07/27/14 2117 07/28/14 0435 07/29/14 0459  07/30/14 0527 07/31/14 0350  NA 140 141 142 140 141  K 4.7 4.7 4.6 4.8 4.5  CL 104 107 105 105 104  CO2 '23 23 28 26 27  ' GLUCOSE 154* 125* 99 113* 106*  BUN 56* 52* 46* 46* 46*  CREATININE 1.33* 1.20* 1.17* 1.22* 1.33*  CALCIUM 9.8 9.5 9.8 9.8 9.4  MG 1.9 1.8 1.8 1.7 1.6   Liver Function Tests: No results found for this basename: AST, ALT, ALKPHOS, BILITOT, PROT, ALBUMIN,  in the last 168 hours No results found for this basename: LIPASE,  AMYLASE,  in the last 168 hours No results found for this basename: AMMONIA,  in the last 168 hours CBC:  Recent Labs Lab 07/25/14 0840 07/26/14 0840 07/30/14 0527  WBC 15.3* 12.2* 12.9*  NEUTROABS  --  11.0*  --   HGB 9.5* 9.3* 8.8*  HCT 28.4* 28.4* 27.3*  MCV 85.8 88.5 89.2  PLT 405* 499* 647*   Cardiac Enzymes: No results found for this basename: CKTOTAL, CKMB, CKMBINDEX, TROPONINI,  in the last 168 hours BNP: BNP (last 3 results)  Recent Labs  07/21/14 2321  PROBNP 989.3*   CBG:  Recent Labs Lab 07/30/14 1208 07/30/14 1608 07/30/14 2151 07/31/14 0731 07/31/14 1102  GLUCAP 342* 214* 139* 112* 284*       Signed:  Mariacristina Aday  [facility] 07/31/2014, 1:18 PM

## 2014-07-31 NOTE — Progress Notes (Signed)
Patient slept through the night with about 2-3  occurrences of bradycardia and at each time patient was sleep. Nurse did awaken patient at each occurrence and at each time heart rate increased up to the 60's-70's and was asymptomatic. EKG done this morning. Patient given a bath by the Nurse tech. Patient is now dry and clean and in bed alert and calm. Will continue to monitor patient to end of shift.

## 2014-07-31 NOTE — Progress Notes (Signed)
Patient and family given discharge instructions and all questions answered.  Patient discharged via wheelchair with all belongings. 

## 2014-07-31 NOTE — Progress Notes (Signed)
Dr. Tanna Furry recommendation noted last night. Stop Tykosyn and add 12.5mg  metoprolol on discharge. Per Radford Pax, will upgrade from 24 hr holter monitor to 30 day event monitor. I have called office, follow up appt and event monitor pick date scheduled.   Hilbert Corrigan PA Pager: [phone]

## 2014-08-04 ENCOUNTER — Encounter: Payer: Self-pay | Admitting: *Deleted

## 2014-08-04 NOTE — Progress Notes (Signed)
Patient ID: Courtney Bradford, female   DOB: Apr 04, 1931, 80 y.o.   MRN: [id number] Patient did not show up for 08/04/2014 2:00 PM appointment to have her cardiac event monitor applied.

## 2014-08-12 ENCOUNTER — Encounter: Payer: Self-pay | Admitting: *Deleted

## 2014-08-12 NOTE — Progress Notes (Signed)
Patient ID: Courtney Bradford, female   DOB: January 11, 1931, 80 y.o.   MRN: [id number] Patient enrolled for [employer] to mail a 30 day cardiac event monitor to her home.

## 2014-08-13 ENCOUNTER — Ambulatory Visit: Payer: PRIVATE HEALTH INSURANCE | Admitting: Cardiology

## 2014-08-14 ENCOUNTER — Ambulatory Visit: Payer: PRIVATE HEALTH INSURANCE | Admitting: Cardiology

## 2014-08-14 ENCOUNTER — Encounter: Payer: PRIVATE HEALTH INSURANCE | Admitting: Pharmacist Clinician (PhC)/ Clinical Pharmacy Specialist

## 2014-10-26 ENCOUNTER — Telehealth: Payer: Self-pay | Admitting: *Deleted

## 2014-10-26 NOTE — Telephone Encounter (Signed)
Request was made to have [employer] mail a 30 day cardiac event monitor to patient.  Monitor was delivered per [facility] records, however, patient never sent a baseline recording to start the monitor.  [employer] attempted several times to contact patient.  10/04/2014  [employer] sent a final notice for patient to initiate service, return monitor, or be billed for unreturned equipment. Patient was asked  to please contact [employer] and call our office to inform us of her intentions.

## 2014-11-28 DIAGNOSIS — I5040 Unspecified combined systolic (congestive) and diastolic (congestive) heart failure: Secondary | ICD-10-CM | POA: Diagnosis not present

## 2014-12-28 DIAGNOSIS — I5040 Unspecified combined systolic (congestive) and diastolic (congestive) heart failure: Secondary | ICD-10-CM | POA: Diagnosis not present

## 2015-01-27 DIAGNOSIS — I5040 Unspecified combined systolic (congestive) and diastolic (congestive) heart failure: Secondary | ICD-10-CM | POA: Diagnosis not present

## 2015-01-29 DIAGNOSIS — I5040 Unspecified combined systolic (congestive) and diastolic (congestive) heart failure: Secondary | ICD-10-CM | POA: Diagnosis not present

## 2015-02-27 DIAGNOSIS — I5040 Unspecified combined systolic (congestive) and diastolic (congestive) heart failure: Secondary | ICD-10-CM | POA: Diagnosis not present

## 2015-02-28 DIAGNOSIS — I5040 Unspecified combined systolic (congestive) and diastolic (congestive) heart failure: Secondary | ICD-10-CM | POA: Diagnosis not present

## 2015-03-03 DIAGNOSIS — N183 Chronic kidney disease, stage 3 (moderate): Secondary | ICD-10-CM | POA: Diagnosis not present

## 2015-03-03 DIAGNOSIS — E1122 Type 2 diabetes mellitus with diabetic chronic kidney disease: Secondary | ICD-10-CM | POA: Diagnosis not present

## 2015-03-03 DIAGNOSIS — I10 Essential (primary) hypertension: Secondary | ICD-10-CM | POA: Diagnosis not present

## 2015-03-29 DIAGNOSIS — I5040 Unspecified combined systolic (congestive) and diastolic (congestive) heart failure: Secondary | ICD-10-CM | POA: Diagnosis not present

## 2015-04-07 DIAGNOSIS — E119 Type 2 diabetes mellitus without complications: Secondary | ICD-10-CM | POA: Diagnosis not present

## 2015-04-07 DIAGNOSIS — E1165 Type 2 diabetes mellitus with hyperglycemia: Secondary | ICD-10-CM | POA: Diagnosis not present

## 2015-04-07 DIAGNOSIS — I10 Essential (primary) hypertension: Secondary | ICD-10-CM | POA: Diagnosis not present

## 2015-04-29 DIAGNOSIS — I5040 Unspecified combined systolic (congestive) and diastolic (congestive) heart failure: Secondary | ICD-10-CM | POA: Diagnosis not present

## 2015-06-07 DIAGNOSIS — E1122 Type 2 diabetes mellitus with diabetic chronic kidney disease: Secondary | ICD-10-CM | POA: Diagnosis not present

## 2015-06-07 DIAGNOSIS — I10 Essential (primary) hypertension: Secondary | ICD-10-CM | POA: Diagnosis not present

## 2015-09-20 DIAGNOSIS — Z23 Encounter for immunization: Secondary | ICD-10-CM | POA: Diagnosis not present

## 2015-09-20 DIAGNOSIS — I10 Essential (primary) hypertension: Secondary | ICD-10-CM | POA: Diagnosis not present

## 2015-09-20 DIAGNOSIS — I110 Hypertensive heart disease with heart failure: Secondary | ICD-10-CM | POA: Diagnosis not present

## 2015-09-20 DIAGNOSIS — E1122 Type 2 diabetes mellitus with diabetic chronic kidney disease: Secondary | ICD-10-CM | POA: Diagnosis not present

## 2015-11-11 ENCOUNTER — Observation Stay (HOSPITAL_COMMUNITY)
Admission: EM | Admit: 2015-11-11 | Discharge: 2015-11-14 | Disposition: A | Discharge status: Home Health | Payer: Medicare Other | Attending: Internal Medicine | Admitting: Internal Medicine

## 2015-11-11 ENCOUNTER — Encounter (HOSPITAL_COMMUNITY): Payer: Self-pay | Admitting: Emergency Medicine

## 2015-11-11 DIAGNOSIS — Z794 Long term (current) use of insulin: Secondary | ICD-10-CM | POA: Insufficient documentation

## 2015-11-11 DIAGNOSIS — Z79899 Other long term (current) drug therapy: Secondary | ICD-10-CM | POA: Insufficient documentation

## 2015-11-11 DIAGNOSIS — E1129 Type 2 diabetes mellitus with other diabetic kidney complication: Secondary | ICD-10-CM

## 2015-11-11 DIAGNOSIS — I4891 Unspecified atrial fibrillation: Secondary | ICD-10-CM | POA: Insufficient documentation

## 2015-11-11 DIAGNOSIS — D72829 Elevated white blood cell count, unspecified: Secondary | ICD-10-CM | POA: Insufficient documentation

## 2015-11-11 DIAGNOSIS — Z7901 Long term (current) use of anticoagulants: Secondary | ICD-10-CM | POA: Insufficient documentation

## 2015-11-11 DIAGNOSIS — M1711 Unilateral primary osteoarthritis, right knee: Secondary | ICD-10-CM | POA: Insufficient documentation

## 2015-11-11 DIAGNOSIS — M10031 Idiopathic gout, right wrist: Secondary | ICD-10-CM | POA: Diagnosis not present

## 2015-11-11 DIAGNOSIS — N183 Chronic kidney disease, stage 3 unspecified: Secondary | ICD-10-CM | POA: Diagnosis present

## 2015-11-11 DIAGNOSIS — Z9119 Patient's noncompliance with other medical treatment and regimen: Secondary | ICD-10-CM | POA: Diagnosis not present

## 2015-11-11 DIAGNOSIS — D638 Anemia in other chronic diseases classified elsewhere: Secondary | ICD-10-CM | POA: Diagnosis not present

## 2015-11-11 DIAGNOSIS — N390 Urinary tract infection, site not specified: Secondary | ICD-10-CM | POA: Insufficient documentation

## 2015-11-11 DIAGNOSIS — E875 Hyperkalemia: Secondary | ICD-10-CM | POA: Insufficient documentation

## 2015-11-11 DIAGNOSIS — D649 Anemia, unspecified: Secondary | ICD-10-CM | POA: Diagnosis present

## 2015-11-11 DIAGNOSIS — E1165 Type 2 diabetes mellitus with hyperglycemia: Secondary | ICD-10-CM | POA: Diagnosis not present

## 2015-11-11 DIAGNOSIS — R262 Difficulty in walking, not elsewhere classified: Secondary | ICD-10-CM | POA: Diagnosis not present

## 2015-11-11 DIAGNOSIS — M109 Gout, unspecified: Secondary | ICD-10-CM | POA: Diagnosis present

## 2015-11-11 DIAGNOSIS — I129 Hypertensive chronic kidney disease with stage 1 through stage 4 chronic kidney disease, or unspecified chronic kidney disease: Secondary | ICD-10-CM | POA: Diagnosis not present

## 2015-11-11 DIAGNOSIS — N179 Acute kidney failure, unspecified: Principal | ICD-10-CM | POA: Insufficient documentation

## 2015-11-11 DIAGNOSIS — I482 Chronic atrial fibrillation: Secondary | ICD-10-CM

## 2015-11-11 DIAGNOSIS — R531 Weakness: Secondary | ICD-10-CM | POA: Diagnosis present

## 2015-11-11 DIAGNOSIS — E1122 Type 2 diabetes mellitus with diabetic chronic kidney disease: Secondary | ICD-10-CM | POA: Insufficient documentation

## 2015-11-11 DIAGNOSIS — N289 Disorder of kidney and ureter, unspecified: Secondary | ICD-10-CM | POA: Diagnosis not present

## 2015-11-11 DIAGNOSIS — E119 Type 2 diabetes mellitus without complications: Secondary | ICD-10-CM

## 2015-11-11 DIAGNOSIS — M6281 Muscle weakness (generalized): Secondary | ICD-10-CM | POA: Diagnosis not present

## 2015-11-11 DIAGNOSIS — E876 Hypokalemia: Secondary | ICD-10-CM | POA: Insufficient documentation

## 2015-11-11 DIAGNOSIS — I10 Essential (primary) hypertension: Secondary | ICD-10-CM | POA: Diagnosis present

## 2015-11-11 HISTORY — DX: Gout, unspecified: M10.9

## 2015-11-11 HISTORY — DX: Unspecified atrial fibrillation: I48.91

## 2015-11-11 LAB — CBC
HEMATOCRIT: 27.7 % — AB (ref 36.0–46.0)
Hemoglobin: 8.7 g/dL — ABNORMAL LOW (ref 12.0–15.0)
MCH: 27.4 pg (ref 26.0–34.0)
MCHC: 31.4 g/dL (ref 30.0–36.0)
MCV: 87.1 fL (ref 78.0–100.0)
Platelets: 301 10*3/uL (ref 150–400)
RBC: 3.18 MIL/uL — ABNORMAL LOW (ref 3.87–5.11)
RDW: 13.2 % (ref 11.5–15.5)
WBC: 10.2 10*3/uL (ref 4.0–10.5)

## 2015-11-11 LAB — BASIC METABOLIC PANEL
Anion gap: 14 (ref 5–15)
BUN: 45 mg/dL — AB (ref 6–20)
CHLORIDE: 105 mmol/L (ref 101–111)
CO2: 23 mmol/L (ref 22–32)
Calcium: 9.3 mg/dL (ref 8.9–10.3)
Creatinine, Ser: 1.68 mg/dL — ABNORMAL HIGH (ref 0.44–1.00)
GFR, EST AFRICAN AMERICAN: 32 mL/min — AB (ref >60)
GFR, EST NON AFRICAN AMERICAN: 27 mL/min — AB (ref >60)
Glucose, Bld: 127 mg/dL — ABNORMAL HIGH (ref 65–99)
POTASSIUM: 3.2 mmol/L — AB (ref 3.5–5.1)
SODIUM: 142 mmol/L (ref 135–145)

## 2015-11-11 LAB — URINALYSIS, ROUTINE W REFLEX MICROSCOPIC
Bilirubin Urine: NEGATIVE
Glucose, UA: NEGATIVE mg/dL
Ketones, ur: NEGATIVE mg/dL
Nitrite: NEGATIVE
PH: 5 (ref 5.0–8.0)
Protein, ur: NEGATIVE mg/dL
Specific Gravity, Urine: 1.013 (ref 1.005–1.030)

## 2015-11-11 LAB — URINE MICROSCOPIC-ADD ON

## 2015-11-11 LAB — GLUCOSE, CAPILLARY
GLUCOSE-CAPILLARY: 124 mg/dL — AB (ref 65–99)
GLUCOSE-CAPILLARY: 306 mg/dL — AB (ref 65–99)

## 2015-11-11 LAB — MAGNESIUM: MAGNESIUM: 1.5 mg/dL — AB (ref 1.7–2.4)

## 2015-11-11 LAB — I-STAT TROPONIN, ED: Troponin i, poc: 0.05 ng/mL (ref 0.00–0.08)

## 2015-11-11 MED ORDER — METOPROLOL TARTRATE 12.5 MG HALF TABLET
12.5000 mg | ORAL_TABLET | Freq: Every day | ORAL | Status: DC
  Administered 2015-11-12 – 2015-11-14 (×3): 12.5 mg via ORAL
  Filled 2015-11-11 (×3): qty 1

## 2015-11-11 MED ORDER — COLCHICINE 0.6 MG PO TABS
0.6000 mg | ORAL_TABLET | Freq: Every day | ORAL | Status: DC
  Administered 2015-11-12 – 2015-11-14 (×3): 0.6 mg via ORAL
  Filled 2015-11-11 (×3): qty 1

## 2015-11-11 MED ORDER — COLCHICINE 0.6 MG PO TABS
0.6000 mg | ORAL_TABLET | Freq: Once | ORAL | Status: AC
  Administered 2015-11-11: 0.6 mg via ORAL
  Filled 2015-11-11: qty 1

## 2015-11-11 MED ORDER — ALISKIREN FUMARATE 150 MG PO TABS
150.0000 mg | ORAL_TABLET | Freq: Every day | ORAL | Status: DC
  Administered 2015-11-11 – 2015-11-14 (×4): 150 mg via ORAL
  Filled 2015-11-11 (×4): qty 1

## 2015-11-11 MED ORDER — SODIUM CHLORIDE 0.9 % IV SOLN
INTRAVENOUS | Status: AC
  Administered 2015-11-11: 14:00:00 via INTRAVENOUS

## 2015-11-11 MED ORDER — ACETAMINOPHEN 650 MG RE SUPP: 650.0000 mg | Freq: Four times a day (QID) | RECTAL | Status: DC | PRN

## 2015-11-11 MED ORDER — ENOXAPARIN SODIUM 30 MG/0.3ML ~~LOC~~ SOLN: 30.0000 mg | SUBCUTANEOUS | Status: DC

## 2015-11-11 MED ORDER — TRAZODONE HCL 50 MG PO TABS
25.0000 mg | ORAL_TABLET | Freq: Every evening | ORAL | Status: DC | PRN
Start: 2015-11-11 — End: 2015-11-14

## 2015-11-11 MED ORDER — POLYETHYLENE GLYCOL 3350 17 G PO PACK
17.0000 g | PACK | Freq: Every day | ORAL | Status: DC | PRN
Start: 2015-11-11 — End: 2015-11-14

## 2015-11-11 MED ORDER — AMLODIPINE BESYLATE 5 MG PO TABS
5.0000 mg | ORAL_TABLET | Freq: Every day | ORAL | Status: DC
  Administered 2015-11-11 – 2015-11-14 (×4): 5 mg via ORAL
  Filled 2015-11-11 (×4): qty 1

## 2015-11-11 MED ORDER — FERROUS FUMARATE 325 (106 FE) MG PO TABS
1.0000 | ORAL_TABLET | Freq: Two times a day (BID) | ORAL | Status: DC
  Administered 2015-11-11 – 2015-11-14 (×7): 106 mg via ORAL
  Filled 2015-11-11 (×9): qty 1

## 2015-11-11 MED ORDER — CEFTRIAXONE SODIUM 1 G IJ SOLR
1.0000 g | INTRAMUSCULAR | Status: DC
  Administered 2015-11-12 – 2015-11-14 (×3): 1 g via INTRAVENOUS
  Filled 2015-11-11 (×5): qty 10

## 2015-11-11 MED ORDER — METHYLPREDNISOLONE SODIUM SUCC 125 MG IJ SOLR
60.0000 mg | Freq: Once | INTRAMUSCULAR | Status: AC
  Administered 2015-11-11: 60 mg via INTRAVENOUS
  Filled 2015-11-11: qty 2

## 2015-11-11 MED ORDER — POTASSIUM CHLORIDE CRYS ER 20 MEQ PO TBCR
40.0000 meq | EXTENDED_RELEASE_TABLET | Freq: Once | ORAL | Status: AC
  Administered 2015-11-11: 40 meq via ORAL
  Filled 2015-11-11: qty 2

## 2015-11-11 MED ORDER — APIXABAN 5 MG PO TABS: 5.0000 mg | ORAL_TABLET | Freq: Two times a day (BID) | ORAL | Status: DC

## 2015-11-11 MED ORDER — SODIUM CHLORIDE 0.9 % IV BOLUS (SEPSIS): 500.0000 mL | Freq: Once | INTRAVENOUS | Status: DC

## 2015-11-11 MED ORDER — PREDNISONE 20 MG PO TABS
40.0000 mg | ORAL_TABLET | Freq: Every day | ORAL | Status: DC
  Administered 2015-11-12: 40 mg via ORAL
  Filled 2015-11-11: qty 2

## 2015-11-11 MED ORDER — INSULIN ASPART 100 UNIT/ML ~~LOC~~ SOLN
0.0000 [IU] | Freq: Every day | SUBCUTANEOUS | Status: DC
  Administered 2015-11-11: 4 [IU] via SUBCUTANEOUS
  Administered 2015-11-12: 2 [IU] via SUBCUTANEOUS

## 2015-11-11 MED ORDER — MAGNESIUM SULFATE IN D5W 10-5 MG/ML-% IV SOLN
1.0000 g | Freq: Once | INTRAVENOUS | Status: AC
  Administered 2015-11-11: 1 g via INTRAVENOUS
  Filled 2015-11-11: qty 100

## 2015-11-11 MED ORDER — DEXTROSE 5 % IV SOLN
1.0000 g | Freq: Once | INTRAVENOUS | Status: DC
  Filled 2015-11-11: qty 10

## 2015-11-11 MED ORDER — INSULIN ASPART 100 UNIT/ML ~~LOC~~ SOLN
0.0000 [IU] | Freq: Three times a day (TID) | SUBCUTANEOUS | Status: DC
  Administered 2015-11-12 (×2): 5 [IU] via SUBCUTANEOUS
  Administered 2015-11-12: 8 [IU] via SUBCUTANEOUS
  Administered 2015-11-13: 2 [IU] via SUBCUTANEOUS
  Administered 2015-11-13: 5 [IU] via SUBCUTANEOUS
  Administered 2015-11-14: 2 [IU] via SUBCUTANEOUS

## 2015-11-11 MED ORDER — ONDANSETRON HCL 4 MG PO TABS: 4.0000 mg | ORAL_TABLET | Freq: Four times a day (QID) | ORAL | Status: DC | PRN

## 2015-11-11 MED ORDER — HYDROCODONE-ACETAMINOPHEN 5-325 MG PO TABS: 1.0000 | ORAL_TABLET | ORAL | Status: DC | PRN

## 2015-11-11 MED ORDER — CLONIDINE HCL 0.3 MG/24HR TD PTWK
0.3000 mg | MEDICATED_PATCH | TRANSDERMAL | Status: DC
  Administered 2015-11-12: 0.3 mg via TRANSDERMAL
  Filled 2015-11-11: qty 1

## 2015-11-11 MED ORDER — ONDANSETRON HCL 4 MG/2ML IJ SOLN: 4.0000 mg | Freq: Four times a day (QID) | INTRAMUSCULAR | Status: DC | PRN

## 2015-11-11 MED ORDER — APIXABAN 2.5 MG PO TABS
2.5000 mg | ORAL_TABLET | Freq: Two times a day (BID) | ORAL | Status: DC
  Administered 2015-11-11 – 2015-11-13 (×4): 2.5 mg via ORAL
  Filled 2015-11-11 (×4): qty 1

## 2015-11-11 MED ORDER — ACETAMINOPHEN 325 MG PO TABS
650.0000 mg | ORAL_TABLET | Freq: Four times a day (QID) | ORAL | Status: DC | PRN
Start: 2015-11-11 — End: 2015-11-14

## 2015-11-11 MED ORDER — MORPHINE SULFATE (PF) 2 MG/ML IV SOLN: 1.0000 mg | INTRAVENOUS | Status: DC | PRN

## 2015-11-11 MED ORDER — ALUM & MAG HYDROXIDE-SIMETH 200-200-20 MG/5ML PO SUSP: 30.0000 mL | Freq: Four times a day (QID) | ORAL | Status: DC | PRN

## 2015-11-11 NOTE — H&P (Signed)
[facility] History and Physical  Courtney Bradford [id number] DOB: 07-11-1931 DOA: 11/11/2015  Referring physician: Lita Mains PCP: Maggie Font, MD   Chief Complaint: generalized weakness  HPI: Courtney Bradford is a very pleasant 82 y.o. female past medical history of A. fib on anticoagulation, hypertension, gout, diabetes presents to the emergency department chief complaint of generalized weakness persistent and worsening as well as right wrist and hand pain. Initial evaluation in the emergency department reveals urinary tract infection, acute on chronic kidney disease, mild hypercalcemia labs consistent with dehydration.  Information is obtained from the patient who indicates over the last several weeks she has felt gradual worsening of generalized weakness. She continues to ambulate with her walker and is fairly independent with ADLs. Son reports finding her this morning on the toilet and she reported having been sitting there most of the night. He was unable to stand due to weakness. He transported her to the emergency department. She denies chest pain palpitations headache dizziness syncope or near-syncope. She denies any fever chills, cough recent travel or sick contacts. She denies dysuria hematuria frequency or urgency. She denies abdominal pain nausea vomiting diarrhea by red blood per rectum or melena. She states the pain in her right hand is much improved. Symptoms came on gradually have worsened nothing makes better or worse  In the emergency department next have 99.1 she is hemodynamically stable and not hypoxic. She is provided with 1 g of Rocephin 0.6 mg colchicine and 500 mils of normal saline.  Review of Systems:  10 point review of systems complete and all systems are negative except as indicated in the history of present illness   Past Medical History  Diagnosis Date  . Hypertension   . Diabetes mellitus   . Anemia   . Cataract   . Headache(784.0)   . Arthritis       right knee  . A-fib ([place])   . Gout    Past Surgical History  Procedure Laterality Date  . Abdominal hysterectomy    . Breast surgery      breast biopsy  . Anterior and posterior repair  05/17/2011    Procedure: ANTERIOR (CYSTOCELE) AND POSTERIOR REPAIR (RECTOCELE);  Surgeon: Eli Hose, MD;  Location: [facility];  Service: Gynecology;  Laterality: N/A;  Anterior repair with TVT bladder sling and cystoscopy  . Bladder suspension  05/17/2011    Procedure: TRANSVAGINAL TAPE (TVT) PROCEDURE;  Surgeon: Eli Hose, MD;  Location: [facility];  Service: Gynecology;  Laterality: N/A;   Social History:  reports that she has never smoked. She does not have any smokeless tobacco history on file. She reports that she does not drink alcohol or use illicit drugs. She lives alone she uses a walker for ambulation she does her own light cleaning family does cooking in provide transportation. Her son nursing administrator Allergies  Allergen Reactions  . Lactose Intolerance (Gi)     Upset stomach    No family history on file. family medical history reviewed and noncontributory to the admission of this elderly lady  Prior to Admission medications   Medication Sig Start Date End Date Taking? Authorizing Provider  aliskiren (TEKTURNA) 150 MG tablet Take 150 mg by mouth daily.    Yes Historical Provider, MD  amLODipine (NORVASC) 5 MG tablet Take 5 mg by mouth daily.     Yes Historical Provider, MD  apixaban (ELIQUIS) 5 MG TABS tablet Take 1 tablet (5 mg total) by mouth 2 (two) times daily.  07/31/14  Yes Hosie Poisson, MD  ferrous fumarate (HEMOCYTE - 106 MG FE) 325 (106 FE) MG TABS tablet Take 1 tablet by mouth 2 (two) times daily.   Yes Historical Provider, MD  insulin regular (HUMULIN R) 100 units/mL injection Inject 10 Units into the skin every morning.     Yes Historical Provider, MD  metoprolol tartrate (LOPRESSOR) 12.5 mg TABS tablet Take 0.5 tablets (12.5 mg total) by mouth daily. 07/31/14  Yes  Hosie Poisson, MD  Olopatadine HCl 0.2 % SOLN Place 1 drop into both eyes daily as needed (watery eyes).    Yes Historical Provider, MD  predniSONE (DELTASONE) 20 MG tablet Prednisone 20 mg daily for 1 day followed by  Prednisone 10 mg daily for 2 days and stop. 07/31/14  Yes Hosie Poisson, MD  cloNIDine (CATAPRES - DOSED IN MG/24 HR) 0.3 mg/24hr patch Place 0.3 mg onto the skin once a week.    Historical Provider, MD   Physical Exam: Filed Vitals:   11/11/15 0912 11/11/15 1115 11/11/15 1130 11/11/15 1215  BP: 139/76 132/52 125/74 135/57  Pulse: 78 69 69 74  Temp: 99.1 F (37.3 C)     TempSrc: Oral     Resp: 16     SpO2: 96% 100% 100% 98%    Wt Readings from Last 3 Encounters:  07/31/14 69.491 kg (153 lb 3.2 oz)  05/17/11 74.39 kg (164 lb)  05/11/11 74.39 kg (164 lb)    General:  Appears calm and comfortable, cooperative Eyes: PERRL, normal lids, irises & conjunctiva ENT: grossly normal hearing, mucous membranes of her mouth are pink but slightly dry Neck: no LAD, masses or thyromegaly Cardiovascular: Irregularly irregular no m/r/g. No LE edema.  Respiratory: CTA bilaterally, no w/r/r. Normal respiratory effort. Abdomen: soft, ntnd is a bowel sounds no guarding Skin: no rash or induration seen on limited exam Musculoskeletal: grossly normal tone BUE/BLE, right wrist with mild swelling and erythema moderate tenderness to palpation some heat. Other joints without erythema or swelling Psychiatric: grossly normal mood and affect, speech fluent and appropriate Neurologic: grossly non-focal. Speech slow but clear and at 3           Labs on Admission:  Basic Metabolic Panel:  Recent Labs Lab 11/11/15 0934  NA 142  K 3.2*  CL 105  CO2 23  GLUCOSE 127*  BUN 45*  CREATININE 1.68*  CALCIUM 9.3   Liver Function Tests: No results for input(s): AST, ALT, ALKPHOS, BILITOT, PROT, ALBUMIN in the last 168 hours. No results for input(s): LIPASE, AMYLASE in the last 168 hours. No  results for input(s): AMMONIA in the last 168 hours. CBC:  Recent Labs Lab 11/11/15 0934  WBC 10.2  HGB 8.7*  HCT 27.7*  MCV 87.1  PLT 301   Cardiac Enzymes: No results for input(s): CKTOTAL, CKMB, CKMBINDEX, TROPONINI in the last 168 hours.  BNP (last 3 results) No results for input(s): BNP in the last 8760 hours.  ProBNP (last 3 results) No results for input(s): PROBNP in the last 8760 hours.  CBG: No results for input(s): GLUCAP in the last 168 hours.  Radiological Exams on Admission: No results found.  EKG: Independently reviewed. Sinus rhythm  Assessment/Plan Principal Problem:   Acute renal failure superimposed on stage 3 chronic kidney disease (HCC) Active Problems:   ANEMIA   Essential hypertension   Diabetes mellitus type 2, controlled ([place])   UTI (urinary tract infection)   Generalized weakness   UTI (lower urinary tract infection)  Gout   A-fib (HCC)   Hypokalemia  #1. Acute on chronic kidney disease stage III. Likely related to dehydration as a result of decreased oral intake in the setting of urinary tract infection. Creatinine 1.6 on admission which is above her baseline of 1.3.  -Admit to telemetry -Hold nephrotoxins -monitor her urine output -Gentle IV fluids -Rocephin  #2. Urinary tract infection. Per urinalysis. Urine culture pending. In addition she has a max temperature 99.1 but is hemodynamically stable and not hypoxic. WBCs within the limits of normal -Rocephin per pharmacy -Follow urine culture -Gentle IV fluids -Monitor urine output  #3. Hypokalemia. Likely related to above. Mild. -Will replete and recheck -Obtain magnesium level for completeness  #4. Gout. Right wrist. Improving on admission. Patient admits to noncompliance with home medications -Provided with 60 mg of Solu-Medrol in the emergency department -Continue oral prednisone tomorrow daily -Taper plan is discharge  #5. Diabetes. Appears controlled. -We'll obtain a  hemoglobin A1c -Use sliding scale insulin for optimal control.  #6. Hypertension. Unfolded in the emergency department. Home medications include Norvasc Lopressor Catapres. -Continue home meds -Monitor  #7. A. Fib. Rate controlled. On Eliquis. chadvasc2 score 7. -Monitor on telemetry -Continue beta blocker -Continue Eliquis  #8 Anemia. Related to chronic disease. Hemoglobin on admission 8.7 which is close to her baseline. Signs symptoms of active bleeding -Monitor -Stable at baseline  #9. Generalized weakness. Likely related to all of the above. At baseline she ambulates with a walker is minimal assist. Would likely benefit from home health. Of note she has been assigned home health in the past and refused open the door when a cane -Case management for home health referral   Code Status: full DVT Prophylaxis: Family Communication: ron Psychologist, forensic son Disposition Plan: home hopefully 24 hours  Time spent: 58 minutes  [facility]

## 2015-11-11 NOTE — ED Notes (Signed)
Admitting at bedside, family and pt updated on plan of care.

## 2015-11-11 NOTE — Evaluation (Signed)
Physical Therapy Evaluation Patient Details Name: Courtney Bradford MRN: [id number] DOB: Jun 09, 1931 Today's Date: 11/11/2015   History of Present Illness  82 y.o. female past medical history of A. fib on anticoagulation, hypertension, gout, diabetes presents to the emergency department chief complaint of generalized weakness persistent and worsening as well as right wrist and hand pain. Initial evaluation in the emergency department reveals urinary tract infection, acute on chronic kidney disease, mild hypercalcemia labs consistent with dehydration.    Clinical Impression  Pt is an 82 y.o. Female with the above history. PTA pt lived alone but has assistance from family and friends on occasion to run errands and complete household chores. Today pt requires Mod A for all bed mobility and Mod A x 2 for all transfers. Pt limited by fatigue and pain in R arm. Unable to complete ambulation trial today as pt urinated upon standing without awareness. Pt required assistance for all ADLs this session and is well below her baseline level of Mod I. Recommending SNF upon discharge although pt adamant about returning home with help. At this time pt is not safe to return home without 24/7 supervision and assistance.     Follow Up Recommendations SNF    Equipment Recommendations  None recommended by PT    Recommendations for Other Services Rehab consult     Precautions / Restrictions Precautions Precautions: Fall Precaution Comments: Unable to use RW because of R arm gout Restrictions Weight Bearing Restrictions: No      Mobility  Bed Mobility Overal bed mobility: Needs Assistance Bed Mobility: Supine to Sit;Rolling Rolling: Min assist   Supine to sit: Mod assist;HOB elevated     General bed mobility comments: Requring Min/Mod A + VC's to use bed rails and get trunk upright. Pt was able to swing legs to EOB with increased time allowed  Transfers Overall transfer level: Needs assistance    Transfers: Sit to/from Stand;Stand Pivot Transfers Sit to Stand: Mod assist;+2 physical assistance Stand pivot transfers: Mod assist;+2 physical assistance       General transfer comment: Requring Mod A x 2 for all transfers + VC's for hand/feet placement and wt shifting cues. Pt able to initiate movement but required assistance throughout  Ambulation/Gait                Stairs            Wheelchair Mobility    Modified Rankin (Stroke Patients Only)       Balance Overall balance assessment: Needs assistance Sitting-balance support: Feet unsupported;Single extremity supported Sitting balance-Leahy Scale: Poor       Standing balance-Leahy Scale: Poor                               Pertinent Vitals/Pain Pain Assessment: No/denies pain (denies pain but guarding R arm heavily through out session) Pain Location: R arm Pain Intervention(s): Limited activity within patient's tolerance    Home Living Family/patient expects to be discharged to:: Private residence Living Arrangements: Alone   Type of Home: [place]: One level Home Equipment: Walker - 2 wheels;Cane - single point Additional Comments: uses cane at home vs RW    Prior Function Level of Independence: Independent with assistive device(s)               Hand Dominance   Dominant Hand: Right    Extremity/Trunk Assessment   Upper Extremity Assessment:  Generalized weakness;RUE deficits/detail RUE Deficits / Details: Gout limiting ROM, strength and use of total arm         Lower Extremity Assessment: Generalized weakness         Communication   Communication: No difficulties  Cognition Arousal/Alertness: Awake/alert Behavior During Therapy: WFL for tasks assessed/performed Overall Cognitive Status: Within Functional Limits for tasks assessed                      General Comments General comments (skin integrity, edema, etc.): Pt unaware  she needed to urinated and upon standing began urinating. Transferred to [facility] w/ Squatt Pivot and required assistance for bathroom ADLs    Exercises        Assessment/Plan    PT Assessment Patient needs continued PT services  PT Diagnosis Difficulty walking;Generalized weakness;Acute pain   PT Problem List Decreased strength;Decreased range of motion;Decreased activity tolerance;Decreased balance;Decreased mobility;Decreased coordination;Decreased knowledge of use of DME;Pain  PT Treatment Interventions DME instruction;Gait training;Functional mobility training;Therapeutic activities;Therapeutic exercise;Balance training;Neuromuscular re-education;Patient/family education   PT Goals (Current goals can be found in the Care Plan section) Acute Rehab PT Goals Patient Stated Goal: go home PT Goal Formulation: With patient Time For Goal Achievement: 11/25/15 Potential to Achieve Goals: Fair    Frequency Min 3X/week   Barriers to discharge Inaccessible home environment;Decreased caregiver support Pt lives alone and is below her baseline level of function    Co-evaluation               End of Session Equipment Utilized During Treatment: Gait belt Activity Tolerance: Patient limited by fatigue Patient left: in chair;with call bell/phone within reach Nurse Communication: Mobility status    Functional Assessment Tool Used: clinical observation Functional Limitation: Mobility: Walking and moving around Mobility: Walking and Moving Around Current Status [id number]): At least 40 percent but less than 60 percent impaired, limited or restricted Mobility: Walking and Moving Around Goal Status [phone]): At least 1 percent but less than 20 percent impaired, limited or restricted    Time: [id number] PT Time Calculation (min) (ACUTE ONLY): 33 min   Charges:   PT Evaluation $PT Eval Moderate Complexity: 1 Procedure PT Treatments $Therapeutic Activity: 8-22 mins   PT G Codes:   PT G-Codes  **NOT FOR INPATIENT CLASS** Functional Assessment Tool Used: clinical observation Functional Limitation: Mobility: Walking and moving around Mobility: Walking and Moving Around Current Status [id number]): At least 40 percent but less than 60 percent impaired, limited or restricted Mobility: Walking and Moving Around Goal Status [phone]): At least 1 percent but less than 20 percent impaired, limited or restricted    Ara Kussmaul 11/11/2015, 5:16 PM  Ara Kussmaul, Student Physical Therapist Acute Rehab [phone]

## 2015-11-11 NOTE — ED Provider Notes (Signed)
CSN: [id number]     Arrival date & time 11/11/15  0900 History   First MD Initiated Contact with Patient 11/11/15 0941     Chief Complaint  Patient presents with  . Weakness  . Gout     (Consider location/radiation/quality/duration/timing/severity/associated sxs/prior Treatment) HPI Patient is brought in by her son who is a Marine scientist here at [employer]. His mother lives alone. States she's had increasing difficulty ambulating and had generalized weakness that is ongoing for several weeks worsening over the last few days. She's had recent flareup of her gout especially on her right upper extremity. Over the last 4 days she's had increasing redness, warmth and swelling to her right wrist and hand. She's not been taking her culture seen which is prescribed when necessary. Son reports that the swelling and redness is better this morning that was last night. He's concerned because he arrived yesterday evening to check on her and she been sitting on the toilet for several hours. She was unable to stand due to weakness. Patient states she only has pain in her right wrist. She denies any chest pain or shortness of breath. She's had no recent fever. Denies any nausea, vomiting or diarrhea. No abdominal pain. No new lower extremity swelling. Son is requesting case management evaluation for possible nursing home placement or home health. Past Medical History  Diagnosis Date  . Hypertension   . Diabetes mellitus   . Anemia   . Cataract   . Headache(784.0)   . Arthritis     right knee  . A-fib ([place])   . Gout    Past Surgical History  Procedure Laterality Date  . Abdominal hysterectomy    . Breast surgery      breast biopsy  . Anterior and posterior repair  05/17/2011    Procedure: ANTERIOR (CYSTOCELE) AND POSTERIOR REPAIR (RECTOCELE);  Surgeon: Eli Hose, MD;  Location: [facility];  Service: Gynecology;  Laterality: N/A;  Anterior repair with TVT bladder sling and cystoscopy  . Bladder suspension   05/17/2011    Procedure: TRANSVAGINAL TAPE (TVT) PROCEDURE;  Surgeon: Eli Hose, MD;  Location: [facility];  Service: Gynecology;  Laterality: N/A;   No family history on file. Social History  Substance Use Topics  . Smoking status: Never Smoker   . Smokeless tobacco: None  . Alcohol Use: No   OB History    No data available     Review of Systems  Constitutional: Negative for fever and chills.  Respiratory: Negative for cough and shortness of breath.   Cardiovascular: Negative for chest pain, palpitations and leg swelling.  Gastrointestinal: Negative for nausea, vomiting, abdominal pain, diarrhea and constipation.  Genitourinary: Negative for dysuria, frequency and flank pain.  Musculoskeletal: Positive for arthralgias. Negative for back pain and neck pain.  Skin: Positive for color change. Negative for rash and wound.  Neurological: Positive for weakness (generalized). Negative for dizziness, syncope, light-headedness, numbness and headaches.  All other systems reviewed and are negative.     Allergies  Lactose intolerance (gi)  Home Medications   Prior to Admission medications   Medication Sig Start Date End Date Taking? Authorizing Provider  aliskiren (TEKTURNA) 150 MG tablet Take 150 mg by mouth daily.    Yes Historical Provider, MD  amLODipine (NORVASC) 5 MG tablet Take 5 mg by mouth daily.     Yes Historical Provider, MD  apixaban (ELIQUIS) 5 MG TABS tablet Take 1 tablet (5 mg total) by mouth 2 (two) times daily.  07/31/14  Yes Hosie Poisson, MD  ferrous fumarate (HEMOCYTE - 106 MG FE) 325 (106 FE) MG TABS tablet Take 1 tablet by mouth 2 (two) times daily.   Yes Historical Provider, MD  insulin regular (HUMULIN R) 100 units/mL injection Inject 10 Units into the skin every morning.     Yes Historical Provider, MD  metoprolol tartrate (LOPRESSOR) 12.5 mg TABS tablet Take 0.5 tablets (12.5 mg total) by mouth daily. 07/31/14  Yes Hosie Poisson, MD  Olopatadine HCl 0.2 % SOLN  Place 1 drop into both eyes daily as needed (watery eyes).    Yes Historical Provider, MD  predniSONE (DELTASONE) 20 MG tablet Prednisone 20 mg daily for 1 day followed by  Prednisone 10 mg daily for 2 days and stop. 07/31/14  Yes Hosie Poisson, MD  cloNIDine (CATAPRES - DOSED IN MG/24 HR) 0.3 mg/24hr patch Place 0.3 mg onto the skin once a week.    Historical Provider, MD   BP 124/57 mmHg  Pulse 73  Temp(Src) 98.9 F (37.2 C) (Oral)  Resp 16  SpO2 99% Physical Exam  Constitutional: She is oriented to person, place, and time. She appears well-developed and well-nourished. No distress.  HENT:  Head: Normocephalic and atraumatic.  Mouth/Throat: Oropharynx is clear and moist. No oropharyngeal exudate.  Eyes: EOM are normal. Pupils are equal, round, and reactive to light.  Neck: Normal range of motion. Neck supple. No JVD present.  Cardiovascular: Normal rate and regular rhythm.  Exam reveals no gallop and no friction rub.   No murmur heard. Pulmonary/Chest: Effort normal and breath sounds normal. No respiratory distress. She has no wheezes. She has no rales. She exhibits no tenderness.  Abdominal: Soft. Bowel sounds are normal. She exhibits no distension and no mass. There is no tenderness. There is no rebound and no guarding.  Musculoskeletal: Normal range of motion. She exhibits edema and tenderness.  Patient has right wrist warmth, swelling and tenderness with light touch. Distal cap refill is brisk. No evidence of trauma.  Neurological: She is alert and oriented to person, place, and time.  Moving all extremities without focal deficit. Right upper extremity neurological examination is limited due to pain. Sensation appears to be intact. Patient is alert and oriented.  Skin: Skin is warm and dry. No rash noted. No erythema.  Psychiatric: She has a normal mood and affect. Her behavior is normal.  Nursing note and vitals reviewed.   ED Course  Procedures (including critical care  time) Labs Review Labs Reviewed  BASIC METABOLIC PANEL - Abnormal; Notable for the following:    Potassium 3.2 (*)    Glucose, Bld 127 (*)    BUN 45 (*)    Creatinine, Ser 1.68 (*)    GFR calc non Af Amer 27 (*)    GFR calc Af Amer 32 (*)    All other components within normal limits  CBC - Abnormal; Notable for the following:    RBC 3.18 (*)    Hemoglobin 8.7 (*)    HCT 27.7 (*)    All other components within normal limits  URINALYSIS, ROUTINE W REFLEX MICROSCOPIC (NOT AT [facility]) - Abnormal; Notable for the following:    Color, Urine AMBER (*)    APPearance CLOUDY (*)    Hgb urine dipstick SMALL (*)    Leukocytes, UA SMALL (*)    All other components within normal limits  URINE MICROSCOPIC-ADD ON - Abnormal; Notable for the following:    Squamous Epithelial / LPF 0-5 (*)  Bacteria, UA MANY (*)    All other components within normal limits  CBG MONITORING, ED  I-STAT TROPOININ, ED    Imaging Review No results found. I have personally reviewed and evaluated these images and lab results as part of my medical decision-making.   EKG Interpretation   Date/Time:  Thursday November 11 2015 09:34:22 EST Ventricular Rate:  84 PR Interval:    QRS Duration: 78 QT Interval:  364 QTC Calculation: 430 R Axis:   -3 Text Interpretation:  Atrial fibrillation Nonspecific ST and T wave  abnormality Abnormal ECG Confirmed by Deshawnda Acrey  MD, Shervon Kerwin ([id number]) on  11/11/2015 1:41:03 PM      MDM   Final diagnoses:  Generalized weakness  UTI (lower urinary tract infection)  Renal insufficiency   Patient with evidence of urinary tract infection and mild elevation in creatinine possibly related to dehydration. Discussed with hospitalist and will admit to telemetry bed due to a true fibrillation and episodic bradycardia per son. Started on IV Rocephin and given colchicine in the emergency department     Julianne Rice, MD 11/11/15 1343

## 2015-11-11 NOTE — ED Notes (Signed)
Patient son states that patient started having increased weakness yesterday afternoon.   Patient states that patient was disoriented yesterday afternoon and was too weak to get off the commode.  It is estimated that patient was sitting on toilet for several hours yesterday and unable to put weight on knees/legs.   Patient normally walks with walker/cane, unable to use at this time.  Patient also has gout flare to R hand per son report that has been giving her problems for 2 weeks.   Patient is alert and oriented x 4 at this time.

## 2015-11-12 DIAGNOSIS — E118 Type 2 diabetes mellitus with unspecified complications: Secondary | ICD-10-CM

## 2015-11-12 DIAGNOSIS — R531 Weakness: Secondary | ICD-10-CM

## 2015-11-12 DIAGNOSIS — D649 Anemia, unspecified: Secondary | ICD-10-CM

## 2015-11-12 DIAGNOSIS — N179 Acute kidney failure, unspecified: Secondary | ICD-10-CM | POA: Diagnosis not present

## 2015-11-12 DIAGNOSIS — I10 Essential (primary) hypertension: Secondary | ICD-10-CM

## 2015-11-12 DIAGNOSIS — I482 Chronic atrial fibrillation: Secondary | ICD-10-CM | POA: Diagnosis not present

## 2015-11-12 DIAGNOSIS — E876 Hypokalemia: Secondary | ICD-10-CM

## 2015-11-12 LAB — CBC
HEMATOCRIT: 25.3 % — AB (ref 36.0–46.0)
Hemoglobin: 8 g/dL — ABNORMAL LOW (ref 12.0–15.0)
MCH: 27.7 pg (ref 26.0–34.0)
MCHC: 31.6 g/dL (ref 30.0–36.0)
MCV: 87.5 fL (ref 78.0–100.0)
PLATELETS: 305 10*3/uL (ref 150–400)
RBC: 2.89 MIL/uL — ABNORMAL LOW (ref 3.87–5.11)
RDW: 13.3 % (ref 11.5–15.5)
WBC: 9.6 10*3/uL (ref 4.0–10.5)

## 2015-11-12 LAB — BASIC METABOLIC PANEL
Anion gap: 9 (ref 5–15)
BUN: 53 mg/dL — AB (ref 6–20)
CHLORIDE: 108 mmol/L (ref 101–111)
CO2: 23 mmol/L (ref 22–32)
CREATININE: 1.7 mg/dL — AB (ref 0.44–1.00)
Calcium: 8.7 mg/dL — ABNORMAL LOW (ref 8.9–10.3)
GFR calc Af Amer: 31 mL/min — ABNORMAL LOW (ref >60)
GFR calc non Af Amer: 27 mL/min — ABNORMAL LOW (ref >60)
GLUCOSE: 284 mg/dL — AB (ref 65–99)
Potassium: 4.2 mmol/L (ref 3.5–5.1)
SODIUM: 140 mmol/L (ref 135–145)

## 2015-11-12 LAB — HEMOGLOBIN A1C
Hgb A1c MFr Bld: 5.3 % (ref 4.8–5.6)
Mean Plasma Glucose: 105 mg/dL

## 2015-11-12 LAB — GLUCOSE, CAPILLARY
GLUCOSE-CAPILLARY: 211 mg/dL — AB (ref 65–99)
Glucose-Capillary: 242 mg/dL — ABNORMAL HIGH (ref 65–99)
Glucose-Capillary: 263 mg/dL — ABNORMAL HIGH (ref 65–99)
Glucose-Capillary: 222 mg/dL — ABNORMAL HIGH (ref 65–99)

## 2015-11-12 LAB — MAGNESIUM: Magnesium: 2 mg/dL (ref 1.7–2.4)

## 2015-11-12 MED ORDER — PREDNISONE 20 MG PO TABS
40.0000 mg | ORAL_TABLET | Freq: Every day | ORAL | Status: DC
  Administered 2015-11-13 – 2015-11-14 (×2): 40 mg via ORAL
  Filled 2015-11-12 (×2): qty 2

## 2015-11-12 MED ORDER — SODIUM CHLORIDE 0.9 % IV SOLN
INTRAVENOUS | Status: DC
  Administered 2015-11-12 – 2015-11-13 (×2): via INTRAVENOUS

## 2015-11-12 NOTE — Progress Notes (Signed)
[facility]                                                                              Patient Demographics  Courtney Bradford, is a 82 y.o. female, DOB - 07/29/1931, [id number]  Admit date - 11/11/2015   Admitting Physician Albertine Patricia, MD  Outpatient Primary MD for the patient is Maggie Font, MD  LOS -    Chief Complaint  Patient presents with  . Weakness  . Gout      HPI on 11/11/2015 by Ms. Dyanne Carrel, NP Courtney Bradford is a very pleasant 82 y.o. female past medical history of A. fib on anticoagulation, hypertension, gout, diabetes presents to the emergency department chief complaint of generalized weakness persistent and worsening as well as right wrist and hand pain. Initial evaluation in the emergency department reveals urinary tract infection, acute on chronic kidney disease, mild hypercalcemia labs consistent with dehydration.  Information is obtained from the patient who indicates over the last several weeks she has felt gradual worsening of generalized weakness. She continues to ambulate with her walker and is fairly independent with ADLs. Son reports finding her this morning on the toilet and she reported having been sitting there most of the night. He was unable to stand due to weakness. He transported her to the emergency department. She denies chest pain palpitations headache dizziness syncope or near-syncope. She denies any fever chills, cough recent travel or sick contacts. She denies dysuria hematuria frequency or urgency. She denies abdominal pain nausea vomiting diarrhea by red blood per rectum or melena. She states the pain in her right hand is much improved. Symptoms came on gradually have worsened nothing makes better or worse  In the emergency department next have 99.1 she is hemodynamically stable and not hypoxic. She is provided with 1 g of Rocephin 0.6 mg colchicine and 500 mils of normal saline.   Assessment & Plan   Acute on chronic kidney  disease, stage III -Likely secondary to dehydration, decreased oral intake versus UTI versus progression of CKD -Creatinine 1.7, baseline 1.3 (in 2015) -Continue IV fluids and monitor BMP  Urinary tract infection -Urine culture pending -Continue Rocephin  Hypokalemia -Resolved, continue to monitor BMP  Gout -Of the right wrist, improving -Patient admits to noncompliance with home medications -Continue prednisone  Diabetes mellitus, type II -Continue insulin sliding scale CBG monitoring -Hemoglobin A1c  Hypertension -Continue amlodipine, metoprolol,  Atrial fibrillation -CHADSVASC 7 -Continue metoprolol, Eliquis  Anemia secondary to chronic disease -Hemoglobin 8, baseline 9  -Drop likely dilutional  -Continue to monitor CBC  Generalized weakness/physical deconditioning -PT consult is recommended SNF -Patient refuses skilled nursing facility. -When medically stable, will discharge patient with home health services  Code Status: Full  Family Communication: Son at bedside  Disposition Plan: Admitted  Time Spent in minutes   30 minutes  Procedures  None  Consults   None  DVT Prophylaxis  Eliquis  Lab Results  Component Value Date   PLT 305 11/12/2015    Medications  Scheduled Meds: . aliskiren  150 mg Oral Daily  . amLODipine  5 mg Oral Daily  . apixaban  2.5 mg Oral BID  .  cefTRIAXone (ROCEPHIN)  IV  1 g Intravenous Q24H  . cloNIDine  0.3 mg Transdermal Weekly  . colchicine  0.6 mg Oral Daily  . ferrous fumarate  1 tablet Oral BID  . insulin aspart  0-15 Units Subcutaneous TID WC  . insulin aspart  0-5 Units Subcutaneous QHS  . metoprolol tartrate  12.5 mg Oral Daily  . predniSONE  40 mg Oral Q breakfast  . sodium chloride  500 mL Intravenous Once   Continuous Infusions:  PRN Meds:.acetaminophen **OR** acetaminophen, alum & mag hydroxide-simeth, HYDROcodone-acetaminophen, morphine injection, ondansetron **OR** ondansetron (ZOFRAN) IV,  polyethylene glycol, traZODone  Antibiotics    Anti-infectives    Start     Dose/Rate Route Frequency Ordered Stop   11/11/15 1430  cefTRIAXone (ROCEPHIN) 1 g in dextrose 5 % 50 mL IVPB     1 g 100 mL/hr over 30 Minutes Intravenous Every 24 hours 11/11/15 1421     11/11/15 1230  cefTRIAXone (ROCEPHIN) 1 g in dextrose 5 % 50 mL IVPB  Status:  Discontinued     1 g 100 mL/hr over 30 Minutes Intravenous  Once 11/11/15 1226 11/11/15 1421      Subjective:   Courtney Bradford seen and examined today.  Patient states she's feeling better today. She is ready to go home. She does not want to go to rehabilitation. Patient denies any chest pain, shortness of breath, abdominal pain, dizziness or headache.  Objective:   Filed Vitals:   11/11/15 2203 11/12/15 0430 11/12/15 1023 11/12/15 1147  BP: 127/51 132/54 134/59 131/55  Pulse: 83 68  66  Temp: 99 F (37.2 C) 98.4 F (36.9 C)  98.2 F (36.8 C)  TempSrc: Oral Oral  Oral  Resp: 18 18  16   Height:      Weight:  72.8 kg (160 lb 7.9 oz)    SpO2: 100% 98%  100%    Wt Readings from Last 3 Encounters:  11/12/15 72.8 kg (160 lb 7.9 oz)  07/31/14 69.491 kg (153 lb 3.2 oz)  05/17/11 74.39 kg (164 lb)     Intake/Output Summary (Last 24 hours) at 11/12/15 1406 Last data filed at 11/12/15 1401  Gross per 24 hour  Intake    900 ml  Output    650 ml  Net    250 ml    Exam  General: Well developed, well nourished, NAD, appears stated age  HEENT: NCAT,  mucous membranes moist.   Cardiovascular: S1 S2 auscultated, no rubs, murmurs or gallops. IRR  Respiratory: Clear to auscultation bilaterally  Abdomen: Soft, nontender, nondistended, + bowel sounds  Extremities: warm dry without cyanosis clubbing or edema, mild swelling right wrist  Neuro: AAOx3, nonfocal  Psych: Normal affect and demeanor with intact judgement and insight  Data Review   Micro Results Recent Results (from the past 240 hour(s))  Urine culture     Status: None  (Preliminary result)   Collection Time: 11/11/15  4:23 PM  Result Value Ref Range Status   Specimen Description URINE, CLEAN CATCH  Final   Special Requests NONE  Final   Culture TOO YOUNG TO READ  Final   Report Status PENDING  Incomplete    Radiology Reports No results found.  CBC  Recent Labs Lab 11/11/15 0934 11/12/15 0325  WBC 10.2 9.6  HGB 8.7* 8.0*  HCT 27.7* 25.3*  PLT 301 305  MCV 87.1 87.5  MCH 27.4 27.7  MCHC 31.4 31.6  RDW 13.2 13.3    Chemistries  Recent Labs Lab 11/11/15 0934 11/11/15 1639 11/12/15 0325  NA 142  --  140  K 3.2*  --  4.2  CL 105  --  108  CO2 23  --  23  GLUCOSE 127*  --  284*  BUN 45*  --  53*  CREATININE 1.68*  --  1.70*  CALCIUM 9.3  --  8.7*  MG  --  1.5* 2.0   ------------------------------------------------------------------------------------------------------------------ estimated creatinine clearance is 24.4 mL/min (by C-G formula based on Cr of 1.7). ------------------------------------------------------------------------------------------------------------------  Recent Labs  11/11/15 1639  HGBA1C 5.3   ------------------------------------------------------------------------------------------------------------------ No results for input(s): CHOL, HDL, LDLCALC, TRIG, CHOLHDL, LDLDIRECT in the last 72 hours. ------------------------------------------------------------------------------------------------------------------ No results for input(s): TSH, T4TOTAL, T3FREE, THYROIDAB in the last 72 hours.  Invalid input(s): FREET3 ------------------------------------------------------------------------------------------------------------------ No results for input(s): VITAMINB12, FOLATE, FERRITIN, TIBC, IRON, RETICCTPCT in the last 72 hours.  Coagulation profile No results for input(s): INR, PROTIME in the last 168 hours.  No results for input(s): DDIMER in the last 72 hours.  Cardiac Enzymes No results for input(s):  CKMB, TROPONINI, MYOGLOBIN in the last 168 hours.  Invalid input(s): CK ------------------------------------------------------------------------------------------------------------------ Invalid input(s): POCBNP    Cristalle Rohm D.O. on 11/12/2015 at 2:06 PM  Between 7am to 7pm - Pager - [phone]  After 7pm go to [email or web address] - password [email or web address]  And look for the night coverage person covering for me after hours  [facility] Office  [phone]

## 2015-11-12 NOTE — Progress Notes (Signed)
Results for MELITZA, LISIECKI (MRN [id number]) as of 11/12/2015 14:22  Ref. Range 11/11/2015 16:43 11/11/2015 22:32 11/12/2015 06:13 11/12/2015 11:45  Glucose-Capillary Latest Ref Range: 65-99 mg/dL 124 (H) 306 (H) 242 (H) 263 (H)  Noted that blood sugars continue to be elevated. Recommend adding Novolog 3-4 units TID with meals and while on steroids if CBGs continue to be greater than 180 mg/dl. Continue Novolog MODERATE correction scale TID & HS. Harvel Ricks RN BSN CDE

## 2015-11-12 NOTE — Care Management Note (Signed)
Case Management Note  Patient Details  Name: Courtney Bradford MRN: [id number] Date of Birth: 1930-12-13  Subjective/Objective:   Admitted with Acute Renal Failure                 Action/Plan: Patient lives alone, has Life Alert ( electronic device to call for help) but does not wear it. Her son is a Therapist, sports, very support of her. Patient is refusing at this time SNF placement but is agreeable to [facility] services, choice offered, pt/son chose [place] with [facility] called for arrangements. 3:1 ordered and to be delivered to the room today. She has a walker, wheelchair and cane at home.  Expected Discharge Date:   possibly 11/12/2015               Expected Discharge Plan:  [name]  Discharge planning Services  CM Consult    Choice offered to:  Adult Children  DME Arranged:  3-N-1 DME Agency:  [place] Arranged:  RN, PT, Nurse's Aide [place] Agency:  [place]  Status of Service:  In process, will continue to follow  Sherrilyn Rist [id number] 11/12/2015, 11:43 AM

## 2015-11-12 NOTE — Progress Notes (Signed)
Pt is not agreeable to SNF- states that her current leg weakness is recurrent during the winter time and that she has always managed at home in the past.  States she has consistent help from nieces and from several sons even though she lives alone.  Pt is agreeable to home health services if appropriate- CSW informed RNCM  CSW signing off  Domenica Reamer, [place] Worker [phone]

## 2015-11-13 DIAGNOSIS — N179 Acute kidney failure, unspecified: Secondary | ICD-10-CM | POA: Diagnosis not present

## 2015-11-13 DIAGNOSIS — R531 Weakness: Secondary | ICD-10-CM | POA: Diagnosis not present

## 2015-11-13 DIAGNOSIS — D649 Anemia, unspecified: Secondary | ICD-10-CM | POA: Diagnosis not present

## 2015-11-13 DIAGNOSIS — I482 Chronic atrial fibrillation: Secondary | ICD-10-CM | POA: Diagnosis not present

## 2015-11-13 LAB — GLUCOSE, CAPILLARY
GLUCOSE-CAPILLARY: 135 mg/dL — AB (ref 65–99)
GLUCOSE-CAPILLARY: 115 mg/dL — AB (ref 65–99)
Glucose-Capillary: 244 mg/dL — ABNORMAL HIGH (ref 65–99)
Glucose-Capillary: 183 mg/dL — ABNORMAL HIGH (ref 65–99)

## 2015-11-13 LAB — CBC
HEMATOCRIT: 24.1 % — AB (ref 36.0–46.0)
Hemoglobin: 7.8 g/dL — ABNORMAL LOW (ref 12.0–15.0)
MCH: 28.6 pg (ref 26.0–34.0)
MCHC: 32.4 g/dL (ref 30.0–36.0)
MCV: 88.3 fL (ref 78.0–100.0)
Platelets: 324 10*3/uL (ref 150–400)
RBC: 2.73 MIL/uL — ABNORMAL LOW (ref 3.87–5.11)
RDW: 13.3 % (ref 11.5–15.5)
WBC: 13.5 10*3/uL — ABNORMAL HIGH (ref 4.0–10.5)

## 2015-11-13 LAB — URINE CULTURE

## 2015-11-13 LAB — BASIC METABOLIC PANEL
Anion gap: 8 (ref 5–15)
BUN: 61 mg/dL — ABNORMAL HIGH (ref 6–20)
CO2: 22 mmol/L (ref 22–32)
Calcium: 9 mg/dL (ref 8.9–10.3)
Chloride: 112 mmol/L — ABNORMAL HIGH (ref 101–111)
Creatinine, Ser: 1.39 mg/dL — ABNORMAL HIGH (ref 0.44–1.00)
GFR calc non Af Amer: 34 mL/min — ABNORMAL LOW (ref >60)
GFR, EST AFRICAN AMERICAN: 40 mL/min — AB (ref >60)
Glucose, Bld: 158 mg/dL — ABNORMAL HIGH (ref 65–99)
Potassium: 4.2 mmol/L (ref 3.5–5.1)
Sodium: 142 mmol/L (ref 135–145)

## 2015-11-13 MED ORDER — APIXABAN 5 MG PO TABS
5.0000 mg | ORAL_TABLET | Freq: Two times a day (BID) | ORAL | Status: DC
  Administered 2015-11-13 – 2015-11-14 (×2): 5 mg via ORAL
  Filled 2015-11-13 (×2): qty 1

## 2015-11-13 MED ORDER — INSULIN ASPART 100 UNIT/ML ~~LOC~~ SOLN
3.0000 [IU] | Freq: Three times a day (TID) | SUBCUTANEOUS | Status: DC
  Administered 2015-11-13: 3 [IU] via SUBCUTANEOUS

## 2015-11-13 NOTE — Progress Notes (Signed)
Pt ambulated in a hallway approx 200 ft, tolerated well, no any sign of distress and SOB noted, Sat maintained to 96%.

## 2015-11-13 NOTE — Progress Notes (Addendum)
[facility]                                                                              Patient Demographics  Courtney Bradford, is a 82 y.o. female, DOB - 1931-04-04, [id number]  Admit date - 11/11/2015   Admitting Physician Courtney Patricia, MD  Outpatient Primary MD for the patient is Courtney Font, MD  LOS -    Chief Complaint  Patient presents with  . Weakness  . Gout      HPI on 11/11/2015 by Ms. Courtney Carrel, NP Courtney Bradford is a very pleasant 82 y.o. female past medical history of A. fib on anticoagulation, hypertension, gout, diabetes presents to the emergency department chief complaint of generalized weakness persistent and worsening as well as right wrist and hand pain. Initial evaluation in the emergency department reveals urinary tract infection, acute on chronic kidney disease, mild hypercalcemia labs consistent with dehydration.  Information is obtained from the patient who indicates over the last several weeks she has felt gradual worsening of generalized weakness. She continues to ambulate with her walker and is fairly independent with ADLs. Son reports finding her this morning on the toilet and she reported having been sitting there most of the night. He was unable to stand due to weakness. He transported her to the emergency department. She denies chest pain palpitations headache dizziness syncope or near-syncope. She denies any fever chills, cough recent travel or sick contacts. She denies dysuria hematuria frequency or urgency. She denies abdominal pain nausea vomiting diarrhea by red blood per rectum or melena. She states the pain in her right hand is much improved. Symptoms came on gradually have worsened nothing makes better or worse  In the emergency department next have 99.1 she is hemodynamically stable and not hypoxic. She is provided with 1 g of Rocephin 0.6 mg colchicine and 500 mils of normal saline.   Assessment & Plan   Acute on chronic kidney  disease, stage III -Likely secondary to dehydration, decreased oral intake versus UTI versus progression of CKD -Creatinine peaked at 1.7, currently back to baseline 1.39 (baseline 1.3 in 2015) -Will continue to monitor BMP  Urinary tract infection -Urine culture pending -Continue Rocephin  Leukocytosis -Likely secondary to steroids vs UTI -Continue to monitor BMP -Currently on antibiotics for UTI  Hypokalemia -Resolved, continue to monitor BMP  Gout -Of the right wrist, improving -Patient admits to noncompliance with home medications -Continue prednisone  Diabetes mellitus, type II complicated by hyperglycemia -Continue insulin sliding scale CBG monitoring -Hemoglobin A1c 5.3 -Patient is on steroids.  Will add on Novolog 3u TID with meals  Hypertension -Continue amlodipine, metoprolol, clonidine  Atrial fibrillation -CHADSVASC 7 -Continue metoprolol, Eliquis  Anemia secondary to chronic disease -Hemoglobin 7.8, baseline 9  -Drop likely dilutional- will discontinue IVF  -Continue to monitor CBC -Continue iron supplemention  Generalized weakness/physical deconditioning -PT consult is recommended SNF -Patient refuses skilled nursing facility. -When medically stable, will discharge patient with home health services  Code Status: Full  Family Communication: None at bedside. Spoke with son via phone.  Disposition Plan: Admitted. Anticipate discharge to home with HHPT/OT on 11/14/15  Time Spent in minutes  30 minutes  Procedures  None  Consults   None  DVT Prophylaxis  Eliquis  Lab Results  Component Value Date   PLT 324 11/13/2015    Medications  Scheduled Meds: . aliskiren  150 mg Oral Daily  . amLODipine  5 mg Oral Daily  . apixaban  2.5 mg Oral BID  . cefTRIAXone (ROCEPHIN)  IV  1 g Intravenous Q24H  . cloNIDine  0.3 mg Transdermal Weekly  . colchicine  0.6 mg Oral Daily  . ferrous fumarate  1 tablet Oral BID  . insulin aspart  0-15 Units  Subcutaneous TID WC  . insulin aspart  0-5 Units Subcutaneous QHS  . insulin aspart  3 Units Subcutaneous TID WC  . metoprolol tartrate  12.5 mg Oral Daily  . predniSONE  40 mg Oral Q breakfast  . sodium chloride  500 mL Intravenous Once   Continuous Infusions:  PRN Meds:.acetaminophen **OR** acetaminophen, alum & mag hydroxide-simeth, HYDROcodone-acetaminophen, morphine injection, ondansetron **OR** ondansetron (ZOFRAN) IV, polyethylene glycol, traZODone  Antibiotics    Anti-infectives    Start     Dose/Rate Route Frequency Ordered Stop   11/11/15 1430  cefTRIAXone (ROCEPHIN) 1 g in dextrose 5 % 50 mL IVPB     1 g 100 mL/hr over 30 Minutes Intravenous Every 24 hours 11/11/15 1421     11/11/15 1230  cefTRIAXone (ROCEPHIN) 1 g in dextrose 5 % 50 mL IVPB  Status:  Discontinued     1 g 100 mL/hr over 30 Minutes Intravenous  Once 11/11/15 1226 11/11/15 1421      Subjective:   Courtney Bradford seen and examined today.  Patient states she's feeling better today and feels her wrist is less swollen. Patient denies any chest pain, shortness of breath, abdominal pain, dizziness or headache.  Objective:   Filed Vitals:   11/12/15 1633 11/12/15 2100 11/13/15 0500 11/13/15 0900  BP: 156/57 124/53 153/50 149/59  Pulse: 84 81 66 66  Temp: 98.4 F (36.9 C) 98 F (36.7 C) 97.7 F (36.5 C) 98.5 F (36.9 C)  TempSrc: Oral Oral Oral Oral  Resp: 16 17 18 18   Height:      Weight:   74.027 kg (163 lb 3.2 oz)   SpO2: 100% 96% 100% 100%    Wt Readings from Last 3 Encounters:  11/13/15 74.027 kg (163 lb 3.2 oz)  07/31/14 69.491 kg (153 lb 3.2 oz)  05/17/11 74.39 kg (164 lb)     Intake/Output Summary (Last 24 hours) at 11/13/15 1103 Last data filed at 11/13/15 0900  Gross per 24 hour  Intake   1700 ml  Output    450 ml  Net   1250 ml    Exam  General: Well developed, well nourished, NAD, appears stated age  58: NCAT,  mucous membranes moist.   Cardiovascular: S1 S2 auscultated,  no murmurs, IRR  Respiratory: Clear to auscultation bilaterally  Abdomen: Soft, nontender, nondistended, + bowel sounds  Extremities: warm dry without cyanosis clubbing or edema, mild edema right wrist  Neuro: AAOx3, nonfocal, able to sit up on the side of the bed without assistance  Psych: Appropriate mood and affect  Data Review   Micro Results Recent Results (from the past 240 hour(s))  Urine culture     Status: None (Preliminary result)   Collection Time: 11/11/15  4:23 PM  Result Value Ref Range Status   Specimen Description URINE, CLEAN CATCH  Final   Special Requests NONE  Final   Culture  TOO YOUNG TO READ  Final   Report Status PENDING  Incomplete    Radiology Reports No results found.  CBC  Recent Labs Lab 11/11/15 0934 11/12/15 0325 11/13/15 0353  WBC 10.2 9.6 13.5*  HGB 8.7* 8.0* 7.8*  HCT 27.7* 25.3* 24.1*  PLT 301 305 324  MCV 87.1 87.5 88.3  MCH 27.4 27.7 28.6  MCHC 31.4 31.6 32.4  RDW 13.2 13.3 13.3    Chemistries   Recent Labs Lab 11/11/15 0934 11/11/15 1639 11/12/15 0325 11/13/15 0353  NA 142  --  140 142  K 3.2*  --  4.2 4.2  CL 105  --  108 112*  CO2 23  --  23 22  GLUCOSE 127*  --  284* 158*  BUN 45*  --  53* 61*  CREATININE 1.68*  --  1.70* 1.39*  CALCIUM 9.3  --  8.7* 9.0  MG  --  1.5* 2.0  --    ------------------------------------------------------------------------------------------------------------------ estimated creatinine clearance is 30 mL/min (by C-G formula based on Cr of 1.39). ------------------------------------------------------------------------------------------------------------------  Recent Labs  11/11/15 1639  HGBA1C 5.3   ------------------------------------------------------------------------------------------------------------------ No results for input(s): CHOL, HDL, LDLCALC, TRIG, CHOLHDL, LDLDIRECT in the last 72  hours. ------------------------------------------------------------------------------------------------------------------ No results for input(s): TSH, T4TOTAL, T3FREE, THYROIDAB in the last 72 hours.  Invalid input(s): FREET3 ------------------------------------------------------------------------------------------------------------------ No results for input(s): VITAMINB12, FOLATE, FERRITIN, TIBC, IRON, RETICCTPCT in the last 72 hours.  Coagulation profile No results for input(s): INR, PROTIME in the last 168 hours.  No results for input(s): DDIMER in the last 72 hours.  Cardiac Enzymes No results for input(s): CKMB, TROPONINI, MYOGLOBIN in the last 168 hours.  Invalid input(s): CK ------------------------------------------------------------------------------------------------------------------ Invalid input(s): POCBNP    Maniah Nading D.O. on 11/13/2015 at 11:03 AM  Between 7am to 7pm - Pager - [phone]  After 7pm go to [email or web address] - password [email or web address]  And look for the night coverage person covering for me after hours  [facility] Office  [phone]

## 2015-11-14 DIAGNOSIS — N179 Acute kidney failure, unspecified: Secondary | ICD-10-CM | POA: Diagnosis not present

## 2015-11-14 DIAGNOSIS — R531 Weakness: Secondary | ICD-10-CM | POA: Diagnosis not present

## 2015-11-14 DIAGNOSIS — D649 Anemia, unspecified: Secondary | ICD-10-CM | POA: Diagnosis not present

## 2015-11-14 DIAGNOSIS — I482 Chronic atrial fibrillation: Secondary | ICD-10-CM | POA: Diagnosis not present

## 2015-11-14 LAB — CBC
HEMATOCRIT: 25.3 % — AB (ref 36.0–46.0)
Hemoglobin: 8 g/dL — ABNORMAL LOW (ref 12.0–15.0)
MCH: 27.7 pg (ref 26.0–34.0)
MCHC: 31.6 g/dL (ref 30.0–36.0)
MCV: 87.5 fL (ref 78.0–100.0)
PLATELETS: 375 10*3/uL (ref 150–400)
RBC: 2.89 MIL/uL — ABNORMAL LOW (ref 3.87–5.11)
RDW: 13.3 % (ref 11.5–15.5)
WBC: 9.5 10*3/uL (ref 4.0–10.5)

## 2015-11-14 LAB — BASIC METABOLIC PANEL
ANION GAP: 6 (ref 5–15)
BUN: 53 mg/dL — AB (ref 6–20)
CALCIUM: 9.4 mg/dL (ref 8.9–10.3)
CO2: 25 mmol/L (ref 22–32)
CREATININE: 1.33 mg/dL — AB (ref 0.44–1.00)
Chloride: 111 mmol/L (ref 101–111)
GFR calc Af Amer: 42 mL/min — ABNORMAL LOW (ref >60)
GFR calc non Af Amer: 36 mL/min — ABNORMAL LOW (ref >60)
GLUCOSE: 160 mg/dL — AB (ref 65–99)
Potassium: 4.7 mmol/L (ref 3.5–5.1)
Sodium: 142 mmol/L (ref 135–145)

## 2015-11-14 LAB — GLUCOSE, CAPILLARY
Glucose-Capillary: 122 mg/dL — ABNORMAL HIGH (ref 65–99)
Glucose-Capillary: 116 mg/dL — ABNORMAL HIGH (ref 65–99)

## 2015-11-14 MED ORDER — INSULIN ASPART 100 UNIT/ML ~~LOC~~ SOLN
3.0000 [IU] | Freq: Three times a day (TID) | SUBCUTANEOUS | Status: DC
  Administered 2015-11-14: 3 [IU] via SUBCUTANEOUS

## 2015-11-14 MED ORDER — CEFUROXIME AXETIL 250 MG PO TABS: 250.0000 mg | ORAL_TABLET | Freq: Two times a day (BID) | ORAL | Status: DC

## 2015-11-14 MED ORDER — COLCHICINE 0.6 MG PO TABS: 0.6000 mg | ORAL_TABLET | Freq: Every day | ORAL | Status: DC

## 2015-11-14 MED ORDER — PREDNISONE 10 MG PO TABS: ORAL_TABLET | ORAL | Status: DC

## 2015-11-14 NOTE — Discharge Summary (Signed)
Physician Discharge Summary  Courtney Bradford [id number] DOB: October 31, 1930 DOA: 11/11/2015  PCP: Maggie Font, MD  Admit date: 11/11/2015 Discharge date: 11/14/2015  Time spent: 45 minutes  Recommendations for Outpatient Follow-up:  Patient will be discharged to home with home health services.  Patient will need to follow up with primary care provider within one week of discharge, repeat CBC and BMP.  Patient should continue medications as prescribed.  Patient should follow a heart healthy/carb modified diet.   Discharge Diagnoses:  Acute on chronic kidney disease, stage III Urinary tract infection Leukocytosis Hypokalemia Gout Diabetes mellitus, type II complicated by hyperglycemia Hypertension Atrial fibrillation Anemia secondary to chronic disease Generalized weakness/physical deconditioning  Discharge Condition: Stable  Diet recommendation: Heart healthy/carb modified  Filed Weights   11/12/15 0430 11/13/15 0500 11/14/15 0432  Weight: 72.8 kg (160 lb 7.9 oz) 74.027 kg (163 lb 3.2 oz) 73.573 kg (162 lb 3.2 oz)    History of present illness:  on 11/11/2015 by Ms. Dyanne Carrel, NP [name] is a very pleasant 82 y.o. female past medical history of A. fib on anticoagulation, hypertension, gout, diabetes presents to the emergency department chief complaint of generalized weakness persistent and worsening as well as right wrist and hand pain. Initial evaluation in the emergency department reveals urinary tract infection, acute on chronic kidney disease, mild hypercalcemia labs consistent with dehydration.  Information is obtained from the patient who indicates over the last several weeks she has felt gradual worsening of generalized weakness. She continues to ambulate with her walker and is fairly independent with ADLs. Son reports finding her this morning on the toilet and she reported having been sitting there most of the night. He was unable to stand due to weakness. He  transported her to the emergency department. She denies chest pain palpitations headache dizziness syncope or near-syncope. She denies any fever chills, cough recent travel or sick contacts. She denies dysuria hematuria frequency or urgency. She denies abdominal pain nausea vomiting diarrhea by red blood per rectum or melena. She states the pain in her right hand is much improved. Symptoms came on gradually have worsened nothing makes better or worse  In the emergency department next have 99.1 she is hemodynamically stable and not hypoxic. She is provided with 1 g of Rocephin 0.6 mg colchicine and 500 mils of normal saline.  Hospital Course:  Acute on chronic kidney disease, stage III -Likely secondary to dehydration, decreased oral intake versus UTI versus progression of CKD -Creatinine peaked at 1.7, currently back to baseline 1.33 (baseline 1.3 in 2015) -Repeat BMP in one week  Urinary tract infection -Urine culture: multiple species present -Initially placed on Rocephin, will discharge with ceftin  Leukocytosis -Resolved, Likely secondary to steroids vs UTI -Continue to monitor BMP -Currently on antibiotics for UTI  Hypokalemia -Resolved, continue to monitor BMP  Gout -Of the right wrist, improving -Patient admits to noncompliance with home medications -Continue prednisone, will send home with taper  -Continue colchicine   Diabetes mellitus, type II complicated by hyperglycemia -Continue insulin sliding scale CBG monitoring -Hemoglobin A1c 5.3 -Patient is on steroids. Added on Novolog 3u TID with meals during hospitalization  Hypertension -Continue amlodipine, metoprolol, clonidine  Atrial fibrillation -CHADSVASC 7 -Continue metoprolol, Eliquis  Anemia secondary to chronic disease -Hemoglobin 8.0, baseline 9  -Drop likely dilutional -Continue iron supplemention -Repeat CBC in one week  Generalized weakness/physical deconditioning -PT consult is recommended  SNF -Patient refuses skilled nursing facility. -When medically stable, will discharge patient with  home health services  Procedures  None   Consults  None  Discharge Exam: Filed Vitals:   11/13/15 2003 11/14/15 0432  BP: 132/67 158/72  Pulse: 66 77  Temp: 98 F (36.7 C) 98.4 F (36.9 C)  Resp: 18 18   Exam  General: Well developed, well nourished, NAD  HEENT: NCAT, mucous membranes moist.   Cardiovascular: S1 S2 auscultated, no murmurs, IRR  Respiratory: Clear to auscultation   Abdomen: Soft, nontender, nondistended, + bowel sounds  Extremities: warm dry without cyanosis clubbing or edema, mild edema right wrist  Neuro: AAOx3, nonfocal  Psych: Appropriate mood and affect, pleasant  Discharge Instructions      Discharge Instructions    Discharge instructions    Complete by:  As directed   Patient will be discharged to home with home health services.  Patient will need to follow up with primary care provider within one week of discharge, repeat CBC and BMP.  Patient should continue medications as prescribed.  Patient should follow a heart healthy/carb modified diet.            Medication List    TAKE these medications        aliskiren 150 MG tablet  Commonly known as:  TEKTURNA  Take 150 mg by mouth daily.     amLODipine 5 MG tablet  Commonly known as:  NORVASC  Take 5 mg by mouth daily.     apixaban 5 MG Tabs tablet  Commonly known as:  ELIQUIS  Take 1 tablet (5 mg total) by mouth 2 (two) times daily.     cefUROXime 250 MG tablet  Commonly known as:  CEFTIN  Take 1 tablet (250 mg total) by mouth 2 (two) times daily with a meal.     cloNIDine 0.3 mg/24hr patch  Commonly known as:  CATAPRES - Dosed in mg/24 hr  Place 0.3 mg onto the skin once a week.     colchicine 0.6 MG tablet  Take 1 tablet (0.6 mg total) by mouth daily.     ferrous fumarate 325 (106 Fe) MG Tabs tablet  Commonly known as:  HEMOCYTE - 106 mg FE  Take 1 tablet by mouth 2  (two) times daily.     HUMULIN R 100 units/mL injection  Generic drug:  insulin regular  Inject 10 Units into the skin every morning.     metoprolol tartrate 12.5 mg Tabs tablet  Commonly known as:  LOPRESSOR  Take 0.5 tablets (12.5 mg total) by mouth daily.     Olopatadine HCl 0.2 % Soln  Place 1 drop into both eyes daily as needed (watery eyes).     predniSONE 10 MG tablet  Commonly known as:  DELTASONE  Take  40mg  (4 tabs) x 2 days, then taper to 30mg  (3 tabs) x 2 days, then 20mg  (2 tabs) x  2days, then 10mg  (1 tab) x 2 days, then OFF.       Allergies  Allergen Reactions  . Lactose Intolerance (Gi)     Upset stomach   Follow-up Information    Follow up with [facility].   Specialty:  Home Health Services   Why:  They will do your home health care at your home   Contact information:   [address] [place] [id number] [phone]       Follow up with [facility], MD. Schedule an appointment as soon as possible for a visit in 1 week.   Specialty:  Family Medicine   Why:  Hospital follow up   Contact information:   [place] [id number] [phone]        The results of significant diagnostics from this hospitalization (including imaging, microbiology, ancillary and laboratory) are listed below for reference.    Significant Diagnostic Studies: No results found.  Microbiology: Recent Results (from the past 240 hour(s))  Urine culture     Status: None   Collection Time: 11/11/15  4:23 PM  Result Value Ref Range Status   Specimen Description URINE, CLEAN CATCH  Final   Special Requests NONE  Final   Culture MULTIPLE SPECIES PRESENT, SUGGEST RECOLLECTION  Final   Report Status 11/13/2015 FINAL  Final     Labs: Basic Metabolic Panel:  Recent Labs Lab 11/11/15 0934 11/11/15 1639 11/12/15 0325 11/13/15 0353 11/14/15 0343  NA 142  --  140 142 142  K 3.2*  --  4.2 4.2 4.7  CL 105  --  108 112* 111  CO2  23  --  23 22 25   GLUCOSE 127*  --  284* 158* 160*  BUN 45*  --  53* 61* 53*  CREATININE 1.68*  --  1.70* 1.39* 1.33*  CALCIUM 9.3  --  8.7* 9.0 9.4  MG  --  1.5* 2.0  --   --    Liver Function Tests: No results for input(s): AST, ALT, ALKPHOS, BILITOT, PROT, ALBUMIN in the last 168 hours. No results for input(s): LIPASE, AMYLASE in the last 168 hours. No results for input(s): AMMONIA in the last 168 hours. CBC:  Recent Labs Lab 11/11/15 0934 11/12/15 0325 11/13/15 0353 11/14/15 0343  WBC 10.2 9.6 13.5* 9.5  HGB 8.7* 8.0* 7.8* 8.0*  HCT 27.7* 25.3* 24.1* 25.3*  MCV 87.1 87.5 88.3 87.5  PLT 301 305 324 375   Cardiac Enzymes: No results for input(s): CKTOTAL, CKMB, CKMBINDEX, TROPONINI in the last 168 hours. BNP: BNP (last 3 results) No results for input(s): BNP in the last 8760 hours.  ProBNP (last 3 results) No results for input(s): PROBNP in the last 8760 hours.  CBG:  Recent Labs Lab 11/13/15 0534 11/13/15 1151 11/13/15 1629 11/13/15 2134 11/14/15 0526  GLUCAP 135* 115* 244* 183* 122*       Signed:  Xachary Hambly  [facility] 11/14/2015, 10:09 AM

## 2015-11-14 NOTE — Progress Notes (Signed)
Pt has orders to be discharged. Discharge instructions given and pt has no additional questions at this time. Medication regimen reviewed and pt educated. Pt verbalized understanding and has no additional questions. Telemetry box removed. IV removed and site in good condition. Pt stable and waiting for transportation. 

## 2015-11-14 NOTE — Discharge Instructions (Signed)
Gout Gout is an inflammatory arthritis caused by a buildup of uric acid crystals in the joints. Uric acid is a chemical that is normally present in the blood. When the level of uric acid in the blood is too high it can form crystals that deposit in your joints and tissues. This causes joint redness, soreness, and swelling (inflammation). Repeat attacks are common. Over time, uric acid crystals can form into masses (tophi) near a joint, destroying bone and causing disfigurement. Gout is treatable and often preventable. CAUSES  The disease begins with elevated levels of uric acid in the blood. Uric acid is produced by your body when it breaks down a naturally found substance called purines. Certain foods you eat, such as meats and fish, contain high amounts of purines. Causes of an elevated uric acid level include:  Being passed down from parent to child (heredity).  Diseases that cause increased uric acid production (such as obesity, psoriasis, and certain cancers).  Excessive alcohol use.  Diet, especially diets rich in meat and seafood.  Medicines, including certain cancer-fighting medicines (chemotherapy), water pills (diuretics), and aspirin.  Chronic kidney disease. The kidneys are no longer able to remove uric acid well.  Problems with metabolism. Conditions strongly associated with gout include:  Obesity.  High blood pressure.  High cholesterol.  Diabetes. Not everyone with elevated uric acid levels gets gout. It is not understood why some people get gout and others do not. Surgery, joint injury, and eating too much of certain foods are some of the factors that can lead to gout attacks. SYMPTOMS   An attack of gout comes on quickly. It causes intense pain with redness, swelling, and warmth in a joint.  Fever can occur.  Often, only one joint is involved. Certain joints are more commonly involved:  Base of the big toe.  Knee.  Ankle.  Wrist.  Finger. Without  treatment, an attack usually goes away in a few days to weeks. Between attacks, you usually will not have symptoms, which is different from many other forms of arthritis. DIAGNOSIS  Your caregiver will suspect gout based on your symptoms and exam. In some cases, tests may be recommended. The tests may include:  Blood tests.  Urine tests.  X-rays.  Joint fluid exam. This exam requires a needle to remove fluid from the joint (arthrocentesis). Using a microscope, gout is confirmed when uric acid crystals are seen in the joint fluid. TREATMENT  There are two phases to gout treatment: treating the sudden onset (acute) attack and preventing attacks (prophylaxis).  Treatment of an Acute Attack.  Medicines are used. These include anti-inflammatory medicines or steroid medicines.  An injection of steroid medicine into the affected joint is sometimes necessary.  The painful joint is rested. Movement can worsen the arthritis.  You may use warm or cold treatments on painful joints, depending which works best for you.  Treatment to Prevent Attacks.  If you suffer from frequent gout attacks, your caregiver may advise preventive medicine. These medicines are started after the acute attack subsides. These medicines either help your kidneys eliminate uric acid from your body or decrease your uric acid production. You may need to stay on these medicines for a very long time.  The early phase of treatment with preventive medicine can be associated with an increase in acute gout attacks. For this reason, during the first few months of treatment, your caregiver may also advise you to take medicines usually used for acute gout treatment. Be sure you  understand your caregiver's directions. Your caregiver may make several adjustments to your medicine dose before these medicines are effective.  Discuss dietary treatment with your caregiver or dietitian. Alcohol and drinks high in sugar and fructose and foods  such as meat, poultry, and seafood can increase uric acid levels. Your caregiver or dietitian can advise you on drinks and foods that should be limited. HOME CARE INSTRUCTIONS   Do not take aspirin to relieve pain. This raises uric acid levels.  Only take over-the-counter or prescription medicines for pain, discomfort, or fever as directed by your caregiver.  Rest the joint as much as possible. When in bed, keep sheets and blankets off painful areas.  Keep the affected joint raised (elevated).  Apply warm or cold treatments to painful joints. Use of warm or cold treatments depends on which works best for you.  Use crutches if the painful joint is in your leg.  Drink enough fluids to keep your urine clear or pale yellow. This helps your body get rid of uric acid. Limit alcohol, sugary drinks, and fructose drinks.  Follow your dietary instructions. Pay careful attention to the amount of protein you eat. Your daily diet should emphasize fruits, vegetables, whole grains, and fat-free or low-fat milk products. Discuss the use of coffee, vitamin C, and cherries with your caregiver or dietitian. These may be helpful in lowering uric acid levels.  Maintain a healthy body weight. SEEK MEDICAL CARE IF:   You develop diarrhea, vomiting, or any side effects from medicines.  You do not feel better in 24 hours, or you are getting worse. SEEK IMMEDIATE MEDICAL CARE IF:   Your joint becomes suddenly more tender, and you have chills or a fever. MAKE SURE YOU:   Understand these instructions.  Will watch your condition.  Will get help right away if you are not doing well or get worse.   This information is not intended to replace advice given to you by your health care provider. Make sure you discuss any questions you have with your health care provider.   Document Released: 10/13/2000 Document Revised: 11/06/2014 Document Reviewed: 05/29/2012 Elsevier Interactive Patient Education 2016  [place] on my medicine - ELIQUIS (apixaban)  This medication education was reviewed with me or my healthcare representative as part of my discharge preparation.  The pharmacist that spoke with me during my hospital stay was:  Kem Parkinson, [place]  Why was Eliquis prescribed for you? Eliquis was prescribed for you to reduce the risk of a blood clot forming that can cause a stroke if you have a medical condition called atrial fibrillation (a type of irregular heartbeat).  What do You need to know about Eliquis ? Take your Eliquis TWICE DAILY - one tablet in the morning and one tablet in the evening with or without food. If you have difficulty swallowing the tablet whole please discuss with your pharmacist how to take the medication safely.  Take Eliquis exactly as prescribed by your doctor and DO NOT stop taking Eliquis without talking to the doctor who prescribed the medication.  Stopping may increase your risk of developing a stroke.  Refill your prescription before you run out.  After discharge, you should have regular check-up appointments with your healthcare provider that is prescribing your Eliquis.  In the future your dose may need to be changed if your kidney function or weight changes by a significant amount or as you get older.  What do you do if you miss  a dose? If you miss a dose, take it as soon as you remember on the same day and resume taking twice daily.  Do not take more than one dose of ELIQUIS at the same time to make up a missed dose.  Important Safety Information A possible side effect of Eliquis is bleeding. You should call your healthcare provider right away if you experience any of the following: ? Bleeding from an injury or your nose that does not stop. ? Unusual colored urine (red or dark brown) or unusual colored stools (red or black). ? Unusual bruising for unknown reasons. ? A serious fall or if you hit your head (even if there is no  bleeding).  Some medicines may interact with Eliquis and might increase your risk of bleeding or clotting while on Eliquis. To help avoid this, consult your healthcare provider or pharmacist prior to using any new prescription or non-prescription medications, including herbals, vitamins, non-steroidal anti-inflammatory drugs (NSAIDs) and supplements.  This website has more information on Eliquis (apixaban): http://www.eliquis.com/eliquis/home

## 2015-11-15 DIAGNOSIS — N390 Urinary tract infection, site not specified: Secondary | ICD-10-CM | POA: Diagnosis not present

## 2015-11-15 DIAGNOSIS — E1122 Type 2 diabetes mellitus with diabetic chronic kidney disease: Secondary | ICD-10-CM | POA: Diagnosis not present

## 2015-11-15 DIAGNOSIS — N183 Chronic kidney disease, stage 3 (moderate): Secondary | ICD-10-CM | POA: Diagnosis not present

## 2015-11-15 DIAGNOSIS — I129 Hypertensive chronic kidney disease with stage 1 through stage 4 chronic kidney disease, or unspecified chronic kidney disease: Secondary | ICD-10-CM | POA: Diagnosis not present

## 2015-11-16 DIAGNOSIS — N390 Urinary tract infection, site not specified: Secondary | ICD-10-CM | POA: Diagnosis not present

## 2015-11-16 DIAGNOSIS — E1122 Type 2 diabetes mellitus with diabetic chronic kidney disease: Secondary | ICD-10-CM | POA: Diagnosis not present

## 2015-11-16 DIAGNOSIS — I129 Hypertensive chronic kidney disease with stage 1 through stage 4 chronic kidney disease, or unspecified chronic kidney disease: Secondary | ICD-10-CM | POA: Diagnosis not present

## 2015-11-16 DIAGNOSIS — N183 Chronic kidney disease, stage 3 (moderate): Secondary | ICD-10-CM | POA: Diagnosis not present

## 2015-11-19 DIAGNOSIS — I129 Hypertensive chronic kidney disease with stage 1 through stage 4 chronic kidney disease, or unspecified chronic kidney disease: Secondary | ICD-10-CM | POA: Diagnosis not present

## 2015-11-19 DIAGNOSIS — E1122 Type 2 diabetes mellitus with diabetic chronic kidney disease: Secondary | ICD-10-CM | POA: Diagnosis not present

## 2015-11-19 DIAGNOSIS — N183 Chronic kidney disease, stage 3 (moderate): Secondary | ICD-10-CM | POA: Diagnosis not present

## 2015-11-19 DIAGNOSIS — N390 Urinary tract infection, site not specified: Secondary | ICD-10-CM | POA: Diagnosis not present

## 2015-11-25 DIAGNOSIS — E1122 Type 2 diabetes mellitus with diabetic chronic kidney disease: Secondary | ICD-10-CM | POA: Diagnosis not present

## 2015-11-25 DIAGNOSIS — N390 Urinary tract infection, site not specified: Secondary | ICD-10-CM | POA: Diagnosis not present

## 2015-11-25 DIAGNOSIS — I129 Hypertensive chronic kidney disease with stage 1 through stage 4 chronic kidney disease, or unspecified chronic kidney disease: Secondary | ICD-10-CM | POA: Diagnosis not present

## 2015-11-25 DIAGNOSIS — N183 Chronic kidney disease, stage 3 (moderate): Secondary | ICD-10-CM | POA: Diagnosis not present

## 2015-11-30 DIAGNOSIS — E1122 Type 2 diabetes mellitus with diabetic chronic kidney disease: Secondary | ICD-10-CM | POA: Diagnosis not present

## 2015-11-30 DIAGNOSIS — N390 Urinary tract infection, site not specified: Secondary | ICD-10-CM | POA: Diagnosis not present

## 2015-11-30 DIAGNOSIS — I129 Hypertensive chronic kidney disease with stage 1 through stage 4 chronic kidney disease, or unspecified chronic kidney disease: Secondary | ICD-10-CM | POA: Diagnosis not present

## 2015-11-30 DIAGNOSIS — N183 Chronic kidney disease, stage 3 (moderate): Secondary | ICD-10-CM | POA: Diagnosis not present

## 2015-12-02 DIAGNOSIS — E1122 Type 2 diabetes mellitus with diabetic chronic kidney disease: Secondary | ICD-10-CM | POA: Diagnosis not present

## 2015-12-02 DIAGNOSIS — I129 Hypertensive chronic kidney disease with stage 1 through stage 4 chronic kidney disease, or unspecified chronic kidney disease: Secondary | ICD-10-CM | POA: Diagnosis not present

## 2015-12-02 DIAGNOSIS — N390 Urinary tract infection, site not specified: Secondary | ICD-10-CM | POA: Diagnosis not present

## 2015-12-02 DIAGNOSIS — N183 Chronic kidney disease, stage 3 (moderate): Secondary | ICD-10-CM | POA: Diagnosis not present

## 2015-12-03 DIAGNOSIS — N183 Chronic kidney disease, stage 3 (moderate): Secondary | ICD-10-CM | POA: Diagnosis not present

## 2015-12-03 DIAGNOSIS — E1122 Type 2 diabetes mellitus with diabetic chronic kidney disease: Secondary | ICD-10-CM | POA: Diagnosis not present

## 2015-12-03 DIAGNOSIS — I129 Hypertensive chronic kidney disease with stage 1 through stage 4 chronic kidney disease, or unspecified chronic kidney disease: Secondary | ICD-10-CM | POA: Diagnosis not present

## 2015-12-03 DIAGNOSIS — N390 Urinary tract infection, site not specified: Secondary | ICD-10-CM | POA: Diagnosis not present

## 2015-12-09 DIAGNOSIS — E1122 Type 2 diabetes mellitus with diabetic chronic kidney disease: Secondary | ICD-10-CM | POA: Diagnosis not present

## 2015-12-09 DIAGNOSIS — N183 Chronic kidney disease, stage 3 (moderate): Secondary | ICD-10-CM | POA: Diagnosis not present

## 2015-12-09 DIAGNOSIS — I129 Hypertensive chronic kidney disease with stage 1 through stage 4 chronic kidney disease, or unspecified chronic kidney disease: Secondary | ICD-10-CM | POA: Diagnosis not present

## 2015-12-09 DIAGNOSIS — N390 Urinary tract infection, site not specified: Secondary | ICD-10-CM | POA: Diagnosis not present

## 2015-12-13 ENCOUNTER — Emergency Department (HOSPITAL_COMMUNITY): Payer: Medicare Other

## 2015-12-13 ENCOUNTER — Emergency Department (HOSPITAL_COMMUNITY)
Admission: EM | Admit: 2015-12-13 | Discharge: 2015-12-13 | Disposition: A | Discharge status: Home / Self Care | Payer: Medicare Other | Attending: Emergency Medicine | Admitting: Emergency Medicine

## 2015-12-13 ENCOUNTER — Encounter (HOSPITAL_COMMUNITY): Payer: Self-pay | Admitting: Emergency Medicine

## 2015-12-13 DIAGNOSIS — M7989 Other specified soft tissue disorders: Secondary | ICD-10-CM | POA: Diagnosis not present

## 2015-12-13 DIAGNOSIS — W1789XA Other fall from one level to another, initial encounter: Secondary | ICD-10-CM | POA: Insufficient documentation

## 2015-12-13 DIAGNOSIS — Y9289 Other specified places as the place of occurrence of the external cause: Secondary | ICD-10-CM | POA: Diagnosis not present

## 2015-12-13 DIAGNOSIS — E119 Type 2 diabetes mellitus without complications: Secondary | ICD-10-CM | POA: Insufficient documentation

## 2015-12-13 DIAGNOSIS — I10 Essential (primary) hypertension: Secondary | ICD-10-CM | POA: Insufficient documentation

## 2015-12-13 DIAGNOSIS — Y998 Other external cause status: Secondary | ICD-10-CM | POA: Insufficient documentation

## 2015-12-13 DIAGNOSIS — Y9389 Activity, other specified: Secondary | ICD-10-CM | POA: Insufficient documentation

## 2015-12-13 DIAGNOSIS — R509 Fever, unspecified: Secondary | ICD-10-CM | POA: Insufficient documentation

## 2015-12-13 DIAGNOSIS — S6992XA Unspecified injury of left wrist, hand and finger(s), initial encounter: Secondary | ICD-10-CM | POA: Diagnosis not present

## 2015-12-13 HISTORY — DX: Dehydration: E86.0

## 2015-12-13 HISTORY — DX: Urinary tract infection, site not specified: N39.0

## 2015-12-13 NOTE — ED Notes (Signed)
Per son, Courtney Bradford, he is going to take his mother home. Informed of x ray per request.

## 2015-12-13 NOTE — ED Notes (Signed)
Patient fell from Recliner to floor when she was trying to get up from chair. Per son, patient was in floor approximately 1 hour. Reports fever. C/o left wrist pain. CMS intact. H/o afib, IDDM, gout, CHF. Patient alert. On Blood thinner for afib (Elaquis).

## 2016-01-04 DIAGNOSIS — Z0000 Encounter for general adult medical examination without abnormal findings: Secondary | ICD-10-CM | POA: Diagnosis not present

## 2016-01-04 DIAGNOSIS — I4891 Unspecified atrial fibrillation: Secondary | ICD-10-CM | POA: Diagnosis not present

## 2016-01-04 DIAGNOSIS — N183 Chronic kidney disease, stage 3 (moderate): Secondary | ICD-10-CM | POA: Diagnosis not present

## 2016-01-04 DIAGNOSIS — E1122 Type 2 diabetes mellitus with diabetic chronic kidney disease: Secondary | ICD-10-CM | POA: Diagnosis not present

## 2016-02-28 ENCOUNTER — Encounter (HOSPITAL_COMMUNITY): Payer: Self-pay | Admitting: Emergency Medicine

## 2016-02-28 ENCOUNTER — Inpatient Hospital Stay (HOSPITAL_COMMUNITY)
Admission: EM | Admit: 2016-02-28 | Discharge: 2016-03-05 | DRG: 291 | Disposition: A | Discharge status: Home Health | Payer: Medicare Other | Attending: Internal Medicine | Admitting: Internal Medicine

## 2016-02-28 ENCOUNTER — Observation Stay (HOSPITAL_COMMUNITY): Payer: Medicare Other

## 2016-02-28 DIAGNOSIS — Z79899 Other long term (current) drug therapy: Secondary | ICD-10-CM | POA: Diagnosis not present

## 2016-02-28 DIAGNOSIS — E1129 Type 2 diabetes mellitus with other diabetic kidney complication: Secondary | ICD-10-CM

## 2016-02-28 DIAGNOSIS — N179 Acute kidney failure, unspecified: Secondary | ICD-10-CM | POA: Diagnosis not present

## 2016-02-28 DIAGNOSIS — E875 Hyperkalemia: Secondary | ICD-10-CM | POA: Diagnosis not present

## 2016-02-28 DIAGNOSIS — G9349 Other encephalopathy: Secondary | ICD-10-CM | POA: Diagnosis not present

## 2016-02-28 DIAGNOSIS — Z7901 Long term (current) use of anticoagulants: Secondary | ICD-10-CM

## 2016-02-28 DIAGNOSIS — R531 Weakness: Secondary | ICD-10-CM | POA: Diagnosis not present

## 2016-02-28 DIAGNOSIS — G9340 Encephalopathy, unspecified: Secondary | ICD-10-CM | POA: Diagnosis not present

## 2016-02-28 DIAGNOSIS — Z7982 Long term (current) use of aspirin: Secondary | ICD-10-CM

## 2016-02-28 DIAGNOSIS — I482 Chronic atrial fibrillation, unspecified: Secondary | ICD-10-CM | POA: Diagnosis present

## 2016-02-28 DIAGNOSIS — R410 Disorientation, unspecified: Secondary | ICD-10-CM

## 2016-02-28 DIAGNOSIS — N390 Urinary tract infection, site not specified: Secondary | ICD-10-CM | POA: Diagnosis present

## 2016-02-28 DIAGNOSIS — E1122 Type 2 diabetes mellitus with diabetic chronic kidney disease: Secondary | ICD-10-CM | POA: Diagnosis not present

## 2016-02-28 DIAGNOSIS — Z794 Long term (current) use of insulin: Secondary | ICD-10-CM | POA: Diagnosis not present

## 2016-02-28 DIAGNOSIS — I5033 Acute on chronic diastolic (congestive) heart failure: Secondary | ICD-10-CM | POA: Diagnosis not present

## 2016-02-28 DIAGNOSIS — E119 Type 2 diabetes mellitus without complications: Secondary | ICD-10-CM

## 2016-02-28 DIAGNOSIS — I130 Hypertensive heart and chronic kidney disease with heart failure and stage 1 through stage 4 chronic kidney disease, or unspecified chronic kidney disease: Secondary | ICD-10-CM | POA: Diagnosis not present

## 2016-02-28 DIAGNOSIS — E876 Hypokalemia: Secondary | ICD-10-CM | POA: Diagnosis present

## 2016-02-28 DIAGNOSIS — M109 Gout, unspecified: Secondary | ICD-10-CM | POA: Diagnosis not present

## 2016-02-28 DIAGNOSIS — R609 Edema, unspecified: Secondary | ICD-10-CM | POA: Diagnosis not present

## 2016-02-28 DIAGNOSIS — N183 Chronic kidney disease, stage 3 unspecified: Secondary | ICD-10-CM | POA: Diagnosis present

## 2016-02-28 DIAGNOSIS — I509 Heart failure, unspecified: Secondary | ICD-10-CM | POA: Diagnosis not present

## 2016-02-28 DIAGNOSIS — R627 Adult failure to thrive: Secondary | ICD-10-CM | POA: Diagnosis present

## 2016-02-28 DIAGNOSIS — R4182 Altered mental status, unspecified: Secondary | ICD-10-CM | POA: Diagnosis not present

## 2016-02-28 DIAGNOSIS — I4891 Unspecified atrial fibrillation: Secondary | ICD-10-CM | POA: Diagnosis present

## 2016-02-28 DIAGNOSIS — I10 Essential (primary) hypertension: Secondary | ICD-10-CM | POA: Diagnosis present

## 2016-02-28 DIAGNOSIS — I481 Persistent atrial fibrillation: Secondary | ICD-10-CM | POA: Diagnosis not present

## 2016-02-28 DIAGNOSIS — I5032 Chronic diastolic (congestive) heart failure: Secondary | ICD-10-CM | POA: Diagnosis present

## 2016-02-28 HISTORY — DX: Chronic kidney disease, stage 3 unspecified: N18.30

## 2016-02-28 HISTORY — DX: Chronic kidney disease, stage 3 (moderate): N18.3

## 2016-02-28 HISTORY — DX: Heart failure, unspecified: I50.9

## 2016-02-28 LAB — CREATININE, URINE, RANDOM: CREATININE, URINE: 52.44 mg/dL

## 2016-02-28 LAB — URINE MICROSCOPIC-ADD ON

## 2016-02-28 LAB — CBC
HEMATOCRIT: 26.3 % — AB (ref 36.0–46.0)
HEMOGLOBIN: 8.4 g/dL — AB (ref 12.0–15.0)
MCH: 25.9 pg — ABNORMAL LOW (ref 26.0–34.0)
MCHC: 31.9 g/dL (ref 30.0–36.0)
MCV: 81.2 fL (ref 78.0–100.0)
Platelets: 464 10*3/uL — ABNORMAL HIGH (ref 150–400)
RBC: 3.24 MIL/uL — AB (ref 3.87–5.11)
RDW: 14.9 % (ref 11.5–15.5)
WBC: 8.7 10*3/uL (ref 4.0–10.5)

## 2016-02-28 LAB — URINALYSIS, ROUTINE W REFLEX MICROSCOPIC
Bilirubin Urine: NEGATIVE
GLUCOSE, UA: NEGATIVE mg/dL
KETONES UR: NEGATIVE mg/dL
NITRITE: NEGATIVE
PH: 5.5 (ref 5.0–8.0)
Protein, ur: NEGATIVE mg/dL
SPECIFIC GRAVITY, URINE: 1.013 (ref 1.005–1.030)

## 2016-02-28 LAB — BASIC METABOLIC PANEL
ANION GAP: 16 — AB (ref 5–15)
BUN: 77 mg/dL — AB (ref 6–20)
CHLORIDE: 103 mmol/L (ref 101–111)
CO2: 21 mmol/L — ABNORMAL LOW (ref 22–32)
Calcium: 9.4 mg/dL (ref 8.9–10.3)
Creatinine, Ser: 2.19 mg/dL — ABNORMAL HIGH (ref 0.44–1.00)
GFR, EST AFRICAN AMERICAN: 23 mL/min — AB (ref >60)
GFR, EST NON AFRICAN AMERICAN: 20 mL/min — AB (ref >60)
Glucose, Bld: 142 mg/dL — ABNORMAL HIGH (ref 65–99)
POTASSIUM: 2.9 mmol/L — AB (ref 3.5–5.1)
SODIUM: 140 mmol/L (ref 135–145)

## 2016-02-28 LAB — PROTIME-INR
INR: 2.03 — AB (ref 0.00–1.49)
PROTHROMBIN TIME: 22.8 s — AB (ref 11.6–15.2)

## 2016-02-28 LAB — LACTIC ACID, PLASMA: Lactic Acid, Venous: 1 mmol/L (ref 0.5–2.0)

## 2016-02-28 LAB — TROPONIN I: Troponin I: 0.04 ng/mL — ABNORMAL HIGH (ref <0.031)

## 2016-02-28 LAB — PROCALCITONIN: Procalcitonin: 0.38 ng/mL

## 2016-02-28 LAB — APTT: aPTT: 45 seconds — ABNORMAL HIGH (ref 24–37)

## 2016-02-28 MED ORDER — POTASSIUM CHLORIDE CRYS ER 20 MEQ PO TBCR
40.0000 meq | EXTENDED_RELEASE_TABLET | Freq: Once | ORAL | Status: AC
  Administered 2016-02-28: 40 meq via ORAL
  Filled 2016-02-28: qty 2

## 2016-02-28 MED ORDER — METOPROLOL TARTRATE 25 MG/10 ML ORAL SUSPENSION
6.2500 mg | Freq: Every day | ORAL | Status: DC
  Administered 2016-02-29 – 2016-03-05 (×6): 6.25 mg via ORAL
  Filled 2016-02-28 (×6): qty 5

## 2016-02-28 MED ORDER — DEXTROSE 5 % IV SOLN
1.0000 g | INTRAVENOUS | Status: DC
  Administered 2016-02-29 – 2016-03-02 (×3): 1 g via INTRAVENOUS
  Filled 2016-02-28 (×5): qty 10

## 2016-02-28 MED ORDER — CLONIDINE HCL 0.3 MG/24HR TD PTWK
0.3000 mg | MEDICATED_PATCH | TRANSDERMAL | Status: DC
  Administered 2016-03-03: 0.3 mg via TRANSDERMAL
  Filled 2016-02-28: qty 1

## 2016-02-28 MED ORDER — ONDANSETRON HCL 4 MG/2ML IJ SOLN: 4.0000 mg | Freq: Four times a day (QID) | INTRAMUSCULAR | Status: DC | PRN

## 2016-02-28 MED ORDER — APIXABAN 5 MG PO TABS
5.0000 mg | ORAL_TABLET | Freq: Two times a day (BID) | ORAL | Status: DC
Start: 2016-02-28 — End: 2016-02-28

## 2016-02-28 MED ORDER — COLCHICINE 0.6 MG PO TABS
0.6000 mg | ORAL_TABLET | Freq: Every day | ORAL | Status: DC
  Administered 2016-02-29 – 2016-03-05 (×6): 0.6 mg via ORAL
  Filled 2016-02-28 (×7): qty 1

## 2016-02-28 MED ORDER — POTASSIUM CHLORIDE 10 MEQ/100ML IV SOLN
10.0000 meq | Freq: Once | INTRAVENOUS | Status: AC
  Administered 2016-02-28: 10 meq via INTRAVENOUS
  Filled 2016-02-28: qty 100

## 2016-02-28 MED ORDER — OLOPATADINE HCL 0.1 % OP SOLN
1.0000 [drp] | Freq: Two times a day (BID) | OPHTHALMIC | Status: DC
  Administered 2016-02-29 – 2016-03-05 (×12): 1 [drp] via OPHTHALMIC
  Filled 2016-02-28: qty 5

## 2016-02-28 MED ORDER — SODIUM CHLORIDE 0.9 % IV BOLUS (SEPSIS)
250.0000 mL | Freq: Once | INTRAVENOUS | Status: AC
  Administered 2016-02-28: 250 mL via INTRAVENOUS

## 2016-02-28 MED ORDER — SODIUM CHLORIDE 0.9% FLUSH: 3.0000 mL | INTRAVENOUS | Status: DC | PRN

## 2016-02-28 MED ORDER — APIXABAN 2.5 MG PO TABS
2.5000 mg | ORAL_TABLET | Freq: Two times a day (BID) | ORAL | Status: DC
  Administered 2016-02-29 – 2016-03-05 (×12): 2.5 mg via ORAL
  Filled 2016-02-28 (×12): qty 1

## 2016-02-28 MED ORDER — METOPROLOL TARTRATE 12.5 MG HALF TABLET: 6.2500 mg | ORAL_TABLET | Freq: Every day | ORAL | Status: DC

## 2016-02-28 MED ORDER — AMLODIPINE BESYLATE 5 MG PO TABS
5.0000 mg | ORAL_TABLET | Freq: Every day | ORAL | Status: DC
  Administered 2016-02-29 – 2016-03-05 (×6): 5 mg via ORAL
  Filled 2016-02-28 (×3): qty 1
  Filled 2016-02-28: qty 2
  Filled 2016-02-28 (×3): qty 1

## 2016-02-28 MED ORDER — FERROUS FUMARATE 324 (106 FE) MG PO TABS
1.0000 | ORAL_TABLET | Freq: Every morning | ORAL | Status: DC
  Administered 2016-02-29 – 2016-03-05 (×6): 106 mg via ORAL
  Filled 2016-02-28 (×6): qty 1

## 2016-02-28 MED ORDER — AMLODIPINE BESYLATE 5 MG PO TABS: 5.0000 mg | ORAL_TABLET | Freq: Every day | ORAL | Status: DC

## 2016-02-28 MED ORDER — ACETAMINOPHEN 325 MG PO TABS
650.0000 mg | ORAL_TABLET | ORAL | Status: DC | PRN
  Administered 2016-02-29: 650 mg via ORAL
  Filled 2016-02-28: qty 2

## 2016-02-28 MED ORDER — DEXTROSE 5 % IV SOLN
1.0000 g | Freq: Once | INTRAVENOUS | Status: AC
  Administered 2016-02-28: 1 g via INTRAVENOUS

## 2016-02-28 MED ORDER — SODIUM CHLORIDE 0.9 % IV SOLN: 250.0000 mL | INTRAVENOUS | Status: DC | PRN

## 2016-02-28 MED ORDER — HYDRALAZINE HCL 20 MG/ML IJ SOLN: 5.0000 mg | INTRAMUSCULAR | Status: DC | PRN

## 2016-02-28 MED ORDER — INSULIN ASPART 100 UNIT/ML ~~LOC~~ SOLN: 0.0000 [IU] | Freq: Every day | SUBCUTANEOUS | Status: DC

## 2016-02-28 MED ORDER — INSULIN ASPART 100 UNIT/ML ~~LOC~~ SOLN
0.0000 [IU] | Freq: Three times a day (TID) | SUBCUTANEOUS | Status: DC
  Administered 2016-02-29 (×2): 2 [IU] via SUBCUTANEOUS
  Administered 2016-03-01: 3 [IU] via SUBCUTANEOUS
  Administered 2016-03-02 (×2): 2 [IU] via SUBCUTANEOUS
  Administered 2016-03-03: 3 [IU] via SUBCUTANEOUS
  Administered 2016-03-04: 2 [IU] via SUBCUTANEOUS
  Administered 2016-03-05: 1 [IU] via SUBCUTANEOUS

## 2016-02-28 MED ORDER — SODIUM CHLORIDE 0.9% FLUSH
3.0000 mL | Freq: Two times a day (BID) | INTRAVENOUS | Status: DC
  Administered 2016-02-28 – 2016-03-04 (×10): 3 mL via INTRAVENOUS

## 2016-02-28 MED ORDER — FUROSEMIDE 10 MG/ML IJ SOLN
40.0000 mg | Freq: Two times a day (BID) | INTRAMUSCULAR | Status: DC
  Administered 2016-02-29 – 2016-03-02 (×6): 40 mg via INTRAVENOUS
  Filled 2016-02-28 (×6): qty 4

## 2016-02-28 MED ORDER — SODIUM CHLORIDE 0.9 % IV SOLN
INTRAVENOUS | Status: DC
  Administered 2016-02-28: 19:00:00 via INTRAVENOUS

## 2016-02-28 NOTE — ED Notes (Addendum)
Pt here with son. Son reports 3+ pitting edema to bilateral legs. Son also reports failure to thrive symptoms. Pt has hx CHF, a-fib, "soreness all over the body." a/o x 4resp e/u. Son also reports increasing confusion over the last 6 months. He is primary caregiver for patient. Pt son also suspects UTI.

## 2016-02-28 NOTE — ED Provider Notes (Signed)
CSN: [id number]     Arrival date & time 02/28/16  1644 History   First MD Initiated Contact with Patient 02/28/16 1759     Chief Complaint  Patient presents with  . Leg Swelling  . Fatigue  . Failure To Thrive     (Consider location/radiation/quality/duration/timing/severity/associated sxs/prior Treatment) HPI   PEYTON KEEFAUVER is a 82 y.o. female who presents for evaluation of weakness, confusion, leg edema, inability to ambulate, and decreased oral intake. She lives with her son at home, who is her primary caregiver. She has been sitting in a recliner during the day, and sleeping in her bed at night. She is taking her usual medications, without relief. She has frequent urinary tract infections. She also complains of pain and swelling in the fingers 4 and 5 right hand . She is unable to contribute to history.   Past Medical History  Diagnosis Date  . Hypertension   . Diabetes mellitus   . Anemia   . Cataract   . Headache(784.0)   . Arthritis     right knee  . A-fib ([place])   . Gout   . UTI (lower urinary tract infection)   . Dehydration   . CHF (congestive heart failure) ([place])   . CKD (chronic kidney disease), stage III    Past Surgical History  Procedure Laterality Date  . Abdominal hysterectomy    . Breast surgery      breast biopsy  . Anterior and posterior repair  05/17/2011    Procedure: ANTERIOR (CYSTOCELE) AND POSTERIOR REPAIR (RECTOCELE);  Surgeon: Eli Hose, MD;  Location: [facility];  Service: Gynecology;  Laterality: N/A;  Anterior repair with TVT bladder sling and cystoscopy  . Bladder suspension  05/17/2011    Procedure: TRANSVAGINAL TAPE (TVT) PROCEDURE;  Surgeon: Eli Hose, MD;  Location: [facility];  Service: Gynecology;  Laterality: N/A;   History reviewed. No pertinent family history. Social History  Substance Use Topics  . Smoking status: Never Smoker   . Smokeless tobacco: None  . Alcohol Use: No   OB History    No data available     Review  of Systems  Unable to perform ROS: Mental status change      Allergies  Lactose intolerance (gi)  Home Medications   Prior to Admission medications   Medication Sig Start Date End Date Taking? Authorizing Provider  amLODipine (NORVASC) 5 MG tablet Take 5 mg by mouth daily.     Yes Historical Provider, MD  Amlodipine-Valsartan-HCTZ [id number] MG TABS Take 1 tablet by mouth daily.    Yes Historical Provider, MD  apixaban (ELIQUIS) 5 MG TABS tablet Take 1 tablet (5 mg total) by mouth 2 (two) times daily. 07/31/14  Yes Hosie Poisson, MD  cloNIDine (CATAPRES - DOSED IN MG/24 HR) 0.3 mg/24hr patch Place 0.3 mg onto the skin every Friday.    Yes Historical Provider, MD  ferrous fumarate (HEMOCYTE - 106 MG FE) 325 (106 FE) MG TABS tablet Take 1 tablet by mouth every morning.    Yes Historical Provider, MD  furosemide (LASIX) 20 MG tablet Take 20 mg by mouth daily.   Yes Historical Provider, MD  insulin regular (HUMULIN R) 100 units/mL injection Inject 10 Units into the skin every morning.     Yes Historical Provider, MD  metoprolol tartrate (LOPRESSOR) 12.5 mg TABS tablet Take 0.5 tablets (12.5 mg total) by mouth daily. Patient taking differently: Take 6.25 mg by mouth daily.  07/31/14  Yes Hosie Poisson,  MD  Olopatadine HCl 0.2 % SOLN Place 1 drop into both eyes daily as needed (watery eyes).    Yes Historical Provider, MD  cefUROXime (CEFTIN) 250 MG tablet Take 1 tablet (250 mg total) by mouth 2 (two) times daily with a meal. 11/14/15   Maryann Mikhail, DO  colchicine 0.6 MG tablet Take 1 tablet (0.6 mg total) by mouth daily. 11/14/15   Maryann Mikhail, DO  predniSONE (DELTASONE) 10 MG tablet Take  40mg  (4 tabs) x 2 days, then taper to 30mg  (3 tabs) x 2 days, then 20mg  (2 tabs) x  2days, then 10mg  (1 tab) x 2 days, then OFF. 11/14/15   Maryann Mikhail, DO   BP 128/52 mmHg  Pulse 67  Temp(Src) 100.1 F (37.8 C) (Oral)  Resp 21  Ht 5\' 4"  (1.626 m)  Wt 161 lb (73.029 kg)  BMI 27.62 kg/m2  SpO2  98% Physical Exam  Constitutional: She appears well-developed.  Elderly, frail  HENT:  Head: Normocephalic and atraumatic.  Right Ear: External ear normal.  Left Ear: External ear normal.  Eyes: Conjunctivae and EOM are normal. Pupils are equal, round, and reactive to light.  Neck: Normal range of motion and phonation normal. Neck supple.  Cardiovascular: Normal rate, regular rhythm and normal heart sounds.   Pulmonary/Chest: Effort normal and breath sounds normal. She exhibits no bony tenderness.  Abdominal: Soft. There is no tenderness.  Musculoskeletal: Normal range of motion.  Right hand, finger 4, mildly swollen, erythematous with decreased motion secondary to pain. Right finger 5 has very minimal swelling and has pain with palpation. Limited flexion, right fingers 4 and 5 secondary to pain.  Neurological: She is alert. No cranial nerve deficit or sensory deficit. She exhibits normal muscle tone. Coordination normal.  Skin: Skin is warm, dry and intact.  Psychiatric: She has a normal mood and affect. Her behavior is normal. Judgment normal.  Nursing note and vitals reviewed.   ED Course  Procedures (including critical care time)  Medications  apixaban (ELIQUIS) tablet 5 mg (not administered)  cloNIDine (CATAPRES - Dosed in mg/24 hr) patch 0.3 mg (not administered)  ferrous fumarate (HEMOCYTE - 106 mg FE) tablet 106 mg of iron (not administered)  metoprolol tartrate (LOPRESSOR) tablet 12.5 mg (not administered)  amLODipine (NORVASC) tablet 5 mg (not administered)  olopatadine (PATANOL) 0.1 % ophthalmic solution 1 drop (not administered)  cefTRIAXone (ROCEPHIN) 1 g in dextrose 5 % 50 mL IVPB (not administered)  hydrALAZINE (APRESOLINE) injection 5 mg (not administered)  insulin aspart (novoLOG) injection 0-9 Units (not administered)  insulin aspart (novoLOG) injection 0-5 Units (not administered)  sodium chloride flush (NS) 0.9 % injection 3 mL (not administered)  sodium  chloride flush (NS) 0.9 % injection 3 mL (not administered)  0.9 %  sodium chloride infusion (not administered)  acetaminophen (TYLENOL) tablet 650 mg (not administered)  ondansetron (ZOFRAN) injection 4 mg (not administered)  furosemide (LASIX) injection 40 mg (not administered)  colchicine tablet 0.6 mg (not administered)  potassium chloride SA (K-DUR,KLOR-CON) CR tablet 40 mEq (40 mEq Oral Given 02/28/16 1921)  potassium chloride 10 mEq in 100 mL IVPB (0 mEq Intravenous Stopped 02/28/16 2021)  sodium chloride 0.9 % bolus 250 mL (0 mLs Intravenous Stopped 02/28/16 2123)  cefTRIAXone (ROCEPHIN) 1 g in dextrose 5 % 50 mL IVPB (1 g Intravenous New Bag/Given 02/28/16 2153)    Patient Vitals for the past 24 hrs:  BP Temp Temp src Pulse Resp SpO2 Height Weight  02/28/16 2145 (!) 128/52  mmHg - - 67 21 98 % - -  02/28/16 2130 (!) 130/53 mmHg - - 64 20 97 % - -  02/28/16 2100 139/86 mmHg - - (!) 52 21 98 % - -  02/28/16 2015 (!) 114/47 mmHg - - 66 18 100 % - -  02/28/16 1945 (!) 122/53 mmHg - - 68 19 97 % - -  02/28/16 1930 145/84 mmHg - - 84 18 99 % - -  02/28/16 1900 (!) 113/49 mmHg - - 67 19 100 % - -  02/28/16 1702 136/69 mmHg 100.1 F (37.8 C) Oral 76 19 100 % - -  02/28/16 1656 - - - - - - 5\' 4"  (1.626 m) 161 lb (73.029 kg)    8:53 PM Reevaluation with update and discussion. After initial assessment and treatment, an updated evaluation reveals no change in clinical status. Patient and son updated on findings. [place] Prin[place] L    8:58 PM-Consult complete with Hospitalist. Patient case explained and discussed. He agrees to admit patient for further evaluation and treatment. Call ended at 2120  [place] - Abnormal; Notable for the following:    Potassium 2.9 (*)    CO2 21 (*)    Glucose, Bld 142 (*)    BUN 77 (*)    Creatinine, Ser 2.19 (*)    GFR calc non Af Amer 20 (*)    GFR calc Af Amer 23 (*)    Anion gap 16 (*)    All other components within  normal limits  CBC - Abnormal; Notable for the following:    RBC 3.24 (*)    Hemoglobin 8.4 (*)    HCT 26.3 (*)    MCH 25.9 (*)    Platelets 464 (*)    All other components within normal limits  URINALYSIS, ROUTINE W REFLEX MICROSCOPIC (NOT AT [facility]) - Abnormal; Notable for the following:    APPearance CLOUDY (*)    Hgb urine dipstick TRACE (*)    Leukocytes, UA MODERATE (*)    All other components within normal limits  URINE MICROSCOPIC-ADD ON - Abnormal; Notable for the following:    Squamous Epithelial / LPF 0-5 (*)    Bacteria, UA MANY (*)    All other components within normal limits  URINE CULTURE  CULTURE, BLOOD (ROUTINE X 2)  CULTURE, BLOOD (ROUTINE X 2)  MAGNESIUM  CREATININE, URINE, RANDOM  UREA NITROGEN, URINE  BRAIN NATRIURETIC PEPTIDE  BASIC METABOLIC PANEL  TROPONIN I  TROPONIN I  TROPONIN I  LACTIC ACID, PLASMA  LACTIC ACID, PLASMA  PROCALCITONIN  PROTIME-INR  APTT    Imaging Review No results found. I have personally reviewed and evaluated these images and lab results as part of my medical decision-making.   EKG Interpretation   Date/Time:  Monday Feb 28 2016 17:05:42 EDT Ventricular Rate:  71 PR Interval:    QRS Duration: 76 QT Interval:  414 QTC Calculation: 449 R Axis:   6 Text Interpretation:  Atrial flutter with variable A-V block Minimal  voltage criteria for LVH, may be normal variant Abnormal ECG since last  tracing no significant change Confirmed by Eulis Foster  MD, Vira Agar [id number]) on  02/28/2016 6:00:25 PM      MDM   Final diagnoses:  Hyperkalemia  UTI (lower urinary tract infection)  Weakness  Confusion   Weakness, malaise, and confusion related to UTI, and likely poor nutritional intake. Doubt sepsis, PE or pneumonia. She will require admission for further  treatment and stabilization.  Nursing Notes Reviewed/ Care Coordinated Applicable Imaging Reviewed Interpretation of Laboratory Data incorporated into ED treatment   Plan:  Admit  Daleen Bo, MD 02/28/16 2226

## 2016-02-28 NOTE — H&P (Signed)
History and Physical    Courtney Bradford [id number] DOB: 1931-10-17 DOA: 02/28/2016  Referring MD/NP/PA:   PCP: Maggie Font, MD   Outpatient Specialists: none  Patient coming from:  Home  Chief Complaint: Generalized weakness, bilateral leg edema and confusion  HPI: Courtney Bradford is a 82 y.o. female with medical history significant of hypertension, diabetes mellitus, gout, atrial fibrillation on Eliquis, diastolic congestive heart failure, anemia, arthritis, chronic kidney disease-stage III, recurrent UTI, who presents with generalized weakness, bilateral leg edema and confusion.  Patient has AMS is unable to provide accurate medical history, therefore, most of the history is obtained by discussing the case with ED physician and per EMS report. It seems that pt has been having generalized weakness, confusion, bilateral leg edema and decreased oral intake recently. She has pain over her  fingers 4 and 5 right hand, and left knee joint. She is not taking her gout medication, colchicine currently. When I saw in ED, she is confused, and oriented to place and year, but not to person. She denies chest pain, cough, abdominal pain, diarrhea. She moves all extremities. Not sure if she has symptoms of UTI.  ED Course: pt was found to have positive urinalysis with moderate amount of leukocytes, temperature 100.1, no tachycardia, WBCs 8.7, worsening renal function. Patient admitted to inpatient for further eval and treatment.  Review of Systems: Could not be reviewed accurately due to altered mental status.  Allergy:  Allergies  Allergen Reactions  . Lactose Intolerance (Gi) Other (See Comments)    Upset stomach    Past Medical History  Diagnosis Date  . Hypertension   . Diabetes mellitus   . Anemia   . Cataract   . Headache(784.0)   . Arthritis     right knee  . A-fib ([place])   . Gout   . UTI (lower urinary tract infection)   . Dehydration   . CHF (congestive heart failure) ([place])     . CKD (chronic kidney disease), stage III     Past Surgical History  Procedure Laterality Date  . Abdominal hysterectomy    . Breast surgery      breast biopsy  . Anterior and posterior repair  05/17/2011    Procedure: ANTERIOR (CYSTOCELE) AND POSTERIOR REPAIR (RECTOCELE);  Surgeon: Eli Hose, MD;  Location: [facility];  Service: Gynecology;  Laterality: N/A;  Anterior repair with TVT bladder sling and cystoscopy  . Bladder suspension  05/17/2011    Procedure: TRANSVAGINAL TAPE (TVT) PROCEDURE;  Surgeon: Eli Hose, MD;  Location: [facility];  Service: Gynecology;  Laterality: N/A;    Social History:  reports that she has never smoked. She does not have any smokeless tobacco history on file. She reports that she does not drink alcohol or use illicit drugs.  Family History: History reviewed. No pertinent family history.  Could not be reviewed accurately due to altered mental status.  Prior to Admission medications   Medication Sig Start Date End Date Taking? Authorizing Provider  amLODipine (NORVASC) 5 MG tablet Take 5 mg by mouth daily.     Yes Historical Provider, MD  Amlodipine-Valsartan-HCTZ [id number] MG TABS Take 1 tablet by mouth daily.    Yes Historical Provider, MD  apixaban (ELIQUIS) 5 MG TABS tablet Take 1 tablet (5 mg total) by mouth 2 (two) times daily. 07/31/14  Yes Hosie Poisson, MD  cloNIDine (CATAPRES - DOSED IN MG/24 HR) 0.3 mg/24hr patch Place 0.3 mg onto the skin every Friday.  Yes Historical Provider, MD  ferrous fumarate (HEMOCYTE - 106 MG FE) 325 (106 FE) MG TABS tablet Take 1 tablet by mouth every morning.    Yes Historical Provider, MD  furosemide (LASIX) 20 MG tablet Take 20 mg by mouth daily.   Yes Historical Provider, MD  insulin regular (HUMULIN R) 100 units/mL injection Inject 10 Units into the skin every morning.     Yes Historical Provider, MD  metoprolol tartrate (LOPRESSOR) 12.5 mg TABS tablet Take 0.5 tablets (12.5 mg total) by mouth daily. Patient  taking differently: Take 6.25 mg by mouth daily.  07/31/14  Yes Hosie Poisson, MD  Olopatadine HCl 0.2 % SOLN Place 1 drop into both eyes daily as needed (watery eyes).    Yes Historical Provider, MD  cefUROXime (CEFTIN) 250 MG tablet Take 1 tablet (250 mg total) by mouth 2 (two) times daily with a meal. 11/14/15   Maryann Mikhail, DO  colchicine 0.6 MG tablet Take 1 tablet (0.6 mg total) by mouth daily. 11/14/15   Maryann Mikhail, DO  predniSONE (DELTASONE) 10 MG tablet Take  40mg  (4 tabs) x 2 days, then taper to 30mg  (3 tabs) x 2 days, then 20mg  (2 tabs) x  2days, then 10mg  (1 tab) x 2 days, then OFF. 11/14/15   Cristal Ford, DO    Physical Exam: Filed Vitals:   02/28/16 1900 02/28/16 1930 02/28/16 1945 02/28/16 2015  BP: 113/49 145/84 122/53 114/47  Pulse: 67 84 68 66  Temp:      TempSrc:      Resp: 19 18 19 18   Height:      Weight:      SpO2: 100% 99% 97% 100%   General: Not in acute distress HEENT:       Eyes: PERRL, EOMI, no scleral icterus.       ENT: No discharge from the ears and nose, no pharynx injection, no tonsillar enlargement.        Neck: positive JVD, no bruit, no mass felt. Heme: No neck lymph node enlargement. Cardiac: S1/S2, RRR, No murmurs, No gallops or rubs. Pulm: No rales, wheezing, rhonchi or rubs. Abd: Soft, nondistended, nontender, no rebound pain, no organomegaly, BS present. GU: No hematuria Ext: 3+ pitting leg edema bilaterally. 2+DP/PT pulse bilaterally. Musculoskeletal: No joint deformities, No joint redness or warmth, no limitation of ROM in spin. Skin: No rashes.  Neuro: she is confused, and oriented to place and year, but not to person. Cranial nerves II-XII grossly intact, moves all extremities. Psych: Patient is not psychotic, no suicidal or hemocidal ideation.  Labs on Admission: I have personally reviewed following labs and imaging studies  CBC:  Recent Labs Lab 02/28/16 1713  WBC 8.7  HGB 8.4*  HCT 26.3*  MCV 81.2  PLT 464*   Basic  Metabolic Panel:  Recent Labs Lab 02/28/16 1713  NA 140  K 2.9*  CL 103  CO2 21*  GLUCOSE 142*  BUN 77*  CREATININE 2.19*  CALCIUM 9.4   GFR: Estimated Creatinine Clearance: 19.4 mL/min (by C-G formula based on Cr of 2.19). Liver Function Tests: No results for input(s): AST, ALT, ALKPHOS, BILITOT, PROT, ALBUMIN in the last 168 hours. No results for input(s): LIPASE, AMYLASE in the last 168 hours. No results for input(s): AMMONIA in the last 168 hours. Coagulation Profile: No results for input(s): INR, PROTIME in the last 168 hours. Cardiac Enzymes: No results for input(s): CKTOTAL, CKMB, CKMBINDEX, TROPONINI in the last 168 hours. BNP (last 3 results) No  results for input(s): PROBNP in the last 8760 hours. HbA1C: No results for input(s): HGBA1C in the last 72 hours. CBG: No results for input(s): GLUCAP in the last 168 hours. Lipid Profile: No results for input(s): CHOL, HDL, LDLCALC, TRIG, CHOLHDL, LDLDIRECT in the last 72 hours. Thyroid Function Tests: No results for input(s): TSH, T4TOTAL, FREET4, T3FREE, THYROIDAB in the last 72 hours. Anemia Panel: No results for input(s): VITAMINB12, FOLATE, FERRITIN, TIBC, IRON, RETICCTPCT in the last 72 hours. Urine analysis:    Component Value Date/Time   COLORURINE YELLOW 02/28/2016 1931   APPEARANCEUR CLOUDY* 02/28/2016 1931   LABSPEC 1.013 02/28/2016 1931   PHURINE 5.5 02/28/2016 1931   GLUCOSEU NEGATIVE 02/28/2016 1931   HGBUR TRACE* 02/28/2016 1931   BILIRUBINUR NEGATIVE 02/28/2016 1931   KETONESUR NEGATIVE 02/28/2016 1931   PROTEINUR NEGATIVE 02/28/2016 1931   UROBILINOGEN 1.0 07/22/2014 0300   NITRITE NEGATIVE 02/28/2016 1931   LEUKOCYTESUR MODERATE* 02/28/2016 1931   Sepsis Labs: @LABRCNTIP (procalcitonin:4,lacticidven:4) )No results found for this or any previous visit (from the past 240 hour(s)).   Radiological Exams on Admission: No results found.   EKG: Independently reviewed. QTc 449, A  flutter.  Assessment/Plan Principal Problem:   Acute on chronic diastolic heart failure (HCC) Active Problems:   Essential hypertension   Atrial fibrillation, chronic (HCC)   Diabetes mellitus type 2, controlled ([place])   UTI (urinary tract infection)   Acute renal failure superimposed on stage 3 chronic kidney disease (HCC)   Gout   A-fib (HCC)   Hypokalemia   Acute encephalopathy   Acute on chronic diastolic heart failure ([place]): 2-D echo 07/22/14 showed EF of 55-60 percent with grade 1 diastolic dysfunction. Patient has 3+ leg edema and positive JVD, indicating CHF exacerbation.  -will admit to tele bed for obs -Lasix 40 mg bid by IV -trop x 3 -2d echo -will continue home metoprolol -Daily weights -strict I/O's -Low salt diet -check BNP  Possible UTI: Patient has positive urinalysis. Not sure if patient has symptoms of her UTI due to altered mental status. Patient has fever of 100.1, will start antibiotics empirically. Pt is not septic, but pending lactate level. -IV rocephin -f/u Bx and Ux -will get Procalcitonin and trend lactic acid levels per sepsis protocol.  Acute encephalopathy: Patient is confused. Etiology is not clear. Likely multifactorial, including delirium, worsening congestive heart failure, and possible UTI. Pt moves all extremities, does not seem to have a acute stroke. Since patient is on Eliquis, we'll get CT scan to r/o intracranial bleeding. -Treat underlying issues -Frequent neuro check -PT/OT -f/u CT-head   Atrial Fibrillation: CHA2DS2-VASc Score is 6, needs oral anticoagulation. Patient is on Eliquis at home.  Heart rate is well controlled. -continue metoprolol and  Eliquis  Essential hypertension: -switch Amlodipine-Vaalsartan-HCTZ pill to amlodipine due to worsening renal function -Continue metoprolol -IV Lasix -IV hydralazine.  DM-II: Last A1c 5.3 on 11/11/15, well controled. Patient is taking Humulin at home -SSI -Check A1c  AoCKD-III:  Baseline creatinine 1.33 on 11/14/15. Her creatinine to 2.19. Likely due to cardiorenal syndrome and a continuation of ARB - Check  FeUrea - Follow up renal function by BMP - Hold Valsartan  Gout: has pain over right hand and knee -resurme colchicine  Hypokalemia: K= 2.9 on admission. - Repleted - Check Mg level  DVT ppx: On Eliquis Code Status: Full code Family Communication: None at bed side.  Disposition Plan:  Anticipate discharge back to previous home environment Consults called:  none Admission status:  Obs /  tele  Date of Service 02/28/2016    Ivor Costa [facility] Pager [phone]  If 7PM-7AM, please contact night-coverage [email or web address] Password [email or web address] 02/28/2016, 9:59 PM

## 2016-02-29 ENCOUNTER — Observation Stay (HOSPITAL_COMMUNITY): Payer: Medicare Other

## 2016-02-29 ENCOUNTER — Observation Stay (HOSPITAL_BASED_OUTPATIENT_CLINIC_OR_DEPARTMENT_OTHER): Payer: Medicare Other

## 2016-02-29 ENCOUNTER — Encounter (HOSPITAL_COMMUNITY): Payer: Self-pay | Admitting: *Deleted

## 2016-02-29 DIAGNOSIS — R4182 Altered mental status, unspecified: Secondary | ICD-10-CM | POA: Diagnosis not present

## 2016-02-29 DIAGNOSIS — I509 Heart failure, unspecified: Secondary | ICD-10-CM | POA: Diagnosis not present

## 2016-02-29 DIAGNOSIS — I5033 Acute on chronic diastolic (congestive) heart failure: Secondary | ICD-10-CM | POA: Diagnosis not present

## 2016-02-29 LAB — BRAIN NATRIURETIC PEPTIDE: B Natriuretic Peptide: 789.2 pg/mL — ABNORMAL HIGH (ref 0.0–100.0)

## 2016-02-29 LAB — BASIC METABOLIC PANEL
Anion gap: 14 (ref 5–15)
BUN: 67 mg/dL — AB (ref 6–20)
CALCIUM: 9.5 mg/dL (ref 8.9–10.3)
CHLORIDE: 108 mmol/L (ref 101–111)
CO2: 21 mmol/L — AB (ref 22–32)
CREATININE: 1.77 mg/dL — AB (ref 0.44–1.00)
GFR calc non Af Amer: 26 mL/min — ABNORMAL LOW (ref >60)
GFR, EST AFRICAN AMERICAN: 30 mL/min — AB (ref >60)
GLUCOSE: 103 mg/dL — AB (ref 65–99)
Potassium: 3.6 mmol/L (ref 3.5–5.1)
Sodium: 143 mmol/L (ref 135–145)

## 2016-02-29 LAB — LACTIC ACID, PLASMA: Lactic Acid, Venous: 1.1 mmol/L (ref 0.5–2.0)

## 2016-02-29 LAB — GLUCOSE, CAPILLARY
GLUCOSE-CAPILLARY: 109 mg/dL — AB (ref 65–99)
GLUCOSE-CAPILLARY: 196 mg/dL — AB (ref 65–99)
Glucose-Capillary: 120 mg/dL — ABNORMAL HIGH (ref 65–99)
Glucose-Capillary: 173 mg/dL — ABNORMAL HIGH (ref 65–99)
Glucose-Capillary: 160 mg/dL — ABNORMAL HIGH (ref 65–99)

## 2016-02-29 LAB — ECHOCARDIOGRAM COMPLETE
Height: 64 in
Weight: 2480 oz

## 2016-02-29 LAB — TROPONIN I
TROPONIN I: 0.03 ng/mL (ref <0.031)
Troponin I: 0.04 ng/mL — ABNORMAL HIGH (ref <0.031)

## 2016-02-29 LAB — MAGNESIUM: Magnesium: 1.4 mg/dL — ABNORMAL LOW (ref 1.7–2.4)

## 2016-02-29 NOTE — Evaluation (Signed)
Occupational Therapy Evaluation Patient Details Name: Courtney Bradford MRN: [id number] DOB: 20-Jul-1931 Today's Date: 02/29/2016    History of Present Illness Courtney Bradford is a 82 y.o. female with medical history significant of hypertension, diabetes mellitus, gout, atrial fibrillation on Eliquis, diastolic congestive heart failure, anemia, arthritis, chronic kidney disease-stage III, recurrent UTI, who presents with generalized weakness, bilateral leg edema and confusion.   Clinical Impression   Patient presenting with decreased ADL and functional mobility independence secondary to above. Patient mod I to supervision PTA, per patient's report. Patient currently functioning at an overall min to total assist level, requiring mod assist +2 for sit to/from stands. Pt poor historian who came to the hospital with confusion, however unsure of patient's baseline. Patient will benefit from acute OT to increase overall independence in the areas of ADLs, functional mobility, and overall safety in order to safely discharge to venue listed below.     Follow Up Recommendations  SNF;Supervision/Assistance - 24 hour    Equipment Recommendations  Other (comment) (TBD next venue of care)    Recommendations for Other Services  None at this time    Precautions / Restrictions Precautions Precautions: Fall Restrictions Weight Bearing Restrictions: No    Mobility Bed Mobility Overal bed mobility: Needs Assistance Bed Mobility: Supine to Sit     Supine to sit: Min guard     General bed mobility comments: Increased time needed with HOB slightly elevated and min use of bed rails   Transfers Overall transfer level: Needs assistance Equipment used: Rolling walker (2 wheeled) Transfers: Sit to/from Stand Sit to Stand: Mod assist;+2 physical assistance;+2 safety/equipment         General transfer comment: Pt with posterior lean and flexed posture so much to where she ended up sitting back down  within ~20 seconds of standing first attempt. Multimodal cueing needed for upright posture and encouragement to keep standing.     Balance Overall balance assessment: Needs assistance Sitting-balance support: No upper extremity supported;Feet supported Sitting balance-Leahy Scale: Good     Standing balance support: Bilateral upper extremity supported;During functional activity Standing balance-Leahy Scale: Poor Standing balance comment: Pt with unsafe RW awareness and safety, RW up off ground when pt attempting to pivot.     ADL Overall ADL's : Needs assistance/impaired Eating/Feeding: Set up;Bed level   Grooming: Set up;Sitting   Upper Body Bathing: Minimal assitance;Sitting   Lower Body Bathing: Moderate assistance;Sit to/from stand   Upper Body Dressing : Minimal assistance;Sitting   Lower Body Dressing: Maximal assistance;Sit to/from stand   Toilet Transfer: Moderate assistance;+2 for physical assistance;+2 for safety/equipment;Stand-pivot;RW Toilet Transfer Details (indicate cue type and reason): simulated  Toileting- Clothing Manipulation and Hygiene: Total assistance;Sit to/from Nurse, children's Details (indicate cue type and reason): did not occur   General ADL Comments: Pt found supine in bed. With mod encouragement, pt willing to work with therapists. Pt engaged in bed mobility and sat EOB, pt performed sit to/stand (needing increased time to process and get ready for this transfer). Pt stood and hand incontinence. Pt with decreased awareness regarding why she needed to stand again for therapist to assist with hygiene and clean-up "they just bathed me". Pt stood again and with more urine incontinenece. Assisted with clean up while pt standing, pt then performed stand pivot transfer EOB to recliner. Notified RN of patient's incontinence and mobility status.     Pertinent Vitals/Pain Pain Assessment: Faces Faces Pain Scale: Hurts even more Pain Location:  Knees during mobility  Pain Descriptors / Indicators: Aching;Guarding;Grimacing Pain Intervention(s): Limited activity within patient's tolerance;Monitored during session;Repositioned     Hand Dominance Right   Extremity/Trunk Assessment Upper Extremity Assessment Upper Extremity Assessment: Generalized weakness   Lower Extremity Assessment Lower Extremity Assessment: Defer to PT evaluation   Cervical / Trunk Assessment Cervical / Trunk Assessment: Kyphotic   Communication Communication Communication: No difficulties   Cognition Arousal/Alertness: Awake/alert Behavior During Therapy: WFL for tasks assessed/performed;Flat affect Overall Cognitive Status: No family/caregiver present to determine baseline cognitive functioning Area of Impairment: Memory;Following commands;Safety/judgement;Awareness;Problem solving     Memory: Decreased short-term memory Following Commands: Follows one step commands with increased time Safety/Judgement: Decreased awareness of safety;Decreased awareness of deficits Awareness: Intellectual Problem Solving: Slow processing;Decreased initiation;Difficulty sequencing;Requires verbal cues;Requires tactile cues                Home Living Family/patient expects to be discharged to:: Private residence Living Arrangements: Alone Available Help at Discharge: Family;Available PRN/intermittently Type of Home: Apartment Home Access: Level entry     Home Layout: One level     Bathroom Shower/Tub:  ("walk-in-tub")   Bathroom Toilet:  ("not that tall")     Home Equipment: Environmental consultant - 2 wheels;Cane - single point;Bedside commode;Grab bars - toilet   Additional Comments: uses cane at home vs RW      Prior Functioning/Environment Level of Independence: Needs assistance  Gait / Transfers Assistance Needed: Used RW and cane.  "I didn't use it when I didn't need it" ADL's / Homemaking Assistance Needed: Says she takes a shower by herself at times.   Daughter in law helps "if I need her to"        OT Diagnosis: Generalized weakness;Acute pain;Cognitive deficits   OT Problem List: Decreased strength;Decreased activity tolerance;Impaired balance (sitting and/or standing);Decreased cognition;Decreased safety awareness;Decreased knowledge of use of DME or AE;Decreased knowledge of precautions;Pain   OT Treatment/Interventions: Self-care/ADL training;Therapeutic exercise;Energy conservation;DME and/or AE instruction;Therapeutic activities;Patient/family education;Balance training    OT Goals(Current goals can be found in the care plan section) Acute Rehab OT Goals Patient Stated Goal: go home OT Goal Formulation: With patient (however, pt with poor awareness) Time For Goal Achievement: 03/07/16 Potential to Achieve Goals: Fair ADL Goals Pt Will Perform Grooming: with supervision;sitting Pt Will Perform Lower Body Bathing: with min assist;sit to/from stand Pt Will Perform Lower Body Dressing: with min assist;sit to/from stand Pt Will Transfer to Toilet: with min assist;stand pivot transfer;bedside commode Additional ADL Goal #1: Pt will be min assist with functional mobility during ADL using LRAD  OT Frequency: Min 2X/week   Barriers to D/C: Decreased caregiver support   End of Session Equipment Utilized During Treatment: Gait belt;Rolling walker Nurse Communication: Mobility status;Other (comment) (urine incontinence )  Activity Tolerance: Patient tolerated treatment well Patient left: in chair;with call bell/phone within reach;with chair alarm set   Time: [id number] OT Time Calculation (min): 32 min Charges:  OT General Charges $OT Visit: 1 Procedure OT Evaluation $OT Eval Moderate Complexity: 1 Procedure Co-Treat with PT  OT focusing on ADL and functional mobility  Chrys Racer , MS, OTR/L, CLT Pager: [phone]  02/29/2016, 2:09 PM

## 2016-02-29 NOTE — Progress Notes (Signed)
Nutrition Brief Note  Patient identified on the Malnutrition Screening Tool (MST) Report  Wt Readings from Last 15 Encounters:  02/29/16 155 lb (70.308 kg)  12/13/15 160 lb (72.576 kg)  11/14/15 162 lb 3.2 oz (73.573 kg)  07/31/14 153 lb 3.2 oz (69.491 kg)  05/17/11 164 lb (74.39 kg)  05/11/11 164 lb (74.39 kg)  11/03/09 171 lb (77.565 kg)    Body mass index is 26.59 kg/(m^2). Patient meets criteria for Overweight based on current BMI.   Current diet order is Heart Healthy/Carb Modified, patient is consuming approximately 75-100% of meals at this time. Pt reports having a good to fair appetite and eating well. She reports that she usually weighs 160 lbs. She appears well-nourished per nutrition-focused physical exam. Denies any nutrition related questions or concerns at this time. Labs and medications reviewed.   No nutrition interventions warranted at this time. If nutrition issues arise, please consult RD.   Courtney Bradford RD, LDN Inpatient Clinical Dietitian Pager: [phone] After Hours Pager: [phone]

## 2016-02-29 NOTE — Evaluation (Signed)
Physical Therapy Evaluation Patient Details Name: Courtney Bradford MRN: [id number] DOB: December 03, 1930 Today's Date: 02/29/2016   History of Present Illness  Courtney Bradford is a 82 y.o. female with medical history significant of hypertension, diabetes mellitus, gout, atrial fibrillation on Eliquis, diastolic congestive heart failure, anemia, arthritis, chronic kidney disease-stage III, recurrent UTI, who presents with generalized weakness, bilateral leg edema and confusion.    Clinical Impression  Pt admitted with above diagnosis. Pt currently with functional limitations due to the deficits listed below (see PT Problem List). [name] presents w/ generalized weakness, impaired balance, and decreased awareness of her deficits.  She lives at home alone w/ assist from her daughter in law.  As pt currently requires mod +2 assist for safe transfers, recommending SNF at d/c. Pt will benefit from skilled PT to increase their independence and safety with mobility to allow discharge to the venue listed below.      Follow Up Recommendations SNF;Supervision/Assistance - 24 hour    Equipment Recommendations  Other (comment) (TBD at next venue of care)    Recommendations for Other Services       Precautions / Restrictions Precautions Precautions: Fall Restrictions Weight Bearing Restrictions: No      Mobility  Bed Mobility Overal bed mobility: Needs Assistance Bed Mobility: Supine to Sit     Supine to sit: Min guard     General bed mobility comments: Increased time needed with HOB slightly elevated and min use of bed rails   Transfers Overall transfer level: Needs assistance Equipment used: Rolling walker (2 wheeled) Transfers: Stand Pivot Transfers;Sit to/from Stand Sit to Stand: Mod assist;+2 physical assistance;+2 safety/equipment Stand pivot transfers: Mod assist;+2 physical assistance;+2 safety/equipment       General transfer comment: Pt with posterior lean and flexed posture so  much to where she ended up sitting back down within ~20 seconds of standing. Multimodal cueing needed for upright posture and encouragement to keep standing.  Braces legs on side of bed to achieve standing.  Urinates during sit>stand but does not realize that she has.  Ambulation/Gait                Stairs            Wheelchair Mobility    Modified Rankin (Stroke Patients Only)       Balance Overall balance assessment: Needs assistance Sitting-balance support: No upper extremity supported;Feet supported Sitting balance-Leahy Scale: Good     Standing balance support: Bilateral upper extremity supported;During functional activity Standing balance-Leahy Scale: Poor Standing balance comment: Pt with unsafe RW awareness and safety, RW up off ground when pt attempting to pivot.                              Pertinent Vitals/Pain Pain Assessment: Faces Faces Pain Scale: Hurts even more Pain Location: knees during mobility Pain Descriptors / Indicators: Aching;Moaning;Grimacing;Guarding Pain Intervention(s): Limited activity within patient's tolerance;Monitored during session;Repositioned    Home Living Family/patient expects to be discharged to:: Private residence Living Arrangements: Alone Available Help at Discharge: Family;Available PRN/intermittently Type of Home: Apartment Home Access: Level entry     Home Layout: One level Home Equipment: Walker - 2 wheels;Cane - single point;Bedside commode;Grab bars - toilet Additional Comments: uses cane at home vs RW    Prior Function Level of Independence: Needs assistance   Gait / Transfers Assistance Needed: Used RW and cane.  "I didn't use it when I didn't  need it"  ADL's / Homemaking Assistance Needed: Says she takes a shower by herself at times.  Daughter in law helps "if I need her to"        Hand Dominance   Dominant Hand: Right    Extremity/Trunk Assessment   Upper Extremity Assessment:  Defer to OT evaluation           Lower Extremity Assessment: Generalized weakness      Cervical / Trunk Assessment: Kyphotic  Communication   Communication: No difficulties  Cognition Arousal/Alertness: Awake/alert Behavior During Therapy: WFL for tasks assessed/performed;Flat affect Overall Cognitive Status: No family/caregiver present to determine baseline cognitive functioning Area of Impairment: Memory;Following commands;Safety/judgement;Awareness;Problem solving     Memory: Decreased short-term memory Following Commands: Follows one step commands with increased time Safety/Judgement: Decreased awareness of safety;Decreased awareness of deficits Awareness: Intellectual Problem Solving: Slow processing;Decreased initiation;Difficulty sequencing;Requires verbal cues;Requires tactile cues      General Comments      Exercises        Assessment/Plan    PT Assessment Patient needs continued PT services  PT Diagnosis Difficulty walking;Generalized weakness   PT Problem List Decreased strength;Decreased activity tolerance;Decreased balance;Decreased mobility;Decreased cognition;Decreased knowledge of use of DME;Decreased safety awareness;Decreased knowledge of precautions;Pain  PT Treatment Interventions DME instruction;Gait training;Functional mobility training;Therapeutic activities;Therapeutic exercise;Balance training;Cognitive remediation;Patient/family education   PT Goals (Current goals can be found in the Care Plan section) Acute Rehab PT Goals Patient Stated Goal: go home PT Goal Formulation: With patient Time For Goal Achievement: 03/14/16 Potential to Achieve Goals: Fair    Frequency Min 3X/week   Barriers to discharge Decreased caregiver support Lives alone    Co-evaluation PT/OT/SLP Co-Evaluation/Treatment: Yes Reason for Co-Treatment: For patient/therapist safety;Complexity of the patient's impairments (multi-system involvement) PT goals addressed  during session: Mobility/safety with mobility;Balance;Proper use of DME;Strengthening/ROM OT goals addressed during session: ADL's and self-care;Other (comment) (functional mobility)       End of Session Equipment Utilized During Treatment: Gait belt Activity Tolerance: Patient limited by fatigue Patient left: in chair;with call bell/phone within reach;with chair alarm set Nurse Communication: Mobility status         Time: 1209-1238 PT Time Calculation (min) (ACUTE ONLY): 29 min   Charges:   PT Evaluation $PT Eval Moderate Complexity: 1 Procedure     PT G Codes:       Collie Siad PT, DPT  Pager: [phone] Phone: [phone] 02/29/2016, 3:29 PM

## 2016-02-29 NOTE — NC FL2 (Signed)
[place] LEVEL OF CARE SCREENING TOOL     IDENTIFICATION  Patient Name: Courtney Bradford Birthdate: 20-Apr-1931 Sex: female Admission Date (Current Location): 02/28/2016  [facility] and [place] Number:  [employer] and Address:  The [place], [place] [address], [place], [place] [address]      Provider Number: [id number]  Attending Physician Name and Address:  Velvet Bathe, MD  Relative Name and Phone Number:       Current Level of Care: Hospital Recommended Level of Care: [place] Prior Approval Number:    Date Approved/Denied:   PASRR Number: [id number]  Discharge Plan: SNF    Current Diagnoses: Patient Active Problem List   Diagnosis Date Noted  . Acute encephalopathy 02/28/2016  . UTI (lower urinary tract infection)   . Generalized weakness 11/11/2015  . Acute renal failure superimposed on stage 3 chronic kidney disease ([place]) 11/11/2015  . Hyperkalemia 11/11/2015  . A-fib ([place]) 11/11/2015  . Hypokalemia 11/11/2015  . Gout   . UTI (urinary tract infection) 07/23/2014  . Acute on chronic diastolic heart failure ([place]) 07/22/2014  . Atrial fibrillation, chronic ([place]) 07/22/2014  . Diabetes mellitus type 2, controlled ([place]) 07/22/2014  . Leg pain, bilateral 07/22/2014  . SUI (stress urinary incontinence, female) 05/16/2011  . Pelvic relaxation 05/16/2011  . ANEMIA 11/03/2009  . Essential hypertension 11/03/2009  . PREMATURE VENTRICULAR CONTRACTIONS 11/03/2009  . DYSPNEA 11/03/2009    Orientation RESPIRATION BLADDER Height & Weight     Self, Place  Normal Incontinent Weight: 155 lb (70.308 kg) Height:  5\' 4"  (162.6 cm)  BEHAVIORAL SYMPTOMS/MOOD NEUROLOGICAL BOWEL NUTRITION STATUS   (None)  (None) Continent Diet (Heart healthy, carb-modified)  AMBULATORY STATUS COMMUNICATION OF NEEDS Skin   Extensive Assist Verbally Normal                       Personal Care Assistance Level of Assistance   Bathing, Feeding, Dressing Bathing Assistance: Limited assistance Feeding assistance: Independent Dressing Assistance: Limited assistance     Functional Limitations Info  Sight, Hearing, Speech Sight Info: Adequate Hearing Info: Adequate Speech Info: Adequate    SPECIAL CARE FACTORS FREQUENCY  Blood pressure, Diabetic urine testing, PT (By licensed PT), OT (By licensed OT)     PT Frequency: 5 x week OT Frequency: 5 x week            Contractures Contractures Info: Not present    Additional Factors Info  Code Status, Allergies Code Status Info: Full Allergies Info: Lactose Intolerance           Current Medications (02/29/2016):  This is the current hospital active medication list Current Facility-Administered Medications  Medication Dose Route Frequency Provider Last Rate Last Dose  . 0.9 %  sodium chloride infusion  250 mL Intravenous PRN Ivor Costa, MD      . acetaminophen (TYLENOL) tablet 650 mg  650 mg Oral Q4H PRN Ivor Costa, MD   650 mg at 02/29/16 1029  . amLODipine (NORVASC) tablet 5 mg  5 mg Oral Daily Ivor Costa, MD   5 mg at 02/29/16 1029  . apixaban (ELIQUIS) tablet 2.5 mg  2.5 mg Oral BID Ivor Costa, MD   2.5 mg at 02/29/16 1029  . cefTRIAXone (ROCEPHIN) 1 g in dextrose 5 % 50 mL IVPB  1 g Intravenous Q24H Ivor Costa, MD      . Derrill Memo ON 03/03/2016] cloNIDine (CATAPRES - Dosed in mg/24 hr) patch  0.3 mg  0.3 mg Transdermal Q Fri Ivor Costa, MD      . colchicine tablet 0.6 mg  0.6 mg Oral Daily Ivor Costa, MD   0.6 mg at 02/29/16 1029  . ferrous fumarate (HEMOCYTE - 106 mg FE) tablet 106 mg of iron  1 tablet Oral q morning - 10a Ivor Costa, MD   106 mg of iron at 02/29/16 1030  . furosemide (LASIX) injection 40 mg  40 mg Intravenous Q12H Ivor Costa, MD   40 mg at 02/29/16 1030  . hydrALAZINE (APRESOLINE) injection 5 mg  5 mg Intravenous Q2H PRN Ivor Costa, MD      . insulin aspart (novoLOG) injection 0-5 Units  0-5 Units Subcutaneous QHS Ivor Costa, MD   0 Units at 02/28/16  2345  . insulin aspart (novoLOG) injection 0-9 Units  0-9 Units Subcutaneous TID WC Ivor Costa, MD   2 Units at 02/29/16 1200  . metoprolol tartrate (LOPRESSOR) 25 mg/10 mL oral suspension 6.25 mg  6.25 mg Oral Daily Ivor Costa, MD   6.25 mg at 02/29/16 1030  . olopatadine (PATANOL) 0.1 % ophthalmic solution 1 drop  1 drop Both Eyes BID Ivor Costa, MD   1 drop at 02/29/16 1031  . ondansetron (ZOFRAN) injection 4 mg  4 mg Intravenous Q6H PRN Ivor Costa, MD      . sodium chloride flush (NS) 0.9 % injection 3 mL  3 mL Intravenous Q12H Ivor Costa, MD   3 mL at 02/29/16 1039  . sodium chloride flush (NS) 0.9 % injection 3 mL  3 mL Intravenous PRN Ivor Costa, MD         Discharge Medications: Please see discharge summary for a list of discharge medications.  Relevant Imaging Results:  Relevant Lab Results:   Additional Information SS#: [id number]  Candie Chroman, LCSW

## 2016-02-29 NOTE — Progress Notes (Signed)
Echocardiogram 2D Echocardiogram has been performed.  Courtney Bradford 02/29/2016, 4:42 PM

## 2016-02-29 NOTE — Clinical Social Work Note (Signed)
Clinical Social Work Assessment  Patient Details  Name: Courtney Bradford MRN: [id number] Date of Birth: [birth year]  Date of referral:  02/29/16               Reason for consult:  Facility Placement, Discharge Planning                Permission sought to share information with:  Facility Sport and exercise psychologist, Family Supports Permission granted to share information::  Yes, Verbal Permission Granted  Name::     Courtney Bradford  Agency::  SNF  Relationship::  Son/Healthcare POA  Contact Information:  [phone]  Housing/Transportation Living arrangements for the past 2 months:  Apartment Source of Information:  Power of [place], Adult Children Patient Interpreter Needed:  None Criminal Activity/Legal Involvement Pertinent to Current Situation/Hospitalization:  No - Comment as needed Significant Relationships:  Adult Children Lives with:  Self Do you feel safe going back to the place where you live?  Yes Need for family participation in patient care:  Yes (Comment)  Care giving concerns:  PT recommends SNF placement once medically stable for discharge.   Social Worker assessment / plan:  CSW met with patient. Son/Healthcare POA and another support were at bedside with RNCM. CSW introduced role and explained that discharge planning would be discussed. CSW provided list of SNF's. Patient's son listed preferences for placement. Patient does not want to go but lives alone and her son does not believe she is strong enough to be safe at home. No further concerns. CSW encouraged patient and her family to contact CSW as needed. CSW will continue to follow patient and her family for support and facilitate discharge once medically stable.  Employment status:  Retired Nurse, adult PT Recommendations:  [place] / Referral to community resources:  [place]  Patient/Family's Response to care:  Patient not fully oriented.  Patient's son agreeable to SNF placement following discharge. Patient's family involved in patient's care and supportive. Patient and family polite and appreciated social work intervention.  Patient/Family's Understanding of and Emotional Response to Diagnosis, Current Treatment, and Prognosis:  Patient's son knowledgeable of medical interventions and aware of recommendation for discharge to SNF once medically stable.  Emotional Assessment Appearance:  Appears stated age Attitude/Demeanor/Rapport:  Apprehensive Affect (typically observed):  Calm, Pleasant, Guarded Orientation:  Oriented to Self, Oriented to Place Alcohol / Substance use:  Never Used Psych involvement (Current and /or in the community):  No (Comment)  Discharge Needs  Concerns to be addressed:  Care Coordination Readmission within the last 30 days:  No Current discharge risk:  Dependent with Mobility, Lives alone Barriers to Discharge:  No Barriers Identified   Candie Chroman, LCSW 02/29/2016, 3:50 PM

## 2016-02-29 NOTE — Clinical Social Work Placement (Signed)
   CLINICAL SOCIAL WORK PLACEMENT  NOTE  Date:  02/29/2016  Patient Details  Name: Courtney Bradford MRN: [id number] Date of Birth: 04/19/1931  Clinical Social Work is seeking post-discharge placement for this patient at the [place] level of care (*CSW will initial, date and re-position this form in  chart as items are completed):  Yes   Patient/family provided with [facility] Work Department's list of facilities offering this level of care within the geographic area requested by the patient (or if unable, by the patient's family).  Yes   Patient/family informed of their freedom to choose among providers that offer the needed level of care, that participate in Medicare, Medicaid or managed care program needed by the patient, have an available bed and are willing to accept the patient.  Yes   Patient/family informed of [facility]'s ownership interest in [facility] and [facility], as well as of the fact that they are under no obligation to receive care at these facilities.  PASRR submitted to EDS on       PASRR number received on       Existing PASRR number confirmed on 02/29/16     FL2 transmitted to all facilities in geographic area requested by pt/family on 02/29/16     FL2 transmitted to all facilities within larger geographic area on       Patient informed that his/her managed care company has contracts with or will negotiate with certain facilities, including the following:            Patient/family informed of bed offers received.  Patient chooses bed at       Physician recommends and patient chooses bed at      Patient to be transferred to   on  .  Patient to be transferred to facility by       Patient family notified on   of transfer.  Name of family member notified:        PHYSICIAN       Additional Comment:    _______________________________________________ Candie Chroman, LCSW 02/29/2016, 3:57 PM

## 2016-02-29 NOTE — Care Management Note (Signed)
Case Management Note  Patient Details  Name: Courtney Bradford MRN: [id number] Date of Birth: 01/28/1931  Subjective/Objective:            Admitted with Acute on Chronic CHF        Action/Plan: Patient lives at home alone with private assistance during the day. CM and SW talked to patient with son [name]) present for DCP. Patient's son stated that he cannot take care of her at home especially at night and she needs rehab. Ron requested the [facility] as their first choice.  Expected Discharge Date:     Possibly 03/02/2016             Expected Discharge Plan:  Skilled Nursing Facility  In-House Referral:   SW  Discharge planning Services  CM Consult  Post Acute Care Choice:    Choice offered to:  Adult Children, Patient Status of Service:  In process, will continue to follow  Royston Bake [place] ,[place] [phone]  02/29/2016, 3:41 PM

## 2016-02-29 NOTE — Progress Notes (Signed)
PROGRESS NOTE                                                                                                                                                                                                             Patient Demographics:    Courtney Bradford, is a 82 y.o. female, DOB - 03-24-1931, [id number]  Admit date - 02/28/2016   Admitting Physician Ivor Costa, MD  Outpatient Primary MD for the patient is Maggie Font, MD  LOS -   Outpatient Specialists: Iona Beard, MD   Chief Complaint  Patient presents with  . Leg Swelling  . Fatigue  . Failure To Thrive       Brief Narrative  83 y.o. female with medical history significant of hypertension, diabetes mellitus, gout, atrial fibrillation on Eliquis, diastolic congestive heart failure, anemia, arthritis, chronic kidney disease-stage III, recurrent UTI, who presents with generalized weakness, bilateral leg edema and confusion.    Subjective:    Courtney Bradford today has no new complaints. No acute issues overnight   Assessment  & Plan :    Principal Problem:    Acute encephalopathy - Most likely secondary to infectious etiology - CT of head negative   Acute on chronic diastolic heart failure (HCC) - We'll continue Lasix at current regimen - Continue monitoring intake and output as well as daily weights.  Active Problems:   Essential hypertension   Atrial fibrillation, chronic (HCC) - On eliquis -Continue metoprolol for rate control    Diabetes mellitus type 2, controlled ([place]) - Patient on sliding scale insulin    UTI (urinary tract infection) - Continue Rocephin - Follow-up with cultures    Acute renal failure superimposed on stage 3 chronic kidney disease (HCC) - Serum creatinine trending down on last check   Gout   Hypokalemia - Resolved    Code Status : Full  Family Communication  : Discussed directly with patient  Disposition Plan   : Pending improvement in mentation and urinary tract infection  Barriers For Discharge : Confusion  Consults  :  None  Procedures  : None  DVT Prophylaxis  :  On eliquis  Lab Results  Component Value Date   PLT 464* 02/28/2016    Antibiotics  :  Rocephin  Anti-infectives    Start     Dose/Rate Route Frequency Ordered  Stop   02/29/16 2200  cefTRIAXone (ROCEPHIN) 1 g in dextrose 5 % 50 mL IVPB     1 g 100 mL/hr over 30 Minutes Intravenous Every 24 hours 02/28/16 2149     02/28/16 2100  cefTRIAXone (ROCEPHIN) 1 g in dextrose 5 % 50 mL IVPB     1 g 100 mL/hr over 30 Minutes Intravenous  Once 02/28/16 2052 02/28/16 2223        Objective:   Filed Vitals:   02/28/16 2145 02/28/16 2347 02/29/16 0503 02/29/16 0747  BP: 128/52 124/54 135/51 122/40  Pulse: 67 66 62 61  Temp:  98.9 F (37.2 C) 98.5 F (36.9 C) 99.8 F (37.7 C)  TempSrc:  Oral Oral Oral  Resp: 21 18 18 18   Height:      Weight:   70.308 kg (155 lb)   SpO2: 98% 99% 99% 98%    Wt Readings from Last 3 Encounters:  02/29/16 70.308 kg (155 lb)  12/13/15 72.576 kg (160 lb)  11/14/15 73.573 kg (162 lb 3.2 oz)     Intake/Output Summary (Last 24 hours) at 02/29/16 1450 Last data filed at 02/29/16 1359  Gross per 24 hour  Intake    720 ml  Output    100 ml  Net    620 ml     Physical Exam  Awake Alert, Patient in no acute distress Supple Neck,No JVD, No cervical lymphadenopathy appriciated.  Symmetrical Chest wall movement, decreased breath sounds at bases, no wheezes Irregularly irregular,No Gallops,Rubs  +ve B.Sounds, Abd Soft, No tenderness, No organomegaly appriciated, No rebound - guarding or rigidity. No Cyanosis, Clubbing    Data Review:    CBC  Recent Labs Lab 02/28/16 1713  WBC 8.7  HGB 8.4*  HCT 26.3*  PLT 464*  MCV 81.2  MCH 25.9*  MCHC 31.9  RDW 14.9    Chemistries   Recent Labs Lab 02/28/16 1713 02/29/16 0422  NA 140 143  K 2.9* 3.6  CL 103 108  CO2 21* 21*    GLUCOSE 142* 103*  BUN 77* 67*  CREATININE 2.19* 1.77*  CALCIUM 9.4 9.5  MG  --  1.4*   ------------------------------------------------------------------------------------------------------------------ No results for input(s): CHOL, HDL, LDLCALC, TRIG, CHOLHDL, LDLDIRECT in the last 72 hours.  Lab Results  Component Value Date   HGBA1C 5.3 11/11/2015   ------------------------------------------------------------------------------------------------------------------ No results for input(s): TSH, T4TOTAL, T3FREE, THYROIDAB in the last 72 hours.  Invalid input(s): FREET3 ------------------------------------------------------------------------------------------------------------------ No results for input(s): VITAMINB12, FOLATE, FERRITIN, TIBC, IRON, RETICCTPCT in the last 72 hours.  Coagulation profile  Recent Labs Lab 02/28/16 2218  INR 2.03*    No results for input(s): DDIMER in the last 72 hours.  Cardiac Enzymes  Recent Labs Lab 02/28/16 2218 02/29/16 0422 02/29/16 0901  TROPONINI 0.04* 0.04* 0.03   ------------------------------------------------------------------------------------------------------------------    Component Value Date/Time   BNP 789.2* 02/29/2016 0422    Inpatient Medications  Scheduled Meds: . amLODipine  5 mg Oral Daily  . apixaban  2.5 mg Oral BID  . cefTRIAXone (ROCEPHIN)  IV  1 g Intravenous Q24H  . [START ON 03/03/2016] cloNIDine  0.3 mg Transdermal Q Fri  . colchicine  0.6 mg Oral Daily  . Ferrous Fumarate  1 tablet Oral q morning - 10a  . furosemide  40 mg Intravenous Q12H  . insulin aspart  0-5 Units Subcutaneous QHS  . insulin aspart  0-9 Units Subcutaneous TID WC  . metoprolol tartrate  6.25 mg Oral Daily  .  olopatadine  1 drop Both Eyes BID  . sodium chloride flush  3 mL Intravenous Q12H   Continuous Infusions:  PRN Meds:.sodium chloride, acetaminophen, hydrALAZINE, ondansetron (ZOFRAN) IV, sodium chloride flush  Micro  Results Recent Results (from the past 240 hour(s))  Urine culture     Status: None (Preliminary result)   Collection Time: 02/28/16  7:31 PM  Result Value Ref Range Status   Specimen Description URINE, CATHETERIZED  Final   Special Requests Normal  Final   Culture TOO YOUNG TO READ  Final   Report Status PENDING  Incomplete  Culture, blood (x 2)     Status: None (Preliminary result)   Collection Time: 02/28/16 10:14 PM  Result Value Ref Range Status   Specimen Description BLOOD RIGHT ARM  Final   Special Requests BOTTLES DRAWN AEROBIC AND ANAEROBIC 5ML  Final   Culture NO GROWTH < 12 HOURS  Final   Report Status PENDING  Incomplete  Culture, blood (x 2)     Status: None (Preliminary result)   Collection Time: 02/28/16 10:20 PM  Result Value Ref Range Status   Specimen Description BLOOD RIGHT HAND  Final   Special Requests BOTTLES DRAWN AEROBIC AND ANAEROBIC 5ML  Final   Culture NO GROWTH < 12 HOURS  Final   Report Status PENDING  Incomplete    Radiology Reports Ct Head Wo Contrast  02/29/2016  CLINICAL DATA:  Acute encephalopathy.  Altered mental status. EXAM: CT HEAD WITHOUT CONTRAST TECHNIQUE: Contiguous axial images were obtained from the base of the skull through the vertex without intravenous contrast. COMPARISON:  None. FINDINGS: Diffuse cerebral atrophy. Mild ventricular dilatation consistent with central atrophy. Low-attenuation changes diffusely throughout the deep white matter are nonspecific but likely represent small vessel ischemia. No mass effect or midline shift. No abnormal extra-axial fluid collections. Gray-white matter junctions are distinct. Basal cisterns are not effaced. No evidence of acute intracranial hemorrhage. No depressed skull fractures. Visualized paranasal sinuses and mastoid air cells are not opacified. Vascular calcifications. IMPRESSION: No acute intracranial abnormalities. Chronic atrophy and small vessel ischemic changes. Electronically Signed   By:  Lucienne Capers M.D.   On: 02/29/2016 00:57    Time Spent in minutes  25   Velvet Bathe M.D on 02/29/2016 at 2:50 PM  Between 7am to 7pm - Pager - [phone]  After 7pm go to [email or web address] - password [facility]  [facility] -  Office  [phone]

## 2016-03-01 DIAGNOSIS — I5033 Acute on chronic diastolic (congestive) heart failure: Secondary | ICD-10-CM | POA: Diagnosis not present

## 2016-03-01 DIAGNOSIS — I481 Persistent atrial fibrillation: Secondary | ICD-10-CM | POA: Diagnosis not present

## 2016-03-01 LAB — CBC
HEMATOCRIT: 22.7 % — AB (ref 36.0–46.0)
Hemoglobin: 7.5 g/dL — ABNORMAL LOW (ref 12.0–15.0)
MCH: 27.6 pg (ref 26.0–34.0)
MCHC: 33 g/dL (ref 30.0–36.0)
MCV: 83.5 fL (ref 78.0–100.0)
Platelets: 416 10*3/uL — ABNORMAL HIGH (ref 150–400)
RBC: 2.72 MIL/uL — ABNORMAL LOW (ref 3.87–5.11)
RDW: 15.1 % (ref 11.5–15.5)
WBC: 9.1 10*3/uL (ref 4.0–10.5)

## 2016-03-01 LAB — BASIC METABOLIC PANEL
Anion gap: 14 (ref 5–15)
BUN: 65 mg/dL — AB (ref 6–20)
CHLORIDE: 102 mmol/L (ref 101–111)
CO2: 23 mmol/L (ref 22–32)
Calcium: 9.2 mg/dL (ref 8.9–10.3)
Creatinine, Ser: 1.79 mg/dL — ABNORMAL HIGH (ref 0.44–1.00)
GFR calc Af Amer: 29 mL/min — ABNORMAL LOW (ref >60)
GFR calc non Af Amer: 25 mL/min — ABNORMAL LOW (ref >60)
GLUCOSE: 143 mg/dL — AB (ref 65–99)
POTASSIUM: 3.9 mmol/L (ref 3.5–5.1)
Sodium: 139 mmol/L (ref 135–145)

## 2016-03-01 LAB — GLUCOSE, CAPILLARY
GLUCOSE-CAPILLARY: 119 mg/dL — AB (ref 65–99)
GLUCOSE-CAPILLARY: 132 mg/dL — AB (ref 65–99)
Glucose-Capillary: 104 mg/dL — ABNORMAL HIGH (ref 65–99)
Glucose-Capillary: 230 mg/dL — ABNORMAL HIGH (ref 65–99)

## 2016-03-01 LAB — UREA NITROGEN, URINE: Urea Nitrogen, Ur: 387 mg/dL

## 2016-03-01 NOTE — Consult Note (Signed)
   [facility] CM Inpatient Consult   03/01/2016  Courtney Bradford [birth year] [id number] Patient screened for potential [place] Management services. Patient is eligible for [place] through her [facility]. . Electronic medical record reveals patient's discharge plan is currently for a skilled nursing facility for rehab.  With this disposition, there were no identifiable [facility] care management community needs at this time. A [facility] Care Management brochure and contact information was given to her son, Ron for post facility follow up if needed.  If patient's post hospital needs change please place a [facility] Care Management consult. For questions please contact:   Natividad Brood, RN BSN [facility] Liaison  [phone] business mobile phone Toll free office [phone]

## 2016-03-01 NOTE — Progress Notes (Signed)
PROGRESS NOTE  Courtney Bradford [id number] DOB: 17-Apr-1931 DOA: 02/28/2016 PCP: Maggie Font, MD  HPI/Recap of past 24 hours: [id number] with hx diastolic heart failure admitted with AMS, weakness and bilateral leg edema. She is improving on IV rocephin and with IV lasix diuresis.   Assessment/Plan: Acute encephalopathy - Most likely secondary to infectious etiology - CT of head negative - improving with IV abx  Acute on chronic diastolic heart failure (HCC) - will continue Lasix at current regimen - Continue monitoring intake and output as well as daily weights.  Active Problems:  Essential hypertension  Atrial fibrillation, chronic (HCC) - On eliquis -Continue metoprolol for rate control   Diabetes mellitus type 2, controlled ([place]) - Patient on sliding scale insulin   UTI (urinary tract infection) - Continue Rocephin - Follow-up with cultures   Acute renal failure superimposed on stage 3 chronic kidney disease (HCC) - Serum creatinine trending down on last check  Gout  Hypokalemia - Resolved  Code Status: FULL   Family Communication: Niece at bedside   Disposition Plan: Likely to SNF per family. Discussed with MSW as well.    Consultants:  None   Procedures:  Echo 5/2   Antimicrobials:  Rocephin started 5/1.    Objective: Filed Vitals:   02/29/16 2057 03/01/16 0030 03/01/16 0614 03/01/16 0921  BP: 120/81 129/59 120/49 116/48  Pulse: 66 67 57 66  Temp: 98.9 F (37.2 C) 98.7 F (37.1 C) 98.3 F (36.8 C)   TempSrc: Oral Oral Oral   Resp: 18 18 18    Height:      Weight:   69.4 kg (153 lb)   SpO2: 100% 98% 100%     Intake/Output Summary (Last 24 hours) at 03/01/16 1148 Last data filed at 03/01/16 0909  Gross per 24 hour  Intake    720 ml  Output      0 ml  Net    720 ml   Filed Weights   02/28/16 1656 02/29/16 0503 03/01/16 0614  Weight: 73.029 kg (161 lb) 70.308 kg (155 lb) 69.4 kg (153 lb)    Exam: Awake Alert, Patient in no  acute distress. Niece at bedside. Supple Neck,No JVD, No cervical lymphadenopathy appriciated.  Symmetrical Chest wall movement, decreased breath sounds at bases, no wheezes Irregularly irregular,No Gallops,Rubs  +ve B.Sounds, Abd Soft, No tenderness, No organomegaly appriciated, No rebound - guarding or rigidity. No Cyanosis, Clubbing Minimal LE edema, though 1+ in legs.  Data Reviewed: CBC:  Recent Labs Lab 02/28/16 1713 03/01/16 0232  WBC 8.7 9.1  HGB 8.4* 7.5*  HCT 26.3* 22.7*  MCV 81.2 83.5  PLT 464* 123456*   Basic Metabolic Panel:  Recent Labs Lab 02/28/16 1713 02/29/16 0422 03/01/16 0229  NA 140 143 139  K 2.9* 3.6 3.9  CL 103 108 102  CO2 21* 21* 23  GLUCOSE 142* 103* 143*  BUN 77* 67* 65*  CREATININE 2.19* 1.77* 1.79*  CALCIUM 9.4 9.5 9.2  MG  --  1.4*  --    GFR: Estimated Creatinine Clearance: 23.2 mL/min (by C-G formula based on Cr of 1.79). Liver Function Tests: No results for input(s): AST, ALT, ALKPHOS, BILITOT, PROT, ALBUMIN in the last 168 hours. No results for input(s): LIPASE, AMYLASE in the last 168 hours. No results for input(s): AMMONIA in the last 168 hours. Coagulation Profile:  Recent Labs Lab 02/28/16 2218  INR 2.03*   Cardiac Enzymes:  Recent Labs Lab 02/28/16 2218 02/29/16 0422 02/29/16 0901  TROPONINI 0.04* 0.04* 0.03   BNP (last 3 results) No results for input(s): PROBNP in the last 8760 hours. HbA1C: No results for input(s): HGBA1C in the last 72 hours. CBG:  Recent Labs Lab 02/29/16 0558 02/29/16 1138 02/29/16 1705 02/29/16 2158 03/01/16 0659  GLUCAP 109* 173* 160* 196* 104*   Lipid Profile: No results for input(s): CHOL, HDL, LDLCALC, TRIG, CHOLHDL, LDLDIRECT in the last 72 hours. Thyroid Function Tests: No results for input(s): TSH, T4TOTAL, FREET4, T3FREE, THYROIDAB in the last 72 hours. Anemia Panel: No results for input(s): VITAMINB12, FOLATE, FERRITIN, TIBC, IRON, RETICCTPCT in the last 72  hours. Urine analysis:    Component Value Date/Time   COLORURINE YELLOW 02/28/2016 1931   APPEARANCEUR CLOUDY* 02/28/2016 1931   LABSPEC 1.013 02/28/2016 1931   PHURINE 5.5 02/28/2016 1931   GLUCOSEU NEGATIVE 02/28/2016 1931   HGBUR TRACE* 02/28/2016 1931   BILIRUBINUR NEGATIVE 02/28/2016 1931   KETONESUR NEGATIVE 02/28/2016 1931   PROTEINUR NEGATIVE 02/28/2016 1931   UROBILINOGEN 1.0 07/22/2014 0300   NITRITE NEGATIVE 02/28/2016 1931   LEUKOCYTESUR MODERATE* 02/28/2016 1931   Sepsis Labs: @LABRCNTIP (procalcitonin:4,lacticidven:4)  ) Recent Results (from the past 240 hour(s))  Urine culture     Status: None (Preliminary result)   Collection Time: 02/28/16  7:31 PM  Result Value Ref Range Status   Specimen Description URINE, CATHETERIZED  Final   Special Requests Normal  Final   Culture TOO YOUNG TO READ  Final   Report Status PENDING  Incomplete  Culture, blood (x 2)     Status: None (Preliminary result)   Collection Time: 02/28/16 10:14 PM  Result Value Ref Range Status   Specimen Description BLOOD RIGHT ARM  Final   Special Requests BOTTLES DRAWN AEROBIC AND ANAEROBIC 5ML  Final   Culture NO GROWTH < 12 HOURS  Final   Report Status PENDING  Incomplete  Culture, blood (x 2)     Status: None (Preliminary result)   Collection Time: 02/28/16 10:20 PM  Result Value Ref Range Status   Specimen Description BLOOD RIGHT HAND  Final   Special Requests BOTTLES DRAWN AEROBIC AND ANAEROBIC 5ML  Final   Culture NO GROWTH < 12 HOURS  Final   Report Status PENDING  Incomplete      Studies: No results found.  Scheduled Meds: . amLODipine  5 mg Oral Daily  . apixaban  2.5 mg Oral BID  . cefTRIAXone (ROCEPHIN)  IV  1 g Intravenous Q24H  . [START ON 03/03/2016] cloNIDine  0.3 mg Transdermal Q Fri  . colchicine  0.6 mg Oral Daily  . Ferrous Fumarate  1 tablet Oral q morning - 10a  . furosemide  40 mg Intravenous Q12H  . insulin aspart  0-5 Units Subcutaneous QHS  . insulin aspart   0-9 Units Subcutaneous TID WC  . metoprolol tartrate  6.25 mg Oral Daily  . olopatadine  1 drop Both Eyes BID  . sodium chloride flush  3 mL Intravenous Q12H    Continuous Infusions:      Time spent: 65  Kaidan Spengler Marry Guan, MD [facility] Pager [phone]  If 7PM-7AM, please contact night-coverage [email or web address] Password [email or web address] 03/01/2016, 11:48 AM

## 2016-03-02 DIAGNOSIS — Z79899 Other long term (current) drug therapy: Secondary | ICD-10-CM | POA: Diagnosis not present

## 2016-03-02 DIAGNOSIS — R609 Edema, unspecified: Secondary | ICD-10-CM | POA: Diagnosis present

## 2016-03-02 DIAGNOSIS — E1122 Type 2 diabetes mellitus with diabetic chronic kidney disease: Secondary | ICD-10-CM | POA: Diagnosis present

## 2016-03-02 DIAGNOSIS — M109 Gout, unspecified: Secondary | ICD-10-CM | POA: Diagnosis present

## 2016-03-02 DIAGNOSIS — I5033 Acute on chronic diastolic (congestive) heart failure: Secondary | ICD-10-CM | POA: Diagnosis not present

## 2016-03-02 DIAGNOSIS — G9349 Other encephalopathy: Secondary | ICD-10-CM | POA: Diagnosis present

## 2016-03-02 DIAGNOSIS — E876 Hypokalemia: Secondary | ICD-10-CM | POA: Diagnosis present

## 2016-03-02 DIAGNOSIS — I481 Persistent atrial fibrillation: Secondary | ICD-10-CM | POA: Diagnosis not present

## 2016-03-02 DIAGNOSIS — R627 Adult failure to thrive: Secondary | ICD-10-CM | POA: Diagnosis present

## 2016-03-02 DIAGNOSIS — N179 Acute kidney failure, unspecified: Secondary | ICD-10-CM | POA: Diagnosis present

## 2016-03-02 DIAGNOSIS — I130 Hypertensive heart and chronic kidney disease with heart failure and stage 1 through stage 4 chronic kidney disease, or unspecified chronic kidney disease: Secondary | ICD-10-CM | POA: Diagnosis present

## 2016-03-02 DIAGNOSIS — N390 Urinary tract infection, site not specified: Secondary | ICD-10-CM | POA: Diagnosis present

## 2016-03-02 DIAGNOSIS — Z794 Long term (current) use of insulin: Secondary | ICD-10-CM | POA: Diagnosis not present

## 2016-03-02 DIAGNOSIS — Z7901 Long term (current) use of anticoagulants: Secondary | ICD-10-CM | POA: Diagnosis not present

## 2016-03-02 DIAGNOSIS — Z7982 Long term (current) use of aspirin: Secondary | ICD-10-CM | POA: Diagnosis not present

## 2016-03-02 DIAGNOSIS — N183 Chronic kidney disease, stage 3 (moderate): Secondary | ICD-10-CM | POA: Diagnosis present

## 2016-03-02 DIAGNOSIS — I482 Chronic atrial fibrillation: Secondary | ICD-10-CM | POA: Diagnosis present

## 2016-03-02 LAB — URINE CULTURE
Culture: >=100000 — AB
Special Requests: NORMAL

## 2016-03-02 LAB — BLOOD CULTURE ID PANEL (REFLEXED)
ACINETOBACTER BAUMANNII: NOT DETECTED
CANDIDA ALBICANS: NOT DETECTED
CANDIDA TROPICALIS: NOT DETECTED
Candida glabrata: NOT DETECTED
Candida krusei: NOT DETECTED
Candida parapsilosis: NOT DETECTED
Carbapenem resistance: NOT DETECTED
ENTEROBACTER CLOACAE COMPLEX: NOT DETECTED
ENTEROCOCCUS SPECIES: NOT DETECTED
Enterobacteriaceae species: NOT DETECTED
Escherichia coli: NOT DETECTED
HAEMOPHILUS INFLUENZAE: NOT DETECTED
Klebsiella oxytoca: NOT DETECTED
Klebsiella pneumoniae: NOT DETECTED
LISTERIA MONOCYTOGENES: NOT DETECTED
METHICILLIN RESISTANCE: NOT DETECTED
NEISSERIA MENINGITIDIS: NOT DETECTED
PROTEUS SPECIES: NOT DETECTED
Pseudomonas aeruginosa: NOT DETECTED
STAPHYLOCOCCUS AUREUS BCID: NOT DETECTED
STREPTOCOCCUS PYOGENES: NOT DETECTED
Serratia marcescens: NOT DETECTED
Staphylococcus species: NOT DETECTED
Streptococcus agalactiae: NOT DETECTED
Streptococcus pneumoniae: NOT DETECTED
Streptococcus species: NOT DETECTED
VANCOMYCIN RESISTANCE: NOT DETECTED

## 2016-03-02 LAB — GLUCOSE, CAPILLARY
GLUCOSE-CAPILLARY: 138 mg/dL — AB (ref 65–99)
Glucose-Capillary: 119 mg/dL — ABNORMAL HIGH (ref 65–99)
Glucose-Capillary: 183 mg/dL — ABNORMAL HIGH (ref 65–99)
Glucose-Capillary: 159 mg/dL — ABNORMAL HIGH (ref 65–99)

## 2016-03-02 MED ORDER — METOPROLOL TARTRATE 25 MG/10 ML ORAL SUSPENSION: 6.2500 mg | Freq: Every day | ORAL | Status: DC

## 2016-03-02 MED ORDER — CEFUROXIME AXETIL 250 MG PO TABS: 250.0000 mg | ORAL_TABLET | Freq: Two times a day (BID) | ORAL | Status: DC

## 2016-03-02 MED ORDER — FUROSEMIDE 40 MG PO TABS
40.0000 mg | ORAL_TABLET | Freq: Every day | ORAL | Status: DC
  Administered 2016-03-02: 40 mg via ORAL
  Filled 2016-03-02: qty 1

## 2016-03-02 NOTE — Progress Notes (Signed)
Pt a/o forgetful, no c/o pain, VSS, pt stable

## 2016-03-02 NOTE — Progress Notes (Signed)
Physical Therapy Treatment Patient Details Name: Courtney Bradford MRN: [id number] DOB: 29-Jul-1931 Today's Date: 03/02/2016    History of Present Illness Courtney Bradford is a 82 y.o. female with medical history significant of hypertension, diabetes mellitus, gout, atrial fibrillation on Eliquis, diastolic congestive heart failure, anemia, arthritis, chronic kidney disease-stage III, recurrent UTI, who presents with generalized weakness, bilateral leg edema and confusion.    PT Comments    Courtney Bradford is performing bed mobility at a min guard level; however w/ sit>stand pt demonstrates ~90 deg flexed posture.  Cues provided for upright posture but pt repeating, "I need to take my time".  Limited session due to pt's decreased awareness of safety w/ mobility.  Pt will benefit from continued skilled PT services to increase functional independence and safety.   Follow Up Recommendations  SNF;Supervision/Assistance - 24 hour     Equipment Recommendations  Other (comment) (TBD at next venue of care)    Recommendations for Other Services       Precautions / Restrictions Precautions Precautions: Fall Restrictions Weight Bearing Restrictions: No    Mobility  Bed Mobility Overal bed mobility: Needs Assistance Bed Mobility: Supine to Sit;Rolling Rolling: Min guard   Supine to sit: Min guard     General bed mobility comments: Increased time needed with HOB slightly elevated  Transfers Overall transfer level: Needs assistance Equipment used: 2 person hand held assist Transfers: Sit to/from [facility] Sit to Stand: Mod assist;+2 physical assistance;+2 safety/equipment Stand pivot transfers: Mod assist;+2 physical assistance;+2 safety/equipment       General transfer comment: Assist to boost up from bed, pt w/ flexed trunk at ~90 deg.  Cues to stand upright; however, pt repeating "I need to take my time".  Pt's hips directed to pivot to chair and assisted w/ controlled  descent to chair.   Ambulation/Gait             General Gait Details: unable to attempt at this time   Stairs            Wheelchair Mobility    Modified Rankin (Stroke Patients Only)       Balance Overall balance assessment: Needs assistance Sitting-balance support: No upper extremity supported;Feet supported Sitting balance-Leahy Scale: Good     Standing balance support: Bilateral upper extremity supported;During functional activity Standing balance-Leahy Scale: Poor Standing balance comment: Flexed posture, pt not attempting to stand upright                    Cognition Arousal/Alertness: Awake/alert Behavior During Therapy: WFL for tasks assessed/performed;Flat affect Overall Cognitive Status: No family/caregiver present to determine baseline cognitive functioning Area of Impairment: Memory;Following commands;Safety/judgement;Awareness;Problem solving;Orientation Orientation Level: Disoriented to;Place;Time;Situation   Memory: Decreased short-term memory Following Commands: Follows one step commands with increased time;Follows one step commands inconsistently Safety/Judgement: Decreased awareness of safety;Decreased awareness of deficits Awareness: Intellectual Problem Solving: Slow processing;Decreased initiation;Difficulty sequencing;Requires verbal cues;Requires tactile cues      Exercises General Exercises - Lower Extremity Ankle Circles/Pumps: AROM;Both;10 reps;Seated Long Arc Quad: AROM;Both;10 reps;Seated    General Comments        Pertinent Vitals/Pain Pain Assessment: Faces Faces Pain Scale: Hurts even more Pain Location: Lt foot sensitive to touch Pain Descriptors / Indicators: Tender Pain Intervention(s): Monitored during session;Limited activity within patient's tolerance;Repositioned    Home Living                      Prior Function  PT Goals (current goals can now be found in the care plan section)  Acute Rehab PT Goals Patient Stated Goal: none stated PT Goal Formulation: With patient Time For Goal Achievement: 03/14/16 Potential to Achieve Goals: Fair Progress towards PT goals: Not progressing toward goals - comment (due to pt's decreased awareness of safety w/ mobility)    Frequency  Min 3X/week    PT Plan Current plan remains appropriate    Co-evaluation             End of Session Equipment Utilized During Treatment: Gait belt Activity Tolerance: Patient limited by fatigue Patient left: in chair;with call bell/phone within reach;with nursing/sitter in room (Nurse tech to set chair alarm once done w/ bath)     Time: [id number] PT Time Calculation (min) (ACUTE ONLY): 16 min  Charges:  $Therapeutic Activity: 8-22 mins                    G Codes:      Collie Siad PT, DPT  Pager: [phone] Phone: [phone] 03/02/2016, 2:41 PM

## 2016-03-02 NOTE — Clinical Social Work Note (Signed)
Spoke with patient's son/POA. He plans to go to [facility] after work to make sure there are no other options for patient getting a bed there. Plan is for patient to discharge tomorrow.  Dayton Scrape, [place]

## 2016-03-02 NOTE — Clinical Social Work Note (Addendum)
CSW called patient's son this morning. Patient's son reports that he called [facility] to ask again about bed availability after patient was declined. Admissions told patient's son that they might have a bed for tomorrow. CSW called [facility] to confirm this. Carrie with admissions stated that it was a possibility and would call CSW back soon. CSW called again at 12:00 as [facility] had not called back. CSW left a voicemail with contact information.  Dayton Scrape, [place] [phone]  CSW spoke with Morey Hummingbird at [facility] center and she stated that they had a facility resident that was supposed to be leaving tomorrow but they are now staying. CSW contacted patient's son and he wanted to know when they would have a bed available because as of right now, his only plan is [facility] or home. CSW called Morey Hummingbird back and she stated that they would not have one until at least middle of next week. CSW called patient's son back and made him aware. He now wants to have a family meeting with the patient's doctor about what steps to take next.  Dayton Scrape, [place]

## 2016-03-02 NOTE — Progress Notes (Signed)
PROGRESS NOTE  Courtney Bradford [id number] DOB: 1931-09-11 DOA: 02/28/2016 PCP: Maggie Font, MD  HPI/Recap of past 24 hours: [id number] with hx diastolic heart failure admitted with AMS, weakness and bilateral leg edema. She is improving on IV rocephin and with IV lasix diuresis. Approaching maximal hospital benefit and disposition planning is ongoing.  Assessment/Plan: Acute encephalopathy - Most likely secondary to infectious etiology - CT of head negative - improved  Acute on chronic diastolic heart failure ([place]) - improved, will place on PO lasix today - Continue monitoring intake and output as well as daily weights.  Active Problems:  Essential hypertension  Atrial fibrillation, chronic (HCC) - On eliquis -Continue metoprolol for rate control   Diabetes mellitus type 2, controlled ([place]) - Patient on sliding scale insulin   UTI (urinary tract infection) - Continue Rocephin - Follow-up with cultures   Acute renal failure superimposed on stage 3 chronic kidney disease (HCC) - Serum creatinine trending down on last check  Gout  Hypokalemia - Resolved  Code Status: FULL   Family Communication: Niece at bedside this AM, also discussed face to face with patient's son this AM.  Disposition Plan: Likely to SNF per family. Discussed with MSW multiple times today.   Consultants:  None   Procedures:  Echo 5/2   Antimicrobials:  Rocephin started 5/1.    Objective: Filed Vitals:   03/01/16 1154 03/01/16 2109 03/02/16 0441 03/02/16 1220  BP: 119/45 132/64 120/49 105/42  Pulse: 53 79 65 63  Temp: 98.6 F (37 C) 98.9 F (37.2 C) 98.6 F (37 C) 98.1 F (36.7 C)  TempSrc: Oral Oral Oral Oral  Resp: 148 16 17 16   Height:      Weight:   69.219 kg (152 lb 9.6 oz)   SpO2: 99% 99% 100% 100%    Intake/Output Summary (Last 24 hours) at 03/02/16 1314 Last data filed at 03/02/16 1022  Gross per 24 hour  Intake    805 ml  Output      0 ml  Net    805 ml    Filed Weights   02/29/16 0503 03/01/16 0614 03/02/16 0441  Weight: 70.308 kg (155 lb) 69.4 kg (153 lb) 69.219 kg (152 lb 9.6 oz)    Exam: Awake Alert, Patient in no acute distress. Niece at bedside. Supple Neck,No JVD, No cervical lymphadenopathy appriciated.  Symmetrical Chest wall movement, decreased breath sounds at bases, no wheezes Irregularly irregular,No Gallops,Rubs  +ve B.Sounds, Abd Soft, No tenderness, No organomegaly appriciated, No rebound - guarding or rigidity. No Cyanosis, Clubbing Minimal LE edema, though 1+ in legs.  Data Reviewed: CBC:  Recent Labs Lab 02/28/16 1713 03/01/16 0232  WBC 8.7 9.1  HGB 8.4* 7.5*  HCT 26.3* 22.7*  MCV 81.2 83.5  PLT 464* 123456*   Basic Metabolic Panel:  Recent Labs Lab 02/28/16 1713 02/29/16 0422 03/01/16 0229  NA 140 143 139  K 2.9* 3.6 3.9  CL 103 108 102  CO2 21* 21* 23  GLUCOSE 142* 103* 143*  BUN 77* 67* 65*  CREATININE 2.19* 1.77* 1.79*  CALCIUM 9.4 9.5 9.2  MG  --  1.4*  --    GFR: Estimated Creatinine Clearance: 23.1 mL/min (by C-G formula based on Cr of 1.79). Liver Function Tests: No results for input(s): AST, ALT, ALKPHOS, BILITOT, PROT, ALBUMIN in the last 168 hours. No results for input(s): LIPASE, AMYLASE in the last 168 hours. No results for input(s): AMMONIA in the last 168 hours. Coagulation Profile:  Recent Labs Lab 02/28/16 2218  INR 2.03*   Cardiac Enzymes:  Recent Labs Lab 02/28/16 2218 02/29/16 0422 02/29/16 0901  TROPONINI 0.04* 0.04* 0.03   BNP (last 3 results) No results for input(s): PROBNP in the last 8760 hours. HbA1C: No results for input(s): HGBA1C in the last 72 hours. CBG:  Recent Labs Lab 03/01/16 1152 03/01/16 1639 03/01/16 2105 03/02/16 0552 03/02/16 1157  GLUCAP 230* 119* 132* 119* 183*   Lipid Profile: No results for input(s): CHOL, HDL, LDLCALC, TRIG, CHOLHDL, LDLDIRECT in the last 72 hours. Thyroid Function Tests: No results for input(s): TSH,  T4TOTAL, FREET4, T3FREE, THYROIDAB in the last 72 hours. Anemia Panel: No results for input(s): VITAMINB12, FOLATE, FERRITIN, TIBC, IRON, RETICCTPCT in the last 72 hours. Urine analysis:    Component Value Date/Time   COLORURINE YELLOW 02/28/2016 1931   APPEARANCEUR CLOUDY* 02/28/2016 1931   LABSPEC 1.013 02/28/2016 1931   PHURINE 5.5 02/28/2016 1931   GLUCOSEU NEGATIVE 02/28/2016 1931   HGBUR TRACE* 02/28/2016 1931   BILIRUBINUR NEGATIVE 02/28/2016 1931   KETONESUR NEGATIVE 02/28/2016 1931   PROTEINUR NEGATIVE 02/28/2016 1931   UROBILINOGEN 1.0 07/22/2014 0300   NITRITE NEGATIVE 02/28/2016 1931   LEUKOCYTESUR MODERATE* 02/28/2016 1931   Sepsis Labs: @LABRCNTIP (procalcitonin:4,lacticidven:4)  ) Recent Results (from the past 240 hour(s))  Urine culture     Status: Abnormal   Collection Time: 02/28/16  7:31 PM  Result Value Ref Range Status   Specimen Description URINE, CATHETERIZED  Final   Special Requests Normal  Final   Culture >=100,000 COLONIES/mL ESCHERICHIA COLI (A)  Final   Report Status 03/02/2016 FINAL  Final   Organism ID, Bacteria ESCHERICHIA COLI (A)  Final      Susceptibility   Escherichia coli - MIC*    AMPICILLIN <=2 SENSITIVE Sensitive     CEFAZOLIN <=4 SENSITIVE Sensitive     CEFTRIAXONE <=1 SENSITIVE Sensitive     CIPROFLOXACIN <=0.25 SENSITIVE Sensitive     GENTAMICIN <=1 SENSITIVE Sensitive     IMIPENEM <=0.25 SENSITIVE Sensitive     NITROFURANTOIN <=16 SENSITIVE Sensitive     TRIMETH/SULFA <=20 SENSITIVE Sensitive     AMPICILLIN/SULBACTAM <=2 SENSITIVE Sensitive     PIP/TAZO <=4 SENSITIVE Sensitive     * >=100,000 COLONIES/mL ESCHERICHIA COLI  Culture, blood (x 2)     Status: None (Preliminary result)   Collection Time: 02/28/16 10:14 PM  Result Value Ref Range Status   Specimen Description BLOOD RIGHT ARM  Final   Special Requests BOTTLES DRAWN AEROBIC AND ANAEROBIC 5ML  Final   Culture  Setup Time   Final    GRAM POSITIVE RODS AEROBIC BOTTLE  ONLY Organism ID to follow CRITICAL RESULT CALLED TO, READ BACK BY AND VERIFIED WITH: CLamont Snowball D AT 0819 ON [id number] BY S. YARBROUGH    Culture TOO YOUNG TO READ  Final   Report Status PENDING  Incomplete  Blood Culture ID Panel (Reflexed)     Status: None   Collection Time: 02/28/16 10:14 PM  Result Value Ref Range Status   Enterococcus species NOT DETECTED NOT DETECTED Final   Vancomycin resistance NOT DETECTED NOT DETECTED Final   Listeria monocytogenes NOT DETECTED NOT DETECTED Final   Staphylococcus species NOT DETECTED NOT DETECTED Final   Staphylococcus aureus NOT DETECTED NOT DETECTED Final   Methicillin resistance NOT DETECTED NOT DETECTED Final   Streptococcus species NOT DETECTED NOT DETECTED Final   Streptococcus agalactiae NOT DETECTED NOT DETECTED Final   Streptococcus pneumoniae NOT  DETECTED NOT DETECTED Final   Streptococcus pyogenes NOT DETECTED NOT DETECTED Final   Acinetobacter baumannii NOT DETECTED NOT DETECTED Final   Enterobacteriaceae species NOT DETECTED NOT DETECTED Final   Enterobacter cloacae complex NOT DETECTED NOT DETECTED Final   Escherichia coli NOT DETECTED NOT DETECTED Final   Klebsiella oxytoca NOT DETECTED NOT DETECTED Final   Klebsiella pneumoniae NOT DETECTED NOT DETECTED Final   Proteus species NOT DETECTED NOT DETECTED Final   Serratia marcescens NOT DETECTED NOT DETECTED Final   Carbapenem resistance NOT DETECTED NOT DETECTED Final   Haemophilus influenzae NOT DETECTED NOT DETECTED Final   Neisseria meningitidis NOT DETECTED NOT DETECTED Final   Pseudomonas aeruginosa NOT DETECTED NOT DETECTED Final   Candida albicans NOT DETECTED NOT DETECTED Final   Candida glabrata NOT DETECTED NOT DETECTED Final   Candida krusei NOT DETECTED NOT DETECTED Final   Candida parapsilosis NOT DETECTED NOT DETECTED Final   Candida tropicalis NOT DETECTED NOT DETECTED Final  Culture, blood (x 2)     Status: None (Preliminary result)   Collection Time:  02/28/16 10:20 PM  Result Value Ref Range Status   Specimen Description BLOOD RIGHT HAND  Final   Special Requests BOTTLES DRAWN AEROBIC AND ANAEROBIC 5ML  Final   Culture NO GROWTH 2 DAYS  Final   Report Status PENDING  Incomplete      Studies: No results found.  Scheduled Meds: . amLODipine  5 mg Oral Daily  . apixaban  2.5 mg Oral BID  . cefTRIAXone (ROCEPHIN)  IV  1 g Intravenous Q24H  . [START ON 03/03/2016] cloNIDine  0.3 mg Transdermal Q Fri  . colchicine  0.6 mg Oral Daily  . Ferrous Fumarate  1 tablet Oral q morning - 10a  . furosemide  40 mg Oral Daily  . insulin aspart  0-5 Units Subcutaneous QHS  . insulin aspart  0-9 Units Subcutaneous TID WC  . metoprolol tartrate  6.25 mg Oral Daily  . olopatadine  1 drop Both Eyes BID  . sodium chloride flush  3 mL Intravenous Q12H    Continuous Infusions:      Time spent: 70  Mir Marry Guan, MD [facility] Pager [phone]  If 7PM-7AM, please contact night-coverage [email or web address] Password [email or web address] 03/02/2016, 1:14 PM

## 2016-03-03 DIAGNOSIS — I481 Persistent atrial fibrillation: Secondary | ICD-10-CM

## 2016-03-03 LAB — BASIC METABOLIC PANEL
Anion gap: 12 (ref 5–15)
BUN: 75 mg/dL — AB (ref 6–20)
CALCIUM: 9.5 mg/dL (ref 8.9–10.3)
CO2: 28 mmol/L (ref 22–32)
CREATININE: 1.85 mg/dL — AB (ref 0.44–1.00)
Chloride: 102 mmol/L (ref 101–111)
GFR calc Af Amer: 28 mL/min — ABNORMAL LOW (ref >60)
GFR, EST NON AFRICAN AMERICAN: 24 mL/min — AB (ref >60)
Glucose, Bld: 112 mg/dL — ABNORMAL HIGH (ref 65–99)
Potassium: 3.6 mmol/L (ref 3.5–5.1)
SODIUM: 142 mmol/L (ref 135–145)

## 2016-03-03 LAB — GLUCOSE, CAPILLARY
GLUCOSE-CAPILLARY: 206 mg/dL — AB (ref 65–99)
Glucose-Capillary: 105 mg/dL — ABNORMAL HIGH (ref 65–99)
Glucose-Capillary: 108 mg/dL — ABNORMAL HIGH (ref 65–99)
Glucose-Capillary: 164 mg/dL — ABNORMAL HIGH (ref 65–99)

## 2016-03-03 LAB — CBC
HCT: 24.3 % — ABNORMAL LOW (ref 36.0–46.0)
Hemoglobin: 7.6 g/dL — ABNORMAL LOW (ref 12.0–15.0)
MCH: 25.9 pg — ABNORMAL LOW (ref 26.0–34.0)
MCHC: 31.3 g/dL (ref 30.0–36.0)
MCV: 82.9 fL (ref 78.0–100.0)
PLATELETS: 541 10*3/uL — AB (ref 150–400)
RBC: 2.93 MIL/uL — ABNORMAL LOW (ref 3.87–5.11)
RDW: 15 % (ref 11.5–15.5)
WBC: 5.3 10*3/uL (ref 4.0–10.5)

## 2016-03-03 MED ORDER — CEFUROXIME AXETIL 500 MG PO TABS
250.0000 mg | ORAL_TABLET | Freq: Two times a day (BID) | ORAL | Status: DC
  Administered 2016-03-03 – 2016-03-05 (×4): 250 mg via ORAL
  Filled 2016-03-03 (×4): qty 1

## 2016-03-03 MED ORDER — FUROSEMIDE 20 MG PO TABS
20.0000 mg | ORAL_TABLET | Freq: Every day | ORAL | Status: DC
  Administered 2016-03-03 – 2016-03-05 (×3): 20 mg via ORAL
  Filled 2016-03-03 (×3): qty 1

## 2016-03-03 NOTE — Progress Notes (Signed)
CM talked to patient's son about DCP; Ron (son) wants patient to be admitted with the [facility] for rehab prior to returning home but they do not have any beds available at this time. CM informed son/ patient about going to [facility] and when a bed is available at the [facility] she can transfer there for continued therapy. Ron does not want her to go anywhere other than the [facility] ( due to all of her family and friends are in [place]). Patient is medically stable for discharge / Attending Md has written a discharge order.  Patient is requesting to go home at this time but is having difficulty walking. Ron is appealing discharge at this time. Case reviewed with Med Director Dr Burnard Bunting, he requested that CM issue a HINN 12 Letter for Non -covered continued stay. Letter issued as requested. Mindi Slicker [facility] [phone]

## 2016-03-03 NOTE — Care Management Important Message (Signed)
Important Message  Patient Details  Name: Courtney Bradford MRN: [id number] Date of Birth: 04/10/1931   Medicare Important Message Given:  Yes    Royston Bake, RN 03/03/2016, 1:13 PM

## 2016-03-03 NOTE — Progress Notes (Addendum)
PROGRESS NOTE  Courtney Bradford [id number] DOB: [birth year] DOA: 02/28/2016 PCP: Maggie Font, MD  HPI/Recap of past 24 hours: [id number] with hx diastolic heart failure admitted with AMS, weakness and bilateral leg edema. She is improving on IV rocephin and with IV lasix diuresis. Ready for dc today, has bed at SNF family is appealing discharge.  Assessment/Plan: Acute encephalopathy - Most likely secondary to infectious etiology - CT of head negative - improved  Acute on chronic diastolic heart failure ([place]) - improved, will place on PO lasix today - Continue monitoring intake and output as well as daily weights.  Active Problems:  Essential hypertension  Atrial fibrillation, chronic (HCC) - On eliquis -Continue metoprolol for rate control   Diabetes mellitus type 2, controlled ([place]) - Patient on sliding scale insulin   UTI (urinary tract infection) - Continue Rocephin - Follow-up with cultures   Acute renal failure superimposed on stage 3 chronic kidney disease (HCC) - Serum creatinine trending down on last check, now bumped a little so will reduce lasix further. Next check on Monday 5/8   Gout  Hypokalemia - Resolved  Code Status: FULL   Family Communication: Daughter in law at bedside this AM.  Disposition Plan: Likely to SNF per family. Discussed with MSW multiple times today.   Consultants:  None   Procedures:  Echo 5/2   Antimicrobials:  Rocephin started 5/1.    Objective: Filed Vitals:   03/02/16 2100 03/03/16 0448 03/03/16 0510 03/03/16 1158  BP: 123/44 118/50  119/44  Pulse: 80 51  56  Temp: 98.3 F (36.8 C) 98.4 F (36.9 C)  98.4 F (36.9 C)  TempSrc: Oral Oral  Oral  Resp: 18 20  18   Height:      Weight:   68.992 kg (152 lb 1.6 oz)   SpO2: 100% 100%  100%    Intake/Output Summary (Last 24 hours) at 03/03/16 1420 Last data filed at 03/03/16 1010  Gross per 24 hour  Intake    655 ml  Output    300 ml  Net    355 ml   Filed  Weights   03/01/16 0614 03/02/16 0441 03/03/16 0510  Weight: 69.4 kg (153 lb) 69.219 kg (152 lb 9.6 oz) 68.992 kg (152 lb 1.6 oz)    Exam: Awake Alert, Patient in no acute distress. Niece at bedside. Supple Neck,No JVD, No cervical lymphadenopathy appriciated.  Symmetrical Chest wall movement, decreased breath sounds at bases, no wheezes Irregularly irregular,No Gallops,Rubs  +ve B.Sounds, Abd Soft, No tenderness, No organomegaly appriciated, No rebound - guarding or rigidity. No Cyanosis, Clubbing Minimal LE edema, though 1+ in legs.  Data Reviewed: CBC:  Recent Labs Lab 02/28/16 1713 03/01/16 0232 03/03/16 0537  WBC 8.7 9.1 5.3  HGB 8.4* 7.5* 7.6*  HCT 26.3* 22.7* 24.3*  MCV 81.2 83.5 82.9  PLT 464* 416* [id number]*   Basic Metabolic Panel:  Recent Labs Lab 02/28/16 1713 02/29/16 0422 03/01/16 0229 03/03/16 0537  NA 140 143 139 142  K 2.9* 3.6 3.9 3.6  CL 103 108 102 102  CO2 21* 21* 23 28  GLUCOSE 142* 103* 143* 112*  BUN 77* 67* 65* 75*  CREATININE 2.19* 1.77* 1.79* 1.85*  CALCIUM 9.4 9.5 9.2 9.5  MG  --  1.4*  --   --    GFR: Estimated Creatinine Clearance: 22.4 mL/min (by C-G formula based on Cr of 1.85). Liver Function Tests: No results for input(s): AST, ALT, ALKPHOS, BILITOT, PROT, ALBUMIN in  the last 168 hours. No results for input(s): LIPASE, AMYLASE in the last 168 hours. No results for input(s): AMMONIA in the last 168 hours. Coagulation Profile:  Recent Labs Lab 02/28/16 2218  INR 2.03*   Cardiac Enzymes:  Recent Labs Lab 02/28/16 2218 02/29/16 0422 02/29/16 0901  TROPONINI 0.04* 0.04* 0.03   BNP (last 3 results) No results for input(s): PROBNP in the last 8760 hours. HbA1C: No results for input(s): HGBA1C in the last 72 hours. CBG:  Recent Labs Lab 03/02/16 1157 03/02/16 1732 03/02/16 2101 03/03/16 0650 03/03/16 1154  GLUCAP 183* 159* 138* 105* 206*   Lipid Profile: No results for input(s): CHOL, HDL, LDLCALC, TRIG,  CHOLHDL, LDLDIRECT in the last 72 hours. Thyroid Function Tests: No results for input(s): TSH, T4TOTAL, FREET4, T3FREE, THYROIDAB in the last 72 hours. Anemia Panel: No results for input(s): VITAMINB12, FOLATE, FERRITIN, TIBC, IRON, RETICCTPCT in the last 72 hours. Urine analysis:    Component Value Date/Time   COLORURINE YELLOW 02/28/2016 1931   APPEARANCEUR CLOUDY* 02/28/2016 1931   LABSPEC 1.013 02/28/2016 1931   PHURINE 5.5 02/28/2016 1931   GLUCOSEU NEGATIVE 02/28/2016 1931   HGBUR TRACE* 02/28/2016 1931   BILIRUBINUR NEGATIVE 02/28/2016 1931   KETONESUR NEGATIVE 02/28/2016 1931   PROTEINUR NEGATIVE 02/28/2016 1931   UROBILINOGEN 1.0 07/22/2014 0300   NITRITE NEGATIVE 02/28/2016 1931   LEUKOCYTESUR MODERATE* 02/28/2016 1931   Sepsis Labs: @LABRCNTIP (procalcitonin:4,lacticidven:4)  ) Recent Results (from the past 240 hour(s))  Urine culture     Status: Abnormal   Collection Time: 02/28/16  7:31 PM  Result Value Ref Range Status   Specimen Description URINE, CATHETERIZED  Final   Special Requests Normal  Final   Culture >=100,000 COLONIES/mL ESCHERICHIA COLI (A)  Final   Report Status 03/02/2016 FINAL  Final   Organism ID, Bacteria ESCHERICHIA COLI (A)  Final      Susceptibility   Escherichia coli - MIC*    AMPICILLIN <=2 SENSITIVE Sensitive     CEFAZOLIN <=4 SENSITIVE Sensitive     CEFTRIAXONE <=1 SENSITIVE Sensitive     CIPROFLOXACIN <=0.25 SENSITIVE Sensitive     GENTAMICIN <=1 SENSITIVE Sensitive     IMIPENEM <=0.25 SENSITIVE Sensitive     NITROFURANTOIN <=16 SENSITIVE Sensitive     TRIMETH/SULFA <=20 SENSITIVE Sensitive     AMPICILLIN/SULBACTAM <=2 SENSITIVE Sensitive     PIP/TAZO <=4 SENSITIVE Sensitive     * >=100,000 COLONIES/mL ESCHERICHIA COLI  Culture, blood (x 2)     Status: None (Preliminary result)   Collection Time: 02/28/16 10:14 PM  Result Value Ref Range Status   Specimen Description BLOOD RIGHT ARM  Final   Special Requests BOTTLES DRAWN  AEROBIC AND ANAEROBIC 5ML  Final   Culture  Setup Time   Final    GRAM POSITIVE RODS AEROBIC BOTTLE ONLY Organism ID to follow CRITICAL RESULT CALLED TO, READ BACK BY AND VERIFIED WITH: CLamont Snowball D AT 0819 ON [id number] BY S. YARBROUGH    Culture TOO YOUNG TO READ  Final   Report Status PENDING  Incomplete  Blood Culture ID Panel (Reflexed)     Status: None   Collection Time: 02/28/16 10:14 PM  Result Value Ref Range Status   Enterococcus species NOT DETECTED NOT DETECTED Final   Vancomycin resistance NOT DETECTED NOT DETECTED Final   Listeria monocytogenes NOT DETECTED NOT DETECTED Final   Staphylococcus species NOT DETECTED NOT DETECTED Final   Staphylococcus aureus NOT DETECTED NOT DETECTED Final   Methicillin resistance  NOT DETECTED NOT DETECTED Final   Streptococcus species NOT DETECTED NOT DETECTED Final   Streptococcus agalactiae NOT DETECTED NOT DETECTED Final   Streptococcus pneumoniae NOT DETECTED NOT DETECTED Final   Streptococcus pyogenes NOT DETECTED NOT DETECTED Final   Acinetobacter baumannii NOT DETECTED NOT DETECTED Final   Enterobacteriaceae species NOT DETECTED NOT DETECTED Final   Enterobacter cloacae complex NOT DETECTED NOT DETECTED Final   Escherichia coli NOT DETECTED NOT DETECTED Final   Klebsiella oxytoca NOT DETECTED NOT DETECTED Final   Klebsiella pneumoniae NOT DETECTED NOT DETECTED Final   Proteus species NOT DETECTED NOT DETECTED Final   Serratia marcescens NOT DETECTED NOT DETECTED Final   Carbapenem resistance NOT DETECTED NOT DETECTED Final   Haemophilus influenzae NOT DETECTED NOT DETECTED Final   Neisseria meningitidis NOT DETECTED NOT DETECTED Final   Pseudomonas aeruginosa NOT DETECTED NOT DETECTED Final   Candida albicans NOT DETECTED NOT DETECTED Final   Candida glabrata NOT DETECTED NOT DETECTED Final   Candida krusei NOT DETECTED NOT DETECTED Final   Candida parapsilosis NOT DETECTED NOT DETECTED Final   Candida tropicalis NOT  DETECTED NOT DETECTED Final  Culture, blood (x 2)     Status: None (Preliminary result)   Collection Time: 02/28/16 10:20 PM  Result Value Ref Range Status   Specimen Description BLOOD RIGHT HAND  Final   Special Requests BOTTLES DRAWN AEROBIC AND ANAEROBIC 5ML  Final   Culture NO GROWTH 3 DAYS  Final   Report Status PENDING  Incomplete      Studies: No results found.  Scheduled Meds: . amLODipine  5 mg Oral Daily  . apixaban  2.5 mg Oral BID  . cefTRIAXone (ROCEPHIN)  IV  1 g Intravenous Q24H  . cloNIDine  0.3 mg Transdermal Q Fri  . colchicine  0.6 mg Oral Daily  . Ferrous Fumarate  1 tablet Oral q morning - 10a  . furosemide  20 mg Oral Daily  . insulin aspart  0-5 Units Subcutaneous QHS  . insulin aspart  0-9 Units Subcutaneous TID WC  . metoprolol tartrate  6.25 mg Oral Daily  . olopatadine  1 drop Both Eyes BID  . sodium chloride flush  3 mL Intravenous Q12H    Continuous Infusions:    LOS: 1 day   Time spent: [place], MD [facility] Pager [phone]  If 7PM-7AM, please contact night-coverage [email or web address] Password [email or web address] 03/03/2016, 2:20 PM

## 2016-03-03 NOTE — Progress Notes (Addendum)
Discharge orders placed, therefore, IV removed; cardiac monitoring discontinued, and CCMD notified.  However, family is appealing discharge to SNF, with possible discharge on Monday, 03/06/16.

## 2016-03-03 NOTE — Clinical Social Work Note (Signed)
CSW met with patient, family support, and RNCM. Patient will be discharging today but still does not want to go to SNF. Family support was researching [employer] on the computer and showing patient pictures. Patient stated that she was in her right mind and wanted to go home. Discussed patient's inability to walk and need for PT at facility. Will meet with patient, her son/POA, and RNCM when son arrives to discuss discharge plan for today.  Dayton Scrape, [place]

## 2016-03-03 NOTE — Care Management Obs Status (Signed)
[place] NOTIFICATION   Patient Details  Name: Courtney Bradford MRN: [id number] Date of Birth: Sep 09, 1931   Medicare Observation Status Notification Given:  Yes  Patient's son read the form but did not sign it.  Royston Bake, RN 03/03/2016, 10:52 AM

## 2016-03-04 LAB — CULTURE, BLOOD (ROUTINE X 2): Culture: NO GROWTH

## 2016-03-04 LAB — GLUCOSE, CAPILLARY
GLUCOSE-CAPILLARY: 107 mg/dL — AB (ref 65–99)
GLUCOSE-CAPILLARY: 184 mg/dL — AB (ref 65–99)
GLUCOSE-CAPILLARY: 150 mg/dL — AB (ref 65–99)
Glucose-Capillary: 110 mg/dL — ABNORMAL HIGH (ref 65–99)

## 2016-03-04 NOTE — Progress Notes (Signed)
Pt ambulated in the hall with walker accompanied by his son.

## 2016-03-04 NOTE — Discharge Summary (Signed)
Discharge Summary  Courtney Bradford [id number] DOB: 05-08-1931  PCP: Maggie Font, MD  Admit date: 02/28/2016 Discharge date: 03/04/2016   Recommendations for Outpatient Follow-up:  1. PCP 2 weeks   Discharge Diagnoses:  Active Hospital Problems   Diagnosis Date Noted  . Acute on chronic diastolic heart failure ([place]) 07/22/2014  . Acute encephalopathy 02/28/2016  . Hypokalemia 11/11/2015  . Acute renal failure superimposed on stage 3 chronic kidney disease ([place]) 11/11/2015  . A-fib ([place]) 11/11/2015  . Gout   . UTI (urinary tract infection) 07/23/2014  . Diabetes mellitus type 2, controlled ([place]) 07/22/2014  . Atrial fibrillation, chronic ([place]) 07/22/2014  . Essential hypertension 11/03/2009    Resolved Hospital Problems   Diagnosis Date Noted Date Resolved  No resolved problems to display.    Discharge Condition: Stable   Diet recommendation: Heart Healthy   Filed Vitals:   03/03/16 2031 03/04/16 0502  BP: 121/49 119/57  Pulse: 59 59  Temp: 98.1 F (36.7 C) 98.6 F (37 C)  Resp: 16 18    History of present illness:  [id number] with hx diastolic heart failure admitted with AMS, weakness and bilateral leg edema. She is improving on IV rocephin and with IV lasix diuresis. Ready for dc today, has bed at SNF family is appealing discharge.   Hospital Course:  Acute encephalopathy - Most likely secondary to infectious etiology - CT of head negative - improved  Acute on chronic diastolic heart failure (HCC) - improved, will place on PO lasix today - Continue monitoring intake and output as well as daily weights.  Active Problems:  Essential hypertension  Atrial fibrillation, chronic (HCC) - On eliquis -Continue metoprolol for rate control   Diabetes mellitus type 2, controlled ([place]) - Patient on sliding scale insulin   UTI (urinary tract infection) - Completed course of rocephin in the hospital    Acute renal failure superimposed on stage 3 chronic  kidney disease (HCC) - Serum creatinine trending down on last check, now bumped a little so will reduce lasix further. Next check on Monday 5/8   Gout  Hypokalemia - Resolved   Consultants:  None  Procedures:  Echo 5/2  Antimicrobials:  Rocephin started 5/1.  Discharge Exam: BP 119/57 mmHg  Pulse 59  Temp(Src) 98.6 F (37 C) (Oral)  Resp 18  Ht 5\' 4"  (1.626 m)  Wt 67.495 kg (148 lb 12.8 oz)  BMI 25.53 kg/m2  SpO2 100% General:  Alert, oriented, calm, in no acute distress  Eyes: pupils round and reactive to light and accomodation, clear sclerea Neck: supple, no masses, trachea mildline  Cardiovascular: RRR, no murmurs or rubs, no peripheral edema  Respiratory: clear to auscultation bilaterally, no wheezes, no crackles  Abdomen: soft, nontender, nondistended, normal bowel tones heard  Skin: dry, no rashes  Musculoskeletal: no joint effusions, normal range of motion  Psychiatric: appropriate affect, normal speech  Neurologic: extraocular muscles intact, clear speech, moving all extremities with intact sensorium    Discharge Instructions You were cared for by a hospitalist during your hospital stay. If you have any questions about your discharge medications or the care you received while you were in the hospital after you are discharged, you can call the unit and asked to speak with the hospitalist on call if the hospitalist that took care of you is not available. Once you are discharged, your primary care physician will handle any further medical issues. Please note that NO REFILLS for any discharge medications will be authorized once  you are discharged, as it is imperative that you return to your primary care physician (or establish a relationship with a primary care physician if you do not have one) for your aftercare needs so that they can reassess your need for medications and monitor your lab values.     Medication List    STOP taking these medications         Amlodipine-Valsartan-HCTZ 10-320-25 MG Tabs     metoprolol tartrate 12.5 mg Tabs tablet  Commonly known as:  LOPRESSOR  Replaced by:  metoprolol tartrate 25 mg/10 mL Susp      TAKE these medications        amLODipine 5 MG tablet  Commonly known as:  NORVASC  Take 5 mg by mouth daily.     apixaban 5 MG Tabs tablet  Commonly known as:  ELIQUIS  Take 1 tablet (5 mg total) by mouth 2 (two) times daily.     cefUROXime 250 MG tablet  Commonly known as:  CEFTIN  Take 1 tablet (250 mg total) by mouth 2 (two) times daily with a meal.     cloNIDine 0.3 mg/24hr patch  Commonly known as:  CATAPRES - Dosed in mg/24 hr  Place 0.3 mg onto the skin every Friday.     colchicine 0.6 MG tablet  Take 1 tablet (0.6 mg total) by mouth daily.     ferrous fumarate 325 (106 Fe) MG Tabs tablet  Commonly known as:  HEMOCYTE - 106 mg FE  Take 1 tablet by mouth every morning.     furosemide 20 MG tablet  Commonly known as:  LASIX  Take 20 mg by mouth daily.     HUMULIN R 100 units/mL injection  Generic drug:  insulin regular  Inject 10 Units into the skin every morning.     metoprolol tartrate 25 mg/10 mL Susp  Commonly known as:  LOPRESSOR  Take 2.5 mLs (6.25 mg total) by mouth daily.     Olopatadine HCl 0.2 % Soln  Place 1 drop into both eyes daily as needed (watery eyes).     predniSONE 10 MG tablet  Commonly known as:  DELTASONE  Take  40mg  (4 tabs) x 2 days, then taper to 30mg  (3 tabs) x 2 days, then 20mg  (2 tabs) x  2days, then 10mg  (1 tab) x 2 days, then OFF.       Allergies  Allergen Reactions  . Lactose Intolerance (Gi) Other (See Comments)    Upset stomach      The results of significant diagnostics from this hospitalization (including imaging, microbiology, ancillary and laboratory) are listed below for reference.    Significant Diagnostic Studies: Ct Head Wo Contrast  02/29/2016  CLINICAL DATA:  Acute encephalopathy.  Altered mental status. EXAM: CT HEAD WITHOUT  CONTRAST TECHNIQUE: Contiguous axial images were obtained from the base of the skull through the vertex without intravenous contrast. COMPARISON:  None. FINDINGS: Diffuse cerebral atrophy. Mild ventricular dilatation consistent with central atrophy. Low-attenuation changes diffusely throughout the deep white matter are nonspecific but likely represent small vessel ischemia. No mass effect or midline shift. No abnormal extra-axial fluid collections. Gray-white matter junctions are distinct. Basal cisterns are not effaced. No evidence of acute intracranial hemorrhage. No depressed skull fractures. Visualized paranasal sinuses and mastoid air cells are not opacified. Vascular calcifications. IMPRESSION: No acute intracranial abnormalities. Chronic atrophy and small vessel ischemic changes. Electronically Signed   By: Lucienne Capers M.D.   On: 02/29/2016 00:57  Microbiology: Recent Results (from the past 240 hour(s))  Urine culture     Status: Abnormal   Collection Time: 02/28/16  7:31 PM  Result Value Ref Range Status   Specimen Description URINE, CATHETERIZED  Final   Special Requests Normal  Final   Culture >=100,000 COLONIES/mL ESCHERICHIA COLI (A)  Final   Report Status 03/02/2016 FINAL  Final   Organism ID, Bacteria ESCHERICHIA COLI (A)  Final      Susceptibility   Escherichia coli - MIC*    AMPICILLIN <=2 SENSITIVE Sensitive     CEFAZOLIN <=4 SENSITIVE Sensitive     CEFTRIAXONE <=1 SENSITIVE Sensitive     CIPROFLOXACIN <=0.25 SENSITIVE Sensitive     GENTAMICIN <=1 SENSITIVE Sensitive     IMIPENEM <=0.25 SENSITIVE Sensitive     NITROFURANTOIN <=16 SENSITIVE Sensitive     TRIMETH/SULFA <=20 SENSITIVE Sensitive     AMPICILLIN/SULBACTAM <=2 SENSITIVE Sensitive     PIP/TAZO <=4 SENSITIVE Sensitive     * >=100,000 COLONIES/mL ESCHERICHIA COLI  Culture, blood (x 2)     Status: Abnormal   Collection Time: 02/28/16 10:14 PM  Result Value Ref Range Status   Specimen Description BLOOD RIGHT  ARM  Final   Special Requests BOTTLES DRAWN AEROBIC AND ANAEROBIC 5ML  Final   Culture  Setup Time   Final    GRAM POSITIVE RODS AEROBIC BOTTLE ONLY Organism ID to follow CRITICAL RESULT CALLED TO, READ BACK BY AND VERIFIED WITH: C. STEWART, PHARM D AT 0819 ON [id number] BY Rhea Bleacher    Culture (A)  Final    DIPHTHEROIDS(CORYNEBACTERIUM SPECIES) Standardized susceptibility testing for this organism is not available.    Report Status 03/04/2016 FINAL  Final  Blood Culture ID Panel (Reflexed)     Status: None   Collection Time: 02/28/16 10:14 PM  Result Value Ref Range Status   Enterococcus species NOT DETECTED NOT DETECTED Final   Vancomycin resistance NOT DETECTED NOT DETECTED Final   Listeria monocytogenes NOT DETECTED NOT DETECTED Final   Staphylococcus species NOT DETECTED NOT DETECTED Final   Staphylococcus aureus NOT DETECTED NOT DETECTED Final   Methicillin resistance NOT DETECTED NOT DETECTED Final   Streptococcus species NOT DETECTED NOT DETECTED Final   Streptococcus agalactiae NOT DETECTED NOT DETECTED Final   Streptococcus pneumoniae NOT DETECTED NOT DETECTED Final   Streptococcus pyogenes NOT DETECTED NOT DETECTED Final   Acinetobacter baumannii NOT DETECTED NOT DETECTED Final   Enterobacteriaceae species NOT DETECTED NOT DETECTED Final   Enterobacter cloacae complex NOT DETECTED NOT DETECTED Final   Escherichia coli NOT DETECTED NOT DETECTED Final   Klebsiella oxytoca NOT DETECTED NOT DETECTED Final   Klebsiella pneumoniae NOT DETECTED NOT DETECTED Final   Proteus species NOT DETECTED NOT DETECTED Final   Serratia marcescens NOT DETECTED NOT DETECTED Final   Carbapenem resistance NOT DETECTED NOT DETECTED Final   Haemophilus influenzae NOT DETECTED NOT DETECTED Final   Neisseria meningitidis NOT DETECTED NOT DETECTED Final   Pseudomonas aeruginosa NOT DETECTED NOT DETECTED Final   Candida albicans NOT DETECTED NOT DETECTED Final   Candida glabrata NOT DETECTED NOT  DETECTED Final   Candida krusei NOT DETECTED NOT DETECTED Final   Candida parapsilosis NOT DETECTED NOT DETECTED Final   Candida tropicalis NOT DETECTED NOT DETECTED Final  Culture, blood (x 2)     Status: None (Preliminary result)   Collection Time: 02/28/16 10:20 PM  Result Value Ref Range Status   Specimen Description BLOOD RIGHT HAND  Final  Special Requests BOTTLES DRAWN AEROBIC AND ANAEROBIC 5ML  Final   Culture NO GROWTH 4 DAYS  Final   Report Status PENDING  Incomplete     Labs: Basic Metabolic Panel:  Recent Labs Lab 02/28/16 1713 02/29/16 0422 03/01/16 0229 03/03/16 0537  NA 140 143 139 142  K 2.9* 3.6 3.9 3.6  CL 103 108 102 102  CO2 21* 21* 23 28  GLUCOSE 142* 103* 143* 112*  BUN 77* 67* 65* 75*  CREATININE 2.19* 1.77* 1.79* 1.85*  CALCIUM 9.4 9.5 9.2 9.5  MG  --  1.4*  --   --    Liver Function Tests: No results for input(s): AST, ALT, ALKPHOS, BILITOT, PROT, ALBUMIN in the last 168 hours. No results for input(s): LIPASE, AMYLASE in the last 168 hours. No results for input(s): AMMONIA in the last 168 hours. CBC:  Recent Labs Lab 02/28/16 1713 03/01/16 0232 03/03/16 0537  WBC 8.7 9.1 5.3  HGB 8.4* 7.5* 7.6*  HCT 26.3* 22.7* 24.3*  MCV 81.2 83.5 82.9  PLT 464* 416* 541*   Cardiac Enzymes:  Recent Labs Lab 02/28/16 2218 02/29/16 0422 02/29/16 0901  TROPONINI 0.04* 0.04* 0.03   BNP: BNP (last 3 results)  Recent Labs  02/29/16 0422  BNP 789.2*    ProBNP (last 3 results) No results for input(s): PROBNP in the last 8760 hours.  CBG:  Recent Labs Lab 03/03/16 1154 03/03/16 1652 03/03/16 2037 03/04/16 0618 03/04/16 1151  GLUCAP 206* 108* 164* 107* 184*    Time spent: 38 minutes were spent in preparing this discharge including medication reconciliation, counseling, and coordination of care.  Signed:  Kiyah Demartini [employer]  [facility] 03/04/2016, 1:02 PM

## 2016-03-04 NOTE — Progress Notes (Signed)
Pt ambulated in hallway with NT using walker. Pt denies any pain at this time. Returned to chair.

## 2016-03-05 LAB — GLUCOSE, CAPILLARY
Glucose-Capillary: 129 mg/dL — ABNORMAL HIGH (ref 65–99)
Glucose-Capillary: 110 mg/dL — ABNORMAL HIGH (ref 65–99)

## 2016-03-05 MED ORDER — COLCHICINE 0.6 MG PO TABS: 0.6000 mg | ORAL_TABLET | Freq: Every day | ORAL | Status: DC

## 2016-03-05 NOTE — Care Management Note (Signed)
Case Management Note  Patient Details  Name: Courtney Bradford MRN: [id number] Date of Birth: 03-Feb-1931  Subjective/Objective:                  Generalized weakness, bilateral leg edema and confusion Action/Plan: Discharge planning Expected Discharge Date:  03/05/16               Expected Discharge Plan:  [place]  In-House Referral:     Discharge planning Services  CM Consult  Post Acute Care Choice:  Home Health Choice offered to:  Adult Children, Patient  DME Arranged:  N/A DME Agency:  NA  HH Arranged:  RN, PT, OT, Nurse's Aide, Social Work [employer] Agency:  [facility]  Status of Service:  Completed, signed off  Medicare Important Message Given:  Yes Date Medicare IM Given:    Medicare IM give by:    Date Additional Medicare IM Given:    Additional Medicare Important Message give by:     If discussed at [place] of Stay Meetings, dates discussed:    Additional Comments: CM met with pt and son, Courtney Bradford.  CM asked pt permission to discuss disposition with Courtney Bradford in room and pt states, "yes."  CM spoke with family extensively offering choice of home health agency. Family choose [facility] to render HHPT/OT/RN/aide/SW.  Social worker is to advocate for pt and Courtney Bradford to find agency accepting pt's CNA 39hour/month.  Cm asked if [name] has a Therapist, music and Courtney Bradford states he has been going down the list to enlist CNA care for 39 hours/mos. CM gave Courtney Bradford [facility] information as a possibility for daily CNA care.  Referral called to [facility] rep, Tiffany.  Courtney Bradford states pt has all DME needed at home from previous hospitalizations.  No other CM needs were communicated. Dellie Catholic, RN 03/05/2016, 1:52 PM

## 2016-03-05 NOTE — Progress Notes (Signed)
Pt refused to walk before lunch. Told he we would try later.

## 2016-03-05 NOTE — Progress Notes (Signed)
Pt ambulated up to the nursing station with family at discharge taken out from nursing station via wheelchair. Discharged instructions given to Ron( son) questions answered. IV and tele removed Friday.

## 2016-03-05 NOTE — Progress Notes (Signed)
Pt seen and examined this AM, with niece at bedside. Feels well, no complaints.  Tells me her son is coming to take her home today.

## 2016-03-06 DIAGNOSIS — Z7901 Long term (current) use of anticoagulants: Secondary | ICD-10-CM | POA: Diagnosis not present

## 2016-03-06 DIAGNOSIS — Z87440 Personal history of urinary (tract) infections: Secondary | ICD-10-CM | POA: Diagnosis not present

## 2016-03-06 DIAGNOSIS — Z794 Long term (current) use of insulin: Secondary | ICD-10-CM | POA: Diagnosis not present

## 2016-03-06 DIAGNOSIS — I5033 Acute on chronic diastolic (congestive) heart failure: Secondary | ICD-10-CM | POA: Diagnosis not present

## 2016-03-06 DIAGNOSIS — E1122 Type 2 diabetes mellitus with diabetic chronic kidney disease: Secondary | ICD-10-CM | POA: Diagnosis not present

## 2016-03-06 DIAGNOSIS — I482 Chronic atrial fibrillation: Secondary | ICD-10-CM | POA: Diagnosis not present

## 2016-03-06 DIAGNOSIS — I130 Hypertensive heart and chronic kidney disease with heart failure and stage 1 through stage 4 chronic kidney disease, or unspecified chronic kidney disease: Secondary | ICD-10-CM | POA: Diagnosis not present

## 2016-03-06 DIAGNOSIS — N183 Chronic kidney disease, stage 3 (moderate): Secondary | ICD-10-CM | POA: Diagnosis not present

## 2016-03-07 DIAGNOSIS — Z794 Long term (current) use of insulin: Secondary | ICD-10-CM | POA: Diagnosis not present

## 2016-03-07 DIAGNOSIS — I5033 Acute on chronic diastolic (congestive) heart failure: Secondary | ICD-10-CM | POA: Diagnosis not present

## 2016-03-07 DIAGNOSIS — Z87440 Personal history of urinary (tract) infections: Secondary | ICD-10-CM | POA: Diagnosis not present

## 2016-03-07 DIAGNOSIS — Z7901 Long term (current) use of anticoagulants: Secondary | ICD-10-CM | POA: Diagnosis not present

## 2016-03-07 DIAGNOSIS — I130 Hypertensive heart and chronic kidney disease with heart failure and stage 1 through stage 4 chronic kidney disease, or unspecified chronic kidney disease: Secondary | ICD-10-CM | POA: Diagnosis not present

## 2016-03-07 DIAGNOSIS — E1122 Type 2 diabetes mellitus with diabetic chronic kidney disease: Secondary | ICD-10-CM | POA: Diagnosis not present

## 2016-03-07 DIAGNOSIS — I482 Chronic atrial fibrillation: Secondary | ICD-10-CM | POA: Diagnosis not present

## 2016-03-07 DIAGNOSIS — N183 Chronic kidney disease, stage 3 (moderate): Secondary | ICD-10-CM | POA: Diagnosis not present

## 2016-03-09 DIAGNOSIS — I5033 Acute on chronic diastolic (congestive) heart failure: Secondary | ICD-10-CM | POA: Diagnosis not present

## 2016-03-09 DIAGNOSIS — I130 Hypertensive heart and chronic kidney disease with heart failure and stage 1 through stage 4 chronic kidney disease, or unspecified chronic kidney disease: Secondary | ICD-10-CM | POA: Diagnosis not present

## 2016-03-09 DIAGNOSIS — N183 Chronic kidney disease, stage 3 (moderate): Secondary | ICD-10-CM | POA: Diagnosis not present

## 2016-03-09 DIAGNOSIS — E1122 Type 2 diabetes mellitus with diabetic chronic kidney disease: Secondary | ICD-10-CM | POA: Diagnosis not present

## 2016-03-09 DIAGNOSIS — Z794 Long term (current) use of insulin: Secondary | ICD-10-CM | POA: Diagnosis not present

## 2016-03-09 DIAGNOSIS — I482 Chronic atrial fibrillation: Secondary | ICD-10-CM | POA: Diagnosis not present

## 2016-03-09 DIAGNOSIS — Z87440 Personal history of urinary (tract) infections: Secondary | ICD-10-CM | POA: Diagnosis not present

## 2016-03-09 DIAGNOSIS — Z7901 Long term (current) use of anticoagulants: Secondary | ICD-10-CM | POA: Diagnosis not present

## 2016-03-10 DIAGNOSIS — Z87440 Personal history of urinary (tract) infections: Secondary | ICD-10-CM | POA: Diagnosis not present

## 2016-03-10 DIAGNOSIS — Z794 Long term (current) use of insulin: Secondary | ICD-10-CM | POA: Diagnosis not present

## 2016-03-10 DIAGNOSIS — Z7901 Long term (current) use of anticoagulants: Secondary | ICD-10-CM | POA: Diagnosis not present

## 2016-03-10 DIAGNOSIS — I130 Hypertensive heart and chronic kidney disease with heart failure and stage 1 through stage 4 chronic kidney disease, or unspecified chronic kidney disease: Secondary | ICD-10-CM | POA: Diagnosis not present

## 2016-03-10 DIAGNOSIS — E1122 Type 2 diabetes mellitus with diabetic chronic kidney disease: Secondary | ICD-10-CM | POA: Diagnosis not present

## 2016-03-10 DIAGNOSIS — I5033 Acute on chronic diastolic (congestive) heart failure: Secondary | ICD-10-CM | POA: Diagnosis not present

## 2016-03-10 DIAGNOSIS — N183 Chronic kidney disease, stage 3 (moderate): Secondary | ICD-10-CM | POA: Diagnosis not present

## 2016-03-10 DIAGNOSIS — I482 Chronic atrial fibrillation: Secondary | ICD-10-CM | POA: Diagnosis not present

## 2016-03-13 DIAGNOSIS — Z794 Long term (current) use of insulin: Secondary | ICD-10-CM | POA: Diagnosis not present

## 2016-03-13 DIAGNOSIS — Z87440 Personal history of urinary (tract) infections: Secondary | ICD-10-CM | POA: Diagnosis not present

## 2016-03-13 DIAGNOSIS — N183 Chronic kidney disease, stage 3 (moderate): Secondary | ICD-10-CM | POA: Diagnosis not present

## 2016-03-13 DIAGNOSIS — Z7901 Long term (current) use of anticoagulants: Secondary | ICD-10-CM | POA: Diagnosis not present

## 2016-03-13 DIAGNOSIS — I482 Chronic atrial fibrillation: Secondary | ICD-10-CM | POA: Diagnosis not present

## 2016-03-13 DIAGNOSIS — I130 Hypertensive heart and chronic kidney disease with heart failure and stage 1 through stage 4 chronic kidney disease, or unspecified chronic kidney disease: Secondary | ICD-10-CM | POA: Diagnosis not present

## 2016-03-13 DIAGNOSIS — I5033 Acute on chronic diastolic (congestive) heart failure: Secondary | ICD-10-CM | POA: Diagnosis not present

## 2016-03-13 DIAGNOSIS — E1122 Type 2 diabetes mellitus with diabetic chronic kidney disease: Secondary | ICD-10-CM | POA: Diagnosis not present

## 2016-03-14 DIAGNOSIS — Z7901 Long term (current) use of anticoagulants: Secondary | ICD-10-CM | POA: Diagnosis not present

## 2016-03-14 DIAGNOSIS — I482 Chronic atrial fibrillation: Secondary | ICD-10-CM | POA: Diagnosis not present

## 2016-03-14 DIAGNOSIS — I130 Hypertensive heart and chronic kidney disease with heart failure and stage 1 through stage 4 chronic kidney disease, or unspecified chronic kidney disease: Secondary | ICD-10-CM | POA: Diagnosis not present

## 2016-03-14 DIAGNOSIS — N183 Chronic kidney disease, stage 3 (moderate): Secondary | ICD-10-CM | POA: Diagnosis not present

## 2016-03-14 DIAGNOSIS — Z87440 Personal history of urinary (tract) infections: Secondary | ICD-10-CM | POA: Diagnosis not present

## 2016-03-14 DIAGNOSIS — E1122 Type 2 diabetes mellitus with diabetic chronic kidney disease: Secondary | ICD-10-CM | POA: Diagnosis not present

## 2016-03-14 DIAGNOSIS — Z794 Long term (current) use of insulin: Secondary | ICD-10-CM | POA: Diagnosis not present

## 2016-03-14 DIAGNOSIS — I5033 Acute on chronic diastolic (congestive) heart failure: Secondary | ICD-10-CM | POA: Diagnosis not present

## 2016-03-16 DIAGNOSIS — N183 Chronic kidney disease, stage 3 (moderate): Secondary | ICD-10-CM | POA: Diagnosis not present

## 2016-03-16 DIAGNOSIS — I5033 Acute on chronic diastolic (congestive) heart failure: Secondary | ICD-10-CM | POA: Diagnosis not present

## 2016-03-16 DIAGNOSIS — Z794 Long term (current) use of insulin: Secondary | ICD-10-CM | POA: Diagnosis not present

## 2016-03-16 DIAGNOSIS — Z7901 Long term (current) use of anticoagulants: Secondary | ICD-10-CM | POA: Diagnosis not present

## 2016-03-16 DIAGNOSIS — I482 Chronic atrial fibrillation: Secondary | ICD-10-CM | POA: Diagnosis not present

## 2016-03-16 DIAGNOSIS — E1122 Type 2 diabetes mellitus with diabetic chronic kidney disease: Secondary | ICD-10-CM | POA: Diagnosis not present

## 2016-03-16 DIAGNOSIS — Z87440 Personal history of urinary (tract) infections: Secondary | ICD-10-CM | POA: Diagnosis not present

## 2016-03-16 DIAGNOSIS — I130 Hypertensive heart and chronic kidney disease with heart failure and stage 1 through stage 4 chronic kidney disease, or unspecified chronic kidney disease: Secondary | ICD-10-CM | POA: Diagnosis not present

## 2016-03-17 DIAGNOSIS — N183 Chronic kidney disease, stage 3 (moderate): Secondary | ICD-10-CM | POA: Diagnosis not present

## 2016-03-17 DIAGNOSIS — Z794 Long term (current) use of insulin: Secondary | ICD-10-CM | POA: Diagnosis not present

## 2016-03-17 DIAGNOSIS — I130 Hypertensive heart and chronic kidney disease with heart failure and stage 1 through stage 4 chronic kidney disease, or unspecified chronic kidney disease: Secondary | ICD-10-CM | POA: Diagnosis not present

## 2016-03-17 DIAGNOSIS — Z87440 Personal history of urinary (tract) infections: Secondary | ICD-10-CM | POA: Diagnosis not present

## 2016-03-17 DIAGNOSIS — Z7901 Long term (current) use of anticoagulants: Secondary | ICD-10-CM | POA: Diagnosis not present

## 2016-03-17 DIAGNOSIS — E1122 Type 2 diabetes mellitus with diabetic chronic kidney disease: Secondary | ICD-10-CM | POA: Diagnosis not present

## 2016-03-17 DIAGNOSIS — I5033 Acute on chronic diastolic (congestive) heart failure: Secondary | ICD-10-CM | POA: Diagnosis not present

## 2016-03-17 DIAGNOSIS — I482 Chronic atrial fibrillation: Secondary | ICD-10-CM | POA: Diagnosis not present

## 2016-03-21 DIAGNOSIS — Z7901 Long term (current) use of anticoagulants: Secondary | ICD-10-CM | POA: Diagnosis not present

## 2016-03-21 DIAGNOSIS — I130 Hypertensive heart and chronic kidney disease with heart failure and stage 1 through stage 4 chronic kidney disease, or unspecified chronic kidney disease: Secondary | ICD-10-CM | POA: Diagnosis not present

## 2016-03-21 DIAGNOSIS — E1122 Type 2 diabetes mellitus with diabetic chronic kidney disease: Secondary | ICD-10-CM | POA: Diagnosis not present

## 2016-03-21 DIAGNOSIS — N183 Chronic kidney disease, stage 3 (moderate): Secondary | ICD-10-CM | POA: Diagnosis not present

## 2016-03-21 DIAGNOSIS — Z87440 Personal history of urinary (tract) infections: Secondary | ICD-10-CM | POA: Diagnosis not present

## 2016-03-21 DIAGNOSIS — Z794 Long term (current) use of insulin: Secondary | ICD-10-CM | POA: Diagnosis not present

## 2016-03-21 DIAGNOSIS — I482 Chronic atrial fibrillation: Secondary | ICD-10-CM | POA: Diagnosis not present

## 2016-03-21 DIAGNOSIS — I5033 Acute on chronic diastolic (congestive) heart failure: Secondary | ICD-10-CM | POA: Diagnosis not present

## 2016-03-24 DIAGNOSIS — E1122 Type 2 diabetes mellitus with diabetic chronic kidney disease: Secondary | ICD-10-CM | POA: Diagnosis not present

## 2016-03-24 DIAGNOSIS — Z87440 Personal history of urinary (tract) infections: Secondary | ICD-10-CM | POA: Diagnosis not present

## 2016-03-24 DIAGNOSIS — I130 Hypertensive heart and chronic kidney disease with heart failure and stage 1 through stage 4 chronic kidney disease, or unspecified chronic kidney disease: Secondary | ICD-10-CM | POA: Diagnosis not present

## 2016-03-24 DIAGNOSIS — Z7901 Long term (current) use of anticoagulants: Secondary | ICD-10-CM | POA: Diagnosis not present

## 2016-03-24 DIAGNOSIS — I5033 Acute on chronic diastolic (congestive) heart failure: Secondary | ICD-10-CM | POA: Diagnosis not present

## 2016-03-24 DIAGNOSIS — N183 Chronic kidney disease, stage 3 (moderate): Secondary | ICD-10-CM | POA: Diagnosis not present

## 2016-03-24 DIAGNOSIS — Z794 Long term (current) use of insulin: Secondary | ICD-10-CM | POA: Diagnosis not present

## 2016-03-24 DIAGNOSIS — I482 Chronic atrial fibrillation: Secondary | ICD-10-CM | POA: Diagnosis not present

## 2016-04-08 DIAGNOSIS — E119 Type 2 diabetes mellitus without complications: Secondary | ICD-10-CM | POA: Diagnosis not present

## 2016-04-08 DIAGNOSIS — E1122 Type 2 diabetes mellitus with diabetic chronic kidney disease: Secondary | ICD-10-CM | POA: Diagnosis not present

## 2016-04-08 DIAGNOSIS — M109 Gout, unspecified: Secondary | ICD-10-CM | POA: Diagnosis not present

## 2016-04-08 DIAGNOSIS — I10 Essential (primary) hypertension: Secondary | ICD-10-CM | POA: Diagnosis not present

## 2016-07-17 DIAGNOSIS — I110 Hypertensive heart disease with heart failure: Secondary | ICD-10-CM | POA: Diagnosis not present

## 2016-07-17 DIAGNOSIS — D631 Anemia in chronic kidney disease: Secondary | ICD-10-CM | POA: Diagnosis not present

## 2016-07-17 DIAGNOSIS — N183 Chronic kidney disease, stage 3 (moderate): Secondary | ICD-10-CM | POA: Diagnosis not present

## 2016-07-17 DIAGNOSIS — E163 Increased secretion of glucagon: Secondary | ICD-10-CM | POA: Diagnosis not present

## 2016-07-17 DIAGNOSIS — E1122 Type 2 diabetes mellitus with diabetic chronic kidney disease: Secondary | ICD-10-CM | POA: Diagnosis not present

## 2016-07-17 DIAGNOSIS — I10 Essential (primary) hypertension: Secondary | ICD-10-CM | POA: Diagnosis not present

## 2016-07-17 DIAGNOSIS — E162 Hypoglycemia, unspecified: Secondary | ICD-10-CM | POA: Diagnosis not present

## 2016-10-09 DIAGNOSIS — D649 Anemia, unspecified: Secondary | ICD-10-CM | POA: Diagnosis not present

## 2016-10-09 DIAGNOSIS — I10 Essential (primary) hypertension: Secondary | ICD-10-CM | POA: Diagnosis not present

## 2016-10-09 DIAGNOSIS — N183 Chronic kidney disease, stage 3 (moderate): Secondary | ICD-10-CM | POA: Diagnosis not present

## 2016-10-09 DIAGNOSIS — E1122 Type 2 diabetes mellitus with diabetic chronic kidney disease: Secondary | ICD-10-CM | POA: Diagnosis not present

## 2016-10-09 DIAGNOSIS — D638 Anemia in other chronic diseases classified elsewhere: Secondary | ICD-10-CM | POA: Diagnosis not present

## 2016-10-09 DIAGNOSIS — Z23 Encounter for immunization: Secondary | ICD-10-CM | POA: Diagnosis not present

## 2017-05-21 DIAGNOSIS — I10 Essential (primary) hypertension: Secondary | ICD-10-CM | POA: Diagnosis not present

## 2017-05-21 DIAGNOSIS — N184 Chronic kidney disease, stage 4 (severe): Secondary | ICD-10-CM | POA: Diagnosis not present

## 2017-05-21 DIAGNOSIS — E1122 Type 2 diabetes mellitus with diabetic chronic kidney disease: Secondary | ICD-10-CM | POA: Diagnosis not present

## 2017-08-20 DIAGNOSIS — E1122 Type 2 diabetes mellitus with diabetic chronic kidney disease: Secondary | ICD-10-CM | POA: Diagnosis not present

## 2017-08-20 DIAGNOSIS — I110 Hypertensive heart disease with heart failure: Secondary | ICD-10-CM | POA: Diagnosis not present

## 2017-08-20 DIAGNOSIS — N184 Chronic kidney disease, stage 4 (severe): Secondary | ICD-10-CM | POA: Diagnosis not present

## 2017-08-20 DIAGNOSIS — Z23 Encounter for immunization: Secondary | ICD-10-CM | POA: Diagnosis not present

## 2017-08-20 DIAGNOSIS — Z7901 Long term (current) use of anticoagulants: Secondary | ICD-10-CM | POA: Diagnosis not present

## 2017-11-19 DIAGNOSIS — I10 Essential (primary) hypertension: Secondary | ICD-10-CM | POA: Diagnosis not present

## 2017-11-19 DIAGNOSIS — E1122 Type 2 diabetes mellitus with diabetic chronic kidney disease: Secondary | ICD-10-CM | POA: Diagnosis not present

## 2017-11-19 DIAGNOSIS — N184 Chronic kidney disease, stage 4 (severe): Secondary | ICD-10-CM | POA: Diagnosis not present

## 2018-04-02 DIAGNOSIS — E1122 Type 2 diabetes mellitus with diabetic chronic kidney disease: Secondary | ICD-10-CM | POA: Diagnosis not present

## 2018-04-02 DIAGNOSIS — Z7901 Long term (current) use of anticoagulants: Secondary | ICD-10-CM | POA: Diagnosis not present

## 2018-04-02 DIAGNOSIS — I10 Essential (primary) hypertension: Secondary | ICD-10-CM | POA: Diagnosis not present

## 2018-04-02 DIAGNOSIS — N184 Chronic kidney disease, stage 4 (severe): Secondary | ICD-10-CM | POA: Diagnosis not present

## 2019-01-18 ENCOUNTER — Emergency Department (HOSPITAL_COMMUNITY)
Admission: EM | Admit: 2019-01-18 | Discharge: 2019-01-18 | Disposition: A | Discharge status: Home / Self Care | Payer: Medicare Other | Attending: Emergency Medicine | Admitting: Emergency Medicine

## 2019-01-18 ENCOUNTER — Other Ambulatory Visit: Payer: Self-pay

## 2019-01-18 ENCOUNTER — Encounter (HOSPITAL_COMMUNITY): Payer: Self-pay | Admitting: Emergency Medicine

## 2019-01-18 ENCOUNTER — Emergency Department (HOSPITAL_COMMUNITY): Payer: Medicare Other

## 2019-01-18 DIAGNOSIS — R600 Localized edema: Secondary | ICD-10-CM | POA: Diagnosis present

## 2019-01-18 DIAGNOSIS — M10041 Idiopathic gout, right hand: Secondary | ICD-10-CM | POA: Insufficient documentation

## 2019-01-18 DIAGNOSIS — I130 Hypertensive heart and chronic kidney disease with heart failure and stage 1 through stage 4 chronic kidney disease, or unspecified chronic kidney disease: Secondary | ICD-10-CM | POA: Insufficient documentation

## 2019-01-18 DIAGNOSIS — Z79899 Other long term (current) drug therapy: Secondary | ICD-10-CM | POA: Insufficient documentation

## 2019-01-18 DIAGNOSIS — I509 Heart failure, unspecified: Secondary | ICD-10-CM | POA: Insufficient documentation

## 2019-01-18 DIAGNOSIS — M89541 Osteolysis, right hand: Secondary | ICD-10-CM | POA: Insufficient documentation

## 2019-01-18 DIAGNOSIS — N183 Chronic kidney disease, stage 3 (moderate): Secondary | ICD-10-CM | POA: Insufficient documentation

## 2019-01-18 DIAGNOSIS — M10042 Idiopathic gout, left hand: Secondary | ICD-10-CM

## 2019-01-18 DIAGNOSIS — Z794 Long term (current) use of insulin: Secondary | ICD-10-CM | POA: Diagnosis not present

## 2019-01-18 DIAGNOSIS — E119 Type 2 diabetes mellitus without complications: Secondary | ICD-10-CM | POA: Diagnosis not present

## 2019-01-18 DIAGNOSIS — M109 Gout, unspecified: Secondary | ICD-10-CM

## 2019-01-18 DIAGNOSIS — M1A0421 Idiopathic chronic gout, left hand, with tophus (tophi): Secondary | ICD-10-CM

## 2019-01-18 DIAGNOSIS — M89542 Osteolysis, left hand: Secondary | ICD-10-CM | POA: Diagnosis not present

## 2019-01-18 DIAGNOSIS — Z7901 Long term (current) use of anticoagulants: Secondary | ICD-10-CM | POA: Diagnosis not present

## 2019-01-18 DIAGNOSIS — L089 Local infection of the skin and subcutaneous tissue, unspecified: Secondary | ICD-10-CM | POA: Diagnosis not present

## 2019-01-18 LAB — CBC WITH DIFFERENTIAL/PLATELET
Abs Immature Granulocytes: 0.05 10*3/uL (ref 0.00–0.07)
Basophils Absolute: 0 10*3/uL (ref 0.0–0.1)
Basophils Relative: 1 %
Eosinophils Absolute: 0.1 10*3/uL (ref 0.0–0.5)
Eosinophils Relative: 1 %
HCT: 33.5 % — ABNORMAL LOW (ref 36.0–46.0)
Hemoglobin: 10.3 g/dL — ABNORMAL LOW (ref 12.0–15.0)
Immature Granulocytes: 1 %
Lymphocytes Relative: 13 %
Lymphs Abs: 1 10*3/uL (ref 0.7–4.0)
MCH: 28.1 pg (ref 26.0–34.0)
MCHC: 30.7 g/dL (ref 30.0–36.0)
MCV: 91.3 fL (ref 80.0–100.0)
Monocytes Absolute: 0.6 10*3/uL (ref 0.1–1.0)
Monocytes Relative: 8 %
Neutro Abs: 6 10*3/uL (ref 1.7–7.7)
Neutrophils Relative %: 76 %
Platelets: 235 10*3/uL (ref 150–400)
RBC: 3.67 MIL/uL — ABNORMAL LOW (ref 3.87–5.11)
RDW: 13.6 % (ref 11.5–15.5)
WBC: 7.7 10*3/uL (ref 4.0–10.5)
nRBC: 0 % (ref 0.0–0.2)

## 2019-01-18 LAB — BASIC METABOLIC PANEL
Anion gap: 13 (ref 5–15)
BUN: 53 mg/dL — ABNORMAL HIGH (ref 8–23)
CO2: 23 mmol/L (ref 22–32)
Calcium: 9.4 mg/dL (ref 8.9–10.3)
Chloride: 105 mmol/L (ref 98–111)
Creatinine, Ser: 2.26 mg/dL — ABNORMAL HIGH (ref 0.44–1.00)
GFR calc Af Amer: 22 mL/min — ABNORMAL LOW (ref >60)
GFR calc non Af Amer: 19 mL/min — ABNORMAL LOW (ref >60)
Glucose, Bld: 276 mg/dL — ABNORMAL HIGH (ref 70–99)
Potassium: 3.7 mmol/L (ref 3.5–5.1)
Sodium: 141 mmol/L (ref 135–145)

## 2019-01-18 LAB — SEDIMENTATION RATE: Sed Rate: 49 mm/hr — ABNORMAL HIGH (ref 0–22)

## 2019-01-18 MED ORDER — POVIDONE-IODINE 10 % EX SOLN
CUTANEOUS | Status: AC
  Filled 2019-01-18: qty 15

## 2019-01-18 MED ORDER — HYDROMORPHONE HCL 1 MG/ML IJ SOLN
0.5000 mg | Freq: Once | INTRAMUSCULAR | Status: AC
  Administered 2019-01-18: 0.5 mg via INTRAVENOUS
  Filled 2019-01-18: qty 1

## 2019-01-18 MED ORDER — HYDROCODONE-ACETAMINOPHEN 5-325 MG PO TABS: 1.0000 | ORAL_TABLET | Freq: Four times a day (QID) | ORAL | 0 refills | Status: DC | PRN

## 2019-01-18 MED ORDER — CEPHALEXIN 250 MG PO CAPS: 250.0000 mg | ORAL_CAPSULE | Freq: Three times a day (TID) | ORAL | 0 refills | Status: DC

## 2019-01-18 MED ORDER — CEPHALEXIN 250 MG PO CAPS
250.0000 mg | ORAL_CAPSULE | Freq: Once | ORAL | Status: AC
  Administered 2019-01-18: 250 mg via ORAL
  Filled 2019-01-18 (×2): qty 1

## 2019-01-18 MED ORDER — LIDOCAINE HCL (PF) 1 % IJ SOLN
INTRAMUSCULAR | Status: AC
  Filled 2019-01-18: qty 4

## 2019-01-18 MED ORDER — LIDOCAINE HCL (PF) 1 % IJ SOLN
INTRAMUSCULAR | Status: AC
  Filled 2019-01-18: qty 2

## 2019-01-18 NOTE — ED Notes (Signed)
Dr. Aline Brochure at bedside doing an I&D

## 2019-01-18 NOTE — Discharge Instructions (Signed)
Follow-up with Dr Aline Brochure as he recommended.

## 2019-01-18 NOTE — Consult Note (Addendum)
Patient ID: Courtney Bradford, female   DOB: 1931/08/11, 85 y.o.   MRN: [id number] Consult requested by emergency room physician Dr. Rod Can  Chief Complaint  Patient presents with  . Wound Infection    HPI Courtney Bradford is a 85 y.o. female.  Presents for evaluation of her left index finger.  The patient has a son who is a Marine scientist and has been following her finger with swelling but no pain or drainage for approximately 1 year however over the weekend the finger started to increase in swelling with increased pain and drainage she was brought in.  She is a borderline diabetic she is on insulin.  She has a history of gout.  Location left index finger Duration swelling for year pain and drainage for 1 day to 2 days Severity moderate pain Quality dull pain Modified by motion and movement   Review of Systems (at least 2) ROS  No history of fever  No history of trauma no history of injury to the finger no history of tingling in the finger  Past Medical History:  Diagnosis Date  . A-fib ([place])   . Anemia   . Arthritis    right knee  . Cataract   . CHF (congestive heart failure) ([place])   . CKD (chronic kidney disease), stage III ([place])   . Dehydration   . Diabetes mellitus   . Gout   . Headache(784.0)   . Hypertension   . UTI (lower urinary tract infection)     Past Surgical History:  Procedure Laterality Date  . ABDOMINAL HYSTERECTOMY    . ANTERIOR AND POSTERIOR REPAIR  05/17/2011   Procedure: ANTERIOR (CYSTOCELE) AND POSTERIOR REPAIR (RECTOCELE);  Surgeon: Eli Hose, MD;  Location: [facility];  Service: Gynecology;  Laterality: N/A;  Anterior repair with TVT bladder sling and cystoscopy  . BLADDER SUSPENSION  05/17/2011   Procedure: TRANSVAGINAL TAPE (TVT) PROCEDURE;  Surgeon: Eli Hose, MD;  Location: [facility];  Service: Gynecology;  Laterality: N/A;  . BREAST SURGERY     breast biopsy    Social History   Tobacco Use  . Smoking status: Never Smoker  . Smokeless  tobacco: Never Used  Substance Use Topics  . Alcohol use: No  . Drug use: No    Allergies  Allergen Reactions  . Lactose Intolerance (Gi) Other (See Comments)    Upset stomach    Current Facility-Administered Medications  Medication Dose Route Frequency Provider Last Rate Last Dose  . lidocaine (PF) (XYLOCAINE) 1 % injection           . lidocaine (PF) (XYLOCAINE) 1 % injection           . lidocaine (PF) (XYLOCAINE) 1 % injection           . povidone-iodine (BETADINE) 10 % external solution            Current Outpatient Medications  Medication Sig Dispense Refill  . amLODipine (NORVASC) 5 MG tablet Take 5 mg by mouth daily.      Marland Kitchen apixaban (ELIQUIS) 5 MG TABS tablet Take 1 tablet (5 mg total) by mouth 2 (two) times daily. 60 tablet 1  . cefUROXime (CEFTIN) 250 MG tablet Take 1 tablet (250 mg total) by mouth 2 (two) times daily with a meal. 10 tablet 0  . cloNIDine (CATAPRES - DOSED IN MG/24 HR) 0.3 mg/24hr patch Place 0.3 mg onto the skin every Friday.     . colchicine 0.6 MG tablet Take 1  tablet (0.6 mg total) by mouth daily. 14 tablet 0  . ferrous fumarate (HEMOCYTE - 106 MG FE) 325 (106 FE) MG TABS tablet Take 1 tablet by mouth every morning.     . furosemide (LASIX) 20 MG tablet Take 20 mg by mouth daily.    . insulin regular (HUMULIN R) 100 units/mL injection Inject 10 Units into the skin every morning.      . metoprolol tartrate (LOPRESSOR) 25 mg/10 mL SUSP Take 2.5 mLs (6.25 mg total) by mouth daily. 20 mL 0  . Olopatadine HCl 0.2 % SOLN Place 1 drop into both eyes daily as needed (watery eyes).     . predniSONE (DELTASONE) 10 MG tablet Take  40mg  (4 tabs) x 2 days, then taper to 30mg  (3 tabs) x 2 days, then 20mg  (2 tabs) x  2days, then 10mg  (1 tab) x 2 days, then OFF. 20 tablet 0      Physical Exam-Detailed Physical Exam  Blood pressure (!) 153/58, pulse 77, temperature 98.9 F (37.2 C), temperature source Oral, resp. rate 16, height 5\' 5"  (1.651 m), weight 73.5 kg,  SpO2 100 %. Gen. appearance small fracme  Cardiovascular exam the pulses are 2+ with no peripheral edema  The ambulatory status is minimal max assist   Extremity examined right hand   Inspection Range of motion assessment full range of motion as recorded Stability assessment stability test reveal no instability or laxity Muscle strength and muscle tone are normal with no atrophy or tremors Skin there are no scars rashes lesions or lacerations  Sensation to touch is normal The patient is oriented to person place and time The patient's mood and affect show no depression or anxiety or agitation  For comparison the opposite extremity left hand/index finger   was examined and the following findings were noted  Large mass overlying the dip joint of the left index finger with white possible tophus beneath the eponychial fold it is fluctuant and the ext and flex tendons are intact, sensation is normal color normal   MEDICAL DECISION MAKING  Data Reviewed  I have personally reviewed the imaging studies and the report and my interpretation is:  Destruction of the distal phal and dip joint and involvement of the distal middle phal   Assessment and Plan  Partial excision diaphysis ectomy of bone distal phalanx Verbal consent was given, site confirmed Digital block 20 cc lidocaine 1% Dx: infection dip joint left index finger       Post op dx: gout infiltration of dip joint left index finger   A fishmouth incision was made over the distal index finger, this was carried down to extensor tendon; the joint capsule was distroyed by extensive tophus formation and there was only a small amount of bone left, which was excised  The joint was irrigated and incisional debridement was done to remove the tophus  A culture was obtained.  The dressing require quick stat to control hemorrhage along with pressure   Plan f/u Monday for dressing change Discharge on prophylactic keflex     Arther Abbott 01/18/2019, 3:51 PM   Carole Civil MD

## 2019-01-18 NOTE — ED Notes (Signed)
Pt to xray

## 2019-01-18 NOTE — ED Notes (Signed)
Pt back from x-ray.

## 2019-01-18 NOTE — ED Provider Notes (Signed)
[facility] EMERGENCY DEPARTMENT Provider Note   CSN: [id number] Arrival date & time: 01/18/19  1244    History   Chief Complaint Chief Complaint  Patient presents with  . Wound Infection    HPI Courtney Bradford is a 85 y.o. female.  HPI   87yF with drainage from L index finger. Has been swollen for about a month and drainage in last couple days. Fever a few days ago but resolved by the following day. No known trauma. She denies pain. History supplemented by her son.   Past Medical History:  Diagnosis Date  . A-fib ([place])   . Anemia   . Arthritis    right knee  . Cataract   . CHF (congestive heart failure) ([place])   . CKD (chronic kidney disease), stage III ([place])   . Dehydration   . Diabetes mellitus   . Gout   . Headache(784.0)   . Hypertension   . UTI (lower urinary tract infection)     Patient Active Problem List   Diagnosis Date Noted  . Acute encephalopathy 02/28/2016  . UTI (lower urinary tract infection)   . Generalized weakness 11/11/2015  . Acute renal failure superimposed on stage 3 chronic kidney disease ([place]) 11/11/2015  . Hyperkalemia 11/11/2015  . A-fib ([place]) 11/11/2015  . Hypokalemia 11/11/2015  . Gout   . UTI (urinary tract infection) 07/23/2014  . Acute on chronic diastolic heart failure ([place]) 07/22/2014  . Atrial fibrillation, chronic ([place]) 07/22/2014  . Diabetes mellitus type 2, controlled ([place]) 07/22/2014  . Leg pain, bilateral 07/22/2014  . SUI (stress urinary incontinence, female) 05/16/2011  . Pelvic relaxation 05/16/2011  . ANEMIA 11/03/2009  . Essential hypertension 11/03/2009  . PREMATURE VENTRICULAR CONTRACTIONS 11/03/2009  . DYSPNEA 11/03/2009    Past Surgical History:  Procedure Laterality Date  . ABDOMINAL HYSTERECTOMY    . ANTERIOR AND POSTERIOR REPAIR  05/17/2011   Procedure: ANTERIOR (CYSTOCELE) AND POSTERIOR REPAIR (RECTOCELE);  Surgeon: Eli Hose, MD;  Location: [facility];  Service: Gynecology;  Laterality: N/A;   Anterior repair with TVT bladder sling and cystoscopy  . BLADDER SUSPENSION  05/17/2011   Procedure: TRANSVAGINAL TAPE (TVT) PROCEDURE;  Surgeon: Eli Hose, MD;  Location: [facility];  Service: Gynecology;  Laterality: N/A;  . BREAST SURGERY     breast biopsy     OB History   No obstetric history on file.      Home Medications    Prior to Admission medications   Medication Sig Start Date End Date Taking? Authorizing Provider  amLODipine (NORVASC) 5 MG tablet Take 5 mg by mouth daily.      [provider]  apixaban (ELIQUIS) 5 MG TABS tablet Take 1 tablet (5 mg total) by mouth 2 (two) times daily. 07/31/14   Hosie Poisson, MD  cefUROXime (CEFTIN) 250 MG tablet Take 1 tablet (250 mg total) by mouth 2 (two) times daily with a meal. 03/02/16   Hollice Gong, Mir Earlie Server, MD  cloNIDine (CATAPRES - DOSED IN MG/24 HR) 0.3 mg/24hr patch Place 0.3 mg onto the skin every Friday.     [provider]  colchicine 0.6 MG tablet Take 1 tablet (0.6 mg total) by mouth daily. 03/05/16   Hollice Gong, Mir Mohammed, MD  ferrous fumarate (HEMOCYTE - 106 MG FE) 325 (106 FE) MG TABS tablet Take 1 tablet by mouth every morning.     [provider]  furosemide (LASIX) 20 MG tablet Take 20 mg by mouth daily.  [provider]  insulin regular (HUMULIN R) 100 units/mL injection Inject 10 Units into the skin every morning.      [provider]  metoprolol tartrate (LOPRESSOR) 25 mg/10 mL SUSP Take 2.5 mLs (6.25 mg total) by mouth daily. 03/02/16   Hollice Gong, Mir Mohammed, MD  Olopatadine HCl 0.2 % SOLN Place 1 drop into both eyes daily as needed (watery eyes).     [provider]  predniSONE (DELTASONE) 10 MG tablet Take  40mg  (4 tabs) x 2 days, then taper to 30mg  (3 tabs) x 2 days, then 20mg  (2 tabs) x  2days, then 10mg  (1 tab) x 2 days, then OFF. 11/14/15   Cristal Ford, DO    Family History No family history on file.  Social History Social History    Tobacco Use  . Smoking status: Never Smoker  . Smokeless tobacco: Never Used  Substance Use Topics  . Alcohol use: No  . Drug use: No     Allergies   Lactose intolerance (gi)   Review of Systems Review of Systems  All systems reviewed and negative, other than as noted in HPI.  Physical Exam Updated Vital Signs BP (!) 153/58 (BP Location: Left Arm)   Pulse 77   Temp 98.9 F (37.2 C) (Oral)   Resp 16   Ht 5\' 5"  (1.651 m)   Wt 73.5 kg   SpO2 100%   BMI 26.96 kg/m   Physical Exam Vitals signs and nursing note reviewed.  Constitutional:      General: She is not in acute distress.    Appearance: She is well-developed.  HENT:     Head: Normocephalic and atraumatic.  Eyes:     General:        Right eye: No discharge.        Left eye: No discharge.     Conjunctiva/sclera: Conjunctivae normal.  Neck:     Musculoskeletal: Neck supple.  Cardiovascular:     Rate and Rhythm: Normal rate and regular rhythm.     Heart sounds: Normal heart sounds. No murmur. No friction rub. No gallop.   Pulmonary:     Effort: Pulmonary effort is normal. No respiratory distress.     Breath sounds: Normal breath sounds.  Abdominal:     General: There is no distension.     Palpations: Abdomen is soft.     Tenderness: There is no abdominal tenderness.  Musculoskeletal:        General: Swelling and deformity present.     Comments: R index finger very swollen distally from middle phalanx into tip of finger. Appears to have purulence beneath skin but no active drainage noted.   Skin:    General: Skin is warm and dry.  Neurological:     Mental Status: She is alert.  Psychiatric:        Behavior: Behavior normal.        Thought Content: Thought content normal.      ED Treatments / Results  Labs (all labs ordered are listed, but only abnormal results are displayed) Labs Reviewed  CBC WITH DIFFERENTIAL/PLATELET - Abnormal; Notable for the following components:      Result Value   RBC  3.67 (*)    Hemoglobin 10.3 (*)    HCT 33.5 (*)    All other components within normal limits  BASIC METABOLIC PANEL - Abnormal; Notable for the following components:   Glucose, Bld 276 (*)    BUN 53 (*)  Creatinine, Ser 2.26 (*)    GFR calc non Af Amer 19 (*)    GFR calc Af Amer 22 (*)    All other components within normal limits  SEDIMENTATION RATE - Abnormal; Notable for the following components:   Sed Rate 49 (*)    All other components within normal limits  C-REACTIVE PROTEIN - Abnormal; Notable for the following components:   CRP 3.7 (*)    All other components within normal limits  AEROBIC CULTURE (SUPERFICIAL SPECIMEN)    EKG None  Radiology Dg Finger Index Left  Result Date: 01/18/2019 CLINICAL DATA:  Chronic left index finger infection which began draining yesterday. EXAM: LEFT INDEX FINGER 2+V COMPARISON:  None. FINDINGS: Soft tissues of the index finger are swollen. The distal phalanx is not visualized. There is incomplete osteolysis of the middle phalanx extending from the proximal diaphysis distally. No radiopaque foreign body or soft tissue gas. Chondrocalcinosis about the imaged MCP joints noted. IMPRESSION: Marked soft tissue swelling of the index finger with complete osteolysis of the distal phalanx and partial osteolysis of the middle phalanx consistent with osteomyelitis. Electronically Signed   By: Inge Rise M.D.   On: 01/18/2019 13:34    Procedures Procedures (including critical care time)  Medications Ordered in ED Medications - No data to display   Initial Impression / Assessment and Plan / ED Course  I have reviewed the triage vital signs and the nursing notes.  Pertinent labs & imaging results that were available during my care of the patient were reviewed by me and considered in my medical decision making (see chart for details).   85 year old female with right finger swelling.  X-ray consistent with osteomyelitis.  The distal phalanx is  completely obliterated and significant involvement of the middle phalanx as well. Will discuss with ortho/hand.   Dr Aline Brochure, ortho/hand surgery consulted. Surgical care provided in the ED. Keflex prophylactically. PRN pain meds. FU with Dr Aline Brochure in the office this upcoming week.   Final Clinical Impressions(s) / ED Diagnoses   Final diagnoses:  Osteolysis of right index finger  Gout of right hand, unspecified cause, unspecified chronicity    ED Discharge Orders    None       Virgel Manifold, MD 01/19/19 [phone]

## 2019-01-18 NOTE — ED Triage Notes (Signed)
Patient c/o left index finger infection that started draining yesterday. Patient has had swelling in joints x2 months with signs of infection noted yesterday. Per family patient has serosanguinous drainage. Denies any fevers.

## 2019-01-18 NOTE — ED Notes (Signed)
Pt has extensive swelling to distal phalanx of right index finger.

## 2019-01-18 NOTE — ED Notes (Signed)
Pt's son asking for pain medication. Have notified MD

## 2019-01-18 NOTE — ED Notes (Signed)
Dr Harrison at bedside

## 2019-01-19 LAB — C-REACTIVE PROTEIN: CRP: 3.7 mg/dL — ABNORMAL HIGH (ref <1.0)

## 2019-01-20 ENCOUNTER — Ambulatory Visit: Payer: Self-pay | Admitting: Orthopedic Surgery

## 2019-01-20 ENCOUNTER — Telehealth: Payer: Self-pay | Admitting: Orthopedic Surgery

## 2019-01-21 ENCOUNTER — Encounter: Payer: Self-pay | Admitting: Orthopedic Surgery

## 2019-01-22 ENCOUNTER — Encounter (HOSPITAL_COMMUNITY): Payer: Self-pay | Admitting: *Deleted

## 2019-01-22 ENCOUNTER — Other Ambulatory Visit: Payer: Self-pay

## 2019-01-22 ENCOUNTER — Inpatient Hospital Stay (HOSPITAL_COMMUNITY): Payer: Medicare Other | Admitting: Certified Registered"

## 2019-01-22 ENCOUNTER — Encounter: Payer: Self-pay | Admitting: Orthopedic Surgery

## 2019-01-22 ENCOUNTER — Emergency Department (HOSPITAL_COMMUNITY): Payer: Medicare Other

## 2019-01-22 ENCOUNTER — Inpatient Hospital Stay (HOSPITAL_COMMUNITY)
Admission: EM | Admit: 2019-01-22 | Discharge: 2019-01-25 | DRG: 853 | Disposition: A | Discharge status: Home Health | Payer: Medicare Other | Attending: Internal Medicine | Admitting: Internal Medicine

## 2019-01-22 ENCOUNTER — Ambulatory Visit (INDEPENDENT_AMBULATORY_CARE_PROVIDER_SITE_OTHER): Payer: Medicare Other | Admitting: Orthopedic Surgery

## 2019-01-22 ENCOUNTER — Encounter (HOSPITAL_COMMUNITY)
Admission: EM | Disposition: A | Discharge status: Home Health | Payer: Self-pay | Source: Home / Self Care | Attending: Internal Medicine

## 2019-01-22 DIAGNOSIS — I4430 Unspecified atrioventricular block: Secondary | ICD-10-CM | POA: Diagnosis not present

## 2019-01-22 DIAGNOSIS — I5032 Chronic diastolic (congestive) heart failure: Secondary | ICD-10-CM | POA: Diagnosis present

## 2019-01-22 DIAGNOSIS — I495 Sick sinus syndrome: Secondary | ICD-10-CM | POA: Diagnosis present

## 2019-01-22 DIAGNOSIS — I083 Combined rheumatic disorders of mitral, aortic and tricuspid valves: Secondary | ICD-10-CM | POA: Diagnosis present

## 2019-01-22 DIAGNOSIS — I4892 Unspecified atrial flutter: Secondary | ICD-10-CM | POA: Diagnosis not present

## 2019-01-22 DIAGNOSIS — E739 Lactose intolerance, unspecified: Secondary | ICD-10-CM | POA: Diagnosis present

## 2019-01-22 DIAGNOSIS — L089 Local infection of the skin and subcutaneous tissue, unspecified: Secondary | ICD-10-CM | POA: Diagnosis not present

## 2019-01-22 DIAGNOSIS — A419 Sepsis, unspecified organism: Principal | ICD-10-CM | POA: Diagnosis present

## 2019-01-22 DIAGNOSIS — R0602 Shortness of breath: Secondary | ICD-10-CM | POA: Diagnosis present

## 2019-01-22 DIAGNOSIS — M89542 Osteolysis, left hand: Secondary | ICD-10-CM | POA: Diagnosis not present

## 2019-01-22 DIAGNOSIS — E1122 Type 2 diabetes mellitus with diabetic chronic kidney disease: Secondary | ICD-10-CM | POA: Diagnosis present

## 2019-01-22 DIAGNOSIS — D649 Anemia, unspecified: Secondary | ICD-10-CM | POA: Diagnosis not present

## 2019-01-22 DIAGNOSIS — E872 Acidosis, unspecified: Secondary | ICD-10-CM

## 2019-01-22 DIAGNOSIS — E1136 Type 2 diabetes mellitus with diabetic cataract: Secondary | ICD-10-CM | POA: Diagnosis not present

## 2019-01-22 DIAGNOSIS — E119 Type 2 diabetes mellitus without complications: Secondary | ICD-10-CM

## 2019-01-22 DIAGNOSIS — B9562 Methicillin resistant Staphylococcus aureus infection as the cause of diseases classified elsewhere: Secondary | ICD-10-CM | POA: Diagnosis present

## 2019-01-22 DIAGNOSIS — N183 Chronic kidney disease, stage 3 (moderate): Secondary | ICD-10-CM | POA: Diagnosis not present

## 2019-01-22 DIAGNOSIS — E118 Type 2 diabetes mellitus with unspecified complications: Secondary | ICD-10-CM | POA: Diagnosis not present

## 2019-01-22 DIAGNOSIS — D631 Anemia in chronic kidney disease: Secondary | ICD-10-CM | POA: Diagnosis not present

## 2019-01-22 DIAGNOSIS — Z794 Long term (current) use of insulin: Secondary | ICD-10-CM

## 2019-01-22 DIAGNOSIS — M1A0411 Idiopathic chronic gout, right hand, with tophus (tophi): Secondary | ICD-10-CM | POA: Diagnosis not present

## 2019-01-22 DIAGNOSIS — I480 Paroxysmal atrial fibrillation: Secondary | ICD-10-CM | POA: Diagnosis not present

## 2019-01-22 DIAGNOSIS — I351 Nonrheumatic aortic (valve) insufficiency: Secondary | ICD-10-CM | POA: Diagnosis not present

## 2019-01-22 DIAGNOSIS — I482 Chronic atrial fibrillation, unspecified: Secondary | ICD-10-CM | POA: Diagnosis present

## 2019-01-22 DIAGNOSIS — I441 Atrioventricular block, second degree: Secondary | ICD-10-CM | POA: Diagnosis present

## 2019-01-22 DIAGNOSIS — M1A9XX1 Chronic gout, unspecified, with tophus (tophi): Secondary | ICD-10-CM | POA: Diagnosis not present

## 2019-01-22 DIAGNOSIS — N390 Urinary tract infection, site not specified: Secondary | ICD-10-CM | POA: Diagnosis not present

## 2019-01-22 DIAGNOSIS — Z1639 Resistance to other specified antimicrobial drug: Secondary | ICD-10-CM | POA: Diagnosis present

## 2019-01-22 DIAGNOSIS — I361 Nonrheumatic tricuspid (valve) insufficiency: Secondary | ICD-10-CM | POA: Diagnosis not present

## 2019-01-22 DIAGNOSIS — Z7901 Long term (current) use of anticoagulants: Secondary | ICD-10-CM

## 2019-01-22 DIAGNOSIS — M2580 Other specified joint disorders, unspecified joint: Secondary | ICD-10-CM | POA: Diagnosis not present

## 2019-01-22 DIAGNOSIS — M109 Gout, unspecified: Secondary | ICD-10-CM | POA: Diagnosis not present

## 2019-01-22 DIAGNOSIS — L03012 Cellulitis of left finger: Secondary | ICD-10-CM | POA: Diagnosis not present

## 2019-01-22 DIAGNOSIS — I340 Nonrheumatic mitral (valve) insufficiency: Secondary | ICD-10-CM | POA: Diagnosis not present

## 2019-01-22 DIAGNOSIS — S61209A Unspecified open wound of unspecified finger without damage to nail, initial encounter: Secondary | ICD-10-CM

## 2019-01-22 DIAGNOSIS — M79645 Pain in left finger(s): Secondary | ICD-10-CM | POA: Diagnosis not present

## 2019-01-22 DIAGNOSIS — M1711 Unilateral primary osteoarthritis, right knee: Secondary | ICD-10-CM | POA: Diagnosis present

## 2019-01-22 DIAGNOSIS — I5033 Acute on chronic diastolic (congestive) heart failure: Secondary | ICD-10-CM | POA: Diagnosis not present

## 2019-01-22 DIAGNOSIS — R262 Difficulty in walking, not elsewhere classified: Secondary | ICD-10-CM | POA: Diagnosis not present

## 2019-01-22 DIAGNOSIS — R001 Bradycardia, unspecified: Secondary | ICD-10-CM | POA: Diagnosis not present

## 2019-01-22 DIAGNOSIS — N179 Acute kidney failure, unspecified: Secondary | ICD-10-CM

## 2019-01-22 DIAGNOSIS — Z79899 Other long term (current) drug therapy: Secondary | ICD-10-CM

## 2019-01-22 DIAGNOSIS — Z90710 Acquired absence of both cervix and uterus: Secondary | ICD-10-CM

## 2019-01-22 DIAGNOSIS — E1129 Type 2 diabetes mellitus with other diabetic kidney complication: Secondary | ICD-10-CM

## 2019-01-22 DIAGNOSIS — I130 Hypertensive heart and chronic kidney disease with heart failure and stage 1 through stage 4 chronic kidney disease, or unspecified chronic kidney disease: Secondary | ICD-10-CM | POA: Diagnosis present

## 2019-01-22 HISTORY — PX: AMPUTATION FINGER: SHX6594

## 2019-01-22 HISTORY — PX: I & D EXTREMITY: SHX5045

## 2019-01-22 LAB — BASIC METABOLIC PANEL
Anion gap: 13 (ref 5–15)
BUN: 86 mg/dL — ABNORMAL HIGH (ref 8–23)
CO2: 19 mmol/L — ABNORMAL LOW (ref 22–32)
Calcium: 9.6 mg/dL (ref 8.9–10.3)
Chloride: 103 mmol/L (ref 98–111)
Creatinine, Ser: 2.85 mg/dL — ABNORMAL HIGH (ref 0.44–1.00)
GFR calc Af Amer: 17 mL/min — ABNORMAL LOW (ref >60)
GFR calc non Af Amer: 14 mL/min — ABNORMAL LOW (ref >60)
Glucose, Bld: 176 mg/dL — ABNORMAL HIGH (ref 70–99)
Potassium: 5 mmol/L (ref 3.5–5.1)
Sodium: 135 mmol/L (ref 135–145)

## 2019-01-22 LAB — CBC WITH DIFFERENTIAL/PLATELET
Abs Immature Granulocytes: 0.09 10*3/uL — ABNORMAL HIGH (ref 0.00–0.07)
BASOS ABS: 0 10*3/uL (ref 0.0–0.1)
Basophils Relative: 1 %
EOS ABS: 0.1 10*3/uL (ref 0.0–0.5)
Eosinophils Relative: 1 %
HCT: 33 % — ABNORMAL LOW (ref 36.0–46.0)
Hemoglobin: 10.2 g/dL — ABNORMAL LOW (ref 12.0–15.0)
Immature Granulocytes: 1 %
Lymphocytes Relative: 11 %
Lymphs Abs: 0.9 10*3/uL (ref 0.7–4.0)
MCH: 28 pg (ref 26.0–34.0)
MCHC: 30.9 g/dL (ref 30.0–36.0)
MCV: 90.7 fL (ref 80.0–100.0)
Monocytes Absolute: 0.5 10*3/uL (ref 0.1–1.0)
Monocytes Relative: 6 %
NEUTROS PCT: 80 %
Neutro Abs: 7 10*3/uL (ref 1.7–7.7)
Platelets: 294 10*3/uL (ref 150–400)
RBC: 3.64 MIL/uL — ABNORMAL LOW (ref 3.87–5.11)
RDW: 13.4 % (ref 11.5–15.5)
WBC: 8.6 10*3/uL (ref 4.0–10.5)
nRBC: 0 % (ref 0.0–0.2)

## 2019-01-22 LAB — GLUCOSE, CAPILLARY
Glucose-Capillary: 187 mg/dL — ABNORMAL HIGH (ref 70–99)
Glucose-Capillary: 114 mg/dL — ABNORMAL HIGH (ref 70–99)
Glucose-Capillary: 102 mg/dL — ABNORMAL HIGH (ref 70–99)

## 2019-01-22 LAB — LACTIC ACID, PLASMA: LACTIC ACID, VENOUS: 2.8 mmol/L — AB (ref 0.5–1.9)

## 2019-01-22 LAB — AEROBIC CULTURE W GRAM STAIN (SUPERFICIAL SPECIMEN)

## 2019-01-22 LAB — AEROBIC CULTURE  (SUPERFICIAL SPECIMEN)

## 2019-01-22 SURGERY — IRRIGATION AND DEBRIDEMENT EXTREMITY
Anesthesia: Monitor Anesthesia Care | Site: Finger | Laterality: Left

## 2019-01-22 MED ORDER — METOPROLOL TARTRATE 12.5 MG HALF TABLET
12.5000 mg | ORAL_TABLET | Freq: Every day | ORAL | Status: DC
  Administered 2019-01-23: 12.5 mg via ORAL
  Filled 2019-01-22: qty 1

## 2019-01-22 MED ORDER — SODIUM CHLORIDE 0.9 % IV SOLN: INTRAVENOUS | Status: DC

## 2019-01-22 MED ORDER — COLCHICINE 0.6 MG PO TABS
0.6000 mg | ORAL_TABLET | Freq: Every day | ORAL | Status: DC
  Administered 2019-01-22 – 2019-01-23 (×2): 0.6 mg via ORAL
  Filled 2019-01-22 (×2): qty 1

## 2019-01-22 MED ORDER — OXYCODONE HCL 5 MG PO TABS: 5.0000 mg | ORAL_TABLET | Freq: Once | ORAL | Status: DC | PRN

## 2019-01-22 MED ORDER — OXYCODONE HCL 5 MG/5ML PO SOLN: 5.0000 mg | Freq: Once | ORAL | Status: DC | PRN

## 2019-01-22 MED ORDER — METOPROLOL TARTRATE 25 MG/10 ML ORAL SUSPENSION: 6.2500 mg | Freq: Every day | ORAL | Status: DC

## 2019-01-22 MED ORDER — MORPHINE SULFATE (PF) 2 MG/ML IV SOLN
2.0000 mg | Freq: Once | INTRAVENOUS | Status: AC
  Administered 2019-01-22: 2 mg via INTRAVENOUS
  Filled 2019-01-22: qty 1

## 2019-01-22 MED ORDER — ONDANSETRON HCL 4 MG/2ML IJ SOLN
INTRAMUSCULAR | Status: DC | PRN
  Administered 2019-01-22: 4 mg via INTRAVENOUS

## 2019-01-22 MED ORDER — ONDANSETRON HCL 4 MG/2ML IJ SOLN
4.0000 mg | Freq: Once | INTRAMUSCULAR | Status: AC
  Administered 2019-01-22: 4 mg via INTRAVENOUS
  Filled 2019-01-22: qty 2

## 2019-01-22 MED ORDER — SUCCINYLCHOLINE CHLORIDE 200 MG/10ML IV SOSY
PREFILLED_SYRINGE | INTRAVENOUS | Status: AC
  Filled 2019-01-22: qty 10

## 2019-01-22 MED ORDER — PHENYLEPHRINE 40 MCG/ML (10ML) SYRINGE FOR IV PUSH (FOR BLOOD PRESSURE SUPPORT)
PREFILLED_SYRINGE | INTRAVENOUS | Status: AC
  Filled 2019-01-22: qty 10

## 2019-01-22 MED ORDER — CHLORHEXIDINE GLUCONATE 4 % EX LIQD: 60.0000 mL | Freq: Once | CUTANEOUS | Status: DC

## 2019-01-22 MED ORDER — SODIUM CHLORIDE 0.9 % IV SOLN
INTRAVENOUS | Status: DC
  Administered 2019-01-22: 20:00:00 via INTRAVENOUS

## 2019-01-22 MED ORDER — HYDROCODONE-ACETAMINOPHEN 5-325 MG PO TABS: 1.0000 | ORAL_TABLET | ORAL | Status: DC | PRN

## 2019-01-22 MED ORDER — FENTANYL CITRATE (PF) 100 MCG/2ML IJ SOLN: 25.0000 ug | INTRAMUSCULAR | Status: DC | PRN

## 2019-01-22 MED ORDER — PIPERACILLIN-TAZOBACTAM 3.375 G IVPB 30 MIN
3.3750 g | Freq: Once | INTRAVENOUS | Status: AC
  Administered 2019-01-22: 3.375 g via INTRAVENOUS
  Filled 2019-01-22: qty 50

## 2019-01-22 MED ORDER — APIXABAN 5 MG PO TABS
5.0000 mg | ORAL_TABLET | Freq: Two times a day (BID) | ORAL | Status: DC
  Administered 2019-01-23: 5 mg via ORAL
  Filled 2019-01-22: qty 1

## 2019-01-22 MED ORDER — BUPIVACAINE HCL (PF) 0.25 % IJ SOLN
INTRAMUSCULAR | Status: AC
  Filled 2019-01-22: qty 30

## 2019-01-22 MED ORDER — POVIDONE-IODINE 10 % EX SWAB: 2.0000 "application " | Freq: Once | CUTANEOUS | Status: DC

## 2019-01-22 MED ORDER — FERROUS FUMARATE 324 (106 FE) MG PO TABS
1.0000 | ORAL_TABLET | Freq: Every morning | ORAL | Status: DC
  Administered 2019-01-23 – 2019-01-25 (×3): 106 mg via ORAL
  Filled 2019-01-22 (×3): qty 1

## 2019-01-22 MED ORDER — VANCOMYCIN HCL IN DEXTROSE 1-5 GM/200ML-% IV SOLN: 1000.0000 mg | INTRAVENOUS | Status: DC

## 2019-01-22 MED ORDER — BUPIVACAINE HCL (PF) 0.25 % IJ SOLN
INTRAMUSCULAR | Status: DC | PRN
  Administered 2019-01-22: 10 mL

## 2019-01-22 MED ORDER — PIPERACILLIN-TAZOBACTAM 3.375 G IVPB
3.3750 g | Freq: Three times a day (TID) | INTRAVENOUS | Status: DC
  Administered 2019-01-22 – 2019-01-23 (×2): 3.375 g via INTRAVENOUS
  Filled 2019-01-22 (×3): qty 50

## 2019-01-22 MED ORDER — PROPOFOL 500 MG/50ML IV EMUL
INTRAVENOUS | Status: DC | PRN
  Administered 2019-01-22: 50 ug/kg/min via INTRAVENOUS

## 2019-01-22 MED ORDER — SODIUM CHLORIDE 0.9 % IV SOLN
INTRAVENOUS | Status: DC
  Administered 2019-01-22: 17:00:00 via INTRAVENOUS

## 2019-01-22 MED ORDER — ACETAMINOPHEN 160 MG/5ML PO SOLN: 1000.0000 mg | Freq: Once | ORAL | Status: DC | PRN

## 2019-01-22 MED ORDER — ACETAMINOPHEN 10 MG/ML IV SOLN: 1000.0000 mg | Freq: Once | INTRAVENOUS | Status: DC | PRN

## 2019-01-22 MED ORDER — ACETAMINOPHEN 500 MG PO TABS: 1000.0000 mg | ORAL_TABLET | Freq: Once | ORAL | Status: DC | PRN

## 2019-01-22 MED ORDER — SODIUM CHLORIDE 0.9 % IV BOLUS: 500.0000 mL | Freq: Once | INTRAVENOUS | Status: DC

## 2019-01-22 MED ORDER — SODIUM CHLORIDE 0.9 % IR SOLN
Status: DC | PRN
  Administered 2019-01-22: 1

## 2019-01-22 MED ORDER — HEPARIN SODIUM (PORCINE) 5000 UNIT/ML IJ SOLN: 5000.0000 [IU] | Freq: Three times a day (TID) | INTRAMUSCULAR | Status: DC

## 2019-01-22 MED ORDER — OLOPATADINE HCL 0.1 % OP SOLN
1.0000 [drp] | Freq: Two times a day (BID) | OPHTHALMIC | Status: DC
  Administered 2019-01-22 – 2019-01-25 (×6): 1 [drp] via OPHTHALMIC
  Filled 2019-01-22: qty 5

## 2019-01-22 MED ORDER — ONDANSETRON HCL 4 MG/2ML IJ SOLN
INTRAMUSCULAR | Status: AC
  Filled 2019-01-22: qty 2

## 2019-01-22 MED ORDER — APIXABAN 5 MG PO TABS: 5.0000 mg | ORAL_TABLET | Freq: Two times a day (BID) | ORAL | Status: DC

## 2019-01-22 MED ORDER — LIDOCAINE 2% (20 MG/ML) 5 ML SYRINGE
INTRAMUSCULAR | Status: AC
  Filled 2019-01-22: qty 5

## 2019-01-22 MED ORDER — VANCOMYCIN HCL 10 G IV SOLR
1500.0000 mg | Freq: Once | INTRAVENOUS | Status: AC
  Administered 2019-01-22: 1500 mg via INTRAVENOUS
  Filled 2019-01-22: qty 1500

## 2019-01-22 SURGICAL SUPPLY — 57 items
BANDAGE ACE 3X5.8 VEL STRL LF (GAUZE/BANDAGES/DRESSINGS) ×3 IMPLANT
BANDAGE ACE 4X5 VEL STRL LF (GAUZE/BANDAGES/DRESSINGS) ×3 IMPLANT
BANDAGE COBAN LF 1.5X5 NS (GAUZE/BANDAGES/DRESSINGS) ×2 IMPLANT
BNDG CMPR 9X4 STRL LF SNTH (GAUZE/BANDAGES/DRESSINGS) ×1
BNDG COHESIVE 1X5 TAN STRL LF (GAUZE/BANDAGES/DRESSINGS) IMPLANT
BNDG CONFORM 2 STRL LF (GAUZE/BANDAGES/DRESSINGS) ×2 IMPLANT
BNDG ESMARK 4X9 LF (GAUZE/BANDAGES/DRESSINGS) ×3 IMPLANT
BNDG GAUZE ELAST 4 BULKY (GAUZE/BANDAGES/DRESSINGS) ×3 IMPLANT
CORDS BIPOLAR (ELECTRODE) ×3 IMPLANT
COVER SURGICAL LIGHT HANDLE (MISCELLANEOUS) ×3 IMPLANT
COVER WAND RF STERILE (DRAPES) ×3 IMPLANT
CUFF TOURNIQUET SINGLE 18IN (TOURNIQUET CUFF) ×3 IMPLANT
CUFF TOURNIQUET SINGLE 24IN (TOURNIQUET CUFF) IMPLANT
DRAIN PENROSE 1/4X12 LTX STRL (WOUND CARE) IMPLANT
DRAPE SURG 17X23 STRL (DRAPES) ×3 IMPLANT
DRSG ADAPTIC 3X8 NADH LF (GAUZE/BANDAGES/DRESSINGS) ×3 IMPLANT
ELECT REM PT RETURN 9FT ADLT (ELECTROSURGICAL)
ELECTRODE REM PT RTRN 9FT ADLT (ELECTROSURGICAL) IMPLANT
GAUZE SPONGE 4X4 12PLY STRL (GAUZE/BANDAGES/DRESSINGS) ×3 IMPLANT
GAUZE XEROFORM 1X8 LF (GAUZE/BANDAGES/DRESSINGS) ×3 IMPLANT
GAUZE XEROFORM 5X9 LF (GAUZE/BANDAGES/DRESSINGS) IMPLANT
GLOVE BIOGEL PI IND STRL 8.5 (GLOVE) ×1 IMPLANT
GLOVE BIOGEL PI INDICATOR 8.5 (GLOVE) ×2
GLOVE SURG ORTHO 8.0 STRL STRW (GLOVE) ×3 IMPLANT
GOWN STRL REUS W/ TWL LRG LVL3 (GOWN DISPOSABLE) ×3 IMPLANT
GOWN STRL REUS W/ TWL XL LVL3 (GOWN DISPOSABLE) ×1 IMPLANT
GOWN STRL REUS W/TWL LRG LVL3 (GOWN DISPOSABLE) ×9
GOWN STRL REUS W/TWL XL LVL3 (GOWN DISPOSABLE) ×3
HANDPIECE INTERPULSE COAX TIP (DISPOSABLE)
KIT BASIN OR (CUSTOM PROCEDURE TRAY) ×3 IMPLANT
KIT TURNOVER KIT B (KITS) ×3 IMPLANT
MANIFOLD NEPTUNE II (INSTRUMENTS) ×3 IMPLANT
NDL HYPO 25GX1X1/2 BEV (NEEDLE) IMPLANT
NEEDLE HYPO 25GX1X1/2 BEV (NEEDLE) IMPLANT
NS IRRIG 1000ML POUR BTL (IV SOLUTION) ×3 IMPLANT
PACK ORTHO EXTREMITY (CUSTOM PROCEDURE TRAY) ×3 IMPLANT
PAD ARMBOARD 7.5X6 YLW CONV (MISCELLANEOUS) ×6 IMPLANT
PAD CAST 4YDX4 CTTN HI CHSV (CAST SUPPLIES) ×1 IMPLANT
PADDING CAST COTTON 4X4 STRL (CAST SUPPLIES) ×3
SET CYSTO W/LG BORE CLAMP LF (SET/KITS/TRAYS/PACK) IMPLANT
SET HNDPC FAN SPRY TIP SCT (DISPOSABLE) IMPLANT
SOAP 2 % CHG 4 OZ (WOUND CARE) ×3 IMPLANT
SPONGE LAP 18X18 RF (DISPOSABLE) ×3 IMPLANT
SPONGE LAP 4X18 RFD (DISPOSABLE) ×3 IMPLANT
SUT ETHILON 4 0 PS 2 18 (SUTURE) IMPLANT
SUT ETHILON 5 0 P 3 18 (SUTURE)
SUT NYLON ETHILON 5-0 P-3 1X18 (SUTURE) IMPLANT
SWAB COLLECTION DEVICE MRSA (MISCELLANEOUS) ×3 IMPLANT
SWAB CULTURE ESWAB REG 1ML (MISCELLANEOUS) IMPLANT
SYR CONTROL 10ML LL (SYRINGE) IMPLANT
TOWEL OR 17X24 6PK STRL BLUE (TOWEL DISPOSABLE) ×3 IMPLANT
TOWEL OR 17X26 10 PK STRL BLUE (TOWEL DISPOSABLE) ×3 IMPLANT
TUBE CONNECTING 12'X1/4 (SUCTIONS) ×1
TUBE CONNECTING 12X1/4 (SUCTIONS) ×2 IMPLANT
UNDERPAD 30X30 (UNDERPADS AND DIAPERS) ×3 IMPLANT
WATER STERILE IRR 1000ML POUR (IV SOLUTION) ×3 IMPLANT
YANKAUER SUCT BULB TIP NO VENT (SUCTIONS) ×3 IMPLANT

## 2019-01-22 NOTE — Anesthesia Postprocedure Evaluation (Signed)
Anesthesia Post Note  Patient: [name]  Procedure(s) Performed: IRRIGATION AND DEBRIDEMENT OF LEFT INDEX FINGER, (Left Finger) Amputation Left Index Finger First Joint (Left Finger)     Patient location during evaluation: PACU Anesthesia Type: MAC Level of consciousness: awake and alert Pain management: pain level controlled Vital Signs Assessment: post-procedure vital signs reviewed and stable Respiratory status: spontaneous breathing, nonlabored ventilation, respiratory function stable and patient connected to nasal cannula oxygen Cardiovascular status: stable and blood pressure returned to baseline Postop Assessment: no apparent nausea or vomiting Anesthetic complications: no    Last Vitals:  Vitals:   01/22/19 1922 01/22/19 2004  BP: (!) 145/55 (!) 129/43  Pulse:  (!) 55  Resp: 16 18  Temp: 36.4 C 36.9 C  SpO2:  100%    Last Pain:  Vitals:   01/22/19 2004  TempSrc: Oral  PainSc:                  Keniah Klemmer COKER

## 2019-01-22 NOTE — Progress Notes (Signed)
   01/22/19 2004  Vitals  Temp 98.4 F (36.9 C)  Temp Source Oral  BP (!) 129/43  MAP (mmHg) 67  BP Location Right Arm  BP Method Automatic  Patient Position (if appropriate) Lying  Pulse Rate (!) 55  Pulse Rate Source Monitor  Resp 18  Oxygen Therapy  SpO2 100 %  O2 Device Room Air  MEWS Score  MEWS RR 0  MEWS Pulse 0  MEWS Systolic 0  MEWS LOC 0  MEWS Temp 0  MEWS Score 0  MEWS Score Color Green  Admitted pt to rm 0B49 from ED, pt alert and oriented, denied pain at this time, oriented to room, call bell placed within reach, placed on cardiac monitor, CCMD made aware. Dressing to left index finger clean, dry and intact.

## 2019-01-22 NOTE — Progress Notes (Signed)
Pharmacy Antibiotic Note  Courtney Bradford is a 85 y.o. female admitted on 01/22/2019 with worsening wound infection.  Pharmacy has been consulted for vancomycin and Zosyn dosing. CKD3 > SCr 2.85, CrCl ~ 14.62ml/min (BL ~ 2-2.2)  Plan: Vancomycin 1500mg  IV x1, then 1000mg  IV every 48 hours Zosyn 3.375g IV every 8 hours (4 hour infusion) Monitor renal function, Cx and clinical progression to narrow Vancomycin levels at steady state     Temp (24hrs), Avg:98.3 F (36.8 C), Min:98.3 F (36.8 C), Max:98.3 F (36.8 C)  Recent Labs  Lab 01/18/19 1424 01/22/19 1338  WBC 7.7 8.6  CREATININE 2.26* 2.85*    Estimated Creatinine Clearance: 14.5 mL/min (A) (by C-G formula based on SCr of 2.85 mg/dL (H)).    Allergies  Allergen Reactions  . Lactose Intolerance (Gi) Other (See Comments)    Upset stomach    Antimicrobials this admission: Vanc 3/25>> Zosyn 3/25>>  Dose adjustments this admission: n/a  Microbiology results: 3/25 BCx: sent  Bertis Ruddy, PharmD Clinical Pharmacist Please check AMION for all [place] numbers 01/22/2019 2:41 PM

## 2019-01-22 NOTE — ED Provider Notes (Signed)
[facility] EMERGENCY DEPARTMENT Provider Note   CSN: [id number] Arrival date & time: 01/22/19  1256    History   Chief Complaint Chief Complaint  Patient presents with  . Hand Pain    HPI Courtney Bradford is a 85 y.o. female.     HPI  Patient is an 85 year old female with history of atrial fibrillation on Eliquis, CKD stage III, CHF, type 2 diabetes mellitus presenting for left index finger swelling and pain.  Patient was evaluated by Dr. Aline Brochure in [place] 4 days ago and I&D was performed for gouty arthritis of the left index finger.  She was placed on Keflex, but reports increased swelling and pain.  She was evaluated again by Dr. Aline Brochure today in the office who recommended she come to [facility] for further evaluation and management.  Patient presents with her son who reports that she has had tactile fevers as well as some confusion over the last couple days. She has also had poor appetite.  Last meal today at 11:30 AM.  She did also receive Norco today which did not help her pain very much.  Past Medical History:  Diagnosis Date  . A-fib ([place])   . Anemia   . Arthritis    right knee  . Cataract   . CHF (congestive heart failure) ([place])   . CKD (chronic kidney disease), stage III ([place])   . Dehydration   . Diabetes mellitus   . Gout   . Headache(784.0)   . Hypertension   . UTI (lower urinary tract infection)     Patient Active Problem List   Diagnosis Date Noted  . Acute encephalopathy 02/28/2016  . UTI (lower urinary tract infection)   . Generalized weakness 11/11/2015  . Acute renal failure superimposed on stage 3 chronic kidney disease ([place]) 11/11/2015  . Hyperkalemia 11/11/2015  . A-fib ([place]) 11/11/2015  . Hypokalemia 11/11/2015  . Gout   . UTI (urinary tract infection) 07/23/2014  . Acute on chronic diastolic heart failure ([place]) 07/22/2014  . Atrial fibrillation, chronic ([place]) 07/22/2014  . Diabetes mellitus type 2, controlled ([place])  07/22/2014  . Leg pain, bilateral 07/22/2014  . SUI (stress urinary incontinence, female) 05/16/2011  . Pelvic relaxation 05/16/2011  . ANEMIA 11/03/2009  . Essential hypertension 11/03/2009  . PREMATURE VENTRICULAR CONTRACTIONS 11/03/2009  . DYSPNEA 11/03/2009    Past Surgical History:  Procedure Laterality Date  . ABDOMINAL HYSTERECTOMY    . ANTERIOR AND POSTERIOR REPAIR  05/17/2011   Procedure: ANTERIOR (CYSTOCELE) AND POSTERIOR REPAIR (RECTOCELE);  Surgeon: Eli Hose, MD;  Location: [facility];  Service: Gynecology;  Laterality: N/A;  Anterior repair with TVT bladder sling and cystoscopy  . BLADDER SUSPENSION  05/17/2011   Procedure: TRANSVAGINAL TAPE (TVT) PROCEDURE;  Surgeon: Eli Hose, MD;  Location: [facility];  Service: Gynecology;  Laterality: N/A;  . BREAST SURGERY     breast biopsy     OB History   No obstetric history on file.      Home Medications    Prior to Admission medications   Medication Sig Start Date End Date Taking? Authorizing Provider  cephALEXin (KEFLEX) 250 MG capsule Take 1 capsule (250 mg total) by mouth 3 (three) times daily. 01/18/19  Yes Virgel Manifold, MD  HYDROcodone-acetaminophen (NORCO/VICODIN) 5-325 MG tablet Take 1-2 tablets by mouth every 6 (six) hours as needed. 01/18/19  Yes Virgel Manifold, MD  amLODipine (NORVASC) 5 MG tablet Take 5 mg by mouth daily.  [provider]  apixaban (ELIQUIS) 5 MG TABS tablet Take 1 tablet (5 mg total) by mouth 2 (two) times daily. 07/31/14   Hosie Poisson, MD  cloNIDine (CATAPRES - DOSED IN MG/24 HR) 0.3 mg/24hr patch Place 0.3 mg onto the skin every Friday.     [provider]  colchicine 0.6 MG tablet Take 1 tablet (0.6 mg total) by mouth daily. 03/05/16   Hollice Gong, Mir Mohammed, MD  ferrous fumarate (HEMOCYTE - 106 MG FE) 325 (106 FE) MG TABS tablet Take 1 tablet by mouth every morning.     [provider]  furosemide (LASIX) 20 MG tablet Take 20 mg by mouth daily.     [provider]  insulin regular (HUMULIN R) 100 units/mL injection Inject 10 Units into the skin every morning.      [provider]  metoprolol tartrate (LOPRESSOR) 25 mg/10 mL SUSP Take 2.5 mLs (6.25 mg total) by mouth daily. 03/02/16   Hollice Gong, Mir Mohammed, MD  Olopatadine HCl 0.2 % SOLN Place 1 drop into both eyes daily as needed (watery eyes).     [provider]  predniSONE (DELTASONE) 10 MG tablet Take  40mg  (4 tabs) x 2 days, then taper to 30mg  (3 tabs) x 2 days, then 20mg  (2 tabs) x  2days, then 10mg  (1 tab) x 2 days, then OFF. 11/14/15   Cristal Ford, DO    Family History No family history on file.  Social History Social History   Tobacco Use  . Smoking status: Never Smoker  . Smokeless tobacco: Never Used  Substance Use Topics  . Alcohol use: No  . Drug use: No     Allergies   Lactose intolerance (gi)   Review of Systems Review of Systems  Constitutional: Positive for chills. Negative for fever.  HENT: Negative for congestion and sore throat.   Eyes: Negative for visual disturbance.  Respiratory: Negative for cough, chest tightness and shortness of breath.   Cardiovascular: Negative for chest pain, palpitations and leg swelling.  Gastrointestinal: Negative for abdominal pain, nausea and vomiting.  Genitourinary: Negative for dysuria and flank pain.  Musculoskeletal: Positive for arthralgias. Negative for back pain and myalgias.  Skin: Negative for rash.  Neurological: Negative for dizziness, syncope, weakness and headaches.     Physical Exam Updated Vital Signs BP (!) 154/75 (BP Location: Right Arm)   Pulse 85   Temp 98.3 F (36.8 C) (Oral)   Resp 16   SpO2 98%   Physical Exam Vitals signs and nursing note reviewed.  Constitutional:      General: She is not in acute distress.    Appearance: She is well-developed.  HENT:     Head: Normocephalic and atraumatic.  Eyes:     Conjunctiva/sclera: Conjunctivae normal.      Pupils: Pupils are equal, round, and reactive to light.  Neck:     Musculoskeletal: Normal range of motion and neck supple.  Cardiovascular:     Rate and Rhythm: Normal rate and regular rhythm.     Heart sounds: S1 normal and S2 normal. No murmur.  Pulmonary:     Effort: Pulmonary effort is normal.     Breath sounds: Normal breath sounds. No wheezing or rales.  Abdominal:     General: There is no distension.     Palpations: Abdomen is soft.     Tenderness: There is no abdominal tenderness. There is no guarding.  Musculoskeletal: Normal range of motion.  General: Swelling, tenderness and deformity present.     Comments: See clinical photo for details.  Patient's distal left index finger exhibits swelling as well as dehiscence of I&D site.  Lymphadenopathy:     Cervical: No cervical adenopathy.  Skin:    General: Skin is warm and dry.     Findings: No erythema or rash.  Neurological:     Mental Status: She is alert.     Comments: Cranial nerves grossly intact. Patient moves extremities symmetrically and with good coordination.  Psychiatric:        Behavior: Behavior normal.        Thought Content: Thought content normal.        Judgment: Judgment normal.        ED Treatments / Results  Labs (all labs ordered are listed, but only abnormal results are displayed) Labs Reviewed  CBC WITH DIFFERENTIAL/PLATELET - Abnormal; Notable for the following components:      Result Value   RBC 3.64 (*)    Hemoglobin 10.2 (*)    HCT 33.0 (*)    Abs Immature Granulocytes 0.09 (*)    All other components within normal limits  BASIC METABOLIC PANEL - Abnormal; Notable for the following components:   CO2 19 (*)    Glucose, Bld 176 (*)    BUN 86 (*)    Creatinine, Ser 2.85 (*)    GFR calc non Af Amer 14 (*)    GFR calc Af Amer 17 (*)    All other components within normal limits  GLUCOSE, CAPILLARY - Abnormal; Notable for the following components:   Glucose-Capillary 187 (*)     All other components within normal limits  CULTURE, BLOOD (ROUTINE X 2)  CULTURE, BLOOD (ROUTINE X 2)  LACTIC ACID, PLASMA  LACTIC ACID, PLASMA    EKG None  Radiology Dg Finger Index Left  Result Date: 01/22/2019 CLINICAL DATA:  Diabetes. Necrotic left index finger for several months. EXAM: LEFT INDEX FINGER 2+V COMPARISON:  01/18/2019 FINDINGS: Diffuse soft tissue swelling overlying the distal second ray identified. There are new foci of gas there is a new focus of soft tissue gas identified. Progressive there is complete osteolysis of the distal phalanx with progressive osteolysis distal aspect of the middle phalanx. IMPRESSION: 1. Complete osteolysis of the distal phalanx with progressive osteolysis of the distal aspect of the middle phalanx. 2. New soft tissue gas identified within the soft tissues of the distal ray. Electronically Signed   By: Kerby Moors M.D.   On: 01/22/2019 14:09    Procedures Procedures (including critical care time)  Medications Ordered in ED Medications - No data to display   Initial Impression / Assessment and Plan / ED Course  I have reviewed the triage vital signs and the nursing notes.  Pertinent labs & imaging results that were available during my care of the patient were reviewed by me and considered in my medical decision making (see chart for details).  Clinical Course as of Jan 21 1533  Wed Jan 22, 2019  1454 Worsening AKI. Will provide soft fluid repletion.   Creatinine(!): 2.85 [AM]  1532 Spoke with Dr. Verlon Au who states that he may not be able to specifically admit the patient himself due to patient going to OR currently but will coordinate with Silvestre Gunner postoperatively. Relayed message regarding son's request for 3E. Appreciate his involvement.    [AM]    Clinical Course User Index [AM] Albesa Seen, PA-C  Patient nontoxic-appearing, afebrile here in emergency department, and in no acute distress.  Patient with  progressive swelling and dehiscence of incision and drainage of the left index finger.  Differential diagnosis includes infection versus gouty arthritis.  Given the subjective fevers at home and her confusion per her son, will treat as infection at this time with broad-spectrum antibiotics.  Case discussed with Silvestre Gunner, PA-C who is facilitating operating room incision and drainage today. She is currently NPO. Patient will be admitted.  Patient's son is a Veterinary surgeon and is requesting admission to 3 E. unit.  Will discuss with hospital medicine.  Patient to be taken from emergency department to the operating room.  Final Clinical Impressions(s) / ED Diagnoses   Final diagnoses:  Finger pain, left  AKI (acute kidney injury) [facility])    ED Discharge Orders    None       Tamala Julian 01/22/19 1535    Davonna Belling, MD 01/23/19 1431

## 2019-01-22 NOTE — ED Notes (Signed)
Report called to OR  

## 2019-01-22 NOTE — ED Triage Notes (Signed)
Per pts family- pt had a left index finger infection lanced on Saturday. Pt went for a recheck this morning. Finger was reported to have increased swelling and drainage. Pt was reported to have fevers and disoriented at home. Pt was started on Keflex on Saturday as well as hydrocodone for pain. Both were last taken at 12pm today.

## 2019-01-22 NOTE — Consult Note (Addendum)
Reason for Consult:Right index finger infection Referring Physician: Robyn Haber  Courtney Bradford is an 85 y.o. female.  HPI: Curry has been suffering with gout in her right index DIP joint for about 6 months. It's been swollen and hard but not really bothering her until last Friday. Then she had increased swelling and pain. She had some subjective fevers and was taken to the ED at [facility] on Saturday. She was seen by Dr. Aline Brochure there and had an I&D done. He though this was gouty infiltration but cultures did come back with some rare MSSA. During this week the finger has not improved and the patient has had subjective fevers and mental status changes.  Past Medical History:  Diagnosis Date  . A-fib ([place])   . Anemia   . Arthritis    right knee  . Cataract   . CHF (congestive heart failure) ([place])   . CKD (chronic kidney disease), stage III ([place])   . Dehydration   . Diabetes mellitus   . Gout   . Headache(784.0)   . Hypertension   . UTI (lower urinary tract infection)     Past Surgical History:  Procedure Laterality Date  . ABDOMINAL HYSTERECTOMY    . ANTERIOR AND POSTERIOR REPAIR  05/17/2011   Procedure: ANTERIOR (CYSTOCELE) AND POSTERIOR REPAIR (RECTOCELE);  Surgeon: Eli Hose, MD;  Location: [facility];  Service: Gynecology;  Laterality: N/A;  Anterior repair with TVT bladder sling and cystoscopy  . BLADDER SUSPENSION  05/17/2011   Procedure: TRANSVAGINAL TAPE (TVT) PROCEDURE;  Surgeon: Eli Hose, MD;  Location: [facility];  Service: Gynecology;  Laterality: N/A;  . BREAST SURGERY     breast biopsy    No family history on file.  Social History:  reports that she has never smoked. She has never used smokeless tobacco. She reports that she does not drink alcohol or use drugs.  Allergies:  Allergies  Allergen Reactions  . Lactose Intolerance (Gi) Other (See Comments)    Upset stomach    Medications: I have reviewed the patient's current medications.  Results for orders  placed or performed during the hospital encounter of 01/22/19 (from the past 48 hour(s))  CBC with Differential     Status: Abnormal   Collection Time: 01/22/19  1:38 PM  Result Value Ref Range   WBC 8.6 4.0 - 10.5 K/uL   RBC 3.64 (L) 3.87 - 5.11 MIL/uL   Hemoglobin 10.2 (L) 12.0 - 15.0 g/dL   HCT 33.0 (L) 36.0 - 46.0 %   MCV 90.7 80.0 - 100.0 fL   MCH 28.0 26.0 - 34.0 pg   MCHC 30.9 30.0 - 36.0 g/dL   RDW 13.4 11.5 - 15.5 %   Platelets 294 150 - 400 K/uL   nRBC 0.0 0.0 - 0.2 %   Neutrophils Relative % 80 %   Neutro Abs 7.0 1.7 - 7.7 K/uL   Lymphocytes Relative 11 %   Lymphs Abs 0.9 0.7 - 4.0 K/uL   Monocytes Relative 6 %   Monocytes Absolute 0.5 0.1 - 1.0 K/uL   Eosinophils Relative 1 %   Eosinophils Absolute 0.1 0.0 - 0.5 K/uL   Basophils Relative 1 %   Basophils Absolute 0.0 0.0 - 0.1 K/uL   Immature Granulocytes 1 %   Abs Immature Granulocytes 0.09 (H) 0.00 - 0.07 K/uL    Comment: Performed at [facility], [address], [place], [place] [address]    Dg Finger Index Left  Result  Date: 01/22/2019 CLINICAL DATA:  Diabetes. Necrotic left index finger for several months. EXAM: LEFT INDEX FINGER 2+V COMPARISON:  01/18/2019 FINDINGS: Diffuse soft tissue swelling overlying the distal second ray identified. There are new foci of gas there is a new focus of soft tissue gas identified. Progressive there is complete osteolysis of the distal phalanx with progressive osteolysis distal aspect of the middle phalanx. IMPRESSION: 1. Complete osteolysis of the distal phalanx with progressive osteolysis of the distal aspect of the middle phalanx. 2. New soft tissue gas identified within the soft tissues of the distal ray. Electronically Signed   By: Kerby Moors M.D.   On: 01/22/2019 14:09    Review of Systems  Constitutional: Positive for fever. Negative for chills and weight loss.  HENT: Negative for ear discharge, ear pain, hearing loss and tinnitus.   Eyes: Negative for blurred  vision, double vision, photophobia and pain.  Respiratory: Negative for cough, sputum production and shortness of breath.   Cardiovascular: Negative for chest pain.  Gastrointestinal: Negative for abdominal pain, nausea and vomiting.  Genitourinary: Negative for dysuria, flank pain, frequency and urgency.  Musculoskeletal: Positive for joint pain (Right index finger). Negative for back pain, falls, myalgias and neck pain.  Neurological: Negative for dizziness, tingling, sensory change, focal weakness, loss of consciousness and headaches.  Endo/Heme/Allergies: Does not bruise/bleed easily.  Psychiatric/Behavioral: Negative for depression, memory loss and substance abuse. The patient is not nervous/anxious.    Blood pressure (!) 154/75, pulse 85, temperature 98.3 F (36.8 C), temperature source Oral, resp. rate 16, SpO2 98 %. Physical Exam  Constitutional: She appears well-developed and well-nourished. No distress.  HENT:  Head: Normocephalic and atraumatic.  Eyes: Conjunctivae are normal. Right eye exhibits no discharge. Left eye exhibits no discharge. No scleral icterus.  Neck: Normal range of motion.  Cardiovascular: Normal rate. An irregularly irregular rhythm present.  Respiratory: Effort normal. No respiratory distress.  Musculoskeletal:     Comments: Right shoulder, elbow, wrist, digits- Complex wound dorsum of distal index finger, mod TTP, no instability, no blocks to motion except pain  Sens  Ax/R/M/U intact  Mot   Ax/ R/ PIN/ M/ AIN/ U intact  Rad 2+  Neurological: She is alert.  Skin: Skin is warm and dry. She is not diaphoretic.  Psychiatric: She has a normal mood and affect. Her behavior is normal.    Assessment/Plan: Right index finger gout vs infection -- Will plan on formal I&D in OR by Dr. Caralyn Guile. NPO until then.  Multiple medical problems including Afib, gout, CHF, CKD, DM, and chronic UTI's -- per primary service  The patient was seen and examined.the patient was  seen and examined. The patient does have the open distal interphalangeal joint with a drainingnThe patient does have the open distal interphalangeal joint with the draining fluid.  Will plan for formal I/D in OR local with light sedation Pt seen/examined at bedside with her son  Iran Planas MD    Lisette Abu, PA-C Orthopedic Surgery [phone] 01/22/2019, 2:30 PM

## 2019-01-22 NOTE — Transfer of Care (Signed)
Immediate Anesthesia Transfer of Care Note  Patient: Courtney Bradford  Procedure(s) Performed: IRRIGATION AND DEBRIDEMENT OF LEFT INDEX FINGER, (Left Finger) Amputation Left Index Finger First Joint (Left Finger)  Patient Location: PACU  Anesthesia Type:MAC  Level of Consciousness: awake, alert , oriented and patient cooperative  Airway & Oxygen Therapy: Patient Spontanous Breathing and Patient connected to nasal cannula oxygen  Post-op Assessment: Report given to RN, Post -op Vital signs reviewed and stable and Patient moving all extremities  Post vital signs: Reviewed and stable  Last Vitals:  Vitals Value Taken Time  BP 108/80 01/22/2019  6:52 PM  Temp    Pulse 98 01/22/2019  6:52 PM  Resp 18 01/22/2019  6:52 PM  SpO2 100 % 01/22/2019  6:52 PM  Vitals shown include unvalidated device data.  Last Pain:  Vitals:   01/22/19 1317  TempSrc:   PainSc: 0-No pain         Complications: No apparent anesthesia complications

## 2019-01-22 NOTE — Progress Notes (Addendum)
Emergency room follow-up visit  Chief Complaint  Patient presents with  . Routine Post Op    I and D right index finger   Date of ER visit was January 18, 2019  Patient had gouty destruction of her distal interphalangeal joint RIGHT index finger  Presents today for dressing change  Finger does not look viable.  Images are in the media section of the chart and the son who is a Equities trader has a picture on his cell phone of the digit.  Recommend consult with hand surgery.   Past Medical History:  Diagnosis Date  . A-fib ([place])   . Anemia   . Arthritis    right knee  . Cataract   . CHF (congestive heart failure) ([place])   . CKD (chronic kidney disease), stage III ([place])   . Dehydration   . Diabetes mellitus   . Gout   . Headache(784.0)   . Hypertension   . UTI (lower urinary tract infection)    It was extremely difficult to get the dressing off today.  For further dressing changes recommend digital block.  Encounter Diagnosis  Name Primary?  . 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

## 2019-01-22 NOTE — Op Note (Signed)
PREOPERATIVE DIAGNOSIS: Left index finger gouty arthropathy with destructive lesion of the DIP joint and profound tophus around the DIP joint  POSTOPERATIVE DIAGNOSIS: Same  ATTENDING SURGEON: Dr. Iran Planas who scrubbed and present for the entire procedure  ASSISTANT SURGEON: None  ANESTHESIA: 1% Xylocaine 4% Marcaine local block with IV sedation  OPERATIVE PROCEDURE: #1: Left index finger debridement of skin subcutaneous tissue and bone associated with inflammatory and destructive arthropathy #2: Left index finger amputation through the level of the middle phalanx with local neurectomies and primary closure  IMPLANTS: None  RADIOGRAPHIC INTERPRETATION: None  SURGICAL INDICATIONS: Patient is an 85 year old female who had been previously treated by Dr. Arther Abbott.  Dr. Aline Brochure open up the patient's DIP joint and was concerned about the patient's inability to improve and she was referred to the [facility] emergency department for hand consultation.  After evaluation is recommended that the patient undergo formal irrigation and debridement and possible amputation to the level of the DIP joint.  Risk benefits and alternatives were discussed with the patient son who is at bedside and consulted intraoperatively throughout the entire procedure.  SURGICAL TECHNIQUE: Patient was palpated via the preoperative holding area to mark the permanent marker made on the left index finger indicate the correct operative site.  Patient brought back to operating placed supine on anesthesia table where the IV sedation was administered.  The local anesthetic was administered.  Well-padded tourniquet placed on the left brachium stay with the appropriate drape.  The left upper extremities then prepped and draped normal sterile fashion.  A timeout was called the correct site was identified the procedure then begun.  Excisional debridement of the skin subcutaneous tissue and bone was done of the DIP joint.  The  patient did not have a DIP joint present.  This was a highly unstable and since rather significant open wound.  After thorough debridement the amputation was completed through the middle phalanx.  This allowed for mobilization of the skin flap from a volar to dorsal direction in order to close the large wound.  Intraoperative consultation was done with the son explaining him the procedure and the reason for amputation.  Local neurectomies were carried out.  Skin flaps were then closed with simple Prolene sutures.  Adaptic dressing and a sterile compressive bandage then applied.  The tourniquet was deflated patient was then taken recovery room in good condition.  POSTOPERATIVE PLAN: Patient is going to be admitted to the internal medicine service.  Continue with the current dressing for the next week.  A plan to look at the wound in 1 week.  The son has my contact information should you have any questions he knows how to contact me directly.

## 2019-01-22 NOTE — Anesthesia Preprocedure Evaluation (Signed)
Anesthesia Evaluation  Patient identified by MRN, date of birth, ID band Patient awake    Reviewed: Allergy & Precautions, NPO status , Patient's Chart, lab work & pertinent test results  History of Anesthesia Complications Negative for: history of anesthetic complications  Airway Mallampati: II  TM Distance: >3 FB Neck ROM: Full    Dental  (+) Upper Dentures, Lower Dentures   Pulmonary neg pulmonary ROS,    breath sounds clear to auscultation       Cardiovascular hypertension, Pt. on medications (-) angina+CHF  (-) Past MI + dysrhythmias Atrial Fibrillation  Rhythm:Irregular     Neuro/Psych  Headaches,  Neuromuscular disease negative psych ROS   GI/Hepatic negative GI ROS, Neg liver ROS,   Endo/Other  negative endocrine ROSdiabetes, Type 2, Insulin Dependent  Renal/GU CRFRenal disease     Musculoskeletal  (+) Arthritis , Right index finger infection   Abdominal   Peds negative pediatric ROS (+)  Hematology  (+) Blood dyscrasia, anemia ,   Anesthesia Other Findings tte 2017: Left ventricle: The cavity size was normal. Systolic function was   normal. The estimated ejection fraction was in the range of 60%   to 65%. Wall motion was normal; there were no regional wall   motion abnormalities. - Aortic valve: Transvalvular velocity was within the normal range.   There was no stenosis. There was moderate regurgitation.   Regurgitation pressure half-time: 382 ms. - Mitral valve: There was mild regurgitation. - Left atrium: The atrium was severely dilated. - Right ventricle: The cavity size was normal. Wall thickness was   normal. Systolic function was normal. - Atrial septum: The septum bowed from left to right, consistent   with increased left atrial pressure. No defect or patent foramen   ovale was identified by color flow Doppler. - Tricuspid valve: There was mild regurgitation. - Pulmonary arteries: PA peak  pressure: 35 mm Hg (S). - Inferior vena cava: The vessel was normal in size. The   respirophasic diameter changes were in the normal range (>= 50%),   consistent with normal central venous pressure.  Reproductive/Obstetrics                             Anesthesia Physical Anesthesia Plan  ASA: III  Anesthesia Plan: MAC   Post-op Pain Management:    Induction: Intravenous  PONV Risk Score and Plan: 2 and Treatment may vary due to age or medical condition  Airway Management Planned: Nasal Cannula  Additional Equipment: None  Intra-op Plan:   Post-operative Plan:   Informed Consent: I have reviewed the patients History and Physical, chart, labs and discussed the procedure including the risks, benefits and alternatives for the proposed anesthesia with the patient or authorized representative who has indicated his/her understanding and acceptance.     Dental advisory given and Consent reviewed with POA  Plan Discussed with: CRNA and Surgeon  Anesthesia Plan Comments:         Anesthesia Quick Evaluation

## 2019-01-22 NOTE — Progress Notes (Signed)
CRITICAL VALUE ALERT  Critical Value:  Lactic acid 2.8  Date & Time Notied:  01/22/2019 4:31 PM  Provider Notified: Dr. Verlon Au  Orders Received/Actions taken: new orders received and carried out

## 2019-01-22 NOTE — H&P (Addendum)
HPI  Courtney Bradford:[id number] DOB: 1931-09-07 DOA: 01/22/2019  PCP: Iona Beard, MD   Chief Complaint: Pain in finger  HPI:  85 year old African-American female Known gout dependent on daily colchicine (otherwise unable to walk) Diastolic heart failure EF 60-65% 02/29/2016 Hypertension Atrial fibrillation chads score >4 on Eliquis Diabetes mellitus type 2 on insulin with nephropathy CKD stage III  Patient seen by Dr. Aline Brochure at [facility] [address] for evaluation of left index finger-has been draining and had increased pain on 3/20--patient underwent partial excision under digital block and tophus was removed and was sent home on Keflex -Cultures from the incision grew Staphylococcus aureus which was resistant to erythromycin  Patient is quiet and allows her son who is a Veterinary surgeon here to give input Patient experienced fever over the past couple of days decreased appetite but T-max was not measured at home--patient was taking Keflex 250 3 times daily She was warm to touch however and was given Tylenol daily For the past week she has been sitting in a chair refusing to eat or drink At baseline she lives alone and has CNA's a come and go throughout the day-last week she was given a clean bill of health and seem to require less assistance so her home health was decreased  Because of worsening appearance of the wound her son decided to bring the patient over to [facility] was seen in the emergency room after Ortho was consulted and taken immediately upstairs to perioperative room  ED Course:  Given a dose of Zosyn and pain medication and sent to the OR where vancomycin was hung  Review of Systems:  Pertinent negatives are dark stool tarry stool hematemesis unilateral vision loss unilateral weakness fall vomiting dysuria at this time   Past Medical History:  Diagnosis Date  . A-fib ([place])   . Anemia   . Arthritis    right knee  . Cataract   . CHF  (congestive heart failure) ([place])   . CKD (chronic kidney disease), stage III ([place])   . Dehydration   . Diabetes mellitus   . Gout   . Headache(784.0)   . Hypertension   . UTI (lower urinary tract infection)     Past Surgical History:  Procedure Laterality Date  . ABDOMINAL HYSTERECTOMY    . ANTERIOR AND POSTERIOR REPAIR  05/17/2011   Procedure: ANTERIOR (CYSTOCELE) AND POSTERIOR REPAIR (RECTOCELE);  Surgeon: Eli Hose, MD;  Location: [facility];  Service: Gynecology;  Laterality: N/A;  Anterior repair with TVT bladder sling and cystoscopy  . BLADDER SUSPENSION  05/17/2011   Procedure: TRANSVAGINAL TAPE (TVT) PROCEDURE;  Surgeon: Eli Hose, MD;  Location: [facility];  Service: Gynecology;  Laterality: N/A;  . BREAST SURGERY     breast biopsy     reports that she has never smoked. She has never used smokeless tobacco. She reports that she does not drink alcohol or use drugs. Mobility: Patient ambulates with the assistance of a walker at baseline lives alone has a CNA and company at home throughout the day  Allergies  Allergen Reactions  . Lactose Intolerance (Gi) Other (See Comments)    Upset stomach    History reviewed. No pertinent family history. Reviewed and nothing further to add  Prior to Admission medications   Medication Sig Start Date End Date Taking? Authorizing Provider  cephALEXin (KEFLEX) 250 MG capsule Take 1 capsule (250 mg total) by mouth 3 (three) times daily. 01/18/19  Yes Virgel Manifold, MD  HYDROcodone-acetaminophen (NORCO/VICODIN) 5-325 MG tablet Take 1-2 tablets by mouth every 6 (six) hours as needed. 01/18/19  Yes Virgel Manifold, MD  metoprolol tartrate (LOPRESSOR) 25 mg/10 mL SUSP Take 2.5 mLs (6.25 mg total) by mouth daily. 03/02/16  Yes Hollice Gong, Mir Mohammed, MD  amLODipine (NORVASC) 5 MG tablet Take 5 mg by mouth daily.      [provider]  apixaban (ELIQUIS) 5 MG TABS tablet Take 1 tablet (5 mg total) by mouth 2 (two) times daily.  07/31/14   Hosie Poisson, MD  cloNIDine (CATAPRES - DOSED IN MG/24 HR) 0.3 mg/24hr patch Place 0.3 mg onto the skin every Friday.     [provider]  colchicine 0.6 MG tablet Take 1 tablet (0.6 mg total) by mouth daily. 03/05/16   Hollice Gong, Mir Mohammed, MD  ferrous fumarate (HEMOCYTE - 106 MG FE) 325 (106 FE) MG TABS tablet Take 1 tablet by mouth every morning.     [provider]  furosemide (LASIX) 20 MG tablet Take 20 mg by mouth daily.    [provider]  insulin regular (HUMULIN R) 100 units/mL injection Inject 10 Units into the skin every morning.      [provider]  Olopatadine HCl 0.2 % SOLN Place 1 drop into both eyes daily as needed (watery eyes).     [provider]  predniSONE (DELTASONE) 10 MG tablet Take  40mg  (4 tabs) x 2 days, then taper to 30mg  (3 tabs) x 2 days, then 20mg  (2 tabs) x  2days, then 10mg  (1 tab) x 2 days, then OFF. 11/14/15   Cristal Ford, DO    Physical Exam:  Vitals:   01/22/19 1316  BP: (!) 154/75  Pulse: 85  Resp: 16  Temp: 98.3 F (36.8 C)  SpO2: 98%     Awake alert coherent African-American female scleral icterus is absent no pallor, arcus senilis pupils equally reactive to light  GCS 15, Mallampati 2, edentulous without dentures at this time  S1-S2 A. fib rate controlled no murmur rub or gallop  Abdomen soft slightly tender upper quadrant  Grade 1 lower extremity pitting edema with mild JVD  Neurologically intact moving all 4 limbs equally except left upper extremity which has second distal interphalangeal joint swelling and not tested  Lower ext wswollen  GU deferred   I have personally reviewed following labs and imaging studies  Labs:  Potassium 5.0 BUN/creatinine 86/2.8 Hemoglobin 10.2, WBC 8.6 Platelet 294 CO2 19    Imaging studies:  IMPRESSION: 1. Complete osteolysis of the distal phalanx with progressive osteolysis of the distal aspect of the middle phalanx. 2. New soft  tissue gas identified within the soft tissues of the  distal ray.   Medical tests:   EKG independently reviewed: none performed--in NSR on monitor     Principal Problem:   Open wound of finger, infected Patient seen in short stay in about low to surgery-I have discussed with Mr. Gorden Harms to please consider timing of heparin/Eliquis based on postop instructions per Dr. Apolonio Schneiders and also diet- I suspect she will need OT/PT-her son does not wish that she go to a skilled facility but we will get social worker involved postoperatively Obtain cultures perioperatively please--- will continue vancomycin and Zosyn and probably narrowed to vancomycin and ceftriaxone a.m. while awaiting cultures Prior cultures grew relatively pansensitive staph but I am not sure if there is a superinfection which is why I would not narrow at this time As her physiology is not consistent  with severe sepsis I will discontinue lactic acid blood draws given she also has AKI, as we know she has an infection localized to her finger we will manage empirically as above Active Problems:   DYSPNEA   Acute on chronic diastolic heart failure ([place]) Patient paradoxically has worsening creatinine but has a JVD on exam and mild lower extremity pitting edema Check morning albumin and would probably hold amlodipine which can cause lower extremity edema Obtain a.m. BNP I am holding her Lasix as well as her combination thiazide-ARB medication for now and this will need to be considered in the morning See below regarding kidney disease Will need to be reviewed in the morning in terms of fluid status and strict I's and O's and weight   Atrial fibrillation, chronic (HCC), chads score >4 on chronic Eliquis On monitors is rate controlled we will check a morning magnesium Okay to continue at this time metoprolol 12.5 daily   Diabetes mellitus type 2, controlled ([place]) We will place on sliding scale coverage insulin 4 times daily AC at bedtime  postop   Acute renal failure superimposed on stage 3 chronic kidney disease (HCC) Lactic acidosis AKI Combination of sepsis in addition to AKI Gentle IV fluid 50 cc/H Labs am--expect Co2 and K will improve with IVf --if not may need smaller dose lasix consider d/c forever combo HTN meds--use only smaller doses constituent meds   Gout Please resume colchicine postoperatively   Severity of Illness: The appropriate patient status for this patient is INPATIENT. Inpatient status is judged to be reasonable and necessary in order to provide the required intensity of service to ensure the patient's safety. The patient's presenting symptoms, physical exam findings, and initial radiographic and laboratory data in the context of their chronic comorbidities is felt to place them at high risk for further clinical deterioration. Furthermore, it is not anticipated that the patient will be medically stable for discharge from the hospital within 2 midnights of admission. The following factors support the patient status of inpatient.   " The patient's presenting symptoms include infection and swelling of right second DIP with sepsis physiology. " The worrisome physical exam findings include swelling oozing from. " The initial radiographic and laboratory data are worrisome because of concerns for sepsis hyperkalemia. " The chronic co-morbidities include A. fib diabetes AKI.   * I certify that at the point of admission it is my clinical judgment that the patient will require inpatient hospital care spanning beyond 2 midnights from the point of admission due to high intensity of service, high risk for further deterioration and high frequency of surveillance required.*     DVT prophylaxis:heparin for now then eliquis Code Status: FULL Family Communication: Discussed with Mr Khadeejah Castner Consults called: Ortho saw patient    Time spent: 21 minutes  Eriyanna Kofoed, MD  [facility] Direct contact:  [phone] --[place] [place]  --[email or web address]; password [email or web address]  7PM-7AM contact night coverage as above  01/22/2019, 3:34 PM

## 2019-01-23 ENCOUNTER — Encounter (HOSPITAL_COMMUNITY): Payer: Self-pay | Admitting: Orthopedic Surgery

## 2019-01-23 ENCOUNTER — Inpatient Hospital Stay (HOSPITAL_COMMUNITY): Payer: Medicare Other

## 2019-01-23 ENCOUNTER — Telehealth: Payer: Self-pay | Admitting: *Deleted

## 2019-01-23 DIAGNOSIS — M1A0411 Idiopathic chronic gout, right hand, with tophus (tophi): Secondary | ICD-10-CM

## 2019-01-23 DIAGNOSIS — I5033 Acute on chronic diastolic (congestive) heart failure: Secondary | ICD-10-CM

## 2019-01-23 DIAGNOSIS — E118 Type 2 diabetes mellitus with unspecified complications: Secondary | ICD-10-CM

## 2019-01-23 DIAGNOSIS — I340 Nonrheumatic mitral (valve) insufficiency: Secondary | ICD-10-CM

## 2019-01-23 DIAGNOSIS — E872 Acidosis: Secondary | ICD-10-CM

## 2019-01-23 DIAGNOSIS — N179 Acute kidney failure, unspecified: Secondary | ICD-10-CM

## 2019-01-23 DIAGNOSIS — I351 Nonrheumatic aortic (valve) insufficiency: Secondary | ICD-10-CM

## 2019-01-23 DIAGNOSIS — I482 Chronic atrial fibrillation, unspecified: Secondary | ICD-10-CM

## 2019-01-23 DIAGNOSIS — I361 Nonrheumatic tricuspid (valve) insufficiency: Secondary | ICD-10-CM

## 2019-01-23 DIAGNOSIS — R0602 Shortness of breath: Secondary | ICD-10-CM

## 2019-01-23 DIAGNOSIS — N183 Chronic kidney disease, stage 3 (moderate): Secondary | ICD-10-CM

## 2019-01-23 DIAGNOSIS — R001 Bradycardia, unspecified: Secondary | ICD-10-CM

## 2019-01-23 LAB — COMPREHENSIVE METABOLIC PANEL
ALT: 8 U/L (ref 0–44)
AST: 20 U/L (ref 15–41)
Albumin: 2.7 g/dL — ABNORMAL LOW (ref 3.5–5.0)
Alkaline Phosphatase: 47 U/L (ref 38–126)
Anion gap: 10 (ref 5–15)
BUN: 76 mg/dL — ABNORMAL HIGH (ref 8–23)
CALCIUM: 8.8 mg/dL — AB (ref 8.9–10.3)
CO2: 20 mmol/L — ABNORMAL LOW (ref 22–32)
CREATININE: 2.66 mg/dL — AB (ref 0.44–1.00)
Chloride: 110 mmol/L (ref 98–111)
GFR calc Af Amer: 18 mL/min — ABNORMAL LOW (ref >60)
GFR calc non Af Amer: 16 mL/min — ABNORMAL LOW (ref >60)
Glucose, Bld: 80 mg/dL (ref 70–99)
Potassium: 4.6 mmol/L (ref 3.5–5.1)
Sodium: 140 mmol/L (ref 135–145)
Total Bilirubin: 1.3 mg/dL — ABNORMAL HIGH (ref 0.3–1.2)
Total Protein: 6 g/dL — ABNORMAL LOW (ref 6.5–8.1)

## 2019-01-23 LAB — CBC
HCT: 24.9 % — ABNORMAL LOW (ref 36.0–46.0)
Hemoglobin: 8 g/dL — ABNORMAL LOW (ref 12.0–15.0)
MCH: 28.7 pg (ref 26.0–34.0)
MCHC: 32.1 g/dL (ref 30.0–36.0)
MCV: 89.2 fL (ref 80.0–100.0)
PLATELETS: 246 10*3/uL (ref 150–400)
RBC: 2.79 MIL/uL — ABNORMAL LOW (ref 3.87–5.11)
RDW: 13.5 % (ref 11.5–15.5)
WBC: 5.4 10*3/uL (ref 4.0–10.5)
nRBC: 0 % (ref 0.0–0.2)

## 2019-01-23 LAB — GLUCOSE, CAPILLARY
Glucose-Capillary: 76 mg/dL (ref 70–99)
Glucose-Capillary: 76 mg/dL (ref 70–99)
Glucose-Capillary: 77 mg/dL (ref 70–99)
Glucose-Capillary: 126 mg/dL — ABNORMAL HIGH (ref 70–99)

## 2019-01-23 LAB — ECHOCARDIOGRAM COMPLETE
Height: 65 in
Weight: 2465.62 oz

## 2019-01-23 LAB — MAGNESIUM: Magnesium: 1.9 mg/dL (ref 1.7–2.4)

## 2019-01-23 MED ORDER — SODIUM CHLORIDE 0.9 % IV SOLN
INTRAVENOUS | Status: AC
  Administered 2019-01-23: 11:00:00 via INTRAVENOUS

## 2019-01-23 MED ORDER — HEPARIN (PORCINE) 25000 UT/250ML-% IV SOLN
750.0000 [IU]/h | INTRAVENOUS | Status: DC
  Administered 2019-01-23: 950 [IU]/h via INTRAVENOUS
  Filled 2019-01-23: qty 250

## 2019-01-23 MED ORDER — CEPHALEXIN 250 MG PO CAPS
250.0000 mg | ORAL_CAPSULE | Freq: Three times a day (TID) | ORAL | Status: DC
  Administered 2019-01-23 – 2019-01-25 (×7): 250 mg via ORAL
  Filled 2019-01-23 (×7): qty 1

## 2019-01-23 NOTE — Consult Note (Signed)
CARDIOLOGY CONSULT NOTE       Patient ID: Courtney Bradford MRN: [id number] DOB/AGE: [birth year] 85 y.o.  Admit date: 01/22/2019 Referring Physician: Alfredia Ferguson Primary Physician: Iona Beard, MD Primary Cardiologist: Jordan/Taylor Reason for Consultation: Bradycardia  Principal Problem:   Open wound of finger, infected Active Problems:   DYSPNEA   Acute on chronic diastolic heart failure (HCC)   Atrial fibrillation, chronic ([place])   Diabetes mellitus type 2, controlled ([place])   Acute renal failure superimposed on stage 3 chronic kidney disease ([place])   Gout   Lower urinary tract infectious disease   Metabolic acidosis   Sepsis ([place])   HPI:  85 y.o. admitted with infected left index finger post amputation through level of middle phalanx  She has a  Long standing history of SSS and PAF.  Seen by Dr Caryl Comes in 2012 with 2:1 block Asymptomatic and followed. She Was placed on low dose beta blocker for her faster flutter rates and has been on this chronically. She is on eliquis For anticoagulation which was held for surgery. She lives alone and ambulates with walker/cane. She does not fall And has not had syncope chest pain or palpitations. She is afebrile with no signs of systemic infection and on cephelexin q 8 hours. No history of CAD last echo in 2017 with EF 60-65% and moderate AR mild mR LA severely dilated but recorded As 3.9 cm Has had azotemia with CRF and anemia CR 2.66 now and Hct 24.9 down from 33   ROS All other systems reviewed and negative except as noted above  Past Medical History:  Diagnosis Date  . A-fib ([place])   . Anemia   . Arthritis    right knee  . Cataract   . CHF (congestive heart failure) ([place])   . CKD (chronic kidney disease), stage III ([place])   . Dehydration   . Diabetes mellitus   . Gout   . Headache(784.0)   . Hypertension   . UTI (lower urinary tract infection)     History reviewed. No pertinent family history.  Social History   Socioeconomic  History  . Marital status: Single    Spouse name: Not on file  . Number of children: Not on file  . Years of education: Not on file  . Highest education level: Not on file  Occupational History  . Not on file  Social Needs  . Financial resource strain: Not on file  . Food insecurity:    Worry: Not on file    Inability: Not on file  . Transportation needs:    Medical: Not on file    Non-medical: Not on file  Tobacco Use  . Smoking status: Never Smoker  . Smokeless tobacco: Never Used  Substance and Sexual Activity  . Alcohol use: No  . Drug use: No  . Sexual activity: Never    Birth control/protection: Abstinence  Lifestyle  . Physical activity:    Days per week: Not on file    Minutes per session: Not on file  . Stress: Not on file  Relationships  . Social connections:    Talks on phone: Not on file    Gets together: Not on file    Attends religious service: Not on file    Active member of club or organization: Not on file    Attends meetings of clubs or organizations: Not on file    Relationship status: Not on file  . Intimate partner violence:    Fear of current  or ex partner: Not on file    Emotionally abused: Not on file    Physically abused: Not on file    Forced sexual activity: Not on file  Other Topics Concern  . Not on file  Social History Narrative  . Not on file    Past Surgical History:  Procedure Laterality Date  . ABDOMINAL HYSTERECTOMY    . AMPUTATION FINGER Left 01/22/2019   Procedure: Amputation Left Index Finger First Joint;  Surgeon: Iran Planas, MD;  Location: [place] [facility];  Service: Orthopedics;  Laterality: Left;  . ANTERIOR AND POSTERIOR REPAIR  05/17/2011   Procedure: ANTERIOR (CYSTOCELE) AND POSTERIOR REPAIR (RECTOCELE);  Surgeon: Eli Hose, MD;  Location: [facility];  Service: Gynecology;  Laterality: N/A;  Anterior repair with TVT bladder sling and cystoscopy  . BLADDER SUSPENSION  05/17/2011   Procedure: TRANSVAGINAL TAPE (TVT) PROCEDURE;   Surgeon: Eli Hose, MD;  Location: [facility];  Service: Gynecology;  Laterality: N/A;  . BREAST SURGERY     breast biopsy  . I&D EXTREMITY Left 01/22/2019   Procedure: IRRIGATION AND DEBRIDEMENT OF LEFT INDEX FINGER,;  Surgeon: Iran Planas, MD;  Location: [place];  Service: Orthopedics;  Laterality: Left;     . cephALEXin  250 mg Oral Q8H  . colchicine  0.6 mg Oral Daily  . Ferrous Fumarate  1 tablet Oral q morning - 10a  . olopatadine  1 drop Both Eyes BID   . sodium chloride 50 mL/hr at 01/23/19 1040    Physical Exam: Blood pressure (!) 141/65, pulse 71, temperature 98.9 F (37.2 C), temperature source Oral, resp. rate 18, height 5\' 5"  (1.651 m), weight 69.9 kg, SpO2 99 %.   Affect appropriate Elderly black female  HEENT: normal Neck supple with no adenopathy JVP normal no bruits no thyromegaly Lungs clear with no wheezing and good diaphragmatic motion Heart:  S1/S2 SEM AR  murmur, no rub, gallop or click PMI normal Abdomen: benighn, BS positve, no tenderness, no AAA no bruit.  No HSM or HJR Distal pulses intact with no bruits Plus one bilateral  edema Neuro non-focal Post amputation of left index finger wrapped  No muscular weakness   Labs:   Lab Results  Component Value Date   WBC 5.4 01/23/2019   HGB 8.0 (L) 01/23/2019   HCT 24.9 (L) 01/23/2019   MCV 89.2 01/23/2019   PLT 246 01/23/2019    Recent Labs  Lab 01/23/19 0324  NA 140  K 4.6  CL 110  CO2 20*  BUN 76*  CREATININE 2.66*  CALCIUM 8.8*  PROT 6.0*  BILITOT 1.3*  ALKPHOS 47  ALT 8  AST 20  GLUCOSE 80   Lab Results  Component Value Date   CKTOTAL 72 07/21/2014   TROPONINI 0.03 02/29/2016   No results found for: CHOL No results found for: HDL No results found for: LDLCALC No results found for: TRIG No results found for: CHOLHDL No results found for: LDLDIRECT    Radiology: Dg Finger Index Left  Result Date: 01/22/2019 CLINICAL DATA:  Diabetes. Necrotic left index finger for  several months. EXAM: LEFT INDEX FINGER 2+V COMPARISON:  01/18/2019 FINDINGS: Diffuse soft tissue swelling overlying the distal second ray identified. There are new foci of gas there is a new focus of soft tissue gas identified. Progressive there is complete osteolysis of the distal phalanx with progressive osteolysis distal aspect of the middle phalanx. IMPRESSION: 1. Complete osteolysis of the distal phalanx with progressive osteolysis of the  distal aspect of the middle phalanx. 2. New soft tissue gas identified within the soft tissues of the distal ray. Electronically Signed   By: Kerby Moors M.D.   On: 01/22/2019 14:09   Dg Finger Index Left  Result Date: 01/18/2019 CLINICAL DATA:  Chronic left index finger infection which began draining yesterday. EXAM: LEFT INDEX FINGER 2+V COMPARISON:  None. FINDINGS: Soft tissues of the index finger are swollen. The distal phalanx is not visualized. There is incomplete osteolysis of the middle phalanx extending from the proximal diaphysis distally. No radiopaque foreign body or soft tissue gas. Chondrocalcinosis about the imaged MCP joints noted. IMPRESSION: Marked soft tissue swelling of the index finger with complete osteolysis of the distal phalanx and partial osteolysis of the middle phalanx consistent with osteomyelitis. Electronically Signed   By: Inge Rise M.D.   On: 01/18/2019 13:34    EKG: SR 2:1 block and wenkebach previous flutter rates 70's Telemetry:  SR frequent periods HR 40's with wendkebach and 2:1 block HR 35    ASSESSMENT AND PLAN:   SSS:  Chronic dating back to 2012 no previous pacer due to asymptomatic status. Stop lopressor had dose this am Continue telemetry will ask EP to see tomorrow Not ideal to put PPM in now due to recent infection although clean Amputation and BC;s negative Hold eliquis for PAF and start heparin in case needs pacer   CRF:  Hydrate check echo for EF   Anemia:  ? Etiology per primary service follow  closely on heparin and anticoagulation cologard test  AVD:  See above echo murmur on exam history of moderate AR   Ortho: post amputation of left index finger on cephalexin   Signed: Jenkins Rouge 01/23/2019, 11:37 AM

## 2019-01-23 NOTE — Progress Notes (Signed)
Around 1030 this am patient HR down in 30-40, patient alert denies pain, MD called 12 lead EKG done per order, cardiology came to see the patient will start heparin per MD and instruct to monitor the patient.

## 2019-01-23 NOTE — Progress Notes (Signed)
ANTICOAGULATION CONSULT NOTE - Initial Consult  Pharmacy Consult for switch Eliquis to heparin Indication: atrial fibrillation  Allergies  Allergen Reactions  . Lactose Intolerance (Gi) Other (See Comments)    Upset stomach    Patient Measurements: Height: 5\' 5"  (165.1 cm) Weight: 154 lb 1.6 oz (69.9 kg) IBW/kg (Calculated) : 57 Heparin Dosing Weight: 69.9 kg  Vital Signs: Temp: 98.7 F (37.1 C) (03/26 1140) Temp Source: Oral (03/26 1140) BP: 149/63 (03/26 1140) Pulse Rate: 61 (03/26 1140)  Labs: Recent Labs    01/22/19 1338 01/23/19 0324  HGB 10.2* 8.0*  HCT 33.0* 24.9*  PLT 294 246  CREATININE 2.85* 2.66*    Estimated Creatinine Clearance: 15.2 mL/min (A) (by C-G formula based on SCr of 2.66 mg/dL (H)).   Medical History: Past Medical History:  Diagnosis Date  . A-fib ([place])   . Anemia   . Arthritis    right knee  . Cataract   . CHF (congestive heart failure) ([place])   . CKD (chronic kidney disease), stage III ([place])   . Dehydration   . Diabetes mellitus   . Gout   . Headache(784.0)   . Hypertension   . UTI (lower urinary tract infection)     Medications:  Scheduled:  . cephALEXin  250 mg Oral Q8H  . colchicine  0.6 mg Oral Daily  . Ferrous Fumarate  1 tablet Oral q morning - 10a  . olopatadine  1 drop Both Eyes BID    Assessment: 85 yo female on chronic Eliquis for afib.  Pharmacy asked to transition to IV heparin in case pacer is needed this admission.  Had dose of Eliquis this AM at 0934.  Expect heparin levels will be elevated due to recent Eliquis.  Goal of Therapy:  Heparin level 0.3-0.7 units/ml Monitor platelets by anticoagulation protocol: Yes   Plan:  Will start heparin tonight at 2130 PM Start at heparin 950 units/hr. Check aPTT 8 hrs after heparin starts. Daily heparin level, aPTT and CBC. F/u plans for pacer.  Marguerite Olea, [facility] Clinical Pharmacist Phone [phone]  01/23/2019 12:51 PM

## 2019-01-23 NOTE — TOC Initial Note (Signed)
Transition of Care [facility]) - Initial/Assessment Note    Patient Details  Name: Courtney Bradford MRN: [id number] Date of Birth: [birth year]  Transition of Care [facility]) CM/SW Contact:    Sherrilyn Rist  Phone Number: [phone] 01/23/2019, 9:06 AM  Clinical Narrative:                 Patient is known to CM from previous admission; CM talked to her son Ron- caregiver; pt confused at this time; PCP: Iona Beard, MD; private insurance with [facility] with prescription drug coverage; DME - walker, cane, wheelchair at home; [name] is requesting [place] services with [facility] which works with [place] for a nurses aide 3 hrs a day- unable to reach them at this time; Ron will call the Manager at [facility] and have him contact the CM to resume services.  Expected Discharge Plan: [place]     Patient Goals and CMS Choice Patient states their goals for this hospitalization and ongoing recovery are:: to get stronger CMS Medicare.gov Compare Post Acute Care list provided to:: Patient Represenative (must comment)(pt son/ Ron) Choice offered to / list presented to : Adult Children  Expected Discharge Plan and Services Expected Discharge Plan: [place] In-house Referral: NA Discharge Planning Services: CM Consult   Living arrangements for the past 2 months: Single Family Home                     HH Arranged: RN, Nurse's Aide HH Agency: [place]  Prior Living Arrangements/Services Living arrangements for the past 2 months: Single Family Home Lives with:: Adult Children Patient language and need for interpreter reviewed:: No Do you feel safe going back to the place where you live?: Yes      Need for Family Participation in Patient Care: No (Comment) Care giver support system in place?: Yes (comment)   Criminal Activity/Legal Involvement Pertinent to Current Situation/Hospitalization: No - Comment as  needed  Activities of Daily Living Home Assistive Devices/Equipment: CBG Meter, Cane (specify quad or straight), Walker (specify type) ADL Screening (condition at time of admission) Patient's cognitive ability adequate to safely complete daily activities?: Yes Is the patient deaf or have difficulty hearing?: No Does the patient have difficulty seeing, even when wearing glasses/contacts?: No Does the patient have difficulty concentrating, remembering, or making decisions?: No Patient able to express need for assistance with ADLs?: Yes Does the patient have difficulty dressing or bathing?: Yes Independently performs ADLs?: Yes (appropriate for developmental age) Does the patient have difficulty walking or climbing stairs?: Yes Weakness of Legs: Both Weakness of Arms/Hands: None  Permission Sought/Granted Permission sought to share information with : Case Manager Permission granted to share information with : Yes, Verbal Permission Granted  Share Information with NAME: Ron / son  Permission granted to share info w AGENCY: [place] agency        Emotional Assessment Appearance:: Appears stated age Attitude/Demeanor/Rapport: Unable to Assess(pt confused) Affect (typically observed): Accepting Orientation: : Fluctuating Orientation (Suspected and/or reported Sundowners)(confused) Alcohol / Substance Use: Not Applicable Psych Involvement: No (comment)  Admission diagnosis:  Finger pain, left [M79.645] AKI (acute kidney injury) ([place]) [N17.9] Patient Active Problem List   Diagnosis Date Noted  . Metabolic acidosis 53/97/6734  . Open wound of finger, infected 01/22/2019  . Sepsis ([place]) 01/22/2019  . Acute encephalopathy 02/28/2016  . Lower urinary tract infectious disease   . Generalized weakness 11/11/2015  .  Acute renal failure superimposed on stage 3 chronic kidney disease ([place]) 11/11/2015  . Hyperkalemia 11/11/2015  . A-fib ([place]) 11/11/2015  . Hypokalemia 11/11/2015  . Gout   .  UTI (urinary tract infection) 07/23/2014  . Acute on chronic diastolic heart failure ([place]) 07/22/2014  . Atrial fibrillation, chronic ([place]) 07/22/2014  . Diabetes mellitus type 2, controlled ([place]) 07/22/2014  . Leg pain, bilateral 07/22/2014  . SUI (stress urinary incontinence, female) 05/16/2011  . Pelvic relaxation 05/16/2011  . ANEMIA 11/03/2009  . Essential hypertension 11/03/2009  . PREMATURE VENTRICULAR CONTRACTIONS 11/03/2009  . DYSPNEA 11/03/2009   PCP:  Iona Beard, MD Pharmacy:   [facility], [place] [address] Phone: [phone] Fax: [phone]     Social Determinants of Health (SDOH) Interventions    Readmission Risk Interventions No flowsheet data found.

## 2019-01-23 NOTE — Progress Notes (Signed)
PROGRESS NOTE    Courtney Bradford  LZJ:[id number] DOB: July 23, 1931 DOA: 01/22/2019 PCP: Iona Beard, MD   Brief Narrative:  The patient is an 85 year old African-American female with a past medical history significant for but not limited to known gouty arthritis on daily colchicine, chronic diastolic CHF with an EF of 60 to 65%, hypertension, paroxysmal atrial fibrillation currently anticoagulated as well as sick sinus syndrome, diabetes mellitus type 2, CKD stage III as well as other comorbidities who presented to the emergency room for chief complaint of hand pain and confusion.  Recently she was evaluated by [facility]. Al[facility] finger as had been draining and increased pain on 01/17/19.  She underwent partial excision under digital block and tophus removed and she was sent home on p.o. Keflex as cultures at that time grew out Staphylococcus aureus which is only resistant to erythromycin.  Her next few days she experienced fever and decreased appetite and son administered Tylenol daily.  He has not been eating or drinking well for last week and at baseline lives alone with a CNA coming to her house.    Son noticed a change in her and brought her to the emergency room and she was found to have an infected left finger and she was taken straight to the operating room.  Postoperatively she did well however she became bradycardic and her metoprolol stopped.  Cardiology was consulted and they recommended changing the patient's apixaban to heparin drip and will have EP [place].  Case was also discussed with Infectious Diseases who recommended since that she had a amputation to change antibiotics to p.o.   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 -Per Admitting Physician appeared Infected -X-Ray of Hand showed Complete osteolysis of the distal phalanx with progressive osteolysis of the distal aspect of the middle phalanx. There was also New soft tissue gas identified within the soft tissues of the distal ray. -Hand Surgery Consulted and took the patient to the OR yesterday and she had Surgical Intervention -Blood Cx's obtained prior to Surgery and showed NGTD <24 hours -Unfortunately aerobic/anaerobic culture of the finger was never sent -ESR was 89 and CRP was 3.7 last week and will repeat in the a.m. -Asked weeks finger culture grew out staph aureus which was only resistant to erythromycin -Patient was placed on broad-spectrum antibiotics hospitalization with IV Vancomycin and IV Zosyn -Discussed the case with Dr. Megan Salon who recommends changing back to p.o. Keflex and continuing antibiotics until the patient at least follows up with Dr. Apolonio Schneiders in the outpatient setting -Her physiology is not consistent with severe sepsis so Admitting Physician discontinued lactic acid blood draws given she also has AKI, as we know she has an infection localized to her finger we will manage empirically as above -Currently Afebrile and has no Leukocytosis   Dyspnea -Improved -On Room Air -Continue  to Monitor and if Necessary obtain CXR   Acute on Chronic Diastolic heart failure ([place]) -Patient paradoxically has worsening creatinine but had JVD on exam and mild lower extremity pitting edema yesterday -Check morning albumin and would probably hold amlodipine which can cause lower  extremity edema -BNP not obtained this AM and will obtain one in AM -Admitting Physician held her Lasix as well as her combination thiazide-ARB medication for now and will continue to Hold given AKI  -C/w Gentle IVF Hydration with NS at 50 mL/hr -Strict I's/O's and Daily Weights -Cardiology repeating an ECHOCardiogram   Aortic Valve Disease -Has a Hx of Moderate Aortic Regurgitation  -Cardiology ordering ECHO to evaluate  Paroxysmal Atrial Fibrillation -CHA2DS2-VASc was >4 and is on chronic Eliquis -Stopped Metoprolol 12.5 mg due to Bradycardia -C/w Telemetry -Changed Eliquis to IV Heparin given possible Pacer but son wants EP input first -Repeat EKG in AM    Diabetes Mellitus Type 2, controlled ([place]) -On a Heart Healthy / Carb Modified Diet now -CBG's ranging from 76-102 -If Necessary will place on Sensitive Novolog SSI AC   Lactic Acidosis -LA was 2.8 -Getting IVF as below  AKI on CKD Stage 3 -Combination of sepsis in addition to AKI from poor po intake -BUN/Cr went from 53/2.26 (on 01/18/2019) -> 86/2.85 -> 76/2.66 -C/w Gentle IV fluid 50 mL/hr x 10 more hours -Avoid Nephrotoxic Medications if possible and Contrast Dyes -Continue to Monitor and Trend Renal Function -Repeat CMP In AM   Sick Sinus Syndrome -Jeannett Senior has a Hx of A Fib/Flutter and today became extremely Bradycardic and Personal review of Stat EKG done Showed Mobitz Type 1 Block ([place]) -Stopped Metoprolol 12.5 mg po Daily -C/w Telemetry -Cardiology consulted for further evaluation and recommending continuing Telemetry and asking for EP's input in AM -Per Cardiology Hold Eliquis for PAF and start Heparin gtt incase Pacer is needed -Per my discussion with the Son he does not want Pacer placed but want's EP's Input first   Gout -Resumed Colchicine 0.6 mg po Daily  Hyperbilirubinemia -Patient's T Bili this AM was 1.3 -Likely Reactive -Continue to Monitor and Trend  -Repeat CMP in AM  Normocytic  Anemia/Anemia of Chronic Kidney Disease -Patient's Hb/Hct went from 10.3/33.5 -> 10.2/33.0 -> 8.0/24.9 -Likely Postoperative and Dilutional Drop -C/w Ferrous Fumarate 106 mg Elemental Iron Daily  -Check Anemia Panel and FOBT -Continue to Monitor for S/Sx of Bleeding now that she is back on AC with Heparin gtt -Repeat CBC in AM   Cataracts -Continue with Olopatadine 1 drop in both eyes twice daily  DVT prophylaxis: Anticoagulated with Apixaban but this was held and started on Heparin gtt Code Status: FULL CODE Family Communication: Discussed with son at bedside  Disposition Plan:   Consultants:   Orthopedic Hand Surgery Dr. Iran Planas  Discussed with ID Dr. Michel Bickers  Cardiology Dr. Johnsie Cancel  Procedures:  ECHOCARDIOGRAM 01/23/2019 done and pending read  Antimicrobials: Anti-infectives (From admission, onward)   Start     Dose/Rate Route Frequency Ordered Stop   01/24/19 1500  vancomycin (VANCOCIN) IVPB 1000 mg/200 mL premix     1,000 mg 200 mL/hr over 60 Minutes Intravenous Every 48 hours 01/22/19 1450     01/22/19 2300  piperacillin-tazobactam (ZOSYN) IVPB 3.375 g     3.375 g 12.5 mL/hr over 240 Minutes Intravenous Every 8 hours 01/22/19 1450     01/22/19 1445  piperacillin-tazobactam (ZOSYN) IVPB 3.375 g     3.375 g 100 mL/hr over 30 Minutes Intravenous  Once 01/22/19 1430 01/22/19 1524   01/22/19 1445  vancomycin (VANCOCIN) 1,500 mg in sodium chloride 0.9 % 500 mL IVPB     1,500 mg 250 mL/hr over 120 Minutes Intravenous  Once 01/22/19 1430 01/22/19 1920     Subjective: Seen and examined at bedside and was doing okay.  Denies chest pain, lightheadedness or dizziness.  No nausea or vomiting.  Feels okay but was hungry.  No other concerns or complaints at this time.  Objective: Vitals:   01/22/19 2004 01/23/19 0057 01/23/19 0500 01/23/19 0749  BP: (!) 129/43 (!) 124/56 (!) 125/53 (!) 122/44  Pulse: (!) 55 (!) 50 75 74  Resp: _0 Temp: 98.4 F (36.9 C)  97.9 F (36.6 C) 98.4 F (36.9 C) 98.5 F (36.9 C)  TempSrc: Oral Oral Oral Oral  SpO2: 100% 100% 100% 100%  Weight:   69.9 kg   Height:        Intake/Output Summary (Last 24 hours) at 01/23/2019 9798 Last data filed at 01/23/2019 9211 Gross per 24 hour  Intake 370 ml  Output 5 ml  Net 365 ml   Filed Weights   01/22/19 1527 01/23/19 0500  Weight: 73.5 kg 69.9 kg   Examination: Physical Exam:  Constitutional: WN/WD slightly overweight African-American female currently in NAD and appears calm and comfortable Eyes:  Lids and conjunctivae normal, sclerae anicteric; Has cataracts  ENMT: External Ears, Nose appear normal. Grossly normal hearing. Mucous membranes are moist.  Neck: Appears normal, supple, no cervical masses, normal ROM, no appreciable thyromegaly; no JVD Respiratory: Diminished to auscultation bilaterally, no wheezing, rales, rhonchi or crackles. Normal respiratory effort and patient is not tachypenic. No accessory muscle use.  Cardiovascular: RRR and slightly bradycardic, no murmurs / rubs / gallops. S1 and S2 auscultated.  Abdomen: Soft, non-tender, non-distended. No masses palpated. No appreciable hepatosplenomegaly. Bowel sounds positive x4.  GU: Deferred. Musculoskeletal: No clubbing / cyanosis of digits/nails. No joint deformity upper and lower extremities.  Skin: No rashes, lesions, ulcers on a limited skin evaluation. No induration; Warm and dry.  Neurologic: CN 2-12 grossly intact with no focal deficits.  Romberg sign and cerebellar reflexes not assessed.  Psychiatric: Normal judgment and insight. Alert and oriented x 3. Normal mood and appropriate affect.   Data Reviewed: I have personally reviewed following labs and imaging studies  CBC: Recent Labs  Lab 01/18/19 1424 01/22/19 1338 01/23/19 0324  WBC 7.7 8.6 5.4  NEUTROABS 6.0 7.0  --   HGB 10.3* 10.2* 8.0*  HCT 33.5* 33.0* 24.9*  MCV 91.3 90.7 89.2  PLT 235 294 941   Basic Metabolic Panel:  Recent Labs  Lab 01/18/19 1424 01/22/19 1338 01/23/19 0324  NA 141 135 140  K 3.7 5.0 4.6  CL 105 103 110  CO2 23 19* 20*  GLUCOSE 276* 176* 80  BUN 53* 86* 76*  CREATININE 2.26* 2.85* 2.66*  CALCIUM 9.4 9.6 8.8*  MG  --   --  1.9   GFR: Estimated Creatinine Clearance: 15.2 mL/min (A) (by C-G formula based on SCr of 2.66 mg/dL (H)). Liver Function Tests: Recent Labs  Lab 01/23/19 0324  AST 20  ALT 8  ALKPHOS 47  BILITOT 1.3*  PROT 6.0*  ALBUMIN 2.7*   No results for input(s): LIPASE, AMYLASE in the last 168 hours. No results for input(s): AMMONIA in the last 168 hours. Coagulation Profile: No results for input(s): INR, PROTIME in the last 168 hours. Cardiac Enzymes: No results for  input(s): CKTOTAL, CKMB, CKMBINDEX, TROPONINI in the last 168 hours. BNP (last 3 results) No results for input(s): PROBNP in the last 8760 hours. HbA1C: No results for input(s): HGBA1C in the last 72 hours. CBG: Recent Labs  Lab 01/22/19 1528 01/22/19 1852 01/22/19 2111 01/23/19 0644  GLUCAP 187* 114* 102* 76   Lipid Profile: No results for input(s): CHOL, HDL, LDLCALC, TRIG, CHOLHDL, LDLDIRECT in the last 72 hours. Thyroid Function Tests: No results for input(s): TSH, T4TOTAL, FREET4, T3FREE, THYROIDAB in the last 72 hours. Anemia Panel: No results for input(s): VITAMINB12, FOLATE, FERRITIN, TIBC, IRON, RETICCTPCT in the last 72 hours. Sepsis Labs: Recent Labs  Lab 01/22/19 1555  LATICACIDVEN 2.8*    Recent Results (from the past 240 hour(s))  Aerobic Culture (superficial specimen)     Status: None   Collection Time: 01/18/19  3:53 PM  Result Value Ref Range Status   Specimen Description   Final    FINGER Performed at [facility], [address] [place], [place] [address]    Special Requests   Final    LEFT FINGER Performed at [facility], [address] [place], [place] [address]    Gram Stain   Final    RARE WBC PRESENT, PREDOMINANTLY PMN NO ORGANISMS SEEN  Performed at [facility] [address] [place], [place] [address]    Culture RARE STAPHYLOCOCCUS AUREUS  Final   Report Status 01/22/2019 FINAL  Final   Organism ID, Bacteria STAPHYLOCOCCUS AUREUS  Final      Susceptibility   Staphylococcus aureus - MIC*    CIPROFLOXACIN <=0.5 SENSITIVE Sensitive     ERYTHROMYCIN >=8 RESISTANT Resistant     GENTAMICIN <=0.5 SENSITIVE Sensitive     OXACILLIN <=0.25 SENSITIVE Sensitive     TETRACYCLINE <=1 SENSITIVE Sensitive     VANCOMYCIN <=0.5 SENSITIVE Sensitive     TRIMETH/SULFA <=10 SENSITIVE Sensitive     CLINDAMYCIN <=0.25 SENSITIVE Sensitive     RIFAMPIN <=0.5 SENSITIVE Sensitive     Inducible Clindamycin NEGATIVE Sensitive     * RARE STAPHYLOCOCCUS AUREUS    Radiology Studies: Dg Finger Index Left  Result Date: 01/22/2019 CLINICAL DATA:  Diabetes. Necrotic left index finger for several months. EXAM: LEFT INDEX FINGER 2+V COMPARISON:  01/18/2019 FINDINGS: Diffuse soft tissue swelling overlying the distal second ray identified. There are new foci of gas there is a new focus of soft tissue gas identified. Progressive there is complete osteolysis of the distal phalanx with progressive osteolysis distal aspect of the middle phalanx. IMPRESSION: 1. Complete osteolysis of the distal phalanx with progressive osteolysis of the distal aspect of the middle phalanx. 2. New soft tissue gas identified within the soft tissues of the distal ray. Electronically Signed   By: Kerby Moors M.D.   On: 01/22/2019 14:09   Scheduled Meds: . apixaban  5 mg Oral BID  . colchicine  0.6 mg Oral Daily  . Ferrous Fumarate  1 tablet Oral q morning - 10a  . metoprolol tartrate  12.5 mg Oral Daily  . olopatadine  1 drop Both Eyes BID   Continuous Infusions: . sodium chloride    . sodium chloride 50 mL/hr at 01/22/19 2007  . sodium chloride 50 mL/hr at 01/22/19 1720  . piperacillin-tazobactam (ZOSYN)  IV 3.375 g (01/23/19 0541)  . [START ON 01/24/2019]  vancomycin      LOS: 1 day   Kerney Elbe, DO [facility] PAGER is on AMION  If 7PM-7AM, please contact night-coverage  [email or web address] Password [email or web address] 01/23/2019, 8:11 AM

## 2019-01-23 NOTE — Progress Notes (Signed)
  Echocardiogram 2D Echocardiogram has been performed.  Courtney Bradford 01/23/2019, 3:56 PM

## 2019-01-23 NOTE — Telephone Encounter (Signed)
Post ED Visit - Positive Culture Follow-up  Culture report reviewed by antimicrobial stewardship pharmacist: [place] Team []  Elenor Quinones, Pharm.D. []  Heide Guile, Pharm.D., BCPS AQ-ID []  Parks Neptune, Pharm.D., BCPS []  Alycia Rossetti, Pharm.D., BCPS []  [place], [place].D., BCPS, AAHIVP []  Legrand Como, Pharm.D., BCPS, AAHIVP []  Salome Arnt, PharmD, BCPS []  Johnnette Gourd, PharmD, BCPS [x]  Hughes Better, PharmD, BCPS []  Leeroy Cha, PharmD []  Laqueta Linden, PharmD, BCPS []  Albertina Parr, PharmD  [place] Team []  Leodis Sias, PharmD []  Lindell Spar, PharmD []  Royetta Asal, PharmD []  Graylin Shiver, Rph []  Rema Fendt) Glennon Mac, PharmD []  Arlyn Dunning, PharmD []  Netta Cedars, PharmD []  Dia Sitter, PharmD []  Leone Haven, PharmD []  Gretta Arab, PharmD []  Theodis Shove, PharmD []  Peggyann Juba, PharmD []  Reuel Boom, PharmD   Positive wound culture Treated with Cephalexin, organism sensitive to the same and no further patient follow-up is required at this time.  Harlon Flor [facility] 01/23/2019, 8:01 AM

## 2019-01-23 NOTE — Progress Notes (Signed)
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   She has cognitive impairment and demonstrate unmet needs for activities of daily living with decreased ability for hands on assistance; she needs extensive assistance with bathing, dressing, toileting, eating and mobility.

## 2019-01-24 DIAGNOSIS — D649 Anemia, unspecified: Secondary | ICD-10-CM

## 2019-01-24 DIAGNOSIS — I495 Sick sinus syndrome: Secondary | ICD-10-CM

## 2019-01-24 DIAGNOSIS — I4430 Unspecified atrioventricular block: Secondary | ICD-10-CM

## 2019-01-24 LAB — CBC WITH DIFFERENTIAL/PLATELET
ABS IMMATURE GRANULOCYTES: 0.04 10*3/uL (ref 0.00–0.07)
Abs Immature Granulocytes: 0.02 10*3/uL (ref 0.00–0.07)
Basophils Absolute: 0 10*3/uL (ref 0.0–0.1)
Basophils Absolute: 0 10*3/uL (ref 0.0–0.1)
Basophils Relative: 1 %
Basophils Relative: 1 %
EOS PCT: 5 %
Eosinophils Absolute: 0.3 10*3/uL (ref 0.0–0.5)
Eosinophils Absolute: 0.3 10*3/uL (ref 0.0–0.5)
Eosinophils Relative: 5 %
HCT: 24.5 % — ABNORMAL LOW (ref 36.0–46.0)
HCT: 29.1 % — ABNORMAL LOW (ref 36.0–46.0)
Hemoglobin: 7.5 g/dL — ABNORMAL LOW (ref 12.0–15.0)
Hemoglobin: 8.9 g/dL — ABNORMAL LOW (ref 12.0–15.0)
Immature Granulocytes: 0 %
Immature Granulocytes: 1 %
LYMPHS ABS: 1.3 10*3/uL (ref 0.7–4.0)
Lymphocytes Relative: 30 %
Lymphocytes Relative: 25 %
Lymphs Abs: 1.5 10*3/uL (ref 0.7–4.0)
MCH: 27.5 pg (ref 26.0–34.0)
MCH: 27.8 pg (ref 26.0–34.0)
MCHC: 30.6 g/dL (ref 30.0–36.0)
MCHC: 30.6 g/dL (ref 30.0–36.0)
MCV: 89.7 fL (ref 80.0–100.0)
MCV: 90.9 fL (ref 80.0–100.0)
Monocytes Absolute: 0.5 10*3/uL (ref 0.1–1.0)
Monocytes Absolute: 0.4 10*3/uL (ref 0.1–1.0)
Monocytes Relative: 9 %
Monocytes Relative: 8 %
Neutro Abs: 2.7 10*3/uL (ref 1.7–7.7)
Neutro Abs: 3.2 10*3/uL (ref 1.7–7.7)
Neutrophils Relative %: 55 %
Neutrophils Relative %: 60 %
Platelets: 240 10*3/uL (ref 150–400)
Platelets: 269 10*3/uL (ref 150–400)
RBC: 2.73 MIL/uL — ABNORMAL LOW (ref 3.87–5.11)
RBC: 3.2 MIL/uL — ABNORMAL LOW (ref 3.87–5.11)
RDW: 13.5 % (ref 11.5–15.5)
RDW: 13.4 % (ref 11.5–15.5)
WBC: 5 10*3/uL (ref 4.0–10.5)
WBC: 5.2 10*3/uL (ref 4.0–10.5)
nRBC: 0 % (ref 0.0–0.2)
nRBC: 0 % (ref 0.0–0.2)

## 2019-01-24 LAB — COMPREHENSIVE METABOLIC PANEL
ALT: 9 U/L (ref 0–44)
ANION GAP: 8 (ref 5–15)
AST: 16 U/L (ref 15–41)
Albumin: 2.8 g/dL — ABNORMAL LOW (ref 3.5–5.0)
Alkaline Phosphatase: 48 U/L (ref 38–126)
BUN: 50 mg/dL — ABNORMAL HIGH (ref 8–23)
CO2: 22 mmol/L (ref 22–32)
Calcium: 9.4 mg/dL (ref 8.9–10.3)
Chloride: 112 mmol/L — ABNORMAL HIGH (ref 98–111)
Creatinine, Ser: 2 mg/dL — ABNORMAL HIGH (ref 0.44–1.00)
GFR calc non Af Amer: 22 mL/min — ABNORMAL LOW (ref >60)
GFR, EST AFRICAN AMERICAN: 26 mL/min — AB (ref >60)
Glucose, Bld: 85 mg/dL (ref 70–99)
POTASSIUM: 4.1 mmol/L (ref 3.5–5.1)
Sodium: 142 mmol/L (ref 135–145)
Total Bilirubin: 1 mg/dL (ref 0.3–1.2)
Total Protein: 5.9 g/dL — ABNORMAL LOW (ref 6.5–8.1)

## 2019-01-24 LAB — GLUCOSE, CAPILLARY
Glucose-Capillary: 72 mg/dL (ref 70–99)
Glucose-Capillary: 141 mg/dL — ABNORMAL HIGH (ref 70–99)
Glucose-Capillary: 104 mg/dL — ABNORMAL HIGH (ref 70–99)
Glucose-Capillary: 113 mg/dL — ABNORMAL HIGH (ref 70–99)

## 2019-01-24 LAB — FOLATE: Folate: 8.1 ng/mL (ref >5.9)

## 2019-01-24 LAB — FERRITIN: Ferritin: 387 ng/mL — ABNORMAL HIGH (ref 11–307)

## 2019-01-24 LAB — MAGNESIUM: Magnesium: 1.7 mg/dL (ref 1.7–2.4)

## 2019-01-24 LAB — APTT: aPTT: 138 seconds — ABNORMAL HIGH (ref 24–36)

## 2019-01-24 LAB — IRON AND TIBC
Iron: 35 ug/dL (ref 28–170)
Saturation Ratios: 23 % (ref 10.4–31.8)
TIBC: 153 ug/dL — ABNORMAL LOW (ref 250–450)
UIBC: 118 ug/dL

## 2019-01-24 LAB — PHOSPHORUS: Phosphorus: 3 mg/dL (ref 2.5–4.6)

## 2019-01-24 LAB — RETICULOCYTES
Immature Retic Fract: 11.9 % (ref 2.3–15.9)
RBC.: 2.73 MIL/uL — AB (ref 3.87–5.11)
RETIC COUNT ABSOLUTE: 50 10*3/uL (ref 19.0–186.0)
RETIC CT PCT: 1.8 % (ref 0.4–3.1)

## 2019-01-24 LAB — HEPARIN LEVEL (UNFRACTIONATED): Heparin Unfractionated: >2.2 IU/mL — ABNORMAL HIGH (ref 0.30–0.70)

## 2019-01-24 LAB — VITAMIN B12: Vitamin B-12: 202 pg/mL (ref 180–914)

## 2019-01-24 MED ORDER — COLCHICINE 0.6 MG PO TABS
0.3000 mg | ORAL_TABLET | Freq: Every day | ORAL | Status: DC
  Administered 2019-01-24 – 2019-01-25 (×2): 0.3 mg via ORAL
  Filled 2019-01-24 (×2): qty 0.5

## 2019-01-24 MED ORDER — APIXABAN 2.5 MG PO TABS
2.5000 mg | ORAL_TABLET | Freq: Two times a day (BID) | ORAL | Status: DC
  Administered 2019-01-24 – 2019-01-25 (×3): 2.5 mg via ORAL
  Filled 2019-01-24 (×3): qty 1

## 2019-01-24 NOTE — Progress Notes (Signed)
PROGRESS NOTE    Courtney Bradford  NID:[id number] DOB: [birth year] DOA: 01/22/2019 PCP: Iona Beard, MD   Brief Narrative:  The patient is an 85 year old African-American female with a past medical history significant for but not limited to known gouty arthritis on daily colchicine, chronic diastolic CHF with an EF of 60 to 65%, hypertension, paroxysmal atrial fibrillation currently anticoagulated as well as sick sinus syndrome, diabetes mellitus type 2, CKD stage III as well as other comorbidities who presented to the emergency room for chief complaint of hand pain and confusion.  Recently she was evaluated [facility] finger as had been draining and increased pain on 01/17/19.  She underwent partial excision under digital block and tophus removed and she was sent home on p.o. Keflex as cultures at that time grew out Staphylococcus aureus which is only resistant to erythromycin.  Her next few days she experienced fever and decreased appetite and son administered Tylenol daily.  He has not been eating or drinking well for last week and at baseline lives alone with a CNA coming to her house.    Son noticed a change in her and brought her to the emergency room and she was found to have an infected left finger and she was taken straight to the operating room.  Postoperatively she did well however she became bradycardic and her metoprolol stopped.  Cardiology was consulted and they recommended changing the patient's apixaban to heparin drip and will have EP Evaluate but feel that since she is asymptomatic will recommend continuing monitor with a 30 Day Monitor and have changed Heparin gtt back to Eliquis.  Case was also discussed with Infectious Diseases who recommended since that she had a amputation to change antibiotics to p.o.   EP to evaluate today. Hospitalization has been complicated by Anemia and dropping Hemoglobin. FOBT has been ordered and will be repeating  CBC this afternoon. Renal Fxn has improved.  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 -Per Admitting Physician appeared Infected -X-Ray of Hand showed Complete osteolysis of the distal phalanx with progressive osteolysis of the distal aspect of the middle phalanx. There was also New soft tissue gas identified within the soft tissues of the distal ray. -Hand Surgery Consulted and took the patient to the OR yesterday and she had Surgical Intervention -Blood Cx's obtained prior to Surgery and showed NGTD <24 hours -Unfortunately aerobic/anaerobic culture of the finger was never sent -ESR was 89 and CRP was 3.7 last week and will repeat in the a.m. -Asked weeks finger culture grew out Staph aureus which was only resistant to erythromycin -Patient was placed on broad-spectrum antibiotics hospitalization with IV Vancomycin and IV Zosyn -Discussed the case with Dr. Megan Salon who recommends changing back to p.o. Keflex and continuing antibiotics until the patient at least follows up with Dr. Apolonio Schneiders in the outpatient  setting -Her physiology is not consistent with severe sepsis so Admitting Physician discontinued lactic acid blood draws given she also has AKI, as we know she has an infection localized to her finger we will manage empirically as above -Currently Afebrile and has no Leukocytosis (5.0)   Dyspnea -Improved -On Room Air -Continue to Monitor and if Necessary obtain CXR   Acute on Chronic Diastolic heart failure ([place]) -Patient paradoxically has worsening creatinine but had JVD on exam and mild lower extremity pitting edema yesterday -Check morning albumin and would probably hold amlodipine which can cause lower extremity edema -BNP not obtained this AM and will obtain one in AM -Admitting Physician held her Lasix as well as her combination thiazide-ARB medication for now and will continue to Hold given AKI for now -Gentle IVF Hydration with NS at 50 mL/hr now stopped. -Strict I's/O's and Daily Weights; Weight is down 15 lbs and patient is +1.256 Liters since admission  -Cardiology Repeating an ECHOCardiogram and showed LV has Low Normal Systolic Fxn with an EF of 92-42% and LV Diastolic Parameters were consistent with Pseudonormalization   Aortic Valve Disease -Has a Hx of Moderate Aortic Regurgitation  -Cardiology ordering ECHO to evaluate and showed The aortic valve is tricuspid Mild thickening of the aortic valve Mild calcification of the aortic valve. Aortic valve regurgitation is mild by color flow Doppler  Paroxysmal Atrial Fibrillation, currently in Sinus Brady to Sinus Rhythm -CHA2DS2-VASc was >4 and is on chronic Eliquis -Stopped Metoprolol 12.5 mg due to Bradycardia -C/w Telemetry -Changed Eliquis to IV Heparin given possible Pacer but son wants EP input first; IV Heparin changed back to Eliquis given Cardiology evaluation as Dr. Johnsie Cancel feels that given her chronicity and asymptomatic state with her recent finger infection he does not feel a permanent pacemaker is indicated at this time and would like to do a 30-day event monitor off of beta-blocker follow-up with EP -EP to Evaluate today  -Repeat EKG in AM    Diabetes Mellitus Type 2, controlled ([place]) -On a Heart Healthy / Carb Modified Diet now -CBG's ranging from 72-141 -If Necessary will place on Sensitive  Novolog SSI AC   Lactic Acidosis -LA was 2.8 -IVF as below now stopped   AKI on CKD Stage 3, improved  -Combination of sepsis in addition to AKI from poor po intake -BUN/Cr went from 53/2.26 (on 01/18/2019) -> 86/2.85 -> 76/2.66 -> 50/2.00 -Received Gentle IV fluid 50 mL/hr x 10 hours -Avoid Nephrotoxic Medications if possible and Contrast Dyes -Continue to Monitor and Trend Renal Function -Repeat CMP In AM   Sick Sinus Syndrome/AV Block -Pate has a Hx of A Fib/Flutter and yesterday became extremely Bradycardic and Personal review of Stat EKG done Showed Mobitz Type 1 Block ([place]) -Stopped Metoprolol 12.5 mg po Daily -C/w Telemetry -Cardiology consulted for further evaluation and recommending continuing Telemetry and asking for EP's input this AM -Per Cardiology yesterday Hold Eliquis for PAF and start Heparin gtt incase Pacer is needed but now recommending changing back to Eliquis as Dr. Johnsie Cancel feels that given the Chronicity and Asymptomatic state along with finger infection that PPM is not currently indicated   -Per my discussion with the Son he does not want Pacer placed but want's EP's Input first  -EP to Evaluate  Gout -Resumed Colchicine 0.6 mg po Daily but reduced dose as CrCl was <20  Hyperbilirubinemia -Patient's T Bili yesterday AM was 1.3 and repeat this AM is 1.0 -Likely Reactive -Continue to Monitor and Trend  -  Repeat CMP in AM  Normocytic Anemia/Anemia of Chronic Kidney Disease -Patient's Hb/Hct went from 10.3/33.5 -> 10.2/33.0 -> 8.0/24.9 -> 7.5/24.5; WILL REPEAT CBC at [facility] today and pending  -Likely Postoperative and Dilutional Drop -C/w Ferrous Fumarate 106 mg Elemental Iron Daily  -Check Anemia Panel and FOBT -Anemia Panel showed iron level of 35, U IBC of 118, TIBC 153, saturation ratios of 23%, ferritin of 387, folate level 8.1, and vitamin B12 level of 202 -FOBT ordered and pending  -Transfuse if HB <7.0 -Continue to Monitor for S/Sx of Bleeding now  that she is back on AC  -Repeat CBC in AM   Cataracts -Continue with Olopatadine 1 drop in both eyes twice daily  DVT prophylaxis: Anticoagulated with Apixaban Code Status: FULL CODE Family Communication: Discussed with son at bedside  Disposition Plan: Home with Home Health likely when stable and cleared by Cardiology   Consultants:   Orthopedic Hand Surgery Dr. Iran Planas  Discussed with ID Dr. Michel Bickers  Medical Cardiology Dr. Johnsie Cancel  EP Cardiology  Procedures:  ECHOCARDIOGRAM 01/23/2019 IMPRESSIONS    1. The left ventricle has low normal systolic function, with an ejection fraction of 50-55%. The cavity size was normal. Left ventricular diastolic Doppler parameters are consistent with pseudonormalization. No evidence of left ventricular regional wall  motion abnormalities.  2. The right ventricle has normal systolic function. The cavity was normal. There is no increase in right ventricular wall thickness.  3. Left atrial size was severely dilated.  4. The aortic valve is tricuspid Mild thickening of the aortic valve Mild calcification of the aortic valve. Aortic valve regurgitation is mild by color flow Doppler.  5. The aortic root and ascending aorta are normal in size and structure.  6. There is left bowing of the interatrial septum, suggestive of elevated right atrial pressure.  FINDINGS  Left Ventricle: The left ventricle has low normal systolic function, with an ejection fraction of 50-55%. The cavity size was normal. There is no increase in left ventricular wall thickness. Left ventricular diastolic Doppler parameters are consistent  with pseudonormalization. No evidence of left ventricular regional wall motion abnormalities.. Right Ventricle: The right ventricle has normal systolic function. The cavity was normal. There is no increase in right ventricular wall thickness. Left Atrium: left atrial size was severely dilated Right Atrium: right atrial size was  normal in size. Right atrial pressure is estimated at 3 mmHg. Interatrial Septum: No atrial level shunt detected by color flow Doppler. There is left bowing of the interatrial septum, suggestive of elevated right atrial pressure. Pericardium: There is no evidence of pericardial effusion. Mitral Valve: The mitral valve is normal in structure. Mitral valve regurgitation is mild by color flow Doppler. Tricuspid Valve: The tricuspid valve is normal in structure. Tricuspid valve regurgitation is mild by color flow Doppler. Aortic Valve: The aortic valve is tricuspid Mild thickening of the aortic valve Mild calcification of the aortic valve. Aortic valve regurgitation is mild by color flow Doppler. There is no evidence of aortic valve stenosis. Pulmonic Valve: The pulmonic valve was grossly normal. Pulmonic valve regurgitation is not visualized by color flow Doppler. Aorta: The aortic root and ascending aorta are normal in size and structure. Venous: The inferior vena cava is normal in size with greater than 50% respiratory variability.   LEFT VENTRICLE PLAX 2D LVIDd:         3.80 cm  Diastology LVIDs:         3.10 cm  LV e' lateral:  6.83 cm/s LV PW:         1.30 cm  LV E/e' lateral: 11.6 LV IVS:        1.10 cm  LV e' medial:    2.70 cm/s LVOT diam:     2.00 cm  LV E/e' medial:  29.3 LV SV:         24 ml LV SV Index:   13.33 LVOT Area:     3.14 cm  RIGHT VENTRICLE RV S prime:     9.83 cm/s TAPSE (M-mode): 2.4 cm RVSP:           39.6 mmHg  LEFT ATRIUM              Index       RIGHT ATRIUM           Index LA diam:        5.20 cm  2.94 cm/m  RA Pressure: 3 mmHg LA Vol (A2C):   117.0 ml 66.08 ml/m RA Area:     13.90 cm LA Vol (A4C):   104.8 ml 59.16 ml/m RA Volume:   30.60 ml  17.28 ml/m LA Biplane Vol: 115.0 ml 64.95 ml/m  AORTIC VALVE LVOT Vmax:   91.70 cm/s LVOT Vmean:  57.000 cm/s LVOT VTI:    0.239 m AR PHT:      479 msec   AORTA Ao Root diam: 2.90 cm Ao Asc diam:  2.80  cm  MITRAL VALVE              TRICUSPID VALVE MV Area (PHT): 4.15 cm   TR Peak grad:   36.6 mmHg MV PHT:        53.07 msec TR Vmax:        323.00 cm/s MV Decel Time: 183 msec   RVSP:           39.6 mmHg MV E velocity: 79.20 cm/s MV A velocity: 76.40 cm/s SHUNTS MV E/A ratio:  1.04       Systemic VTI:  0.24 m                           Systemic Diam: 2.00 cm  Antimicrobials: Anti-infectives (From admission, onward)   Start     Dose/Rate Route Frequency Ordered Stop   01/24/19 1500  vancomycin (VANCOCIN) IVPB 1000 mg/200 mL premix  Status:  Discontinued     1,000 mg 200 mL/hr over 60 Minutes Intravenous Every 48 hours 01/22/19 1450 01/23/19 1004   01/23/19 1400  cephALEXin (KEFLEX) capsule 250 mg     250 mg Oral Every 8 hours 01/23/19 1004     01/22/19 2300  piperacillin-tazobactam (ZOSYN) IVPB 3.375 g  Status:  Discontinued     3.375 g 12.5 mL/hr over 240 Minutes Intravenous Every 8 hours 01/22/19 1450 01/23/19 1004   01/22/19 1445  piperacillin-tazobactam (ZOSYN) IVPB 3.375 g     3.375 g 100 mL/hr over 30 Minutes Intravenous  Once 01/22/19 1430 01/22/19 1524   01/22/19 1445  vancomycin (VANCOCIN) 1,500 mg in sodium chloride 0.9 % 500 mL IVPB     1,500 mg 250 mL/hr over 120 Minutes Intravenous  Once 01/22/19 1430 01/22/19 1920     Subjective: Seen and examined at bedside and stated that she was feeling much better and ate all her breakfast.  Denies chest pain, lightheadedness or dizziness.  No evidence of any bleeding and denies any hematuria or any bloody bowel movement.  No other concerns or complaints at this time and is feeling well.  Objective: Vitals:   01/24/19 0523 01/24/19 0604 01/24/19 0801 01/24/19 1113  BP:  (!) 143/44 (!) 160/57 (!) 133/59  Pulse:  61 60 71  Resp:  '20 17 20  ' Temp:  98.3 F (36.8 C) (!) 97.5 F (36.4 C) 98.1 F (36.7 C)  TempSrc:  Oral Oral Oral  SpO2:  100% 100% 100%  Weight: 67 kg     Height:        Intake/Output Summary (Last 24 hours)  at 01/24/2019 1204 Last data filed at 01/24/2019 1000 Gross per 24 hour  Intake 891.35 ml  Output -  Net 891.35 ml   Filed Weights   01/22/19 1527 01/23/19 0500 01/24/19 0523  Weight: 73.5 kg 69.9 kg 67 kg   Examination: Physical Exam:  Constitutional: Well Nourished, well developed African-American female currently no acute distress appears calm and comfortable and had just finished breakfast Eyes: Conjunctive are normal.  Sclera anicteric.  ENMT: External ears nose appear normal.  Mucous members are moist Neck: Supple no JVD Respiratory: Diminished auscultation bilaterally no appreciable wheezing, rales, rhonchi.  Patient not tachypneic or using any accessory muscles to breathe; unlabored breathing  Cardiovascular: Slightly bradycardic but then rates up into the 70's. No visible murmurs, rubs, gallops.  No lower extremity edema noted Abdomen: Left, nontender, nondistended.  Bowel sounds present GU: Deferred Musculoskeletal: No contractures or cyanosis; No joint deformities noted Skin: No appreciable rashes or lesions on to skin evaluation. No induration noted. Neurologic: Cranial nerves II through XII gross intact no appreciable focal deficits.  Romberg sign cerebellar reflexes were not assessed Psychiatric: Judgment and insight.  Patient is awake, alert and oriented x3.  Pleasant mood and affect  Data Reviewed: I have personally reviewed following labs and imaging studies  CBC: Recent Labs  Lab 01/18/19 1424 01/22/19 1338 01/23/19 0324 01/24/19 0500  WBC 7.7 8.6 5.4 5.0  NEUTROABS 6.0 7.0  --  2.7  HGB 10.3* 10.2* 8.0* 7.5*  HCT 33.5* 33.0* 24.9* 24.5*  MCV 91.3 90.7 89.2 89.7  PLT 235 294 246 364   Basic Metabolic Panel: Recent Labs  Lab 01/18/19 1424 01/22/19 1338 01/23/19 0324 01/24/19 0500  NA 141 135 140 142  K 3.7 5.0 4.6 4.1  CL 105 103 110 112*  CO2 23 19* 20* 22  GLUCOSE 276* 176* 80 85  BUN 53* 86* 76* 50*  CREATININE 2.26* 2.85* 2.66* 2.00*   CALCIUM 9.4 9.6 8.8* 9.4  MG  --   --  1.9 1.7  PHOS  --   --   --  3.0   GFR: Estimated Creatinine Clearance: 18.5 mL/min (A) (by C-G formula based on SCr of 2 mg/dL (H)). Liver Function Tests: Recent Labs  Lab 01/23/19 0324 01/24/19 0500  AST 20 16  ALT 8 9  ALKPHOS 47 48  BILITOT 1.3* 1.0  PROT 6.0* 5.9*  ALBUMIN 2.7* 2.8*   No results for input(s): LIPASE, AMYLASE in the last 168 hours. No results for input(s): AMMONIA in the last 168 hours. Coagulation Profile: No results for input(s): INR, PROTIME in the last 168 hours. Cardiac Enzymes: No results for input(s): CKTOTAL, CKMB, CKMBINDEX, TROPONINI in the last 168 hours. BNP (last 3 results) No results for input(s): PROBNP in the last 8760 hours. HbA1C: No results for input(s): HGBA1C in the last 72 hours. CBG: Recent Labs  Lab 01/23/19 1138 01/23/19 1622 01/23/19 2137 01/24/19 0612 01/24/19 1145  GLUCAP 76 77 126* 72 141*   Lipid Profile: No results for input(s): CHOL, HDL, LDLCALC, TRIG, CHOLHDL, LDLDIRECT in the last 72 hours. Thyroid Function Tests: No results for input(s): TSH, T4TOTAL, FREET4, T3FREE, THYROIDAB in the last 72 hours. Anemia Panel: Recent Labs    01/24/19 0500  VITAMINB12 202  FOLATE 8.1  FERRITIN 387*  TIBC 153*  IRON 35  RETICCTPCT 1.8   Sepsis Labs: Recent Labs  Lab 01/22/19 1555  LATICACIDVEN 2.8*    Recent Results (from the past 240 hour(s))  Aerobic Culture (superficial specimen)     Status: None   Collection Time: 01/18/19  3:53 PM  Result Value Ref Range Status   Specimen Description   Final    FINGER Performed at [facility], [address], [place], [place] [address]    Special Requests   Final    LEFT FINGER Performed at [facility], [address], [place], [place] [address]    Gram Stain   Final    RARE WBC PRESENT, PREDOMINANTLY PMN NO ORGANISMS SEEN Performed at [facility], [place] [address] [place], [place] [address]    Culture RARE  STAPHYLOCOCCUS AUREUS  Final   Report Status 01/22/2019 FINAL  Final   Organism ID, Bacteria STAPHYLOCOCCUS AUREUS  Final      Susceptibility   Staphylococcus aureus - MIC*    CIPROFLOXACIN <=0.5 SENSITIVE Sensitive     ERYTHROMYCIN >=8 RESISTANT Resistant     GENTAMICIN <=0.5 SENSITIVE Sensitive     OXACILLIN <=0.25 SENSITIVE Sensitive     TETRACYCLINE <=1 SENSITIVE Sensitive     VANCOMYCIN <=0.5 SENSITIVE Sensitive     TRIMETH/SULFA <=10 SENSITIVE Sensitive     CLINDAMYCIN <=0.25 SENSITIVE Sensitive     RIFAMPIN <=0.5 SENSITIVE Sensitive     Inducible Clindamycin NEGATIVE Sensitive     * RARE STAPHYLOCOCCUS AUREUS  Culture, blood (routine x 2)     Status: None (Preliminary result)   Collection Time: 01/22/19  2:19 PM  Result Value Ref Range Status   Specimen Description BLOOD RIGHT ANTECUBITAL  Final   Special Requests   Final    BOTTLES DRAWN AEROBIC AND ANAEROBIC Blood Culture adequate volume   Culture   Final    NO GROWTH 2 DAYS Performed at [facility], [address], [place], [place] [address]    Report Status PENDING  Incomplete  Culture, blood (routine x 2)     Status: None (Preliminary result)   Collection Time: 01/22/19  2:24 PM  Result Value Ref Range Status   Specimen Description BLOOD RIGHT HAND  Final   Special Requests   Final    BOTTLES DRAWN AEROBIC ONLY Blood Culture results may not be optimal due to an inadequate volume of blood received in culture bottles   Culture   Final    NO GROWTH 2 DAYS Performed at [facility], [place] [address], [place], [place] [address]    Report Status PENDING  Incomplete    Radiology Studies: Dg Finger Index Left  Result Date: 01/22/2019 CLINICAL DATA:  Diabetes. Necrotic left index finger for several months. EXAM: LEFT INDEX FINGER 2+V COMPARISON:  01/18/2019 FINDINGS: Diffuse soft tissue swelling overlying the distal second ray identified. There are new foci of gas there is a new focus of soft tissue gas  identified. Progressive there is complete osteolysis of the distal phalanx with progressive osteolysis distal aspect of the middle phalanx. IMPRESSION: 1. Complete osteolysis of the distal phalanx  with progressive osteolysis of the distal aspect of the middle phalanx. 2. New soft tissue gas identified within the soft tissues of the distal ray. Electronically Signed   By: Kerby Moors M.D.   On: 01/22/2019 14:09   Scheduled Meds: . apixaban  2.5 mg Oral BID  . cephALEXin  250 mg Oral Q8H  . colchicine  0.3 mg Oral Daily  . Ferrous Fumarate  1 tablet Oral q morning - 10a  . olopatadine  1 drop Both Eyes BID   Continuous Infusions:   LOS: 2 days   Kerney Elbe, DO [facility] PAGER is on AMION  If 7PM-7AM, please contact night-coverage [email or web address] Password [facility] 01/24/2019, 12:04 PM

## 2019-01-24 NOTE — Progress Notes (Signed)
ANTICOAGULATION CONSULT NOTE - Initial Consult  Pharmacy Consult for switch heparin back to Eliquis Indication: atrial fibrillation  Allergies  Allergen Reactions  . Lactose Intolerance (Gi) Other (See Comments)    Upset stomach    Patient Measurements: Height: 5\' 5"  (165.1 cm) Weight: 147 lb 11.2 oz (67 kg) IBW/kg (Calculated) : 57 Heparin Dosing Weight: 69.9 kg  Vital Signs: Temp: 97.5 F (36.4 C) (03/27 0801) Temp Source: Oral (03/27 0801) BP: 160/57 (03/27 0801) Pulse Rate: 60 (03/27 0801)  Labs: Recent Labs    01/22/19 1338 01/23/19 0324 01/24/19 0500  HGB 10.2* 8.0* 7.5*  HCT 33.0* 24.9* 24.5*  PLT 294 246 240  APTT  --   --  138*  HEPARINUNFRC  --   --  >2.20*  CREATININE 2.85* 2.66* 2.00*    Estimated Creatinine Clearance: 18.5 mL/min (A) (by C-G formula based on SCr of 2 mg/dL (H)).   Medical History: Past Medical History:  Diagnosis Date  . A-fib ([place])   . Anemia   . Arthritis    right knee  . Cataract   . CHF (congestive heart failure) ([place])   . CKD (chronic kidney disease), stage III ([place])   . Dehydration   . Diabetes mellitus   . Gout   . Headache(784.0)   . Hypertension   . UTI (lower urinary tract infection)     Medications:  Scheduled:  . cephALEXin  250 mg Oral Q8H  . colchicine  0.3 mg Oral Daily  . Ferrous Fumarate  1 tablet Oral q morning - 10a  . olopatadine  1 drop Both Eyes BID    Assessment: 85 yo female on chronic Eliquis for afib.  Pharmacy asked to transition to IV heparin in case pacer is needed this admission.   Decision today for no pacer at this point.  Will transition heparin back to Eliquis today.  Although her PTA Eliquis dose was 5 mg BID, based on her renal dysfunction and age, she should be taking reduced dose of 2.5 mg BID.  Goal of Therapy:  Monitor platelets by anticoagulation protocol: Yes   Plan:  Stop heparin. Start Eliquis 2.5 mg BID.  Marguerite Olea, [facility] Clinical  Pharmacist Phone [phone]  01/24/2019 9:46 AM

## 2019-01-24 NOTE — Progress Notes (Signed)
Spoke with Jori Moll (pt's son) regarding plan of care.

## 2019-01-24 NOTE — Progress Notes (Signed)
[place] for Apixaban>>>Heparin Indication: atrial fibrillation  Allergies  Allergen Reactions  . Lactose Intolerance (Gi) Other (See Comments)    Upset stomach    Patient Measurements: Height: 5\' 5"  (165.1 cm) Weight: 147 lb 11.2 oz (67 kg) IBW/kg (Calculated) : 57 Heparin Dosing Weight: 69.9 kg  Vital Signs: Temp: 98.3 F (36.8 C) (03/27 0604) Temp Source: Oral (03/27 0604) BP: 143/44 (03/27 0604) Pulse Rate: 61 (03/27 0604)  Labs: Recent Labs    01/22/19 1338 01/23/19 0324 01/24/19 0500  HGB 10.2* 8.0* 7.5*  HCT 33.0* 24.9* 24.5*  PLT 294 246 240  APTT  --   --  138*  HEPARINUNFRC  --   --  >2.20*  CREATININE 2.85* 2.66* 2.00*    Estimated Creatinine Clearance: 18.5 mL/min (A) (by C-G formula based on SCr of 2 mg/dL (H)).   Medical History: Past Medical History:  Diagnosis Date  . A-fib ([place])   . Anemia   . Arthritis    right knee  . Cataract   . CHF (congestive heart failure) ([place])   . CKD (chronic kidney disease), stage III ([place])   . Dehydration   . Diabetes mellitus   . Gout   . Headache(784.0)   . Hypertension   . UTI (lower urinary tract infection)     Medications:  Scheduled:  . cephALEXin  250 mg Oral Q8H  . colchicine  0.6 mg Oral Daily  . Ferrous Fumarate  1 tablet Oral q morning - 10a  . olopatadine  1 drop Both Eyes BID    Assessment: 85 yo female on chronic Eliquis for afib.  Pharmacy asked to transition to IV heparin in case pacer is needed this admission.  Had dose of Eliquis this AM at 0934.  Expect heparin levels will be elevated due to recent Eliquis, will use aPTT to dose heparin for now  3/27 AM update: aPTT elevated at 138, no issues per RN.   Goal of Therapy:  Heparin level 0.3-0.7 units/ml  APTT 66-102 Monitor platelets by anticoagulation protocol: Yes   Plan:  Dec heparin to 750 units/hr Re-check aPTT in 8 hours  Narda Bonds, PharmD, BCPS Clinical Pharmacist Phone:  [phone]

## 2019-01-24 NOTE — Progress Notes (Signed)
   01/24/19 1900  Mobility  Level of Assistance Moderate assist, patient does 50-74%  Assistive Device Front wheel walker  Minutes Ambulated 10 minutes  Distance Ambulated (ft) 50 ft  Mobility Response Tolerated well

## 2019-01-24 NOTE — Progress Notes (Signed)
Progress Note  Patient Name: Courtney Bradford Date of Encounter: 01/24/2019  Primary Cardiologist: Jordan/Taylor   Subjective   No cardiac complaints   Inpatient Medications    Scheduled Meds: . cephALEXin  250 mg Oral Q8H  . colchicine  0.3 mg Oral Daily  . Ferrous Fumarate  1 tablet Oral q morning - 10a  . olopatadine  1 drop Both Eyes BID   Continuous Infusions: . heparin 750 Units/hr (01/24/19 0625)   PRN Meds: HYDROcodone-acetaminophen   Vital Signs    Vitals:   01/24/19 0158 01/24/19 0523 01/24/19 0604 01/24/19 0801  BP: (!) 150/68  (!) 143/44 (!) 160/57  Pulse: 72  61 60  Resp: 20  20 17   Temp: 98 F (36.7 C)  98.3 F (36.8 C) (!) 97.5 F (36.4 C)  TempSrc: Oral  Oral Oral  SpO2: 100%  100% 100%  Weight:  67 kg    Height:        Intake/Output Summary (Last 24 hours) at 01/24/2019 9417 Last data filed at 01/24/2019 0600 Gross per 24 hour  Intake 531.35 ml  Output -  Net 531.35 ml   Last 3 Weights 01/24/2019 01/23/2019 01/22/2019  Weight (lbs) 147 lb 11.2 oz 154 lb 1.6 oz 162 lb  Weight (kg) 66.996 kg 69.9 kg 73.483 kg      Telemetry    NSR rates 70 off beta blocker still with 2:1 block and wenkebach  - Personally Reviewed  ECG    NSR wenkebach 3;2 no acute ST changes  - Personally Reviewed  Physical Exam  Elderly black female  GEN: No acute distress.   Neck: No JVD Cardiac: RRR, no murmurs, rubs, or gallops.  Respiratory: Clear to auscultation bilaterally. GI: Soft, nontender, non-distended  MS: No edema; No deformity. Neuro:  Nonfocal  Psych: Normal affect  Plus one LE edema Post amputation of left index finger   Labs    Chemistry Recent Labs  Lab 01/22/19 1338 01/23/19 0324 01/24/19 0500  NA 135 140 142  K 5.0 4.6 4.1  CL 103 110 112*  CO2 19* 20* 22  GLUCOSE 176* 80 85  BUN 86* 76* 50*  CREATININE 2.85* 2.66* 2.00*  CALCIUM 9.6 8.8* 9.4  PROT  --  6.0* 5.9*  ALBUMIN  --  2.7* 2.8*  AST  --  20 16  ALT  --  8 9   ALKPHOS  --  47 48  BILITOT  --  1.3* 1.0  GFRNONAA 14* 16* 22*  GFRAA 17* 18* 26*  ANIONGAP 13 10 8      Hematology Recent Labs  Lab 01/22/19 1338 01/23/19 0324 01/24/19 0500  WBC 8.6 5.4 5.0  RBC 3.64* 2.79* 2.73*  2.73*  HGB 10.2* 8.0* 7.5*  HCT 33.0* 24.9* 24.5*  MCV 90.7 89.2 89.7  MCH 28.0 28.7 27.5  MCHC 30.9 32.1 30.6  RDW 13.4 13.5 13.5  PLT 294 246 240    Cardiac EnzymesNo results for input(s): TROPONINI in the last 168 hours. No results for input(s): TROPIPOC in the last 168 hours.   BNPNo results for input(s): BNP, PROBNP in the last 168 hours.   DDimer No results for input(s): DDIMER in the last 168 hours.   Radiology    Dg Finger Index Left  Result Date: 01/22/2019 CLINICAL DATA:  Diabetes. Necrotic left index finger for several months. EXAM: LEFT INDEX FINGER 2+V COMPARISON:  01/18/2019 FINDINGS: Diffuse soft tissue swelling overlying the distal second ray identified. There are new  foci of gas there is a new focus of soft tissue gas identified. Progressive there is complete osteolysis of the distal phalanx with progressive osteolysis distal aspect of the middle phalanx. IMPRESSION: 1. Complete osteolysis of the distal phalanx with progressive osteolysis of the distal aspect of the middle phalanx. 2. New soft tissue gas identified within the soft tissues of the distal ray. Electronically Signed   By: Kerby Moors M.D.   On: 01/22/2019 14:09    Cardiac Studies   TTE 01/23/19 EF 50-55% no significant valve disease   Patient Profile     85 y.o. female admitted with infected left index finger post amputation with bradycardia post op  Assessment & Plan    AV Block:  Chronic dates back to 2012. Asymptomatic. She has PAF as well Was on beta blocker On admission. This has been stopped as even when she was in flutter rates only in 70's. Given Chronicity and asymptomatic state and recent finger infection on antibiotics don't think PPM indicated Would likely do  30 day event monitor off beta blocker and f/u with EP Dr Lovena Le Will ask them to see today Ok to stop heparin and resume eliquis for PAF       For questions or updates, please contact [place] Please consult [email or web address] for contact info under        Signed, Jenkins Rouge, MD  01/24/2019, 9:03 AM

## 2019-01-24 NOTE — Consult Note (Addendum)
Cardiology Consultation:   Patient ID: Courtney Bradford MRN: [id number]; DOB: 02/02/1931  Admit date: 01/22/2019 Date of Consult: 01/24/2019  Primary Care Provider: Iona Beard, MD Primary Cardiologist: No primary care provider on file. Dr. Martinique Primary Electrophysiologist:  Dr. Lovena Le   Patient Profile:   Courtney Bradford is a 85 y.o. female with a hx of HTN, DM, AFib, arthritis, chronic UTI's who is being seen today for the evaluation of heart block at the request of Dr. Johnsie Cancel.  History of Present Illness:   Courtney Bradford back in 2012 was first noted to have asymptomatic 2:1 AVblock observed to be mostly at night felt to be vagal with pain/sleep/apnea, not needed for any intervention.  She was seen by Dr. Lovena Le in consult 2015 for newly diagnosed AFib with rates 130's started on amiodarone that resulted in nocturnal bradycardia and changed to Tikosyn that same admission that failed to maintain SR, EP was consulted.  Dr. Lovena Le could not elicit any kind of symptoms, felt she had asymptomatic tachy-bray recommended stopping tikosyn, and rate controlling with low dose BB and outpatient surveillance.  (I do not find any OP visits in Epic or care everywhere)  She was admitted 01/22/2019, 2/2 infected finger.  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  Recently she developed worsening appetite, eventually becoming anorexic, subjective fever, and then because of worsening appearance of the wound family brought her in for further evaluation.   Ortho was consulted, noting that at [facility] on Saturday she was seen by Dr. Aline Brochure and had an I&D done. He thought this was gouty infiltration but cultures did come back with some rare MSSA.  She was brought same day to the OR for formal I&D, noted inflammatory and destructive  arthropathy and underwent Left index finger amputation through the level of the middle phalanx. Also being treated for AKI on CKD, diastolic CHF, anemia  Cardiology was consulted yesterday 2/2 rate/rhythm abnormalities.  Noting PAFlutter as well as MobitzI with periods of 2:1 AV block, rates 30's-40's.  Her BB stopped, no reported symptoms.  TTE updated noting LVEF 50-55%, LA severely dilated (73mm), mild MR.  LABS 01/18/2019 wound culture + RARE STAPHYLOCOCCUS AUREUS (ERYTHROMYCIN resistant) 01/22/2019 blood Cx neg x2 days (x2)  K+ 4.1 BUN/Creat 50/2.00 (last available for comparison was 2017, 1.85) Mag 1.7 WBC 5.2 H/H 8.9/29.1 Plts 269   In discussion with the patient's son (Courtney Bradford), the patient for a month has had declining functional status, about 2 weeks ago subjective fever, treated with tylenol at home, he wondered about a UTI which she tends to get.  She has had declining mental status as well over the weeks.  He has noted generalized fatigue, weakness, though no episodes of LOC, no reports of syncope or overt lethargy.  Past Medical History:  Diagnosis Date  . A-fib ([place])   . Anemia   . Arthritis    right knee  . Cataract   . CHF (congestive heart failure) ([place])   . CKD (chronic kidney disease), stage III ([place])   . Dehydration   . Diabetes mellitus   . Gout   . Headache(784.0)   . Hypertension   . UTI (lower urinary tract infection)     Past Surgical History:  Procedure Laterality Date  . ABDOMINAL HYSTERECTOMY    . AMPUTATION FINGER  Left 01/22/2019   Procedure: Amputation Left Index Finger First Joint;  Surgeon: Iran Planas, MD;  Location: [facility];  Service: Orthopedics;  Laterality: Left;  . ANTERIOR AND POSTERIOR REPAIR  05/17/2011   Procedure: ANTERIOR (CYSTOCELE) AND POSTERIOR REPAIR (RECTOCELE);  Surgeon: Eli Hose, MD;  Location: [facility];  Service: Gynecology;  Laterality: N/A;  Anterior repair with TVT bladder sling and cystoscopy  . BLADDER SUSPENSION   05/17/2011   Procedure: TRANSVAGINAL TAPE (TVT) PROCEDURE;  Surgeon: Eli Hose, MD;  Location: [facility];  Service: Gynecology;  Laterality: N/A;  . BREAST SURGERY     breast biopsy  . I&D EXTREMITY Left 01/22/2019   Procedure: IRRIGATION AND DEBRIDEMENT OF LEFT INDEX FINGER,;  Surgeon: Iran Planas, MD;  Location: [facility];  Service: Orthopedics;  Laterality: Left;     Home Medications:  Prior to Admission medications   Medication Sig Start Date End Date Taking? Authorizing Provider  acetaminophen (TYLENOL) 500 MG tablet Take 500 mg by mouth every 6 (six) hours as needed for fever.   Yes [provider]  amLODIPine-Valsartan-HCTZ 10-320-25 MG TABS Take 1 tablet by mouth daily.   Yes [provider]  apixaban (ELIQUIS) 5 MG TABS tablet Take 1 tablet (5 mg total) by mouth 2 (two) times daily. 07/31/14  Yes Hosie Poisson, MD  cephALEXin (KEFLEX) 250 MG capsule Take 1 capsule (250 mg total) by mouth 3 (three) times daily. 01/18/19  Yes Virgel Manifold, MD  colchicine 0.6 MG tablet Take 1 tablet (0.6 mg total) by mouth daily. 03/05/16  Yes Hollice Gong, Mir Mohammed, MD  ferrous fumarate (HEMOCYTE - 106 MG FE) 325 (106 FE) MG TABS tablet Take 1 tablet by mouth every morning.    Yes [provider]  furosemide (LASIX) 20 MG tablet Take 20 mg by mouth every Monday, Wednesday, and Friday.    Yes [provider]  HYDROcodone-acetaminophen (NORCO/VICODIN) 5-325 MG tablet Take 1-2 tablets by mouth every 6 (six) hours as needed. 01/18/19  Yes Virgel Manifold, MD  insulin regular (HUMULIN R) 100 units/mL injection Inject 10 Units into the skin every morning.     Yes [provider]  metoprolol tartrate (LOPRESSOR) 25 MG tablet Take 12.5 mg by mouth daily.   Yes [provider]    Inpatient Medications: Scheduled Meds: . apixaban  2.5 mg Oral BID  . cephALEXin  250 mg Oral Q8H  . colchicine  0.3 mg Oral Daily  . Ferrous Fumarate  1 tablet Oral q morning -  10a  . olopatadine  1 drop Both Eyes BID   Continuous Infusions:  PRN Meds: HYDROcodone-acetaminophen  Allergies:    Allergies  Allergen Reactions  . Lactose Intolerance (Gi) Other (See Comments)    Upset stomach    Social History:   Social History   Socioeconomic History  . Marital status: Single    Spouse name: Not on file  . Number of children: Not on file  . Years of education: Not on file  . Highest education level: Not on file  Occupational History  . Not on file  Social Needs  . Financial resource strain: Not on file  . Food insecurity:    Worry: Not on file    Inability: Not on file  . Transportation needs:    Medical: Not on file    Non-medical: Not on file  Tobacco Use  . Smoking status: Never Smoker  . Smokeless tobacco: Never Used  Substance and Sexual Activity  . Alcohol  use: No  . Drug use: No  . Sexual activity: Never    Birth control/protection: Abstinence  Lifestyle  . Physical activity:    Days per week: Not on file    Minutes per session: Not on file  . Stress: Not on file  Relationships  . Social connections:    Talks on phone: Not on file    Gets together: Not on file    Attends religious service: Not on file    Active member of club or organization: Not on file    Attends meetings of clubs or organizations: Not on file    Relationship status: Not on file  . Intimate partner violence:    Fear of current or ex partner: Not on file    Emotionally abused: Not on file    Physically abused: Not on file    Forced sexual activity: Not on file  Other Topics Concern  . Not on file  Social History Narrative  . Not on file    Family History:   Unable to obtain from patient, she does not think there is cardiac problems in her history  ROS:  Please see the history of present illness.  All other ROS reviewed and negative.     Physical Exam/Data:   Vitals:   01/24/19 0523 01/24/19 0604 01/24/19 0801 01/24/19 1113  BP:  (!) 143/44 (!)  160/57 (!) 133/59  Pulse:  61 60 71  Resp:  20 17 20   Temp:  98.3 F (36.8 C) (!) 97.5 F (36.4 C) 98.1 F (36.7 C)  TempSrc:  Oral Oral Oral  SpO2:  100% 100% 100%  Weight: 67 kg     Height:        Intake/Output Summary (Last 24 hours) at 01/24/2019 1159 Last data filed at 01/24/2019 1000 Gross per 24 hour  Intake 891.35 ml  Output -  Net 891.35 ml   Last 3 Weights 01/24/2019 01/23/2019 01/22/2019  Weight (lbs) 147 lb 11.2 oz 154 lb 1.6 oz 162 lb  Weight (kg) 66.996 kg 69.9 kg 73.483 kg     Body mass index is 24.58 kg/m.  General:  Well nourished, well developed, in no acute distress HEENT: normal Lymph: no adenopathy Neck: no JVD Endocrine:  No thryomegaly Vascular: No carotid bruits Cardiac:   RRR; occasional extrasystoles, no murmurs, gallops or rubs Lungs:  clear to auscultation bilaterally, no wheezing, rhonchi or rales  Abd: soft, nontender Ext: no edema Musculoskeletal:  Age appropriate atrophy, L index finger dressed Skin: warm and dry  Neuro:  AAO x2, she is oriented to year, knows she is in the hospital, not to situation Psych:  Normal affect, very pleasent   EKG:  The EKG was personally reviewed and demonstrates:    SR, Mobitz I,  48bpm.  SR rate is 64  Telemetry:  Telemetry was personally reviewed and demonstrates:   SR, HR generally 50's, she has periods of Mobitz I with brief 2:1 conduction, at other times, it seems blocked PACs  Relevant CV Studies:  01/23/2019: TTE IMPRESSIONS  1. The left ventricle has low normal systolic function, with an ejection fraction of 50-55%. The cavity size was normal. Left ventricular diastolic Doppler parameters are consistent with pseudonormalization. No evidence of left ventricular regional wall  motion abnormalities.  2. The right ventricle has normal systolic function. The cavity was normal. There is no increase in right ventricular wall thickness.  3. Left atrial size was severely dilated.  4. The aortic valve is  tricuspid Mild thickening of the aortic valve Mild calcification of the aortic valve. Aortic valve regurgitation is mild by color flow Doppler.  5. The aortic root and ascending aorta are normal in size and structure.  6. There is left bowing of the interatrial septum, suggestive of elevated right atrial pressure.  FINDINGS  Left Ventricle: The left ventricle has low normal systolic function, with an ejection fraction of 50-55%. The cavity size was normal. There is no increase in left ventricular wall thickness. Left ventricular diastolic Doppler parameters are consistent  with pseudonormalization. No evidence of left ventricular regional wall motion abnormalities.. Right Ventricle: The right ventricle has normal systolic function. The cavity was normal. There is no increase in right ventricular wall thickness. Left Atrium: left atrial size was severely dilated Right Atrium: right atrial size was normal in size. Right atrial pressure is estimated at 3 mmHg. Interatrial Septum: No atrial level shunt detected by color flow Doppler. There is left bowing of the interatrial septum, suggestive of elevated right atrial pressure. Pericardium: There is no evidence of pericardial effusion. Mitral Valve: The mitral valve is normal in structure. Mitral valve regurgitation is mild by color flow Doppler. Tricuspid Valve: The tricuspid valve is normal in structure. Tricuspid valve regurgitation is mild by color flow Doppler. Aortic Valve: The aortic valve is tricuspid Mild thickening of the aortic valve Mild calcification of the aortic valve. Aortic valve regurgitation is mild by color flow Doppler. There is no evidence of aortic valve stenosis. Pulmonic Valve: The pulmonic valve was grossly normal. Pulmonic valve regurgitation is not visualized by color flow Doppler. Aorta: The aortic root and ascending aorta are normal in size and structure. Venous: The inferior vena cava is normal in size with greater than 50%  respiratory variability.     Laboratory Data:  Chemistry Recent Labs  Lab 01/22/19 1338 01/23/19 0324 01/24/19 0500  NA 135 140 142  K 5.0 4.6 4.1  CL 103 110 112*  CO2 19* 20* 22  GLUCOSE 176* 80 85  BUN 86* 76* 50*  CREATININE 2.85* 2.66* 2.00*  CALCIUM 9.6 8.8* 9.4  GFRNONAA 14* 16* 22*  GFRAA 17* 18* 26*  ANIONGAP 13 10 8     Recent Labs  Lab 01/23/19 0324 01/24/19 0500  PROT 6.0* 5.9*  ALBUMIN 2.7* 2.8*  AST 20 16  ALT 8 9  ALKPHOS 47 48  BILITOT 1.3* 1.0   Hematology Recent Labs  Lab 01/22/19 1338 01/23/19 0324 01/24/19 0500  WBC 8.6 5.4 5.0  RBC 3.64* 2.79* 2.73*  2.73*  HGB 10.2* 8.0* 7.5*  HCT 33.0* 24.9* 24.5*  MCV 90.7 89.2 89.7  MCH 28.0 28.7 27.5  MCHC 30.9 32.1 30.6  RDW 13.4 13.5 13.5  PLT 294 246 240   Cardiac EnzymesNo results for input(s): TROPONINI in the last 168 hours. No results for input(s): TROPIPOC in the last 168 hours.  BNPNo results for input(s): BNP, PROBNP in the last 168 hours.  DDimer No results for input(s): DDIMER in the last 168 hours.  Radiology/Studies:  Dg Finger Index Left  Result Date: 01/22/2019 CLINICAL DATA:  Diabetes. Necrotic left index finger for several months. EXAM: LEFT INDEX FINGER 2+V COMPARISON:  01/18/2019 FINDINGS: Diffuse soft tissue swelling overlying the distal second ray identified. There are new foci of gas there is a new focus of soft tissue gas identified. Progressive there is complete osteolysis of the distal phalanx with progressive osteolysis distal aspect of the middle phalanx. IMPRESSION: 1. Complete osteolysis of the distal  phalanx with progressive osteolysis of the distal aspect of the middle phalanx. 2. New soft tissue gas identified within the soft tissues of the distal ray. Electronically Signed   By: Kerby Moors M.D.   On: 01/22/2019 14:09    Assessment and Plan:   1. Longstanding asymptomatic Mobitz I, associated with periods of 2:1 AVblock     Dates back to 2012      Historically has been described as nocturnal though she has it here during waking hours as well  I am unable to elicit from the patient or her son clear symptoms of bradycardia.   She has had fatigue, weakness, worsening MS, and functional decline over the last month, however she has also had fever it seems on/off, and is currently being treated for staph infection of her finger, and now s/p partial amputation, which may explain her presentation as well.  Agree, without clear symptoms from her bradycardia, which I did not note rates <40's, and current staph infection, would not persure at this time pacing.  Dr. Lovena Le will see, will review the case and make final recommendations   2. PAFib/flutter (Atypical)     None seen here     CHA2DS2Vasc is 5, on Eliquis out patient     A/c held for OR      With current renal function agree with low dose Eliquis started today       For questions or updates, please contact [place] Please consult [email or web address] for contact info under     Signed, Baldwin Jamaica, PA-C  01/24/2019 11:59 AM  EP attending  Patient seen and examined.  Agree with findings as noted above.  The patient is a very pleasant 85 year old woman who I saw approximately 5 years ago for similar problems who was admitted to the hospital with infection of her finger status post amputation.  She has maintained sinus rhythm until later today and has had periods of AV block with AV Wenke block as well as brief episodes of 2-1 heart block which appear to be asymptomatic.  She has not had syncope.  Recently she has not had rapid atrial fibrillation.  Review of her EKG demonstrates sinus rhythm with mostly 3-2 heart block as well as a narrow QRS. On tele she is currently in well controlled coarse atrial fib.The patient is no longer been taking AV nodal blocking drugs.  At the present time, I think a period of watchful waiting is indicated.  Avoidance of AV nodal blocking drugs is also  indicated.  If she were to develop atrial fibrillation with a rapid ventricular response, then pacemaker insertion would be required.  Hopefully this will not happen anytime soon as she is getting over an infection of her finger.  Cristopher Peru, MD

## 2019-01-25 DIAGNOSIS — I441 Atrioventricular block, second degree: Secondary | ICD-10-CM

## 2019-01-25 DIAGNOSIS — N390 Urinary tract infection, site not specified: Secondary | ICD-10-CM

## 2019-01-25 LAB — COMPREHENSIVE METABOLIC PANEL
ALT: 9 U/L (ref 0–44)
AST: 19 U/L (ref 15–41)
Albumin: 2.9 g/dL — ABNORMAL LOW (ref 3.5–5.0)
Alkaline Phosphatase: 63 U/L (ref 38–126)
Anion gap: 10 (ref 5–15)
BUN: 46 mg/dL — ABNORMAL HIGH (ref 8–23)
CO2: 21 mmol/L — ABNORMAL LOW (ref 22–32)
Calcium: 9.4 mg/dL (ref 8.9–10.3)
Chloride: 111 mmol/L (ref 98–111)
Creatinine, Ser: 2.07 mg/dL — ABNORMAL HIGH (ref 0.44–1.00)
GFR calc Af Amer: 25 mL/min — ABNORMAL LOW (ref >60)
GFR calc non Af Amer: 21 mL/min — ABNORMAL LOW (ref >60)
GLUCOSE: 112 mg/dL — AB (ref 70–99)
Potassium: 4.4 mmol/L (ref 3.5–5.1)
Sodium: 142 mmol/L (ref 135–145)
Total Bilirubin: 0.6 mg/dL (ref 0.3–1.2)
Total Protein: 6.7 g/dL (ref 6.5–8.1)

## 2019-01-25 LAB — CBC WITH DIFFERENTIAL/PLATELET
Abs Immature Granulocytes: 0.08 10*3/uL — ABNORMAL HIGH (ref 0.00–0.07)
Basophils Absolute: 0 10*3/uL (ref 0.0–0.1)
Basophils Relative: 1 %
Eosinophils Absolute: 0.2 10*3/uL (ref 0.0–0.5)
Eosinophils Relative: 3 %
HCT: 28.7 % — ABNORMAL LOW (ref 36.0–46.0)
Hemoglobin: 8.7 g/dL — ABNORMAL LOW (ref 12.0–15.0)
Immature Granulocytes: 1 %
LYMPHS ABS: 2.9 10*3/uL (ref 0.7–4.0)
Lymphocytes Relative: 41 %
MCH: 27.4 pg (ref 26.0–34.0)
MCHC: 30.3 g/dL (ref 30.0–36.0)
MCV: 90.5 fL (ref 80.0–100.0)
Monocytes Absolute: 0.5 10*3/uL (ref 0.1–1.0)
Monocytes Relative: 7 %
NEUTROS ABS: 3.3 10*3/uL (ref 1.7–7.7)
Neutrophils Relative %: 47 %
Platelets: 278 10*3/uL (ref 150–400)
RBC: 3.17 MIL/uL — ABNORMAL LOW (ref 3.87–5.11)
RDW: 13.5 % (ref 11.5–15.5)
WBC: 7 10*3/uL (ref 4.0–10.5)
nRBC: 0 % (ref 0.0–0.2)

## 2019-01-25 LAB — GLUCOSE, CAPILLARY
GLUCOSE-CAPILLARY: 102 mg/dL — AB (ref 70–99)
Glucose-Capillary: 150 mg/dL — ABNORMAL HIGH (ref 70–99)

## 2019-01-25 LAB — PHOSPHORUS: Phosphorus: 3 mg/dL (ref 2.5–4.6)

## 2019-01-25 LAB — MAGNESIUM: Magnesium: 1.8 mg/dL (ref 1.7–2.4)

## 2019-01-25 MED ORDER — AMLODIPINE BESYLATE 2.5 MG PO TABS
2.5000 mg | ORAL_TABLET | Freq: Every day | ORAL | Status: DC
  Administered 2019-01-25: 2.5 mg via ORAL
  Filled 2019-01-25: qty 1

## 2019-01-25 MED ORDER — OLOPATADINE HCL 0.1 % OP SOLN: 1.0000 [drp] | Freq: Two times a day (BID) | OPHTHALMIC | 12 refills | Status: AC

## 2019-01-25 MED ORDER — FUROSEMIDE 20 MG PO TABS: 20.0000 mg | ORAL_TABLET | ORAL | Status: DC

## 2019-01-25 MED ORDER — COLCHICINE 0.6 MG PO TABS: 0.3000 mg | ORAL_TABLET | Freq: Every day | ORAL | 0 refills | Status: DC

## 2019-01-25 MED ORDER — AMLODIPINE BESYLATE 2.5 MG PO TABS: 2.5000 mg | ORAL_TABLET | Freq: Every day | ORAL | 0 refills | Status: DC

## 2019-01-25 MED ORDER — APIXABAN 2.5 MG PO TABS: 2.5000 mg | ORAL_TABLET | Freq: Two times a day (BID) | ORAL | 0 refills | Status: DC

## 2019-01-25 NOTE — Progress Notes (Addendum)
Progress Note  Patient Name: Courtney Bradford Date of Encounter: 01/25/2019  Primary Cardiologist: Dr. Martinique Primary Electrophysiologist:  Dr. Lovena Le  Subjective   She is feeling well, denies dizziness or palpitations. No pain. Walked to the bathroom so far.   Inpatient Medications    Scheduled Meds: . apixaban  2.5 mg Oral BID  . cephALEXin  250 mg Oral Q8H  . colchicine  0.3 mg Oral Daily  . Ferrous Fumarate  1 tablet Oral q morning - 10a  . olopatadine  1 drop Both Eyes BID   Continuous Infusions:  PRN Meds: HYDROcodone-acetaminophen   Vital Signs    Vitals:   01/24/19 1113 01/24/19 1624 01/24/19 2003 01/25/19 0357  BP: (!) 133/59 (!) 129/58 (!) 132/59 (!) 167/78  Pulse: 71 75 79 71  Resp: 20 16 18 16   Temp: 98.1 F (36.7 C) 98.2 F (36.8 C) 99.1 F (37.3 C) 97.9 F (36.6 C)  TempSrc: Oral Oral Oral Oral  SpO2: 100% 99% 98% 99%  Weight:    69.3 kg  Height:        Intake/Output Summary (Last 24 hours) at 01/25/2019 0900 Last data filed at 01/24/2019 2158 Gross per 24 hour  Intake 940 ml  Output -  Net 940 ml   Last 3 Weights 01/25/2019 01/24/2019 01/23/2019  Weight (lbs) 152 lb 12.8 oz 147 lb 11.2 oz 154 lb 1.6 oz  Weight (kg) 69.31 kg 66.996 kg 69.9 kg      Telemetry    Course a-fib/flutter - Personally Reviewed  ECG    No new tracing - Personally Reviewed  Physical Exam  NAD GEN: No acute distress.   Neck: No JVD Cardiac: iRRR, no murmurs, rubs, or gallops.  Respiratory: Clear to auscultation bilaterally. GI: Soft, nontender, non-distended  MS: No edema; warm, Left hand wrapped. Neuro:  Nonfocal  Psych: Normal affect   Labs    Chemistry Recent Labs  Lab 01/23/19 0324 01/24/19 0500 01/25/19 0416  NA 140 142 142  K 4.6 4.1 4.4  CL 110 112* 111  CO2 20* 22 21*  GLUCOSE 80 85 112*  BUN 76* 50* 46*  CREATININE 2.66* 2.00* 2.07*  CALCIUM 8.8* 9.4 9.4  PROT 6.0* 5.9* 6.7  ALBUMIN 2.7* 2.8* 2.9*  AST 20 16 19   ALT 8 9 9   ALKPHOS  47 48 63  BILITOT 1.3* 1.0 0.6  GFRNONAA 16* 22* 21*  GFRAA 18* 26* 25*  ANIONGAP 10 8 10      Hematology Recent Labs  Lab 01/24/19 0500 01/24/19 1247 01/25/19 0416  WBC 5.0 5.2 7.0  RBC 2.73*  2.73* 3.20* 3.17*  HGB 7.5* 8.9* 8.7*  HCT 24.5* 29.1* 28.7*  MCV 89.7 90.9 90.5  MCH 27.5 27.8 27.4  MCHC 30.6 30.6 30.3  RDW 13.5 13.4 13.5  PLT 240 269 278    Cardiac EnzymesNo results for input(s): TROPONINI in the last 168 hours. No results for input(s): TROPIPOC in the last 168 hours.   BNPNo results for input(s): BNP, PROBNP in the last 168 hours.   DDimer No results for input(s): DDIMER in the last 168 hours.   Radiology    No results found.  Cardiac Studies   TTE: 01/23/2019 1. The left ventricle has low normal systolic function, with an ejection fraction of 50-55%. The cavity size was normal. Left ventricular diastolic Doppler parameters are consistent with pseudonormalization. No evidence of left ventricular regional wall  motion abnormalities.  2. The right ventricle has normal systolic  function. The cavity was normal. There is no increase in right ventricular wall thickness.  3. Left atrial size was severely dilated.  4. The aortic valve is tricuspid Mild thickening of the aortic valve Mild calcification of the aortic valve. Aortic valve regurgitation is mild by color flow Doppler.  5. The aortic root and ascending aorta are normal in size and structure.  6. There is left bowing of the interatrial septum, suggestive of elevated right atrial pressure.  Patient Profile     85 y.o. female with a hx of HTN, DM, AFib, arthritis, chronic UTI's who is being seen today for the evaluation of heart block at the request of Dr. Johnsie Cancel.  Assessment & Plan    1. Longstanding asymptomatic Mobitz I, associated with periods of 2:1 AVblock since 2012 2. PAFib/flutter (Atypical)     None seen here     CHA2DS2Vasc is 5, back on Eliquis 3. 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 Hypertension - on no AVN blocking agents, no ACEI or ARB sec to AKI< I would start amlodipine 2.5 mg po daily  5 years ago for similar problems, seen by Dr Lovena Le then and yesterday, she was admitted to the hospital with infection of her finger status post amputation.  She has maintained sinus rhythm until yesterday and has had periods of AV block with AV Wenke block as well as brief episodes of 2-1 heart block which appear to be asymptomatic.  She has not had syncope.  Recently she has not had rapid atrial fibrillation.  On tele she is currently in well controlled coarse atrial fib/atrial flutter with ventricular rates around 60, there was an apisode of faster ventricular rate at 4 am - 120 BPM.  The patient is no longer been taking AV nodal blocking drugs.  At the present time, I think a period of watchful waiting is indicated.  Avoidance of AV nodal blocking drugs is also indicated.  If she were to develop atrial fibrillation with a rapid ventricular response, then pacemaker insertion would be required. Hopefully this will not happen anytime soon as she is getting over an infection of her finger. We will continue to monitor.   For questions or updates, please contact [facility] Please consult [email or web address] for contact info under     Signed, Ena Dawley, MD  01/25/2019, 9:00 AM

## 2019-01-25 NOTE — Discharge Summary (Addendum)
Physician Discharge Summary  Courtney Bradford:[id number] DOB: April 22, 1931 DOA: 01/22/2019  PCP: Iona Beard, MD  Admit date: 01/22/2019 Discharge date: 01/25/2019  Admitted From: Home Disposition: Home with Home Health PT,RN,Aide  Recommendations for Outpatient Follow-up:  1. Follow up with PCP in 1-2 weeks 2. Follow up with Medical Cardiology within 1 week 3. Follow up with EP Cardiology within 1 week 4. Follow up with Hand Surgery Dr. Caralyn Guile within 1 week 5. Will need 30 Day Event Monitoring per Cardiology 6. Please obtain CMP/CBC, Mag, Phos in one week 7. Please follow up on the following pending results:  Home Health: Yes  Equipment/Devices: Conservation officer, nature with 5" Wheels  Discharge Condition: Stable CODE STATUS: FULL CODE Diet recommendation: Heart Healthy Carb Modified Diet   Brief/Interim Summary: The patient is an 85 year old African-American female with a past medical history significant for but not limited to known gouty arthritis on daily colchicine, chronic diastolic CHF with an EF of 60 to 65%, hypertension, paroxysmal atrial fibrillation currently anticoagulated as well as sick sinus syndrome, diabetes mellitus type 2, CKD stage III as well as other comorbidities who presented to the emergency room for chief complaint of hand pain and confusion.  Recently she was evaluated by Dr. [facility] index finger as had been draining and increased pain on 01/17/19.  She underwent partial excision under digital block and tophus removed and she was sent home on p.o. Keflex as cultures at that time grew out Staphylococcus aureus which is only resistant to erythromycin.  Her next few days she experienced fever and decreased appetite and son administered Tylenol daily.  He has not been eating or drinking well for last week and at baseline lives alone with a CNA coming to her house.    Son noticed a change in her and brought her to the emergency room and she was  found to have an infected left finger and she was taken straight to the operating room.  Postoperatively she did well however she became bradycardic and her metoprolol stopped.  Cardiology was consulted and they recommended changing the patient's apixaban to heparin drip and will have EP Evaluate but feel that since she is asymptomatic will recommend continuing monitor with a 30 Day Monitor and have changed Heparin gtt back to Eliquis.  Case was also discussed with Infectious Diseases who recommended since that she had a amputation to change antibiotics to p.o.   EP evaluated and recommended to do watchful waiting indicating off of AV nodal blocking agents and they feel that if she does develop atrial fibrillation with rapid ventricular response and the pacemaker insertion would be required but son is refusing pacer at this time. Hospitalization had been complicated by Anemia and dropping Hemoglobin. FOBT has been ordered repeat hemoglobins have remained stable.  This morning she did have a brief episode of atrial fib/flutter with ventricular rates in the 120s but this was transient.  Cardiology still feels that watchful waiting is indicated and they recommend adding amlodipine and avoiding beta-blockers at this time.  PT recommended 24-hour supervision son is elected to take the patient home with home health services.  Patient was de-medically stable for discharge and medications have been adjusted she will follow-up with cardiology, EP cardiology, PCP, as well as hand surgery in the outpatient setting within 1 to 2 weeks.  Discharge Diagnoses:  Principal Problem:   Open wound of finger, infected Active Problems:   DYSPNEA   Acute on chronic diastolic heart  failure (HCC)   Atrial fibrillation, chronic (HCC)   Diabetes mellitus type 2, controlled ([place])   Acute renal failure superimposed on stage 3 chronic kidney disease (HCC)   Gout   Lower urinary tract infectious disease   Metabolic acidosis    Sepsis ([place])   Mobitz type 2 second degree atrioventricular block  Left Index Finger Wound s/p left index finger debridement of the subcutaneous tissue and bone associated with inflammatory destructive arthropathy as well as 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 -Per Admitting Physician appeared Infected -X-Ray of Hand showed Complete osteolysis of the distal phalanx with progressive osteolysis of the distal aspect of the middle phalanx. There was also New soft tissue gas identified within the soft tissues of the distal ray. -Hand Surgery Consulted and took the patient to the OR yesterday and she had Surgical Intervention -Blood Cx's obtained prior to Surgery and showed NGTD <24 hours -Unfortunately aerobic/anaerobic culture of the finger was never sent -ESR was 89 and CRP was 3.7 last week and will repeat in the a.m. -Asked weeks finger culture grew out Staph aureus which was only resistant to erythromycin -Patient was placed on broad-spectrum antibiotics hospitalization with IV Vancomycin and IV Zosyn -Discussed the case with Dr. Megan Salon who recommends changing back to p.o. Keflex and continuing antibiotics until the patient at least follows up with Dr. Apolonio Schneiders in the outpatient setting; will need to follow-up with Dr. Caralyn Guile within 1 week -Her physiology was not consistent with severe sepsis so Admitting Physician discontinued lactic acid blood draws given she also has AKI, as we know she has an infection localized to her finger we will manage empirically as above -Currently Afebrile and has no Leukocytosis (7.0)  Dyspnea, improved  -Improved -On Room Air -Continue to Monitor and if Necessary obtain CXR but she did not need it   Acute on Chronic Diastolic heart failure ([place]) -Patient paradoxically has worsening creatinine but had JVD on exam and mild lower extremity pitting edema yesterday -Check morning albumin and would  probably hold amlodipine which can cause lower extremity edema -BNP not obtained this AM and will obtain one in AM -Admitting Physician held her Lasix as well as her combination thiazide-ARB medication for now and will continue to Hold given AKI for now -Gentle IVF Hydration with NS at 50 mL/hr now stopped. -Strict I's/O's and Daily Weights; Weight is down 10 lbs and patient is +2.316 Liters since admission  -Cardiology Repeating an ECHOCardiogram and showed LV has Low Normal Systolic Fxn with an EF of 48-27% and LV Diastolic Parameters were consistent with Pseudonormalization   Aortic Valve Disease -Has a Hx of Moderate Aortic Regurgitation  -Cardiology ordering ECHO to evaluate and showed The aortic valve is tricuspid Mild thickening of the aortic valve Mild calcification of the aortic valve. Aortic valve regurgitation is mild by color flow Doppler -Follow up with Cardiology in the outpatient setting   Paroxysmal Atrial Fibrillation, currently in Sinus Brady to Sinus Rhythm -CHA2DS2-VASc was >4 and is on chronic Eliquis -Stopped Metoprolol 12.5 mg due to Bradycardia -C/w Telemetry -Changed Eliquis to IV Heparin given possible Pacer but son wants EP input first; IV Heparin changed back to Eliquis given; Eliquis dose reduced due Age and Renal Fxn -Cardiology evaluation as Dr. Johnsie Cancel feels that given her chronicity and asymptomatic state with her recent finger infection he does not feel a permanent pacemaker is indicated at this time and would like to do a  30-day event monitor off of beta-blocker follow-up with EP -EP evaluated and recommending Watchful Waiting and pacer if She has Rapid A Fib but per Son, Patient's wishes were for no Pacer  -Repeat EKG as an outpatient with Cardiology   Diabetes Mellitus Type 2, controlled ([place]) -On a Heart Healthy / Carb Modified Diet now -CBG's ranging from 72-150 -If Necessary will place on Sensitive Novolog SSI AC -Will Discontinue Home Insulin at D/C  and have PCP to reinitiate if necessary   Lactic Acidosis -LA was 2.8 -IVF as below now stopped   AKI on CKD Stage 3, improved  -Combination of sepsis in addition to AKI from poor po intake -BUN/Cr went from 53/2.26 (on 01/18/2019) -> 86/2.85 -> 76/2.66 -> 50/2.00 -> 46/2.07 -Received Gentle IV fluid 50 mL/hr x 10 hours and now stopped  -Avoid Nephrotoxic Medications if possible and Contrast Dyes -Continue to Monitor and Trend Renal Function -Repeat CMP as an outpatient  -Have stopped her Amlodipine-Valsartan-HCTZ combination and have only placed on Amlodipine 2.5 mg po -Defer to Cardiology to re-initate Furosemide in the outpatient setting   Sick Sinus Syndrome/AV Block -Jeannett Senior has a Hx of A Fib/Flutter and yesterday became extremely Bradycardic and Personal review of Stat EKG done Showed Mobitz Type 1 Block ([place]) -Stopped Metoprolol 12.5 mg po Daily -C/w Telemetry -Cardiology consulted for further evaluation and recommending continuing Telemetry and asking for EP's input this AM -Per Cardiology yesterday Hold Eliquis for PAF and start Heparin gtt incase Pacer is needed but now recommending changing back to Eliquis as Dr. Johnsie Cancel feels that given the Chronicity and Asymptomatic state along with finger infection that PPM is not currently indicated   -Per my discussion with the Son he does not want Pacer placed but want's EP's Input first  -EP Dr. Lovena Le evaulated and reviewing the case Appeared of watchful waiting is indicated and avoidance of any AV nodal blocking agent is also indicated at this time. -She will need close cardiology follow-up and if she redeveloped atrial fibrillation with rapid ventricular response then pacemaker would be required however son states the patient's wishes were for not to have a pacer  Gout -Resumed Colchicine 0.6 mg po Daily but reduced dose as CrCl was <20 and will continue at discharge  Hyperbilirubinemia -Patient's T Bili yesterday AM was  1.3 and repeat this AM is 0.6 -Likely Reactive -Continue to Monitor and Trend  -Repeat CMP as an outpatient   Normocytic Anemia/Anemia of Chronic Kidney Disease -Patient's Hb/Hct went from 10.3/33.5 -> 10.2/33.0 -> 8.0/24.9 -> 7.5/24.5 -> 8.9/29.1 -> 8.7/28.7 -Likely Postoperative and Dilutional Drop -C/w Ferrous Fumarate 106 mg Elemental Iron Daily  -Check Anemia Panel and FOBT -Anemia Panel showed iron level of 35, U IBC of 118, TIBC 153, saturation ratios of 23%, ferritin of 387, folate level 8.1, and vitamin B12 level of 202 -FOBT ordered and pending and not done -Transfuse if HB <7.0 -Continue to Monitor for S/Sx of Bleeding now that she is back on [facility] with Eliquis  -Repeat CBC as an outpatient   Cataracts -Continue with Olopatadine 1 drop in both eyes twice daily  Discharge Instructions Discharge Instructions    ([place]) Call MD:  Anytime you have any of the following symptoms: 1) 3 pound weight gain in 24 hours or 5 pounds in 1 week 2) shortness of breath, with or without a dry hacking cough 3) swelling in the hands, feet or stomach 4) if you have to sleep on extra pillows at night  in order to breathe.   Complete by:  As directed    Call MD for:  difficulty breathing, headache or visual disturbances   Complete by:  As directed    Call MD for:  extreme fatigue   Complete by:  As directed    Call MD for:  hives   Complete by:  As directed    Call MD for:  persistant dizziness or light-headedness   Complete by:  As directed    Call MD for:  persistant nausea and vomiting   Complete by:  As directed    Call MD for:  redness, tenderness, or signs of infection (pain, swelling, redness, odor or green/yellow discharge around incision site)   Complete by:  As directed    Call MD for:  severe uncontrolled pain   Complete by:  As directed    Call MD for:  temperature >100.4   Complete by:  As directed    Diet - low sodium heart healthy   Complete by:  As  directed    Discharge instructions   Complete by:  As directed    You were cared for by a hospitalist during your hospital stay. If you have any questions about your discharge medications or the care you received while you were in the hospital after you are discharged, you can call the unit and ask to speak with the hospitalist on call if the hospitalist that took care of you is not available. Once you are discharged, your primary care physician will handle any further medical issues. Please note that NO REFILLS for any discharge medications will be authorized once you are discharged, as it is imperative that you return to your primary care physician (or establish a relationship with a primary care physician if you do not have one) for your aftercare needs so that they can reassess your need for medications and monitor your lab values.  Follow up with PCP, Medical Cardiology and EP Cardiology. Take all medications as prescribed. If symptoms change or worsen please return to the ED for evaluation   Increase activity slowly   Complete by:  As directed      Allergies as of 01/25/2019      Reactions   Lactose Intolerance (gi) Other (See Comments)   Upset stomach      Medication List    STOP taking these medications   amLODIPine-Valsartan-HCTZ 10-320-25 MG Tabs   HumuLIN R 100 units/mL injection Generic drug:  insulin regular   metoprolol tartrate 25 MG tablet Commonly known as:  LOPRESSOR   Olopatadine HCl 0.2 % Soln Replaced by:  olopatadine 0.1 % ophthalmic solution     TAKE these medications   acetaminophen 500 MG tablet Commonly known as:  TYLENOL Take 500 mg by mouth every 6 (six) hours as needed for fever.   amLODipine 2.5 MG tablet Commonly known as:  NORVASC Take 1 tablet (2.5 mg total) by mouth daily. Start taking on:  January 26, 2019   apixaban 2.5 MG Tabs tablet Commonly known as:  ELIQUIS Take 1 tablet (2.5 mg total) by mouth 2 (two) times daily. What changed:     medication strength  how much to take   cephALEXin 250 MG capsule Commonly known as:  KEFLEX Take 1 capsule (250 mg total) by mouth 3 (three) times daily.   colchicine 0.6 MG tablet Take 0.5 tablets (0.3 mg total) by mouth daily. Start taking on:  January 26, 2019 What changed:  how much to take  ferrous fumarate 325 (106 Fe) MG Tabs tablet Commonly known as:  HEMOCYTE - 106 mg FE Take 1 tablet by mouth every morning.   furosemide 20 MG tablet Commonly known as:  LASIX Take 1 tablet (20 mg total) by mouth every Monday, Wednesday, and Friday. DISCUSS WITH CARDIOLOGY PRIOR to RESUMING Start taking on:  January 27, 2019 What changed:  additional instructions   HYDROcodone-acetaminophen 5-325 MG tablet Commonly known as:  NORCO/VICODIN Take 1-2 tablets by mouth every 6 (six) hours as needed.   olopatadine 0.1 % ophthalmic solution Commonly known as:  PATANOL Place 1 drop into both eyes 2 (two) times daily. Replaces:  Olopatadine HCl 0.2 % Soln            Durable Medical Equipment  (From admission, onward)         Start     Ordered   01/25/19 1108  For home use only DME Walker rolling  [facility])  Once    Question:  Patient needs a walker to treat with the following condition  Answer:  Generalized weakness   01/25/19 1111          Allergies  Allergen Reactions  . Lactose Intolerance ([place]) Other (See Comments)    Upset stomach   Consultations:  Orthopedic Hand Surgery Dr. Iran Planas  Discussed with ID Dr. Michel Bickers  Medical Cardiology Dr. Johnsie Cancel  EP Cardiology  Procedures/Studies: Dg Finger Index Left  Result Date: 01/22/2019 CLINICAL DATA:  Diabetes. Necrotic left index finger for several months. EXAM: LEFT INDEX FINGER 2+V COMPARISON:  01/18/2019 FINDINGS: Diffuse soft tissue swelling overlying the distal second ray identified. There are new foci of gas there is a new focus of soft tissue gas identified. Progressive there is complete osteolysis of  the distal phalanx with progressive osteolysis distal aspect of the middle phalanx. IMPRESSION: 1. Complete osteolysis of the distal phalanx with progressive osteolysis of the distal aspect of the middle phalanx. 2. New soft tissue gas identified within the soft tissues of the distal ray. Electronically Signed   By: Kerby Moors M.D.   On: 01/22/2019 14:09   Dg Finger Index Left  Result Date: 01/18/2019 CLINICAL DATA:  Chronic left index finger infection which began draining yesterday. EXAM: LEFT INDEX FINGER 2+V COMPARISON:  None. FINDINGS: Soft tissues of the index finger are swollen. The distal phalanx is not visualized. There is incomplete osteolysis of the middle phalanx extending from the proximal diaphysis distally. No radiopaque foreign body or soft tissue gas. Chondrocalcinosis about the imaged MCP joints noted. IMPRESSION: Marked soft tissue swelling of the index finger with complete osteolysis of the distal phalanx and partial osteolysis of the middle phalanx consistent with osteomyelitis. Electronically Signed   By: Inge Rise M.D.   On: 01/18/2019 13:34   ECHOCARDIOGRAM 01/23/2019 IMPRESSIONS   1. The left ventricle has low normal systolic function, with an ejection fraction of 50-55%. The cavity size was normal. Left ventricular diastolic Doppler parameters are consistent with pseudonormalization. No evidence of left ventricular regional wall motion abnormalities. 2. The right ventricle has normal systolic function. The cavity was normal. There is no increase in right ventricular wall thickness. 3. Left atrial size was severely dilated. 4. The aortic valve is tricuspid Mild thickening of the aortic valve Mild calcification of the aortic valve. Aortic valve regurgitation is mild by color flow Doppler. 5. The aortic root and ascending aorta are normal in size and structure. 6. There is left bowing of the interatrial septum, suggestive of  elevated right atrial  pressure.  FINDINGS Left Ventricle: The left ventricle has low normal systolic function, with an ejection fraction of 50-55%. The cavity size was normal. There is no increase in left ventricular wall thickness. Left ventricular diastolic Doppler parameters are consistent  with pseudonormalization. No evidence of left ventricular regional wall motion abnormalities.. Right Ventricle: The right ventricle has normal systolic function. The cavity was normal. There is no increase in right ventricular wall thickness. Left Atrium: left atrial size was severely dilated Right Atrium: right atrial size was normal in size. Right atrial pressure is estimated at 3 mmHg. Interatrial Septum: No atrial level shunt detected by color flow Doppler. There is left bowing of the interatrial septum, suggestive of elevated right atrial pressure. Pericardium: There is no evidence of pericardial effusion. Mitral Valve: The mitral valve is normal in structure. Mitral valve regurgitation is mild by color flow Doppler. Tricuspid Valve: The tricuspid valve is normal in structure. Tricuspid valve regurgitation is mild by color flow Doppler. Aortic Valve: The aortic valve is tricuspid Mild thickening of the aortic valve Mild calcification of the aortic valve. Aortic valve regurgitation is mild by color flow Doppler. There is no evidence of aortic valve stenosis. Pulmonic Valve: The pulmonic valve was grossly normal. Pulmonic valve regurgitation is not visualized by color flow Doppler. Aorta: The aortic root and ascending aorta are normal in size and structure. Venous: The inferior vena cava is normal in size with greater than 50% respiratory variability.  LEFT VENTRICLE PLAX 2D LVIDd: 3.80 cm Diastology LVIDs: 3.10 cm LV e' lateral: 6.83 cm/s LV PW: 1.30 cm LV E/e' lateral: 11.6 LV IVS: 1.10 cm LV e' medial: 2.70 cm/s LVOT diam: 2.00 cm LV E/e' medial: 29.3 LV SV: 24  ml LV SV Index: 13.33 LVOT Area: 3.14 cm  RIGHT VENTRICLE RV S prime: 9.83 cm/s TAPSE (M-mode): 2.4 cm RVSP: 39.6 mmHg  LEFT ATRIUM Index RIGHT ATRIUM Index LA diam: 5.20 cm 2.94 cm/m RA Pressure: 3 mmHg LA Vol (A2C): 117.0 ml 66.08 ml/m RA Area: 13.90 cm LA Vol (A4C): 104.8 ml 59.16 ml/m RA Volume: 30.60 ml 17.28 ml/m LA Biplane Vol: 115.0 ml 64.95 ml/m AORTIC VALVE LVOT Vmax: 91.70 cm/s LVOT Vmean: 57.000 cm/s LVOT VTI: 0.239 m AR PHT: 479 msec  AORTA Ao Root diam: 2.90 cm Ao Asc diam: 2.80 cm  MITRAL VALVE TRICUSPID VALVE MV Area (PHT): 4.15 cm TR Peak grad: 36.6 mmHg MV PHT: 53.07 msec TR Vmax: 323.00 cm/s MV Decel Time: 183 msec RVSP: 39.6 mmHg MV E velocity: 79.20 cm/s MV A velocity: 76.40 cm/s SHUNTS MV E/A ratio: 1.04 Systemic VTI: 0.24 m Systemic Diam: 2.00 cm  Subjective: Seen and examined at Bedside was doing well.  Denies chest pain, lightheadedness or dizziness.  No nausea or vomiting.  Felt well and felt a little bit stronger.  No other concerns or complaints at this time is the medically stable to be discharged and follow-up with primary cardiology, EP cardiology, and surgery and PCP in the outpatient setting.  Case was discussed with patient's son and he is understandable agreeable with the plan and she is to be discharged home with home health today.  Discharge Exam: Vitals:   01/25/19 0357 01/25/19 0939  BP: (!) 167/78 (!) 151/62  Pulse: 71 70  Resp: 16   Temp: 97.9 F (36.6 C) 99.6 F (37.6 C)  SpO2: 99% 98%   Vitals:   01/24/19 1624 01/24/19 2003 01/25/19 0357 01/25/19 0939  BP: (!) 129/58 Marland Kitchen)  132/59 (!) 167/78 (!) 151/62  Pulse: 75 79 71 70  Resp: _0 Temp: 98.2 F (36.8 C) 99.1 F (37.3 C) 97.9 F (36.6 C) 99.6 F (37.6 C)  TempSrc: Oral Oral Oral Oral  SpO2: 99%  98% 99% 98%  Weight:   69.3 kg   Height:       General: Pt is alert, awake, not in acute distress Cardiovascular: RRR with rates in the 70's, S1/S2 +, no rubs, no gallops Respiratory: Diminished bilaterally, no wheezing, no rhonchi Abdominal: Soft, NT, Mildly distended, bowel sounds + Extremities: No edema, no cyanosis  The results of significant diagnostics from this hospitalization (including imaging, microbiology, ancillary and laboratory) are listed below for reference.    Microbiology: Recent Results (from the past 240 hour(s))  Aerobic Culture (superficial specimen)     Status: None   Collection Time: 01/18/19  3:53 PM  Result Value Ref Range Status   Specimen Description   Final    FINGER Performed at [facility], [address] [place], [place] [address]    Special Requests   Final    LEFT FINGER Performed at [facility], [address], [place], [place] [address]    Gram Stain   Final    RARE WBC PRESENT, PREDOMINANTLY PMN NO ORGANISMS SEEN Performed at [facility] [address] [place], [place] [address]    Culture RARE STAPHYLOCOCCUS AUREUS  Final   Report Status 01/22/2019 FINAL  Final   Organism ID, Bacteria STAPHYLOCOCCUS AUREUS  Final      Susceptibility   Staphylococcus aureus - MIC*    CIPROFLOXACIN <=0.5 SENSITIVE Sensitive     ERYTHROMYCIN >=8 RESISTANT Resistant     GENTAMICIN <=0.5 SENSITIVE Sensitive     OXACILLIN <=0.25 SENSITIVE Sensitive     TETRACYCLINE <=1 SENSITIVE Sensitive     VANCOMYCIN <=0.5 SENSITIVE Sensitive     TRIMETH/SULFA <=10 SENSITIVE Sensitive     CLINDAMYCIN <=0.25 SENSITIVE Sensitive     RIFAMPIN <=0.5 SENSITIVE Sensitive     Inducible Clindamycin NEGATIVE Sensitive     * RARE STAPHYLOCOCCUS AUREUS  Culture, blood (routine x 2)     Status: None (Preliminary result)   Collection Time: 01/22/19  2:19 PM  Result Value Ref Range Status   Specimen Description BLOOD RIGHT ANTECUBITAL  Final   Special Requests   Final     BOTTLES DRAWN AEROBIC AND ANAEROBIC Blood Culture adequate volume   Culture   Final    NO GROWTH 2 DAYS Performed at [facility], [address] [place], [place] [address]    Report Status PENDING  Incomplete  Culture, blood (routine x 2)     Status: None (Preliminary result)   Collection Time: 01/22/19  2:24 PM  Result Value Ref Range Status   Specimen Description BLOOD RIGHT HAND  Final   Special Requests   Final    BOTTLES DRAWN AEROBIC ONLY Blood Culture results may not be optimal due to an inadequate volume of blood received in culture bottles   Culture   Final    NO GROWTH 2 DAYS Performed at [facility], [place] [address], [place], [place] [address]    Report Status PENDING  Incomplete   Labs: BNP (last 3 results) No results for input(s): BNP in the last 8760 hours. Basic Metabolic Panel: Recent Labs  Lab 01/18/19 1424 01/22/19 1338 01/23/19 0324 01/24/19 0500 01/25/19 0416  NA 141 135 140 142 142  K 3.7 5.0 4.6 4.1 4.4  CL 105 103 110 112* 111  CO2 23 19* 20* 22 21*  GLUCOSE 276* 176* 80 85 112*  BUN 53* 86* 76* 50* 46*  CREATININE 2.26* 2.85* 2.66* 2.00* 2.07*  CALCIUM 9.4 9.6 8.8* 9.4 9.4  MG  --   --  1.9 1.7 1.8  PHOS  --   --   --  3.0 3.0   Liver Function Tests: Recent Labs  Lab 01/23/19 0324 01/24/19 0500 01/25/19 0416  AST _0 ALT _1 ALKPHOS 47 48 63  BILITOT 1.3* 1.0 0.6  PROT 6.0* 5.9* 6.7  ALBUMIN 2.7* 2.8* 2.9*   No results for input(s): LIPASE, AMYLASE in the last 168 hours. No results for input(s): AMMONIA in the last 168 hours. CBC: Recent Labs  Lab 01/18/19 1424 01/22/19 1338 01/23/19 0324 01/24/19 0500 01/24/19 1247 01/25/19 0416  WBC 7.7 8.6 5.4 5.0 5.2 7.0  NEUTROABS 6.0 7.0  --  2.7 3.2 3.3  HGB 10.3* 10.2* 8.0* 7.5* 8.9* 8.7*  HCT 33.5* 33.0* 24.9* 24.5* 29.1* 28.7*  MCV 91.3 90.7 89.2 89.7 90.9 90.5  PLT 235 294 246 240 269 278   Cardiac Enzymes: No results for input(s): CKTOTAL, CKMB,  CKMBINDEX, TROPONINI in the last 168 hours. BNP: Invalid input(s): POCBNP CBG: Recent Labs  Lab 01/24/19 0612 01/24/19 1145 01/24/19 1705 01/24/19 2057 01/25/19 0607  GLUCAP 72 141* 104* 113* 102*   D-Dimer No results for input(s): DDIMER in the last 72 hours. Hgb A1c No results for input(s): HGBA1C in the last 72 hours. Lipid Profile No results for input(s): CHOL, HDL, LDLCALC, TRIG, CHOLHDL, LDLDIRECT in the last 72 hours. Thyroid function studies No results for input(s): TSH, T4TOTAL, T3FREE, THYROIDAB in the last 72 hours.  Invalid input(s): FREET3 Anemia work up Recent Labs    01/24/19 0500  VITAMINB12 202  FOLATE 8.1  FERRITIN 387*  TIBC 153*  IRON 35  RETICCTPCT 1.8   Urinalysis    Component Value Date/Time   COLORURINE YELLOW 02/28/2016 1931   APPEARANCEUR CLOUDY (A) 02/28/2016 1931   LABSPEC 1.013 02/28/2016 1931   PHURINE 5.5 02/28/2016 1931   GLUCOSEU NEGATIVE 02/28/2016 1931   HGBUR TRACE (A) 02/28/2016 1931   BILIRUBINUR NEGATIVE 02/28/2016 1931   KETONESUR NEGATIVE 02/28/2016 1931   PROTEINUR NEGATIVE 02/28/2016 1931   UROBILINOGEN 1.0 07/22/2014 0300   NITRITE NEGATIVE 02/28/2016 1931   LEUKOCYTESUR MODERATE (A) 02/28/2016 1931   Sepsis Labs Invalid input(s): PROCALCITONIN,  WBC,  LACTICIDVEN Microbiology Recent Results (from the past 240 hour(s))  Aerobic Culture (superficial specimen)     Status: None   Collection Time: 01/18/19  3:53 PM  Result Value Ref Range Status   Specimen Description   Final    FINGER Performed at [facility], [address] [place], [place] [address]    Special Requests   Final    LEFT FINGER Performed at [facility], [address] [place], [place] [address]    Gram Stain   Final    RARE WBC PRESENT, PREDOMINANTLY PMN NO ORGANISMS SEEN Performed at [facility], [place] [address] [place], [place] [address]    Culture RARE STAPHYLOCOCCUS AUREUS  Final   Report Status 01/22/2019 FINAL  Final    Organism ID, Bacteria STAPHYLOCOCCUS AUREUS  Final      Susceptibility   Staphylococcus aureus - MIC*    CIPROFLOXACIN <=0.5 SENSITIVE Sensitive     ERYTHROMYCIN >=8 RESISTANT  Resistant     GENTAMICIN <=0.5 SENSITIVE Sensitive     OXACILLIN <=0.25 SENSITIVE Sensitive     TETRACYCLINE <=1 SENSITIVE Sensitive     VANCOMYCIN <=0.5 SENSITIVE Sensitive     TRIMETH/SULFA <=10 SENSITIVE Sensitive     CLINDAMYCIN <=0.25 SENSITIVE Sensitive     RIFAMPIN <=0.5 SENSITIVE Sensitive     Inducible Clindamycin NEGATIVE Sensitive     * RARE STAPHYLOCOCCUS AUREUS  Culture, blood (routine x 2)     Status: None (Preliminary result)   Collection Time: 01/22/19  2:19 PM  Result Value Ref Range Status   Specimen Description BLOOD RIGHT ANTECUBITAL  Final   Special Requests   Final    BOTTLES DRAWN AEROBIC AND ANAEROBIC Blood Culture adequate volume   Culture   Final    NO GROWTH 2 DAYS Performed at [facility], [place] [address], [place], [place] [address]    Report Status PENDING  Incomplete  Culture, blood (routine x 2)     Status: None (Preliminary result)   Collection Time: 01/22/19  2:24 PM  Result Value Ref Range Status   Specimen Description BLOOD RIGHT HAND  Final   Special Requests   Final    BOTTLES DRAWN AEROBIC ONLY Blood Culture results may not be optimal due to an inadequate volume of blood received in culture bottles   Culture   Final    NO GROWTH 2 DAYS Performed at [facility], [place] [address] [place], [place] [address]    Report Status PENDING  Incomplete   Time coordinating discharge: 35 minutes  SIGNED:  Kerney Elbe, DO [facility] 01/25/2019, 11:14 AM Pager is on [place]  If 7PM-7AM, please contact night-coverage [email or web address] Password [email or web address]

## 2019-01-25 NOTE — Discharge Instructions (Signed)

## 2019-01-25 NOTE — Plan of Care (Signed)
Physical Therapy Evaluation Patient Details Name: Courtney Bradford MRN: [id number] DOB: Aug 06, 1931 Today's Date: 01/25/2019   History of Present Illness  Patient presented to the hospital with AKI and a finger wound. Her son works for [employer]. PMH: UTI, gout, DMII, HTN, arthritis   Clinical Impression  Patient was mod independent with bed mobility but required moderate assistance for transfers. She was unable to maintain standing on her first trial. On the second trial she was able to maintain her balance. She required min a for balance for ambulation. Her distance was limited. If she has 24 hour assistance she would benefit from home health. She would otherwise benefit from rehab at a SNF to improve strength. Acute PT will continue to follow to improve strength and safety with mobility.     Follow Up Recommendations Supervision/Assistance - 24 hour;Home health PT;SNF    Equipment Recommendations  Rolling walker with 5" wheels    Recommendations for Other Services Rehab consult     Precautions / Restrictions Precautions Precautions: Fall Restrictions Weight Bearing Restrictions: No      Mobility  Bed Mobility Overal bed mobility: Modified Independent             General bed mobility comments: Able to use the rails and the edge of the bed to sit up and scoot to the edge of the bed.  Transfers Overall transfer level: Needs assistance Equipment used: Rolling walker (2 wheeled) Transfers: Sit to/from Stand Sit to Stand: Mod assist         General transfer comment: Stood 2x with mod a. On the first trial she was unable to maintain her balance. and needed to sit back on the bed. On the second trial with therapy assist she was able to maintain her balance  Ambulation/Gait Ambulation/Gait assistance: Min assist Gait Distance (Feet): 12 Feet Assistive device: Rolling walker (2 wheeled) Gait Pattern/deviations: Decreased stride length;Shuffle     General Gait Details: Min a  for balance with gait   Stairs            Wheelchair Mobility    Modified Rankin (Stroke Patients Only)       Balance Overall balance assessment: Needs assistance Sitting-balance support: No upper extremity supported Sitting balance-Leahy Scale: Good     Standing balance support: Bilateral upper extremity supported Standing balance-Leahy Scale: Poor                               Pertinent Vitals/Pain Pain Assessment: Faces Faces Pain Scale: Hurts little more Pain Location: left hand  Pain Descriptors / Indicators: Aching Pain Intervention(s): Limited activity within patient's tolerance;Monitored during session;Repositioned    Home Living Family/patient expects to be discharged to:: Private residence Living Arrangements: Children Available Help at Discharge: Family Type of Home: House           Additional Comments: Lives with sone who works for [employer]. Per nurse patient has an aid during the day but no-one at this time at night     Prior Function Level of Independence: Needs assistance   Gait / Transfers Assistance Needed: Had an aide during the day; used a single point cane   ADL's / Homemaking Assistance Needed: Aide and son assited         Hand Dominance   Dominant Hand: Right    Extremity/Trunk Assessment   Upper Extremity Assessment Upper Extremity Assessment: Defer to OT evaluation    Lower Extremity Assessment Lower  Extremity Assessment: Generalized weakness    Cervical / Trunk Assessment Cervical / Trunk Assessment: Normal  Communication   Communication: No difficulties  Cognition Arousal/Alertness: Awake/alert Behavior During Therapy: WFL for tasks assessed/performed Overall Cognitive Status: Within Functional Limits for tasks assessed                                 General Comments: Aox3      General Comments      Exercises     Assessment/Plan    PT Assessment Patient needs continued PT  services  PT Problem List Decreased strength;Decreased activity tolerance;Decreased balance;Decreased mobility;Pain       PT Treatment Interventions DME instruction;Gait training;Stair training;Functional mobility training;Therapeutic activities;Therapeutic exercise;Patient/family education    PT Goals (Current goals can be found in the Care Plan section)  Acute Rehab PT Goals Patient Stated Goal: to go home  PT Goal Formulation: With patient Time For Goal Achievement: 02/01/19 Potential to Achieve Goals: Good    Frequency Min 3X/week   Barriers to discharge Decreased caregiver support has sitter during the day    Co-evaluation               AM-PAC PT "6 Clicks" Mobility  Outcome Measure Help needed turning from your back to your side while in a flat bed without using bedrails?: None Help needed moving from lying on your back to sitting on the side of a flat bed without using bedrails?: None Help needed moving to and from a bed to a chair (including a wheelchair)?: A Lot Help needed standing up from a chair using your arms (e.g., wheelchair or bedside chair)?: A Lot Help needed to walk in hospital room?: A Lot Help needed climbing 3-5 steps with a railing? : Total 6 Click Score: 15    End of Session Equipment Utilized During Treatment: Gait belt Activity Tolerance: Patient limited by fatigue Patient left: in chair Nurse Communication: Mobility status PT Visit Diagnosis: Unsteadiness on feet (R26.81);Other abnormalities of gait and mobility (R26.89);Muscle weakness (generalized) (M62.81);Difficulty in walking, not elsewhere classified (R26.2)    Time: 1914-7829 PT Time Calculation (min) (ACUTE ONLY): 24 min   Charges:   PT Evaluation $PT Eval Moderate Complexity: 1 Mod           Carney Living PT DPT  01/25/2019, 10:55 AM

## 2019-01-25 NOTE — Care Management (Addendum)
Spoke w patient's son Ron over the phone. He would very much like [facility]. He states that several other family members are using [facility] and they would be his first preference, [facility] his second. Referral made to [facility]. Ron states that his mother has DME RW WC 3/1 cane and no other DME is needed. [facility] accepted referral for [facility] Tuesday. They will call son, Ron, to set up time.  Ron updated that [facility] will be in contact with him to see patient Tuesday.

## 2019-01-25 NOTE — Progress Notes (Signed)
Call placed to pt's son, Ron, to notify of pt ready for pick-up. He will call when he arrives, and pt will be escorted to his car.   Per Velia,RN pt is ready for pick-up.

## 2019-01-25 NOTE — Progress Notes (Signed)
Patient was dressed and brief changed. Peripheral iv removed,clean dry and intact, pressure and dressing applied.  Belongings in the patients suitcase:box of briefs,clothes, dentures box,lotions. Patient is sitting down in her chair.  Discharge packet in patients room. CCMD called.

## 2019-01-27 ENCOUNTER — Other Ambulatory Visit: Payer: Self-pay | Admitting: Physician Assistant

## 2019-01-27 DIAGNOSIS — R001 Bradycardia, unspecified: Secondary | ICD-10-CM

## 2019-01-27 LAB — CULTURE, BLOOD (ROUTINE X 2)
Culture: NO GROWTH
Culture: NO GROWTH
Special Requests: ADEQUATE

## 2019-01-28 NOTE — Progress Notes (Signed)
Received call from patient's son about completing paperwork for a personal care service. CM informed him that I talked to [facility] and he requested that a letter of medical necessity be faxed to him. Letter faxed as requested. CM informed son that CM does not complete forms for PCS, the PCP usually initiate the process. Attending MD that was following patient is in agreement. Paperwork given to son to take to the PCP office for completion.

## 2019-01-29 ENCOUNTER — Other Ambulatory Visit: Payer: Self-pay | Admitting: Physician Assistant

## 2019-01-29 DIAGNOSIS — I441 Atrioventricular block, second degree: Secondary | ICD-10-CM

## 2019-01-30 ENCOUNTER — Telehealth: Payer: Self-pay | Admitting: Radiology

## 2019-01-30 NOTE — Telephone Encounter (Signed)
[employer] AT monitor was enrolled to be mailed to patient on4/2due to Covid-19. Patient son knows to expect a call from [facility] and he will receive the monitor in 3-5days

## 2019-01-31 DIAGNOSIS — I5033 Acute on chronic diastolic (congestive) heart failure: Secondary | ICD-10-CM | POA: Diagnosis not present

## 2019-01-31 DIAGNOSIS — D631 Anemia in chronic kidney disease: Secondary | ICD-10-CM | POA: Diagnosis not present

## 2019-01-31 DIAGNOSIS — I480 Paroxysmal atrial fibrillation: Secondary | ICD-10-CM | POA: Diagnosis not present

## 2019-01-31 DIAGNOSIS — Z87440 Personal history of urinary (tract) infections: Secondary | ICD-10-CM | POA: Diagnosis not present

## 2019-01-31 DIAGNOSIS — E1136 Type 2 diabetes mellitus with diabetic cataract: Secondary | ICD-10-CM | POA: Diagnosis not present

## 2019-01-31 DIAGNOSIS — I484 Atypical atrial flutter: Secondary | ICD-10-CM | POA: Diagnosis not present

## 2019-01-31 DIAGNOSIS — M1711 Unilateral primary osteoarthritis, right knee: Secondary | ICD-10-CM | POA: Diagnosis not present

## 2019-01-31 DIAGNOSIS — Z9181 History of falling: Secondary | ICD-10-CM | POA: Diagnosis not present

## 2019-01-31 DIAGNOSIS — Z89022 Acquired absence of left finger(s): Secondary | ICD-10-CM | POA: Diagnosis not present

## 2019-01-31 DIAGNOSIS — I358 Other nonrheumatic aortic valve disorders: Secondary | ICD-10-CM | POA: Diagnosis not present

## 2019-01-31 DIAGNOSIS — Z4781 Encounter for orthopedic aftercare following surgical amputation: Secondary | ICD-10-CM | POA: Diagnosis not present

## 2019-01-31 DIAGNOSIS — I495 Sick sinus syndrome: Secondary | ICD-10-CM | POA: Diagnosis not present

## 2019-01-31 DIAGNOSIS — E1122 Type 2 diabetes mellitus with diabetic chronic kidney disease: Secondary | ICD-10-CM | POA: Diagnosis not present

## 2019-01-31 DIAGNOSIS — Z7901 Long term (current) use of anticoagulants: Secondary | ICD-10-CM | POA: Diagnosis not present

## 2019-01-31 DIAGNOSIS — Z794 Long term (current) use of insulin: Secondary | ICD-10-CM | POA: Diagnosis not present

## 2019-01-31 DIAGNOSIS — I130 Hypertensive heart and chronic kidney disease with heart failure and stage 1 through stage 4 chronic kidney disease, or unspecified chronic kidney disease: Secondary | ICD-10-CM | POA: Diagnosis not present

## 2019-01-31 DIAGNOSIS — N183 Chronic kidney disease, stage 3 (moderate): Secondary | ICD-10-CM | POA: Diagnosis not present

## 2019-02-03 DIAGNOSIS — M1711 Unilateral primary osteoarthritis, right knee: Secondary | ICD-10-CM | POA: Diagnosis not present

## 2019-02-03 DIAGNOSIS — I480 Paroxysmal atrial fibrillation: Secondary | ICD-10-CM | POA: Diagnosis not present

## 2019-02-03 DIAGNOSIS — D631 Anemia in chronic kidney disease: Secondary | ICD-10-CM | POA: Diagnosis not present

## 2019-02-03 DIAGNOSIS — Z87440 Personal history of urinary (tract) infections: Secondary | ICD-10-CM | POA: Diagnosis not present

## 2019-02-03 DIAGNOSIS — Z89022 Acquired absence of left finger(s): Secondary | ICD-10-CM | POA: Diagnosis not present

## 2019-02-03 DIAGNOSIS — I495 Sick sinus syndrome: Secondary | ICD-10-CM | POA: Diagnosis not present

## 2019-02-03 DIAGNOSIS — E1122 Type 2 diabetes mellitus with diabetic chronic kidney disease: Secondary | ICD-10-CM | POA: Diagnosis not present

## 2019-02-03 DIAGNOSIS — Z794 Long term (current) use of insulin: Secondary | ICD-10-CM | POA: Diagnosis not present

## 2019-02-03 DIAGNOSIS — Z4781 Encounter for orthopedic aftercare following surgical amputation: Secondary | ICD-10-CM | POA: Diagnosis not present

## 2019-02-03 DIAGNOSIS — I5033 Acute on chronic diastolic (congestive) heart failure: Secondary | ICD-10-CM | POA: Diagnosis not present

## 2019-02-03 DIAGNOSIS — E1136 Type 2 diabetes mellitus with diabetic cataract: Secondary | ICD-10-CM | POA: Diagnosis not present

## 2019-02-03 DIAGNOSIS — I130 Hypertensive heart and chronic kidney disease with heart failure and stage 1 through stage 4 chronic kidney disease, or unspecified chronic kidney disease: Secondary | ICD-10-CM | POA: Diagnosis not present

## 2019-02-03 DIAGNOSIS — Z9181 History of falling: Secondary | ICD-10-CM | POA: Diagnosis not present

## 2019-02-03 DIAGNOSIS — Z7901 Long term (current) use of anticoagulants: Secondary | ICD-10-CM | POA: Diagnosis not present

## 2019-02-03 DIAGNOSIS — I358 Other nonrheumatic aortic valve disorders: Secondary | ICD-10-CM | POA: Diagnosis not present

## 2019-02-03 DIAGNOSIS — I484 Atypical atrial flutter: Secondary | ICD-10-CM | POA: Diagnosis not present

## 2019-02-03 DIAGNOSIS — N183 Chronic kidney disease, stage 3 (moderate): Secondary | ICD-10-CM | POA: Diagnosis not present

## 2019-02-04 DIAGNOSIS — Z7901 Long term (current) use of anticoagulants: Secondary | ICD-10-CM | POA: Diagnosis not present

## 2019-02-04 DIAGNOSIS — Z4781 Encounter for orthopedic aftercare following surgical amputation: Secondary | ICD-10-CM | POA: Diagnosis not present

## 2019-02-04 DIAGNOSIS — Z87440 Personal history of urinary (tract) infections: Secondary | ICD-10-CM | POA: Diagnosis not present

## 2019-02-04 DIAGNOSIS — M1711 Unilateral primary osteoarthritis, right knee: Secondary | ICD-10-CM | POA: Diagnosis not present

## 2019-02-04 DIAGNOSIS — I480 Paroxysmal atrial fibrillation: Secondary | ICD-10-CM | POA: Diagnosis not present

## 2019-02-04 DIAGNOSIS — I358 Other nonrheumatic aortic valve disorders: Secondary | ICD-10-CM | POA: Diagnosis not present

## 2019-02-04 DIAGNOSIS — E1122 Type 2 diabetes mellitus with diabetic chronic kidney disease: Secondary | ICD-10-CM | POA: Diagnosis not present

## 2019-02-04 DIAGNOSIS — I130 Hypertensive heart and chronic kidney disease with heart failure and stage 1 through stage 4 chronic kidney disease, or unspecified chronic kidney disease: Secondary | ICD-10-CM | POA: Diagnosis not present

## 2019-02-04 DIAGNOSIS — I484 Atypical atrial flutter: Secondary | ICD-10-CM | POA: Diagnosis not present

## 2019-02-04 DIAGNOSIS — E1136 Type 2 diabetes mellitus with diabetic cataract: Secondary | ICD-10-CM | POA: Diagnosis not present

## 2019-02-04 DIAGNOSIS — N183 Chronic kidney disease, stage 3 (moderate): Secondary | ICD-10-CM | POA: Diagnosis not present

## 2019-02-04 DIAGNOSIS — Z794 Long term (current) use of insulin: Secondary | ICD-10-CM | POA: Diagnosis not present

## 2019-02-04 DIAGNOSIS — Z9181 History of falling: Secondary | ICD-10-CM | POA: Diagnosis not present

## 2019-02-04 DIAGNOSIS — Z89022 Acquired absence of left finger(s): Secondary | ICD-10-CM | POA: Diagnosis not present

## 2019-02-04 DIAGNOSIS — D631 Anemia in chronic kidney disease: Secondary | ICD-10-CM | POA: Diagnosis not present

## 2019-02-04 DIAGNOSIS — I5033 Acute on chronic diastolic (congestive) heart failure: Secondary | ICD-10-CM | POA: Diagnosis not present

## 2019-02-04 DIAGNOSIS — I495 Sick sinus syndrome: Secondary | ICD-10-CM | POA: Diagnosis not present

## 2019-02-07 DIAGNOSIS — I358 Other nonrheumatic aortic valve disorders: Secondary | ICD-10-CM | POA: Diagnosis not present

## 2019-02-07 DIAGNOSIS — Z87440 Personal history of urinary (tract) infections: Secondary | ICD-10-CM | POA: Diagnosis not present

## 2019-02-07 DIAGNOSIS — I5033 Acute on chronic diastolic (congestive) heart failure: Secondary | ICD-10-CM | POA: Diagnosis not present

## 2019-02-07 DIAGNOSIS — D631 Anemia in chronic kidney disease: Secondary | ICD-10-CM | POA: Diagnosis not present

## 2019-02-07 DIAGNOSIS — Z89022 Acquired absence of left finger(s): Secondary | ICD-10-CM | POA: Diagnosis not present

## 2019-02-07 DIAGNOSIS — M1711 Unilateral primary osteoarthritis, right knee: Secondary | ICD-10-CM | POA: Diagnosis not present

## 2019-02-07 DIAGNOSIS — Z4781 Encounter for orthopedic aftercare following surgical amputation: Secondary | ICD-10-CM | POA: Diagnosis not present

## 2019-02-07 DIAGNOSIS — E1122 Type 2 diabetes mellitus with diabetic chronic kidney disease: Secondary | ICD-10-CM | POA: Diagnosis not present

## 2019-02-07 DIAGNOSIS — Z7901 Long term (current) use of anticoagulants: Secondary | ICD-10-CM | POA: Diagnosis not present

## 2019-02-07 DIAGNOSIS — N183 Chronic kidney disease, stage 3 (moderate): Secondary | ICD-10-CM | POA: Diagnosis not present

## 2019-02-07 DIAGNOSIS — Z794 Long term (current) use of insulin: Secondary | ICD-10-CM | POA: Diagnosis not present

## 2019-02-07 DIAGNOSIS — I480 Paroxysmal atrial fibrillation: Secondary | ICD-10-CM | POA: Diagnosis not present

## 2019-02-07 DIAGNOSIS — I484 Atypical atrial flutter: Secondary | ICD-10-CM | POA: Diagnosis not present

## 2019-02-07 DIAGNOSIS — I495 Sick sinus syndrome: Secondary | ICD-10-CM | POA: Diagnosis not present

## 2019-02-07 DIAGNOSIS — Z9181 History of falling: Secondary | ICD-10-CM | POA: Diagnosis not present

## 2019-02-07 DIAGNOSIS — E1136 Type 2 diabetes mellitus with diabetic cataract: Secondary | ICD-10-CM | POA: Diagnosis not present

## 2019-02-07 DIAGNOSIS — I130 Hypertensive heart and chronic kidney disease with heart failure and stage 1 through stage 4 chronic kidney disease, or unspecified chronic kidney disease: Secondary | ICD-10-CM | POA: Diagnosis not present

## 2019-02-10 DIAGNOSIS — Z4781 Encounter for orthopedic aftercare following surgical amputation: Secondary | ICD-10-CM | POA: Diagnosis not present

## 2019-02-10 DIAGNOSIS — Z87440 Personal history of urinary (tract) infections: Secondary | ICD-10-CM | POA: Diagnosis not present

## 2019-02-10 DIAGNOSIS — I495 Sick sinus syndrome: Secondary | ICD-10-CM | POA: Diagnosis not present

## 2019-02-10 DIAGNOSIS — D631 Anemia in chronic kidney disease: Secondary | ICD-10-CM | POA: Diagnosis not present

## 2019-02-10 DIAGNOSIS — Z89022 Acquired absence of left finger(s): Secondary | ICD-10-CM | POA: Diagnosis not present

## 2019-02-10 DIAGNOSIS — E1136 Type 2 diabetes mellitus with diabetic cataract: Secondary | ICD-10-CM | POA: Diagnosis not present

## 2019-02-10 DIAGNOSIS — I484 Atypical atrial flutter: Secondary | ICD-10-CM | POA: Diagnosis not present

## 2019-02-10 DIAGNOSIS — Z9181 History of falling: Secondary | ICD-10-CM | POA: Diagnosis not present

## 2019-02-10 DIAGNOSIS — I5033 Acute on chronic diastolic (congestive) heart failure: Secondary | ICD-10-CM | POA: Diagnosis not present

## 2019-02-10 DIAGNOSIS — I358 Other nonrheumatic aortic valve disorders: Secondary | ICD-10-CM | POA: Diagnosis not present

## 2019-02-10 DIAGNOSIS — Z794 Long term (current) use of insulin: Secondary | ICD-10-CM | POA: Diagnosis not present

## 2019-02-10 DIAGNOSIS — I130 Hypertensive heart and chronic kidney disease with heart failure and stage 1 through stage 4 chronic kidney disease, or unspecified chronic kidney disease: Secondary | ICD-10-CM | POA: Diagnosis not present

## 2019-02-10 DIAGNOSIS — M1711 Unilateral primary osteoarthritis, right knee: Secondary | ICD-10-CM | POA: Diagnosis not present

## 2019-02-10 DIAGNOSIS — E1122 Type 2 diabetes mellitus with diabetic chronic kidney disease: Secondary | ICD-10-CM | POA: Diagnosis not present

## 2019-02-10 DIAGNOSIS — I480 Paroxysmal atrial fibrillation: Secondary | ICD-10-CM | POA: Diagnosis not present

## 2019-02-10 DIAGNOSIS — Z7901 Long term (current) use of anticoagulants: Secondary | ICD-10-CM | POA: Diagnosis not present

## 2019-02-10 DIAGNOSIS — N183 Chronic kidney disease, stage 3 (moderate): Secondary | ICD-10-CM | POA: Diagnosis not present

## 2019-02-11 DIAGNOSIS — Z7901 Long term (current) use of anticoagulants: Secondary | ICD-10-CM | POA: Diagnosis not present

## 2019-02-11 DIAGNOSIS — I480 Paroxysmal atrial fibrillation: Secondary | ICD-10-CM | POA: Diagnosis not present

## 2019-02-11 DIAGNOSIS — I484 Atypical atrial flutter: Secondary | ICD-10-CM | POA: Diagnosis not present

## 2019-02-11 DIAGNOSIS — Z9181 History of falling: Secondary | ICD-10-CM | POA: Diagnosis not present

## 2019-02-11 DIAGNOSIS — E1136 Type 2 diabetes mellitus with diabetic cataract: Secondary | ICD-10-CM | POA: Diagnosis not present

## 2019-02-11 DIAGNOSIS — I495 Sick sinus syndrome: Secondary | ICD-10-CM | POA: Diagnosis not present

## 2019-02-11 DIAGNOSIS — N183 Chronic kidney disease, stage 3 (moderate): Secondary | ICD-10-CM | POA: Diagnosis not present

## 2019-02-11 DIAGNOSIS — Z87440 Personal history of urinary (tract) infections: Secondary | ICD-10-CM | POA: Diagnosis not present

## 2019-02-11 DIAGNOSIS — Z89022 Acquired absence of left finger(s): Secondary | ICD-10-CM | POA: Diagnosis not present

## 2019-02-11 DIAGNOSIS — I5033 Acute on chronic diastolic (congestive) heart failure: Secondary | ICD-10-CM | POA: Diagnosis not present

## 2019-02-11 DIAGNOSIS — D631 Anemia in chronic kidney disease: Secondary | ICD-10-CM | POA: Diagnosis not present

## 2019-02-11 DIAGNOSIS — E1122 Type 2 diabetes mellitus with diabetic chronic kidney disease: Secondary | ICD-10-CM | POA: Diagnosis not present

## 2019-02-11 DIAGNOSIS — I358 Other nonrheumatic aortic valve disorders: Secondary | ICD-10-CM | POA: Diagnosis not present

## 2019-02-11 DIAGNOSIS — M1711 Unilateral primary osteoarthritis, right knee: Secondary | ICD-10-CM | POA: Diagnosis not present

## 2019-02-11 DIAGNOSIS — Z4781 Encounter for orthopedic aftercare following surgical amputation: Secondary | ICD-10-CM | POA: Diagnosis not present

## 2019-02-11 DIAGNOSIS — I130 Hypertensive heart and chronic kidney disease with heart failure and stage 1 through stage 4 chronic kidney disease, or unspecified chronic kidney disease: Secondary | ICD-10-CM | POA: Diagnosis not present

## 2019-02-11 DIAGNOSIS — Z794 Long term (current) use of insulin: Secondary | ICD-10-CM | POA: Diagnosis not present

## 2019-02-14 DIAGNOSIS — I480 Paroxysmal atrial fibrillation: Secondary | ICD-10-CM | POA: Diagnosis not present

## 2019-02-14 DIAGNOSIS — Z89022 Acquired absence of left finger(s): Secondary | ICD-10-CM | POA: Diagnosis not present

## 2019-02-14 DIAGNOSIS — N183 Chronic kidney disease, stage 3 (moderate): Secondary | ICD-10-CM | POA: Diagnosis not present

## 2019-02-14 DIAGNOSIS — I130 Hypertensive heart and chronic kidney disease with heart failure and stage 1 through stage 4 chronic kidney disease, or unspecified chronic kidney disease: Secondary | ICD-10-CM | POA: Diagnosis not present

## 2019-02-14 DIAGNOSIS — I484 Atypical atrial flutter: Secondary | ICD-10-CM | POA: Diagnosis not present

## 2019-02-14 DIAGNOSIS — Z7901 Long term (current) use of anticoagulants: Secondary | ICD-10-CM | POA: Diagnosis not present

## 2019-02-14 DIAGNOSIS — E1122 Type 2 diabetes mellitus with diabetic chronic kidney disease: Secondary | ICD-10-CM | POA: Diagnosis not present

## 2019-02-14 DIAGNOSIS — M1711 Unilateral primary osteoarthritis, right knee: Secondary | ICD-10-CM | POA: Diagnosis not present

## 2019-02-14 DIAGNOSIS — Z4781 Encounter for orthopedic aftercare following surgical amputation: Secondary | ICD-10-CM | POA: Diagnosis not present

## 2019-02-14 DIAGNOSIS — I358 Other nonrheumatic aortic valve disorders: Secondary | ICD-10-CM | POA: Diagnosis not present

## 2019-02-14 DIAGNOSIS — E1136 Type 2 diabetes mellitus with diabetic cataract: Secondary | ICD-10-CM | POA: Diagnosis not present

## 2019-02-14 DIAGNOSIS — Z9181 History of falling: Secondary | ICD-10-CM | POA: Diagnosis not present

## 2019-02-14 DIAGNOSIS — Z794 Long term (current) use of insulin: Secondary | ICD-10-CM | POA: Diagnosis not present

## 2019-02-14 DIAGNOSIS — D631 Anemia in chronic kidney disease: Secondary | ICD-10-CM | POA: Diagnosis not present

## 2019-02-14 DIAGNOSIS — I495 Sick sinus syndrome: Secondary | ICD-10-CM | POA: Diagnosis not present

## 2019-02-14 DIAGNOSIS — I5033 Acute on chronic diastolic (congestive) heart failure: Secondary | ICD-10-CM | POA: Diagnosis not present

## 2019-02-14 DIAGNOSIS — Z87440 Personal history of urinary (tract) infections: Secondary | ICD-10-CM | POA: Diagnosis not present

## 2019-02-17 DIAGNOSIS — Z7901 Long term (current) use of anticoagulants: Secondary | ICD-10-CM | POA: Diagnosis not present

## 2019-02-17 DIAGNOSIS — I495 Sick sinus syndrome: Secondary | ICD-10-CM | POA: Diagnosis not present

## 2019-02-17 DIAGNOSIS — M1711 Unilateral primary osteoarthritis, right knee: Secondary | ICD-10-CM | POA: Diagnosis not present

## 2019-02-17 DIAGNOSIS — I484 Atypical atrial flutter: Secondary | ICD-10-CM | POA: Diagnosis not present

## 2019-02-17 DIAGNOSIS — Z9181 History of falling: Secondary | ICD-10-CM | POA: Diagnosis not present

## 2019-02-17 DIAGNOSIS — Z87440 Personal history of urinary (tract) infections: Secondary | ICD-10-CM | POA: Diagnosis not present

## 2019-02-17 DIAGNOSIS — Z794 Long term (current) use of insulin: Secondary | ICD-10-CM | POA: Diagnosis not present

## 2019-02-17 DIAGNOSIS — D631 Anemia in chronic kidney disease: Secondary | ICD-10-CM | POA: Diagnosis not present

## 2019-02-17 DIAGNOSIS — N183 Chronic kidney disease, stage 3 (moderate): Secondary | ICD-10-CM | POA: Diagnosis not present

## 2019-02-17 DIAGNOSIS — I358 Other nonrheumatic aortic valve disorders: Secondary | ICD-10-CM | POA: Diagnosis not present

## 2019-02-17 DIAGNOSIS — E1136 Type 2 diabetes mellitus with diabetic cataract: Secondary | ICD-10-CM | POA: Diagnosis not present

## 2019-02-17 DIAGNOSIS — I480 Paroxysmal atrial fibrillation: Secondary | ICD-10-CM | POA: Diagnosis not present

## 2019-02-17 DIAGNOSIS — Z4781 Encounter for orthopedic aftercare following surgical amputation: Secondary | ICD-10-CM | POA: Diagnosis not present

## 2019-02-17 DIAGNOSIS — I5033 Acute on chronic diastolic (congestive) heart failure: Secondary | ICD-10-CM | POA: Diagnosis not present

## 2019-02-17 DIAGNOSIS — Z89022 Acquired absence of left finger(s): Secondary | ICD-10-CM | POA: Diagnosis not present

## 2019-02-17 DIAGNOSIS — E1122 Type 2 diabetes mellitus with diabetic chronic kidney disease: Secondary | ICD-10-CM | POA: Diagnosis not present

## 2019-02-17 DIAGNOSIS — I130 Hypertensive heart and chronic kidney disease with heart failure and stage 1 through stage 4 chronic kidney disease, or unspecified chronic kidney disease: Secondary | ICD-10-CM | POA: Diagnosis not present

## 2019-02-18 DIAGNOSIS — D631 Anemia in chronic kidney disease: Secondary | ICD-10-CM | POA: Diagnosis not present

## 2019-02-18 DIAGNOSIS — I480 Paroxysmal atrial fibrillation: Secondary | ICD-10-CM | POA: Diagnosis not present

## 2019-02-18 DIAGNOSIS — E1122 Type 2 diabetes mellitus with diabetic chronic kidney disease: Secondary | ICD-10-CM | POA: Diagnosis not present

## 2019-02-18 DIAGNOSIS — N183 Chronic kidney disease, stage 3 (moderate): Secondary | ICD-10-CM | POA: Diagnosis not present

## 2019-02-18 DIAGNOSIS — I5033 Acute on chronic diastolic (congestive) heart failure: Secondary | ICD-10-CM | POA: Diagnosis not present

## 2019-02-18 DIAGNOSIS — I484 Atypical atrial flutter: Secondary | ICD-10-CM | POA: Diagnosis not present

## 2019-02-18 DIAGNOSIS — Z4781 Encounter for orthopedic aftercare following surgical amputation: Secondary | ICD-10-CM | POA: Diagnosis not present

## 2019-02-18 DIAGNOSIS — Z794 Long term (current) use of insulin: Secondary | ICD-10-CM | POA: Diagnosis not present

## 2019-02-18 DIAGNOSIS — E1136 Type 2 diabetes mellitus with diabetic cataract: Secondary | ICD-10-CM | POA: Diagnosis not present

## 2019-02-18 DIAGNOSIS — I358 Other nonrheumatic aortic valve disorders: Secondary | ICD-10-CM | POA: Diagnosis not present

## 2019-02-18 DIAGNOSIS — Z89022 Acquired absence of left finger(s): Secondary | ICD-10-CM | POA: Diagnosis not present

## 2019-02-18 DIAGNOSIS — M1711 Unilateral primary osteoarthritis, right knee: Secondary | ICD-10-CM | POA: Diagnosis not present

## 2019-02-18 DIAGNOSIS — I130 Hypertensive heart and chronic kidney disease with heart failure and stage 1 through stage 4 chronic kidney disease, or unspecified chronic kidney disease: Secondary | ICD-10-CM | POA: Diagnosis not present

## 2019-02-18 DIAGNOSIS — Z87440 Personal history of urinary (tract) infections: Secondary | ICD-10-CM | POA: Diagnosis not present

## 2019-02-18 DIAGNOSIS — Z7901 Long term (current) use of anticoagulants: Secondary | ICD-10-CM | POA: Diagnosis not present

## 2019-02-18 DIAGNOSIS — Z9181 History of falling: Secondary | ICD-10-CM | POA: Diagnosis not present

## 2019-02-18 DIAGNOSIS — I495 Sick sinus syndrome: Secondary | ICD-10-CM | POA: Diagnosis not present

## 2019-02-20 DIAGNOSIS — Z9181 History of falling: Secondary | ICD-10-CM | POA: Diagnosis not present

## 2019-02-20 DIAGNOSIS — I480 Paroxysmal atrial fibrillation: Secondary | ICD-10-CM | POA: Diagnosis not present

## 2019-02-20 DIAGNOSIS — M1711 Unilateral primary osteoarthritis, right knee: Secondary | ICD-10-CM | POA: Diagnosis not present

## 2019-02-20 DIAGNOSIS — Z794 Long term (current) use of insulin: Secondary | ICD-10-CM | POA: Diagnosis not present

## 2019-02-20 DIAGNOSIS — I130 Hypertensive heart and chronic kidney disease with heart failure and stage 1 through stage 4 chronic kidney disease, or unspecified chronic kidney disease: Secondary | ICD-10-CM | POA: Diagnosis not present

## 2019-02-20 DIAGNOSIS — Z87440 Personal history of urinary (tract) infections: Secondary | ICD-10-CM | POA: Diagnosis not present

## 2019-02-20 DIAGNOSIS — I495 Sick sinus syndrome: Secondary | ICD-10-CM | POA: Diagnosis not present

## 2019-02-20 DIAGNOSIS — Z4781 Encounter for orthopedic aftercare following surgical amputation: Secondary | ICD-10-CM | POA: Diagnosis not present

## 2019-02-20 DIAGNOSIS — I5033 Acute on chronic diastolic (congestive) heart failure: Secondary | ICD-10-CM | POA: Diagnosis not present

## 2019-02-20 DIAGNOSIS — E1136 Type 2 diabetes mellitus with diabetic cataract: Secondary | ICD-10-CM | POA: Diagnosis not present

## 2019-02-20 DIAGNOSIS — I484 Atypical atrial flutter: Secondary | ICD-10-CM | POA: Diagnosis not present

## 2019-02-20 DIAGNOSIS — E1122 Type 2 diabetes mellitus with diabetic chronic kidney disease: Secondary | ICD-10-CM | POA: Diagnosis not present

## 2019-02-20 DIAGNOSIS — Z89022 Acquired absence of left finger(s): Secondary | ICD-10-CM | POA: Diagnosis not present

## 2019-02-20 DIAGNOSIS — Z7901 Long term (current) use of anticoagulants: Secondary | ICD-10-CM | POA: Diagnosis not present

## 2019-02-20 DIAGNOSIS — D631 Anemia in chronic kidney disease: Secondary | ICD-10-CM | POA: Diagnosis not present

## 2019-02-20 DIAGNOSIS — I358 Other nonrheumatic aortic valve disorders: Secondary | ICD-10-CM | POA: Diagnosis not present

## 2019-02-20 DIAGNOSIS — N183 Chronic kidney disease, stage 3 (moderate): Secondary | ICD-10-CM | POA: Diagnosis not present

## 2019-02-21 DIAGNOSIS — Z89022 Acquired absence of left finger(s): Secondary | ICD-10-CM | POA: Diagnosis not present

## 2019-02-21 DIAGNOSIS — Z87440 Personal history of urinary (tract) infections: Secondary | ICD-10-CM | POA: Diagnosis not present

## 2019-02-21 DIAGNOSIS — I130 Hypertensive heart and chronic kidney disease with heart failure and stage 1 through stage 4 chronic kidney disease, or unspecified chronic kidney disease: Secondary | ICD-10-CM | POA: Diagnosis not present

## 2019-02-21 DIAGNOSIS — Z7901 Long term (current) use of anticoagulants: Secondary | ICD-10-CM | POA: Diagnosis not present

## 2019-02-21 DIAGNOSIS — Z9181 History of falling: Secondary | ICD-10-CM | POA: Diagnosis not present

## 2019-02-21 DIAGNOSIS — Z794 Long term (current) use of insulin: Secondary | ICD-10-CM | POA: Diagnosis not present

## 2019-02-21 DIAGNOSIS — I484 Atypical atrial flutter: Secondary | ICD-10-CM | POA: Diagnosis not present

## 2019-02-21 DIAGNOSIS — E1136 Type 2 diabetes mellitus with diabetic cataract: Secondary | ICD-10-CM | POA: Diagnosis not present

## 2019-02-21 DIAGNOSIS — D631 Anemia in chronic kidney disease: Secondary | ICD-10-CM | POA: Diagnosis not present

## 2019-02-21 DIAGNOSIS — I5033 Acute on chronic diastolic (congestive) heart failure: Secondary | ICD-10-CM | POA: Diagnosis not present

## 2019-02-21 DIAGNOSIS — Z4781 Encounter for orthopedic aftercare following surgical amputation: Secondary | ICD-10-CM | POA: Diagnosis not present

## 2019-02-21 DIAGNOSIS — I495 Sick sinus syndrome: Secondary | ICD-10-CM | POA: Diagnosis not present

## 2019-02-21 DIAGNOSIS — M1711 Unilateral primary osteoarthritis, right knee: Secondary | ICD-10-CM | POA: Diagnosis not present

## 2019-02-21 DIAGNOSIS — N183 Chronic kidney disease, stage 3 (moderate): Secondary | ICD-10-CM | POA: Diagnosis not present

## 2019-02-21 DIAGNOSIS — I358 Other nonrheumatic aortic valve disorders: Secondary | ICD-10-CM | POA: Diagnosis not present

## 2019-02-21 DIAGNOSIS — I480 Paroxysmal atrial fibrillation: Secondary | ICD-10-CM | POA: Diagnosis not present

## 2019-02-21 DIAGNOSIS — E1122 Type 2 diabetes mellitus with diabetic chronic kidney disease: Secondary | ICD-10-CM | POA: Diagnosis not present

## 2019-02-24 DIAGNOSIS — Z87440 Personal history of urinary (tract) infections: Secondary | ICD-10-CM | POA: Diagnosis not present

## 2019-02-24 DIAGNOSIS — D631 Anemia in chronic kidney disease: Secondary | ICD-10-CM | POA: Diagnosis not present

## 2019-02-24 DIAGNOSIS — N183 Chronic kidney disease, stage 3 (moderate): Secondary | ICD-10-CM | POA: Diagnosis not present

## 2019-02-24 DIAGNOSIS — I484 Atypical atrial flutter: Secondary | ICD-10-CM | POA: Diagnosis not present

## 2019-02-24 DIAGNOSIS — Z9181 History of falling: Secondary | ICD-10-CM | POA: Diagnosis not present

## 2019-02-24 DIAGNOSIS — E1122 Type 2 diabetes mellitus with diabetic chronic kidney disease: Secondary | ICD-10-CM | POA: Diagnosis not present

## 2019-02-24 DIAGNOSIS — I358 Other nonrheumatic aortic valve disorders: Secondary | ICD-10-CM | POA: Diagnosis not present

## 2019-02-24 DIAGNOSIS — M1711 Unilateral primary osteoarthritis, right knee: Secondary | ICD-10-CM | POA: Diagnosis not present

## 2019-02-24 DIAGNOSIS — I495 Sick sinus syndrome: Secondary | ICD-10-CM | POA: Diagnosis not present

## 2019-02-24 DIAGNOSIS — I480 Paroxysmal atrial fibrillation: Secondary | ICD-10-CM | POA: Diagnosis not present

## 2019-02-24 DIAGNOSIS — Z4781 Encounter for orthopedic aftercare following surgical amputation: Secondary | ICD-10-CM | POA: Diagnosis not present

## 2019-02-24 DIAGNOSIS — I130 Hypertensive heart and chronic kidney disease with heart failure and stage 1 through stage 4 chronic kidney disease, or unspecified chronic kidney disease: Secondary | ICD-10-CM | POA: Diagnosis not present

## 2019-02-24 DIAGNOSIS — Z794 Long term (current) use of insulin: Secondary | ICD-10-CM | POA: Diagnosis not present

## 2019-02-24 DIAGNOSIS — I5033 Acute on chronic diastolic (congestive) heart failure: Secondary | ICD-10-CM | POA: Diagnosis not present

## 2019-02-24 DIAGNOSIS — E1136 Type 2 diabetes mellitus with diabetic cataract: Secondary | ICD-10-CM | POA: Diagnosis not present

## 2019-02-24 DIAGNOSIS — Z7901 Long term (current) use of anticoagulants: Secondary | ICD-10-CM | POA: Diagnosis not present

## 2019-02-24 DIAGNOSIS — Z89022 Acquired absence of left finger(s): Secondary | ICD-10-CM | POA: Diagnosis not present

## 2019-02-27 DIAGNOSIS — Z9181 History of falling: Secondary | ICD-10-CM | POA: Diagnosis not present

## 2019-02-27 DIAGNOSIS — E1122 Type 2 diabetes mellitus with diabetic chronic kidney disease: Secondary | ICD-10-CM | POA: Diagnosis not present

## 2019-02-27 DIAGNOSIS — M1711 Unilateral primary osteoarthritis, right knee: Secondary | ICD-10-CM | POA: Diagnosis not present

## 2019-02-27 DIAGNOSIS — Z4781 Encounter for orthopedic aftercare following surgical amputation: Secondary | ICD-10-CM | POA: Diagnosis not present

## 2019-02-27 DIAGNOSIS — N183 Chronic kidney disease, stage 3 (moderate): Secondary | ICD-10-CM | POA: Diagnosis not present

## 2019-02-27 DIAGNOSIS — Z87440 Personal history of urinary (tract) infections: Secondary | ICD-10-CM | POA: Diagnosis not present

## 2019-02-27 DIAGNOSIS — Z7901 Long term (current) use of anticoagulants: Secondary | ICD-10-CM | POA: Diagnosis not present

## 2019-02-27 DIAGNOSIS — Z89022 Acquired absence of left finger(s): Secondary | ICD-10-CM | POA: Diagnosis not present

## 2019-02-27 DIAGNOSIS — I484 Atypical atrial flutter: Secondary | ICD-10-CM | POA: Diagnosis not present

## 2019-02-27 DIAGNOSIS — I480 Paroxysmal atrial fibrillation: Secondary | ICD-10-CM | POA: Diagnosis not present

## 2019-02-27 DIAGNOSIS — E1136 Type 2 diabetes mellitus with diabetic cataract: Secondary | ICD-10-CM | POA: Diagnosis not present

## 2019-02-27 DIAGNOSIS — I495 Sick sinus syndrome: Secondary | ICD-10-CM | POA: Diagnosis not present

## 2019-02-27 DIAGNOSIS — I358 Other nonrheumatic aortic valve disorders: Secondary | ICD-10-CM | POA: Diagnosis not present

## 2019-02-27 DIAGNOSIS — Z794 Long term (current) use of insulin: Secondary | ICD-10-CM | POA: Diagnosis not present

## 2019-02-27 DIAGNOSIS — I130 Hypertensive heart and chronic kidney disease with heart failure and stage 1 through stage 4 chronic kidney disease, or unspecified chronic kidney disease: Secondary | ICD-10-CM | POA: Diagnosis not present

## 2019-02-27 DIAGNOSIS — D631 Anemia in chronic kidney disease: Secondary | ICD-10-CM | POA: Diagnosis not present

## 2019-02-27 DIAGNOSIS — I5033 Acute on chronic diastolic (congestive) heart failure: Secondary | ICD-10-CM | POA: Diagnosis not present

## 2019-03-04 DIAGNOSIS — Z794 Long term (current) use of insulin: Secondary | ICD-10-CM | POA: Diagnosis not present

## 2019-03-04 DIAGNOSIS — N183 Chronic kidney disease, stage 3 (moderate): Secondary | ICD-10-CM | POA: Diagnosis not present

## 2019-03-04 DIAGNOSIS — I480 Paroxysmal atrial fibrillation: Secondary | ICD-10-CM | POA: Diagnosis not present

## 2019-03-04 DIAGNOSIS — D631 Anemia in chronic kidney disease: Secondary | ICD-10-CM | POA: Diagnosis not present

## 2019-03-04 DIAGNOSIS — I5033 Acute on chronic diastolic (congestive) heart failure: Secondary | ICD-10-CM | POA: Diagnosis not present

## 2019-03-04 DIAGNOSIS — E1122 Type 2 diabetes mellitus with diabetic chronic kidney disease: Secondary | ICD-10-CM | POA: Diagnosis not present

## 2019-03-04 DIAGNOSIS — Z9181 History of falling: Secondary | ICD-10-CM | POA: Diagnosis not present

## 2019-03-04 DIAGNOSIS — M1711 Unilateral primary osteoarthritis, right knee: Secondary | ICD-10-CM | POA: Diagnosis not present

## 2019-03-04 DIAGNOSIS — Z7901 Long term (current) use of anticoagulants: Secondary | ICD-10-CM | POA: Diagnosis not present

## 2019-03-04 DIAGNOSIS — E1136 Type 2 diabetes mellitus with diabetic cataract: Secondary | ICD-10-CM | POA: Diagnosis not present

## 2019-03-04 DIAGNOSIS — I358 Other nonrheumatic aortic valve disorders: Secondary | ICD-10-CM | POA: Diagnosis not present

## 2019-03-04 DIAGNOSIS — Z87440 Personal history of urinary (tract) infections: Secondary | ICD-10-CM | POA: Diagnosis not present

## 2019-03-04 DIAGNOSIS — Z89022 Acquired absence of left finger(s): Secondary | ICD-10-CM | POA: Diagnosis not present

## 2019-03-04 DIAGNOSIS — I484 Atypical atrial flutter: Secondary | ICD-10-CM | POA: Diagnosis not present

## 2019-03-04 DIAGNOSIS — I130 Hypertensive heart and chronic kidney disease with heart failure and stage 1 through stage 4 chronic kidney disease, or unspecified chronic kidney disease: Secondary | ICD-10-CM | POA: Diagnosis not present

## 2019-03-04 DIAGNOSIS — I495 Sick sinus syndrome: Secondary | ICD-10-CM | POA: Diagnosis not present

## 2019-03-04 DIAGNOSIS — Z4781 Encounter for orthopedic aftercare following surgical amputation: Secondary | ICD-10-CM | POA: Diagnosis not present

## 2019-03-13 DIAGNOSIS — I484 Atypical atrial flutter: Secondary | ICD-10-CM | POA: Diagnosis not present

## 2019-03-13 DIAGNOSIS — Z7901 Long term (current) use of anticoagulants: Secondary | ICD-10-CM | POA: Diagnosis not present

## 2019-03-13 DIAGNOSIS — Z4781 Encounter for orthopedic aftercare following surgical amputation: Secondary | ICD-10-CM | POA: Diagnosis not present

## 2019-03-13 DIAGNOSIS — M1711 Unilateral primary osteoarthritis, right knee: Secondary | ICD-10-CM | POA: Diagnosis not present

## 2019-03-13 DIAGNOSIS — N183 Chronic kidney disease, stage 3 (moderate): Secondary | ICD-10-CM | POA: Diagnosis not present

## 2019-03-13 DIAGNOSIS — I358 Other nonrheumatic aortic valve disorders: Secondary | ICD-10-CM | POA: Diagnosis not present

## 2019-03-13 DIAGNOSIS — Z794 Long term (current) use of insulin: Secondary | ICD-10-CM | POA: Diagnosis not present

## 2019-03-13 DIAGNOSIS — Z9181 History of falling: Secondary | ICD-10-CM | POA: Diagnosis not present

## 2019-03-13 DIAGNOSIS — E1122 Type 2 diabetes mellitus with diabetic chronic kidney disease: Secondary | ICD-10-CM | POA: Diagnosis not present

## 2019-03-13 DIAGNOSIS — I5033 Acute on chronic diastolic (congestive) heart failure: Secondary | ICD-10-CM | POA: Diagnosis not present

## 2019-03-13 DIAGNOSIS — I480 Paroxysmal atrial fibrillation: Secondary | ICD-10-CM | POA: Diagnosis not present

## 2019-03-13 DIAGNOSIS — I130 Hypertensive heart and chronic kidney disease with heart failure and stage 1 through stage 4 chronic kidney disease, or unspecified chronic kidney disease: Secondary | ICD-10-CM | POA: Diagnosis not present

## 2019-03-13 DIAGNOSIS — D631 Anemia in chronic kidney disease: Secondary | ICD-10-CM | POA: Diagnosis not present

## 2019-03-13 DIAGNOSIS — I495 Sick sinus syndrome: Secondary | ICD-10-CM | POA: Diagnosis not present

## 2019-03-13 DIAGNOSIS — Z89022 Acquired absence of left finger(s): Secondary | ICD-10-CM | POA: Diagnosis not present

## 2019-03-13 DIAGNOSIS — E1136 Type 2 diabetes mellitus with diabetic cataract: Secondary | ICD-10-CM | POA: Diagnosis not present

## 2019-03-13 DIAGNOSIS — Z87440 Personal history of urinary (tract) infections: Secondary | ICD-10-CM | POA: Diagnosis not present

## 2019-03-17 DIAGNOSIS — I358 Other nonrheumatic aortic valve disorders: Secondary | ICD-10-CM | POA: Diagnosis not present

## 2019-03-17 DIAGNOSIS — E1122 Type 2 diabetes mellitus with diabetic chronic kidney disease: Secondary | ICD-10-CM | POA: Diagnosis not present

## 2019-03-17 DIAGNOSIS — Z4781 Encounter for orthopedic aftercare following surgical amputation: Secondary | ICD-10-CM | POA: Diagnosis not present

## 2019-03-17 DIAGNOSIS — M1711 Unilateral primary osteoarthritis, right knee: Secondary | ICD-10-CM | POA: Diagnosis not present

## 2019-03-17 DIAGNOSIS — I130 Hypertensive heart and chronic kidney disease with heart failure and stage 1 through stage 4 chronic kidney disease, or unspecified chronic kidney disease: Secondary | ICD-10-CM | POA: Diagnosis not present

## 2019-03-17 DIAGNOSIS — I484 Atypical atrial flutter: Secondary | ICD-10-CM | POA: Diagnosis not present

## 2019-03-17 DIAGNOSIS — Z9181 History of falling: Secondary | ICD-10-CM | POA: Diagnosis not present

## 2019-03-17 DIAGNOSIS — E1136 Type 2 diabetes mellitus with diabetic cataract: Secondary | ICD-10-CM | POA: Diagnosis not present

## 2019-03-17 DIAGNOSIS — Z87440 Personal history of urinary (tract) infections: Secondary | ICD-10-CM | POA: Diagnosis not present

## 2019-03-17 DIAGNOSIS — D631 Anemia in chronic kidney disease: Secondary | ICD-10-CM | POA: Diagnosis not present

## 2019-03-17 DIAGNOSIS — Z89022 Acquired absence of left finger(s): Secondary | ICD-10-CM | POA: Diagnosis not present

## 2019-03-17 DIAGNOSIS — Z794 Long term (current) use of insulin: Secondary | ICD-10-CM | POA: Diagnosis not present

## 2019-03-17 DIAGNOSIS — I480 Paroxysmal atrial fibrillation: Secondary | ICD-10-CM | POA: Diagnosis not present

## 2019-03-17 DIAGNOSIS — N183 Chronic kidney disease, stage 3 (moderate): Secondary | ICD-10-CM | POA: Diagnosis not present

## 2019-03-17 DIAGNOSIS — I5033 Acute on chronic diastolic (congestive) heart failure: Secondary | ICD-10-CM | POA: Diagnosis not present

## 2019-03-17 DIAGNOSIS — Z7901 Long term (current) use of anticoagulants: Secondary | ICD-10-CM | POA: Diagnosis not present

## 2019-03-17 DIAGNOSIS — I495 Sick sinus syndrome: Secondary | ICD-10-CM | POA: Diagnosis not present

## 2019-03-25 ENCOUNTER — Emergency Department (HOSPITAL_COMMUNITY): Payer: Medicare Other

## 2019-03-25 ENCOUNTER — Inpatient Hospital Stay (HOSPITAL_COMMUNITY)
Admission: EM | Admit: 2019-03-25 | Discharge: 2019-04-07 | DRG: 871 | Disposition: A | Discharge status: Skilled Nursing Facility | Payer: Medicare Other | Attending: Internal Medicine | Admitting: Internal Medicine

## 2019-03-25 ENCOUNTER — Encounter (HOSPITAL_COMMUNITY): Payer: Self-pay

## 2019-03-25 ENCOUNTER — Other Ambulatory Visit: Payer: Self-pay

## 2019-03-25 DIAGNOSIS — N1330 Unspecified hydronephrosis: Secondary | ICD-10-CM | POA: Diagnosis present

## 2019-03-25 DIAGNOSIS — E875 Hyperkalemia: Secondary | ICD-10-CM | POA: Diagnosis not present

## 2019-03-25 DIAGNOSIS — M109 Gout, unspecified: Secondary | ICD-10-CM | POA: Diagnosis present

## 2019-03-25 DIAGNOSIS — R609 Edema, unspecified: Secondary | ICD-10-CM

## 2019-03-25 DIAGNOSIS — Z79891 Long term (current) use of opiate analgesic: Secondary | ICD-10-CM

## 2019-03-25 DIAGNOSIS — I248 Other forms of acute ischemic heart disease: Secondary | ICD-10-CM | POA: Diagnosis not present

## 2019-03-25 DIAGNOSIS — M19031 Primary osteoarthritis, right wrist: Secondary | ICD-10-CM | POA: Diagnosis present

## 2019-03-25 DIAGNOSIS — E1129 Type 2 diabetes mellitus with other diabetic kidney complication: Secondary | ICD-10-CM | POA: Diagnosis present

## 2019-03-25 DIAGNOSIS — R197 Diarrhea, unspecified: Secondary | ICD-10-CM | POA: Diagnosis not present

## 2019-03-25 DIAGNOSIS — I10 Essential (primary) hypertension: Secondary | ICD-10-CM | POA: Diagnosis present

## 2019-03-25 DIAGNOSIS — B9620 Unspecified Escherichia coli [E. coli] as the cause of diseases classified elsewhere: Secondary | ICD-10-CM | POA: Diagnosis present

## 2019-03-25 DIAGNOSIS — E1122 Type 2 diabetes mellitus with diabetic chronic kidney disease: Secondary | ICD-10-CM | POA: Diagnosis not present

## 2019-03-25 DIAGNOSIS — R410 Disorientation, unspecified: Secondary | ICD-10-CM

## 2019-03-25 DIAGNOSIS — N183 Chronic kidney disease, stage 3 unspecified: Secondary | ICD-10-CM | POA: Diagnosis present

## 2019-03-25 DIAGNOSIS — Z89022 Acquired absence of left finger(s): Secondary | ICD-10-CM | POA: Diagnosis not present

## 2019-03-25 DIAGNOSIS — M25531 Pain in right wrist: Secondary | ICD-10-CM

## 2019-03-25 DIAGNOSIS — I517 Cardiomegaly: Secondary | ICD-10-CM | POA: Diagnosis present

## 2019-03-25 DIAGNOSIS — A4189 Other specified sepsis: Secondary | ICD-10-CM | POA: Diagnosis not present

## 2019-03-25 DIAGNOSIS — G92 Toxic encephalopathy: Secondary | ICD-10-CM | POA: Diagnosis not present

## 2019-03-25 DIAGNOSIS — D631 Anemia in chronic kidney disease: Secondary | ICD-10-CM | POA: Diagnosis present

## 2019-03-25 DIAGNOSIS — R509 Fever, unspecified: Secondary | ICD-10-CM | POA: Diagnosis not present

## 2019-03-25 DIAGNOSIS — N390 Urinary tract infection, site not specified: Secondary | ICD-10-CM | POA: Diagnosis present

## 2019-03-25 DIAGNOSIS — N189 Chronic kidney disease, unspecified: Secondary | ICD-10-CM | POA: Diagnosis present

## 2019-03-25 DIAGNOSIS — I700 Atherosclerosis of aorta: Secondary | ICD-10-CM | POA: Diagnosis present

## 2019-03-25 DIAGNOSIS — R001 Bradycardia, unspecified: Secondary | ICD-10-CM | POA: Diagnosis present

## 2019-03-25 DIAGNOSIS — Z7901 Long term (current) use of anticoagulants: Secondary | ICD-10-CM | POA: Diagnosis not present

## 2019-03-25 DIAGNOSIS — Z79899 Other long term (current) drug therapy: Secondary | ICD-10-CM

## 2019-03-25 DIAGNOSIS — S68111A Complete traumatic metacarpophalangeal amputation of left index finger, initial encounter: Secondary | ICD-10-CM | POA: Diagnosis present

## 2019-03-25 DIAGNOSIS — Z90710 Acquired absence of both cervix and uterus: Secondary | ICD-10-CM

## 2019-03-25 DIAGNOSIS — G9340 Encephalopathy, unspecified: Secondary | ICD-10-CM | POA: Diagnosis present

## 2019-03-25 DIAGNOSIS — R112 Nausea with vomiting, unspecified: Secondary | ICD-10-CM | POA: Diagnosis not present

## 2019-03-25 DIAGNOSIS — E739 Lactose intolerance, unspecified: Secondary | ICD-10-CM | POA: Diagnosis not present

## 2019-03-25 DIAGNOSIS — D649 Anemia, unspecified: Secondary | ICD-10-CM | POA: Diagnosis present

## 2019-03-25 DIAGNOSIS — I130 Hypertensive heart and chronic kidney disease with heart failure and stage 1 through stage 4 chronic kidney disease, or unspecified chronic kidney disease: Secondary | ICD-10-CM | POA: Diagnosis present

## 2019-03-25 DIAGNOSIS — R4182 Altered mental status, unspecified: Secondary | ICD-10-CM | POA: Diagnosis not present

## 2019-03-25 DIAGNOSIS — R4020 Unspecified coma: Secondary | ICD-10-CM | POA: Diagnosis not present

## 2019-03-25 DIAGNOSIS — N136 Pyonephrosis: Secondary | ICD-10-CM | POA: Diagnosis present

## 2019-03-25 DIAGNOSIS — I440 Atrioventricular block, first degree: Secondary | ICD-10-CM | POA: Diagnosis not present

## 2019-03-25 DIAGNOSIS — B9689 Other specified bacterial agents as the cause of diseases classified elsewhere: Secondary | ICD-10-CM | POA: Diagnosis not present

## 2019-03-25 DIAGNOSIS — R52 Pain, unspecified: Secondary | ICD-10-CM

## 2019-03-25 DIAGNOSIS — A4151 Sepsis due to Escherichia coli [E. coli]: Principal | ICD-10-CM | POA: Diagnosis present

## 2019-03-25 DIAGNOSIS — R778 Other specified abnormalities of plasma proteins: Secondary | ICD-10-CM | POA: Diagnosis present

## 2019-03-25 DIAGNOSIS — I482 Chronic atrial fibrillation, unspecified: Secondary | ICD-10-CM | POA: Diagnosis present

## 2019-03-25 DIAGNOSIS — R7989 Other specified abnormal findings of blood chemistry: Secondary | ICD-10-CM | POA: Diagnosis not present

## 2019-03-25 DIAGNOSIS — I5032 Chronic diastolic (congestive) heart failure: Secondary | ICD-10-CM | POA: Diagnosis present

## 2019-03-25 DIAGNOSIS — Z20828 Contact with and (suspected) exposure to other viral communicable diseases: Secondary | ICD-10-CM | POA: Diagnosis present

## 2019-03-25 DIAGNOSIS — K5730 Diverticulosis of large intestine without perforation or abscess without bleeding: Secondary | ICD-10-CM | POA: Diagnosis not present

## 2019-03-25 DIAGNOSIS — R7881 Bacteremia: Secondary | ICD-10-CM | POA: Diagnosis not present

## 2019-03-25 LAB — COMPREHENSIVE METABOLIC PANEL
ALT: 12 U/L (ref 0–44)
AST: 23 U/L (ref 15–41)
Albumin: 3.6 g/dL (ref 3.5–5.0)
Alkaline Phosphatase: 70 U/L (ref 38–126)
Anion gap: 12 (ref 5–15)
BUN: 45 mg/dL — ABNORMAL HIGH (ref 8–23)
CO2: 22 mmol/L (ref 22–32)
Calcium: 9.4 mg/dL (ref 8.9–10.3)
Chloride: 110 mmol/L (ref 98–111)
Creatinine, Ser: 1.57 mg/dL — ABNORMAL HIGH (ref 0.44–1.00)
GFR calc Af Amer: 34 mL/min — ABNORMAL LOW (ref >60)
GFR calc non Af Amer: 30 mL/min — ABNORMAL LOW (ref >60)
Glucose, Bld: 82 mg/dL (ref 70–99)
Potassium: 3.7 mmol/L (ref 3.5–5.1)
Sodium: 144 mmol/L (ref 135–145)
Total Bilirubin: 1 mg/dL (ref 0.3–1.2)
Total Protein: 6.8 g/dL (ref 6.5–8.1)

## 2019-03-25 LAB — CBC WITH DIFFERENTIAL/PLATELET
Abs Immature Granulocytes: 0.06 10*3/uL (ref 0.00–0.07)
Basophils Absolute: 0 10*3/uL (ref 0.0–0.1)
Basophils Relative: 0 %
Eosinophils Absolute: 0 10*3/uL (ref 0.0–0.5)
Eosinophils Relative: 0 %
HCT: 29.5 % — ABNORMAL LOW (ref 36.0–46.0)
Hemoglobin: 9.2 g/dL — ABNORMAL LOW (ref 12.0–15.0)
Immature Granulocytes: 1 %
Lymphocytes Relative: 3 %
Lymphs Abs: 0.2 10*3/uL — ABNORMAL LOW (ref 0.7–4.0)
MCH: 28.3 pg (ref 26.0–34.0)
MCHC: 31.2 g/dL (ref 30.0–36.0)
MCV: 90.8 fL (ref 80.0–100.0)
Monocytes Absolute: 0.1 10*3/uL (ref 0.1–1.0)
Monocytes Relative: 1 %
Neutro Abs: 7.5 10*3/uL (ref 1.7–7.7)
Neutrophils Relative %: 95 %
Platelets: 174 10*3/uL (ref 150–400)
RBC: 3.25 MIL/uL — ABNORMAL LOW (ref 3.87–5.11)
RDW: 14.4 % (ref 11.5–15.5)
WBC: 7.9 10*3/uL (ref 4.0–10.5)
nRBC: 0 % (ref 0.0–0.2)

## 2019-03-25 LAB — SARS CORONAVIRUS 2 BY RT PCR (HOSPITAL ORDER, PERFORMED IN ~~LOC~~ HOSPITAL LAB): SARS Coronavirus 2: NEGATIVE

## 2019-03-25 LAB — PROTIME-INR
INR: 1.5 — ABNORMAL HIGH (ref 0.8–1.2)
Prothrombin Time: 17.6 seconds — ABNORMAL HIGH (ref 11.4–15.2)

## 2019-03-25 LAB — ABO/RH: ABO/RH(D): A POS

## 2019-03-25 LAB — URINALYSIS, ROUTINE W REFLEX MICROSCOPIC
Bilirubin Urine: NEGATIVE
Glucose, UA: NEGATIVE mg/dL
Ketones, ur: NEGATIVE mg/dL
Nitrite: NEGATIVE
Protein, ur: 30 mg/dL — AB
Specific Gravity, Urine: 1.013 (ref 1.005–1.030)
pH: 5 (ref 5.0–8.0)

## 2019-03-25 LAB — TYPE AND SCREEN
ABO/RH(D): A POS
Antibody Screen: NEGATIVE

## 2019-03-25 LAB — LACTIC ACID, PLASMA: Lactic Acid, Venous: 2.5 mmol/L (ref 0.5–1.9)

## 2019-03-25 LAB — TROPONIN I: Troponin I: 0.06 ng/mL (ref <0.03)

## 2019-03-25 LAB — SAMPLE TO BLOOD BANK

## 2019-03-25 LAB — POC OCCULT BLOOD, ED: Fecal Occult Bld: NEGATIVE

## 2019-03-25 LAB — LIPASE, BLOOD: Lipase: 24 U/L (ref 11–51)

## 2019-03-25 MED ORDER — ACETAMINOPHEN 325 MG PO TABS
650.0000 mg | ORAL_TABLET | Freq: Once | ORAL | Status: AC
  Administered 2019-03-25: 650 mg via ORAL
  Filled 2019-03-25: qty 2

## 2019-03-25 MED ORDER — ONDANSETRON HCL 4 MG/2ML IJ SOLN
4.0000 mg | Freq: Once | INTRAMUSCULAR | Status: AC
  Administered 2019-03-25: 4 mg via INTRAVENOUS
  Filled 2019-03-25: qty 2

## 2019-03-25 MED ORDER — IOHEXOL 300 MG/ML  SOLN
100.0000 mL | Freq: Once | INTRAMUSCULAR | Status: AC | PRN
  Administered 2019-03-25: 100 mL via INTRAVENOUS

## 2019-03-25 MED ORDER — SODIUM CHLORIDE 0.9 % IV SOLN
1.0000 g | Freq: Once | INTRAVENOUS | Status: AC
  Administered 2019-03-25: 1 g via INTRAVENOUS
  Filled 2019-03-25: qty 10

## 2019-03-25 MED ORDER — SODIUM CHLORIDE 0.9 % IV BOLUS
500.0000 mL | Freq: Once | INTRAVENOUS | Status: AC
  Administered 2019-03-25: 500 mL via INTRAVENOUS

## 2019-03-25 NOTE — ED Notes (Addendum)
CBG Obtained : 75mg /dL

## 2019-03-25 NOTE — ED Notes (Signed)
Purewick applied.

## 2019-03-25 NOTE — ED Notes (Signed)
Pt placed on bedpan and cleaned up. Purewick replaced.

## 2019-03-25 NOTE — ED Provider Notes (Signed)
[facility] EMERGENCY DEPARTMENT Provider Note   CSN: [id number] Arrival date & time: 03/25/19  1734    History   Chief Complaint Chief Complaint  Patient presents with  . Loss of Consciousness  . Emesis    HPI Courtney Bradford is a 85 y.o. female.  Level 5 caveat secondary to altered mental status.  Patient is brought in by her family members for evaluation of altered mental status.  Apparently she was home and when her CNA found her she was difficult to arouse and less responsive.  Family members found her to be altered.  It sounds like she vomited 5 times nonbloody and had black tarry stools.  Patient does not recall any of this.  She denies any complaints.  She is on anticoagulation and her blood sugar was 75.     The history is provided by the patient and a relative.  Altered Mental Status  Presenting symptoms: confusion, disorientation, lethargy and partial responsiveness   Severity:  Unable to specify Most recent episode:  Today Episode history:  Single Progression:  Improving Chronicity:  New Context: not head injury   Associated symptoms: fever and vomiting   Associated symptoms: no abdominal pain, no difficulty breathing, no headaches and no rash   Fever:    Max temp PTA:  101.6   Temp source:  Oral   Past Medical History:  Diagnosis Date  . A-fib ([place])   . Anemia   . Arthritis    right knee  . Cataract   . CHF (congestive heart failure) ([place])   . CKD (chronic kidney disease), stage III ([place])   . Dehydration   . Diabetes mellitus   . Gout   . Headache(784.0)   . Hypertension   . UTI (lower urinary tract infection)     Patient Active Problem List   Diagnosis Date Noted  . Mobitz type 2 second degree atrioventricular block   . Metabolic acidosis 96/29/5284  . Open wound of finger, infected 01/22/2019  . Sepsis ([place]) 01/22/2019  . Acute encephalopathy 02/28/2016  . Lower urinary tract infectious disease   . Generalized weakness  11/11/2015  . Acute renal failure superimposed on stage 3 chronic kidney disease ([place]) 11/11/2015  . Hyperkalemia 11/11/2015  . A-fib ([place]) 11/11/2015  . Hypokalemia 11/11/2015  . Gout   . UTI (urinary tract infection) 07/23/2014  . Acute on chronic diastolic heart failure ([place]) 07/22/2014  . Atrial fibrillation, chronic ([place]) 07/22/2014  . Diabetes mellitus type 2, controlled ([place]) 07/22/2014  . Leg pain, bilateral 07/22/2014  . SUI (stress urinary incontinence, female) 05/16/2011  . Pelvic relaxation 05/16/2011  . ANEMIA 11/03/2009  . Essential hypertension 11/03/2009  . PREMATURE VENTRICULAR CONTRACTIONS 11/03/2009  . DYSPNEA 11/03/2009    Past Surgical History:  Procedure Laterality Date  . ABDOMINAL HYSTERECTOMY    . AMPUTATION FINGER Left 01/22/2019   Procedure: Amputation Left Index Finger First Joint;  Surgeon: Iran Planas, MD;  Location: [facility];  Service: Orthopedics;  Laterality: Left;  . ANTERIOR AND POSTERIOR REPAIR  05/17/2011   Procedure: ANTERIOR (CYSTOCELE) AND POSTERIOR REPAIR (RECTOCELE);  Surgeon: Eli Hose, MD;  Location: [facility];  Service: Gynecology;  Laterality: N/A;  Anterior repair with TVT bladder sling and cystoscopy  . BLADDER SUSPENSION  05/17/2011   Procedure: TRANSVAGINAL TAPE (TVT) PROCEDURE;  Surgeon: Eli Hose, MD;  Location: [facility];  Service: Gynecology;  Laterality: N/A;  . BREAST SURGERY     breast biopsy  .  I&D EXTREMITY Left 01/22/2019   Procedure: IRRIGATION AND DEBRIDEMENT OF LEFT INDEX FINGER,;  Surgeon: Iran Planas, MD;  Location: [facility];  Service: Orthopedics;  Laterality: Left;     OB History   No obstetric history on file.      Home Medications    Prior to Admission medications   Medication Sig Start Date End Date Taking? Authorizing Provider  acetaminophen (TYLENOL) 500 MG tablet Take 500 mg by mouth every 6 (six) hours as needed for fever.    [provider]  amLODipine (NORVASC) 2.5 MG tablet Take 1  tablet (2.5 mg total) by mouth daily. 01/26/19   Raiford Noble Latif, DO  apixaban (ELIQUIS) 2.5 MG TABS tablet Take 1 tablet (2.5 mg total) by mouth 2 (two) times daily. 01/25/19   Raiford Noble Latif, DO  cephALEXin (KEFLEX) 250 MG capsule Take 1 capsule (250 mg total) by mouth 3 (three) times daily. 01/18/19   Virgel Manifold, MD  colchicine 0.6 MG tablet Take 0.5 tablets (0.3 mg total) by mouth daily. 01/26/19   Raiford Noble Latif, DO  ferrous fumarate (HEMOCYTE - 106 MG FE) 325 (106 FE) MG TABS tablet Take 1 tablet by mouth every morning.     [provider]  furosemide (LASIX) 20 MG tablet Take 1 tablet (20 mg total) by mouth every Monday, Wednesday, and Friday. DISCUSS WITH CARDIOLOGY PRIOR to RESUMING 01/27/19   Raiford Noble Latif, DO  HYDROcodone-acetaminophen (NORCO/VICODIN) 5-325 MG tablet Take 1-2 tablets by mouth every 6 (six) hours as needed. 01/18/19   Virgel Manifold, MD  olopatadine (PATANOL) 0.1 % ophthalmic solution Place 1 drop into both eyes 2 (two) times daily. 01/25/19   Kerney Elbe, DO    Family History History reviewed. No pertinent family history.  Social History Social History   Tobacco Use  . Smoking status: Never Smoker  . Smokeless tobacco: Never Used  Substance Use Topics  . Alcohol use: No  . Drug use: No     Allergies   Lactose intolerance (gi)   Review of Systems Review of Systems  Unable to perform ROS: Mental status change  Constitutional: Positive for fever.  HENT: Negative for sore throat.   Respiratory: Negative for shortness of breath.   Cardiovascular: Negative for chest pain.  Gastrointestinal: Positive for diarrhea and vomiting. Negative for abdominal pain.  Skin: Negative for rash.  Neurological: Negative for headaches.  Psychiatric/Behavioral: Positive for confusion.     Physical Exam Updated Vital Signs BP (!) 128/58 (BP Location: Left Arm)   Pulse 72   Temp (!) 101.6 F (38.7 C) (Oral)   Resp 18   Ht '5\' 6"'   (1.676 m)   Wt 70 kg   SpO2 97%   BMI 24.91 kg/m   Physical Exam Vitals signs and nursing note reviewed. Exam conducted with a chaperone present.  Constitutional:      General: She is not in acute distress.    Appearance: She is well-developed.  HENT:     Head: Normocephalic and atraumatic.  Eyes:     Conjunctiva/sclera: Conjunctivae normal.  Neck:     Musculoskeletal: Neck supple.  Cardiovascular:     Rate and Rhythm: Normal rate and regular rhythm.     Pulses: Normal pulses.     Heart sounds: No murmur.  Pulmonary:     Effort: Pulmonary effort is normal. No respiratory distress.     Breath sounds: Normal breath sounds.  Abdominal:     Palpations: Abdomen is soft.  Tenderness: There is no abdominal tenderness. There is no guarding or rebound.  Genitourinary:    Rectum: Normal. No mass or tenderness.  Musculoskeletal: Normal range of motion.     Right lower leg: Edema present.     Left lower leg: Edema present.  Skin:    General: Skin is warm and dry.     Capillary Refill: Capillary refill takes less than 2 seconds.     Findings: No rash.  Neurological:     General: No focal deficit present.     Mental Status: She is alert. She is disoriented.     Comments: She is awake and alert.  She is knows she is in a hospital but she does not know where and she does not know the day or date.  She is following commands and moving all extremities.  She is answering questions, but has no recall of recent events.      ED Treatments / Results  Labs (all labs ordered are listed, but only abnormal results are displayed) Labs Reviewed  BLOOD CULTURE ID PANEL (REFLEXED) - Abnormal; Notable for the following components:      Result Value   Enterobacteriaceae species DETECTED (*)    Escherichia coli DETECTED (*)    All other components within normal limits  COMPREHENSIVE METABOLIC PANEL - Abnormal; Notable for the following components:   BUN 45 (*)    Creatinine, Ser 1.57 (*)     GFR calc non Af Amer 30 (*)    GFR calc Af Amer 34 (*)    All other components within normal limits  LACTIC ACID, PLASMA - Abnormal; Notable for the following components:   Lactic Acid, Venous 2.5 (*)    All other components within normal limits  CBC WITH DIFFERENTIAL/PLATELET - Abnormal; Notable for the following components:   RBC 3.25 (*)    Hemoglobin 9.2 (*)    HCT 29.5 (*)    Lymphs Abs 0.2 (*)    All other components within normal limits  PROTIME-INR - Abnormal; Notable for the following components:   Prothrombin Time 17.6 (*)    INR 1.5 (*)    All other components within normal limits  URINALYSIS, ROUTINE W REFLEX MICROSCOPIC - Abnormal; Notable for the following components:   APPearance HAZY (*)    Hgb urine dipstick MODERATE (*)    Protein, ur 30 (*)    Leukocytes,Ua MODERATE (*)    Bacteria, UA MANY (*)    All other components within normal limits  TROPONIN I - Abnormal; Notable for the following components:   Troponin I 0.06 (*)    All other components within normal limits  BRAIN NATRIURETIC PEPTIDE - Abnormal; Notable for the following components:   B Natriuretic Peptide 736.3 (*)    All other components within normal limits  MAGNESIUM - Abnormal; Notable for the following components:   Magnesium 1.6 (*)    All other components within normal limits  BASIC METABOLIC PANEL - Abnormal; Notable for the following components:   BUN 39 (*)    Creatinine, Ser 1.44 (*)    GFR calc non Af Amer 33 (*)    GFR calc Af Amer 38 (*)    All other components within normal limits  CBC - Abnormal; Notable for the following components:   WBC 16.7 (*)    RBC 2.97 (*)    Hemoglobin 8.5 (*)    HCT 27.0 (*)    All other components within normal limits  TROPONIN I -  Abnormal; Notable for the following components:   Troponin I 0.11 (*)    All other components within normal limits  CULTURE, BLOOD (ROUTINE X 2)  CULTURE, BLOOD (ROUTINE X 2)  SARS CORONAVIRUS 2 (HOSPITAL ORDER,  [facility])  URINE CULTURE  LACTIC ACID, PLASMA  LIPASE, BLOOD  PHOSPHORUS  GLUCOSE, CAPILLARY  GLUCOSE, CAPILLARY  GLUCOSE, CAPILLARY  GLUCOSE, CAPILLARY  POC OCCULT BLOOD, ED  SAMPLE TO BLOOD BANK  TYPE AND SCREEN  ABO/RH    EKG EKG Interpretation  Date/Time:  Tuesday Mar 25 2019 18:42:58 EDT Ventricular Rate:  76 PR Interval:    QRS Duration: 82 QT Interval:  443 QTC Calculation: 499 R Axis:   -5 Text Interpretation:  Sinus rhythm Probable left atrial enlargement Probable anteroseptal infarct, old sinus replacing AV block on prior 3/20 Confirmed by Aletta Edouard [phone]) on 03/25/2019 6:44:32 PM   Radiology Ct Head Wo Contrast  Result Date: 03/25/2019 CLINICAL DATA:  Decreased level of consciousness EXAM: CT HEAD WITHOUT CONTRAST TECHNIQUE: Contiguous axial images were obtained from the base of the skull through the vertex without intravenous contrast. COMPARISON:  02/29/2016 FINDINGS: Brain: Chronic atrophic and ischemic changes are noted. No findings to suggest acute hemorrhage, acute infarction or space-occupying mass lesion are noted. Vascular: No hyperdense vessel or unexpected calcification. Skull: Normal. Negative for fracture or focal lesion. Sinuses/Orbits: No acute finding. Other: None. IMPRESSION: Chronic atrophic and ischemic changes without acute abnormality. Electronically Signed   By: Inez Catalina M.D.   On: 03/25/2019 21:30   Ct Abdomen Pelvis W Contrast  Result Date: 03/25/2019 CLINICAL DATA:  85 year old female with history of decreased responsiveness. Febrile patient. Confusion. Black tarry stools. Vomiting. EXAM: CT ABDOMEN AND PELVIS WITH CONTRAST TECHNIQUE: Multidetector CT imaging of the abdomen and pelvis was performed using the standard protocol following bolus administration of intravenous contrast. CONTRAST:  149m OMNIPAQUE IOHEXOL 300 MG/ML  SOLN COMPARISON:  No priors. FINDINGS: Lower chest: Small right Bochdalek's hernia.   Mild cardiomegaly. Hepatobiliary: No suspicious cystic or solid hepatic lesions. No intra or extrahepatic biliary ductal dilatation. Small amount of high attenuation material lying dependently in the gallbladder which may reflect biliary sludge and/or tiny gallstones. No findings to suggest an acute cholecystitis at this time. Pancreas: No pancreatic mass. No pancreatic ductal dilatation. No pancreatic or peripancreatic fluid or inflammatory changes. Spleen: Unremarkable. Adrenals/Urinary Tract: In the upper pole of the right kidney there is a 1.8 cm low-attenuation lesion, compatible with a simple cyst. Left kidney and bilateral adrenal glands are normal in appearance. Mild left hydroureteronephrosis which terminates in the proximal third of the left ureter. No right hydroureteronephrosis. Small amount of gas non dependently in the lumen of the urinary bladder. Urinary bladder is otherwise unremarkable in appearance. Stomach/Bowel: Normal appearance of the stomach. No pathologic dilatation of small bowel or colon. Numerous colonic diverticulae are noted, without surrounding inflammatory changes to suggest an acute diverticulitis at this time. Normal appendix. Vascular/Lymphatic: Aortic atherosclerosis, without evidence of aneurysm or dissection in the abdominal or pelvic vasculature. No lymphadenopathy noted in the abdomen or pelvis. Reproductive: Status post hysterectomy. Ovaries are not confidently identified may be surgically absent or atrophic Other: No significant volume of ascites.  No pneumoperitoneum. Musculoskeletal: There are no aggressive appearing lytic or blastic lesions noted in the visualized portions of the skeleton. IMPRESSION: 1. Extensive colonic diverticulosis without evidence to suggest an acute diverticulitis at this time. 2. Mild left hydroureteronephrosis terminating in the proximal third of the left  ureter. No obstructing stone identified. The possibility of a ureteral stricture could be  considered. 3. Small amount of gas non dependently in the urinary bladder. This may be iatrogenic if there has been recent catheterization. In the absence of catheterization, correlation with urinalysis is suggested to exclude the possibility of urinary tract infection with gas-forming organisms. 4. Aortic atherosclerosis. 5. Mild cardiomegaly. 6. Trace amount of biliary sludge and/or tiny gallstones lying dependently in the gallbladder. No findings to suggest an acute cholecystitis at this time. Electronically Signed   By: Vinnie Langton M.D.   On: 03/25/2019 21:45   Dg Chest Port 1 View  Result Date: 03/25/2019 CLINICAL DATA:  Fever. EXAM: PORTABLE CHEST 1 VIEW COMPARISON:  Chest x-ray dated July 21, 2014. FINDINGS: Unchanged mild cardiomegaly. Normal mediastinal contours. Normal pulmonary vascularity. No focal consolidation, pleural effusion, or pneumothorax. No acute osseous abnormality. IMPRESSION: No active disease. Electronically Signed   By: Titus Dubin M.D.   On: 03/25/2019 19:13    Procedures Procedures (including critical care time)  Medications Ordered in ED Medications  acetaminophen (TYLENOL) tablet 650 mg (has no administration in time range)  sodium chloride 0.9 % bolus 500 mL (has no administration in time range)     Initial Impression / Assessment and Plan / ED Course  I have reviewed the triage vital signs and the nursing notes.  Pertinent labs & imaging results that were available during my care of the patient were reviewed by me and considered in my medical decision making (see chart for details).  Clinical Course as of Mar 25 1616  Tue Mar 25, 2019  1846 Differential diagnosis includes sepsis, metabolic encephalopathy, stroke, Covid, pneumonia, UTI, metabolic derangement, GI bleed   [MB]  1905 Met with the patient's son Ron who works here.  He said that he was alerted by the CNA that the patient was less responsive and staring.  By the time she got there she  was not oriented to self and then started vomiting and having diarrhea.  He said usually she is alert and oriented and will sometimes get confused at baseline   [MB]  2219 Discussed with Dr. Marthenia Rolling from the hospitalist service who will evaluate the patient for admission.   [MB]    Clinical Course User Index [MB] Hayden Rasmussen, MD   [name] was evaluated in Emergency Department on 03/25/2019 for the symptoms described in the history of present illness. She was evaluated in the context of the global COVID-19 pandemic, which necessitated consideration that the patient might be at risk for infection with the SARS-CoV-2 virus that causes COVID-19. Institutional protocols and algorithms that pertain to the evaluation of patients at risk for COVID-19 are in a state of rapid change based on information released by regulatory bodies including the CDC and federal and state organizations. These policies and algorithms were followed during the patient's care in the ED.      Final Clinical Impressions(s) / ED Diagnoses   Final diagnoses:  Confusion  Nausea vomiting and diarrhea  Lower urinary tract infectious disease    ED Discharge Orders    None       Hayden Rasmussen, MD 03/26/19 [phone]

## 2019-03-25 NOTE — ED Notes (Addendum)
Nurse Navigator communication: Family is aware of pt status.

## 2019-03-25 NOTE — ED Triage Notes (Signed)
Pt arrives POV w/ son and grandson stating. Pt was at home w/ CNA and CNA went to do some laundry. On returning to the patient, the CNA noted pt to be difficult to arouse w/ decreased responsiveness. She called pts son who is RN here and he went home, found her to be altered and activated 911. On 911 arrival, pt was febrile, therefore [facility] stated they could not transport across [employer]. Pt is alert and interactive in triage, confused, family reports more so than usual. Son describes some black tarry stools and 4-5 episodes of projective nonbloody, nonbilious vomiting. Pt is febrile here, denies known sick contacts

## 2019-03-26 ENCOUNTER — Other Ambulatory Visit: Payer: Self-pay

## 2019-03-26 DIAGNOSIS — Z7401 Bed confinement status: Secondary | ICD-10-CM | POA: Diagnosis not present

## 2019-03-26 DIAGNOSIS — Z20828 Contact with and (suspected) exposure to other viral communicable diseases: Secondary | ICD-10-CM | POA: Diagnosis present

## 2019-03-26 DIAGNOSIS — I10 Essential (primary) hypertension: Secondary | ICD-10-CM | POA: Diagnosis not present

## 2019-03-26 DIAGNOSIS — R001 Bradycardia, unspecified: Secondary | ICD-10-CM | POA: Diagnosis not present

## 2019-03-26 DIAGNOSIS — I517 Cardiomegaly: Secondary | ICD-10-CM | POA: Diagnosis present

## 2019-03-26 DIAGNOSIS — N183 Chronic kidney disease, stage 3 unspecified: Secondary | ICD-10-CM | POA: Diagnosis present

## 2019-03-26 DIAGNOSIS — G9341 Metabolic encephalopathy: Secondary | ICD-10-CM | POA: Diagnosis not present

## 2019-03-26 DIAGNOSIS — N1330 Unspecified hydronephrosis: Secondary | ICD-10-CM | POA: Diagnosis not present

## 2019-03-26 DIAGNOSIS — Z741 Need for assistance with personal care: Secondary | ICD-10-CM | POA: Diagnosis not present

## 2019-03-26 DIAGNOSIS — G9340 Encephalopathy, unspecified: Secondary | ICD-10-CM | POA: Diagnosis not present

## 2019-03-26 DIAGNOSIS — R29898 Other symptoms and signs involving the musculoskeletal system: Secondary | ICD-10-CM | POA: Diagnosis not present

## 2019-03-26 DIAGNOSIS — R778 Other specified abnormalities of plasma proteins: Secondary | ICD-10-CM | POA: Diagnosis present

## 2019-03-26 DIAGNOSIS — M10031 Idiopathic gout, right wrist: Secondary | ICD-10-CM | POA: Diagnosis not present

## 2019-03-26 DIAGNOSIS — S68111D Complete traumatic metacarpophalangeal amputation of left index finger, subsequent encounter: Secondary | ICD-10-CM | POA: Diagnosis not present

## 2019-03-26 DIAGNOSIS — A4151 Sepsis due to Escherichia coli [E. coli]: Secondary | ICD-10-CM | POA: Diagnosis not present

## 2019-03-26 DIAGNOSIS — Z89022 Acquired absence of left finger(s): Secondary | ICD-10-CM | POA: Diagnosis not present

## 2019-03-26 DIAGNOSIS — M1811 Unilateral primary osteoarthritis of first carpometacarpal joint, right hand: Secondary | ICD-10-CM | POA: Diagnosis not present

## 2019-03-26 DIAGNOSIS — I700 Atherosclerosis of aorta: Secondary | ICD-10-CM | POA: Diagnosis present

## 2019-03-26 DIAGNOSIS — E875 Hyperkalemia: Secondary | ICD-10-CM | POA: Diagnosis not present

## 2019-03-26 DIAGNOSIS — M109 Gout, unspecified: Secondary | ICD-10-CM | POA: Diagnosis present

## 2019-03-26 DIAGNOSIS — B961 Klebsiella pneumoniae [K. pneumoniae] as the cause of diseases classified elsewhere: Secondary | ICD-10-CM | POA: Diagnosis not present

## 2019-03-26 DIAGNOSIS — B9689 Other specified bacterial agents as the cause of diseases classified elsewhere: Secondary | ICD-10-CM | POA: Diagnosis present

## 2019-03-26 DIAGNOSIS — I440 Atrioventricular block, first degree: Secondary | ICD-10-CM | POA: Diagnosis present

## 2019-03-26 DIAGNOSIS — R262 Difficulty in walking, not elsewhere classified: Secondary | ICD-10-CM | POA: Diagnosis not present

## 2019-03-26 DIAGNOSIS — G92 Toxic encephalopathy: Secondary | ICD-10-CM | POA: Diagnosis not present

## 2019-03-26 DIAGNOSIS — I5032 Chronic diastolic (congestive) heart failure: Secondary | ICD-10-CM | POA: Diagnosis not present

## 2019-03-26 DIAGNOSIS — M19031 Primary osteoarthritis, right wrist: Secondary | ICD-10-CM | POA: Diagnosis present

## 2019-03-26 DIAGNOSIS — R609 Edema, unspecified: Secondary | ICD-10-CM | POA: Diagnosis not present

## 2019-03-26 DIAGNOSIS — R410 Disorientation, unspecified: Secondary | ICD-10-CM | POA: Diagnosis not present

## 2019-03-26 DIAGNOSIS — N390 Urinary tract infection, site not specified: Secondary | ICD-10-CM | POA: Diagnosis not present

## 2019-03-26 DIAGNOSIS — I482 Chronic atrial fibrillation, unspecified: Secondary | ICD-10-CM | POA: Diagnosis not present

## 2019-03-26 DIAGNOSIS — E1122 Type 2 diabetes mellitus with diabetic chronic kidney disease: Secondary | ICD-10-CM | POA: Diagnosis present

## 2019-03-26 DIAGNOSIS — I959 Hypotension, unspecified: Secondary | ICD-10-CM | POA: Diagnosis not present

## 2019-03-26 DIAGNOSIS — M25531 Pain in right wrist: Secondary | ICD-10-CM | POA: Diagnosis not present

## 2019-03-26 DIAGNOSIS — R7881 Bacteremia: Secondary | ICD-10-CM | POA: Diagnosis not present

## 2019-03-26 DIAGNOSIS — A4189 Other specified sepsis: Secondary | ICD-10-CM | POA: Diagnosis present

## 2019-03-26 DIAGNOSIS — Z90710 Acquired absence of both cervix and uterus: Secondary | ICD-10-CM | POA: Diagnosis not present

## 2019-03-26 DIAGNOSIS — B9620 Unspecified Escherichia coli [E. coli] as the cause of diseases classified elsewhere: Secondary | ICD-10-CM | POA: Diagnosis not present

## 2019-03-26 DIAGNOSIS — E119 Type 2 diabetes mellitus without complications: Secondary | ICD-10-CM | POA: Diagnosis not present

## 2019-03-26 DIAGNOSIS — Z7901 Long term (current) use of anticoagulants: Secondary | ICD-10-CM | POA: Diagnosis not present

## 2019-03-26 DIAGNOSIS — M2550 Pain in unspecified joint: Secondary | ICD-10-CM | POA: Diagnosis not present

## 2019-03-26 DIAGNOSIS — M6281 Muscle weakness (generalized): Secondary | ICD-10-CM | POA: Diagnosis not present

## 2019-03-26 DIAGNOSIS — I130 Hypertensive heart and chronic kidney disease with heart failure and stage 1 through stage 4 chronic kidney disease, or unspecified chronic kidney disease: Secondary | ICD-10-CM | POA: Diagnosis not present

## 2019-03-26 DIAGNOSIS — Z794 Long term (current) use of insulin: Secondary | ICD-10-CM | POA: Diagnosis not present

## 2019-03-26 DIAGNOSIS — R4182 Altered mental status, unspecified: Secondary | ICD-10-CM | POA: Diagnosis present

## 2019-03-26 DIAGNOSIS — I248 Other forms of acute ischemic heart disease: Secondary | ICD-10-CM | POA: Diagnosis present

## 2019-03-26 DIAGNOSIS — D631 Anemia in chronic kidney disease: Secondary | ICD-10-CM | POA: Diagnosis not present

## 2019-03-26 DIAGNOSIS — N136 Pyonephrosis: Secondary | ICD-10-CM | POA: Diagnosis not present

## 2019-03-26 DIAGNOSIS — E739 Lactose intolerance, unspecified: Secondary | ICD-10-CM | POA: Diagnosis present

## 2019-03-26 LAB — BLOOD CULTURE ID PANEL (REFLEXED)

## 2019-03-26 LAB — GLUCOSE, CAPILLARY
Glucose-Capillary: 75 mg/dL (ref 70–99)
Glucose-Capillary: 79 mg/dL (ref 70–99)
Glucose-Capillary: 88 mg/dL (ref 70–99)
Glucose-Capillary: 81 mg/dL (ref 70–99)
Glucose-Capillary: 119 mg/dL — ABNORMAL HIGH (ref 70–99)
Glucose-Capillary: 108 mg/dL — ABNORMAL HIGH (ref 70–99)

## 2019-03-26 LAB — CBC
HCT: 27 % — ABNORMAL LOW (ref 36.0–46.0)
Hemoglobin: 8.5 g/dL — ABNORMAL LOW (ref 12.0–15.0)
MCH: 28.6 pg (ref 26.0–34.0)
MCHC: 31.5 g/dL (ref 30.0–36.0)
MCV: 90.9 fL (ref 80.0–100.0)
Platelets: 163 10*3/uL (ref 150–400)
RBC: 2.97 MIL/uL — ABNORMAL LOW (ref 3.87–5.11)
RDW: 14.6 % (ref 11.5–15.5)
WBC: 16.7 10*3/uL — ABNORMAL HIGH (ref 4.0–10.5)
nRBC: 0 % (ref 0.0–0.2)

## 2019-03-26 LAB — LACTIC ACID, PLASMA: Lactic Acid, Venous: 1.9 mmol/L (ref 0.5–1.9)

## 2019-03-26 LAB — BASIC METABOLIC PANEL
Anion gap: 12 (ref 5–15)
BUN: 39 mg/dL — ABNORMAL HIGH (ref 8–23)
CO2: 23 mmol/L (ref 22–32)
Calcium: 9.1 mg/dL (ref 8.9–10.3)
Chloride: 109 mmol/L (ref 98–111)
Creatinine, Ser: 1.44 mg/dL — ABNORMAL HIGH (ref 0.44–1.00)
GFR calc Af Amer: 38 mL/min — ABNORMAL LOW (ref >60)
GFR calc non Af Amer: 33 mL/min — ABNORMAL LOW (ref >60)
Glucose, Bld: 90 mg/dL (ref 70–99)
Potassium: 4 mmol/L (ref 3.5–5.1)
Sodium: 144 mmol/L (ref 135–145)

## 2019-03-26 LAB — MAGNESIUM: Magnesium: 1.6 mg/dL — ABNORMAL LOW (ref 1.7–2.4)

## 2019-03-26 LAB — PHOSPHORUS: Phosphorus: 3 mg/dL (ref 2.5–4.6)

## 2019-03-26 LAB — BRAIN NATRIURETIC PEPTIDE: B Natriuretic Peptide: 736.3 pg/mL — ABNORMAL HIGH (ref 0.0–100.0)

## 2019-03-26 LAB — TROPONIN I: Troponin I: 0.11 ng/mL (ref <0.03)

## 2019-03-26 MED ORDER — SODIUM CHLORIDE 0.9 % IV SOLN
2.0000 g | INTRAVENOUS | Status: DC
  Administered 2019-03-26 – 2019-03-27 (×2): 2 g via INTRAVENOUS
  Filled 2019-03-26 (×4): qty 20

## 2019-03-26 MED ORDER — APIXABAN 2.5 MG PO TABS
2.5000 mg | ORAL_TABLET | Freq: Two times a day (BID) | ORAL | Status: DC
  Administered 2019-03-26 – 2019-04-02 (×17): 2.5 mg via ORAL
  Filled 2019-03-26 (×18): qty 1

## 2019-03-26 MED ORDER — INSULIN ASPART 100 UNIT/ML ~~LOC~~ SOLN
0.0000 [IU] | Freq: Every day | SUBCUTANEOUS | Status: DC
  Administered 2019-03-30: 2 [IU] via SUBCUTANEOUS

## 2019-03-26 MED ORDER — MAGNESIUM SULFATE 2 GM/50ML IV SOLN
2.0000 g | Freq: Once | INTRAVENOUS | Status: AC
  Administered 2019-03-26: 2 g via INTRAVENOUS
  Filled 2019-03-26: qty 50

## 2019-03-26 MED ORDER — OLOPATADINE HCL 0.1 % OP SOLN
1.0000 [drp] | Freq: Two times a day (BID) | OPHTHALMIC | Status: DC
  Administered 2019-03-26 – 2019-04-07 (×25): 1 [drp] via OPHTHALMIC
  Filled 2019-03-26: qty 5

## 2019-03-26 MED ORDER — AMLODIPINE BESYLATE 2.5 MG PO TABS
2.5000 mg | ORAL_TABLET | Freq: Every day | ORAL | Status: DC
  Administered 2019-03-26 – 2019-04-07 (×13): 2.5 mg via ORAL
  Filled 2019-03-26 (×13): qty 1

## 2019-03-26 MED ORDER — FERROUS FUMARATE 324 (106 FE) MG PO TABS
106.0000 mg | ORAL_TABLET | Freq: Every day | ORAL | Status: DC
  Administered 2019-03-26 – 2019-04-07 (×13): 106 mg via ORAL
  Filled 2019-03-26 (×14): qty 1

## 2019-03-26 MED ORDER — INSULIN ASPART 100 UNIT/ML ~~LOC~~ SOLN
0.0000 [IU] | Freq: Three times a day (TID) | SUBCUTANEOUS | Status: DC
  Administered 2019-03-27: 2 [IU] via SUBCUTANEOUS
  Administered 2019-03-28: 1 [IU] via SUBCUTANEOUS
  Administered 2019-03-30: 2 [IU] via SUBCUTANEOUS
  Administered 2019-03-31: 07:00:00 1 [IU] via SUBCUTANEOUS
  Administered 2019-03-31: 2 [IU] via SUBCUTANEOUS
  Administered 2019-03-31 – 2019-04-01 (×2): 1 [IU] via SUBCUTANEOUS
  Administered 2019-04-01: 2 [IU] via SUBCUTANEOUS
  Administered 2019-04-02 – 2019-04-06 (×4): 1 [IU] via SUBCUTANEOUS

## 2019-03-26 MED ORDER — FUROSEMIDE 20 MG PO TABS
20.0000 mg | ORAL_TABLET | ORAL | Status: DC
  Administered 2019-03-26 – 2019-03-31 (×3): 20 mg via ORAL
  Filled 2019-03-26 (×3): qty 1

## 2019-03-26 MED ORDER — SODIUM CHLORIDE 0.9 % IV SOLN
INTRAVENOUS | Status: DC | PRN
  Administered 2019-03-26 – 2019-04-06 (×6): 250 mL via INTRAVENOUS

## 2019-03-26 MED ORDER — ATROPINE SULFATE 1 MG/10ML IJ SOSY: 0.5000 mg | PREFILLED_SYRINGE | INTRAMUSCULAR | Status: DC | PRN

## 2019-03-26 MED ORDER — SODIUM CHLORIDE 0.9 % IV SOLN: 1.0000 g | INTRAVENOUS | Status: DC

## 2019-03-26 NOTE — TOC Initial Note (Addendum)
Transition of Care [facility]) - Initial/Assessment Note    Patient Details  Name: Courtney Bradford MRN: [id number] Date of Birth: 04-01-1931  [facility] CM/SW Contact:    Royston Bake, RN Phone Number: 03/26/2019, 3:35 PM  Clinical Narrative:                 Patient is known to me from previous admission; CM talked to patient's son Ron / Building control surveyor; patient has 24 hr care at home; 2 nurses aides 7 days a week; no DME needed; Ron stated that she does not need anything at discharge; DCP- return home. Patient is active with [facility] as prior to admission per [place] with [facility].  Expected Discharge Plan: Home/Self Care Barriers to Discharge: No Barriers Identified   Patient Goals and CMS Choice Patient states their goals for this hospitalization and ongoing recovery are:: to go home CMS Medicare.gov Compare Post Acute Care list provided to:: Patient Represenative (must comment)(son Ron) Choice offered to / list presented to : Adult Children  Expected Discharge Plan and Services Expected Discharge Plan: Home/Self Care In-house Referral: NA Discharge Planning Services: CM Consult Post Acute Care Choice: NA Living arrangements for the past 2 months: Single Family Home                 DME Arranged: N/A DME Agency: NA                  Prior Living Arrangements/Services Living arrangements for the past 2 months: Single Family Home Lives with:: Adult Children Patient language and need for interpreter reviewed:: Yes Do you feel safe going back to the place where you live?: Yes      Need for Family Participation in Patient Care: Yes (Comment) Care giver support system in place?: Yes (comment) Current home services: Homehealth aide Criminal Activity/Legal Involvement Pertinent to Current Situation/Hospitalization: No - Comment as needed  Activities of Daily Living Home Assistive Devices/Equipment: Shower chair with back, Environmental consultant (specify type), Cane  (specify quad or straight) ADL Screening (condition at time of admission) Patient's cognitive ability adequate to safely complete daily activities?: Yes Is the patient deaf or have difficulty hearing?: No Does the patient have difficulty seeing, even when wearing glasses/contacts?: No Does the patient have difficulty concentrating, remembering, or making decisions?: No Patient able to express need for assistance with ADLs?: Yes Does the patient have difficulty dressing or bathing?: Yes Independently performs ADLs?: No Does the patient have difficulty walking or climbing stairs?: Yes Weakness of Legs: Both Weakness of Arms/Hands: None  Permission Sought/Granted Permission sought to share information with : Case Manager Permission granted to share information with : Yes, Verbal Permission Granted              Emotional Assessment         Alcohol / Substance Use: Not Applicable Psych Involvement: No (comment)  Admission diagnosis:  Lower urinary tract infectious disease [N39.0] Confusion [R41.0] Nausea vomiting and diarrhea [R11.2, R19.7] Patient Active Problem List   Diagnosis Date Noted  . CKD (chronic kidney disease), stage III ([place]) 03/26/2019  . Hypomagnesemia 03/26/2019  . Hydroureteronephrosis 03/26/2019  . Elevated troponin 03/26/2019  . Demand ischemia ([place]) 03/26/2019  . Sinus bradycardia 03/26/2019  . First degree AV block 03/26/2019  . Aortic atherosclerosis ([place]) 03/26/2019  . Mild cardiomegaly 03/26/2019  . Mobitz type 2 second degree atrioventricular block   . Acute encephalopathy 02/28/2016  . Lower urinary tract infectious disease   .  Generalized weakness 11/11/2015  . Gout   . Chronic diastolic CHF (congestive heart failure) ([place]) 07/22/2014  . Atrial fibrillation, chronic ([place]) 07/22/2014  . Type 2 diabetes, controlled, with renal manifestation ([place]) 07/22/2014  . SUI (stress urinary incontinence, female) 05/16/2011  . Pelvic relaxation 05/16/2011   . Essential hypertension 11/03/2009  . PREMATURE VENTRICULAR CONTRACTIONS 11/03/2009   PCP:  Iona Beard, MD Pharmacy:   [facility] [address] [place] [address] Phone: [phone] Fax: [phone]     Social Determinants of Health (SDOH) Interventions    Readmission Risk Interventions No flowsheet data found.

## 2019-03-26 NOTE — Progress Notes (Signed)
CCMD notified RN of 2.21 second pause and brady of 39.  Patient asymptomatic.  Patient heart rate ranging in 40s to 50s now.  RN notified triad.

## 2019-03-26 NOTE — Evaluation (Signed)
Physical Therapy Evaluation Patient Details Name: Courtney Bradford MRN: [id number] DOB: [birth year] Today's Date: 03/26/2019   History of Present Illness  Pt is an 85 y/o female admitted secondary to AMS. Found to have encephalopathy likely secondary to UTI. CT of head was negative for acute abnormality. Pt with elevated troponin, however, per notes, likely secondary to demand ischemia. PMH includes HTN, dHCF, a fib, DM, CKD, HTN, and gout.   Clinical Impression  Pt admitted secondary to problem above with deficits below. Pt with cognitive deficits, however, unsure of baseline. Required min to mod A for mobility tasks using RW this session. Per case management notes, pt has 24/7 assist from son and caregivers at home and plan is to return home. Recommend HHPT at d/c to increase independence and safety with mobility. Will continue to follow acutely.    Follow Up Recommendations Supervision/Assistance - 24 hour;Home health PT    Equipment Recommendations  None recommended by PT    Recommendations for Other Services       Precautions / Restrictions Precautions Precautions: Fall Restrictions Weight Bearing Restrictions: No      Mobility  Bed Mobility               General bed mobility comments: In chair upon entry.   Transfers Overall transfer level: Needs assistance Equipment used: Rolling walker (2 wheeled) Transfers: Sit to/from Stand Sit to Stand: Mod assist         General transfer comment: Mod A for lift assist and steadying. Cues for safe hand placement.   Ambulation/Gait Ambulation/Gait assistance: Min assist Gait Distance (Feet): 15 Feet Assistive device: Rolling walker (2 wheeled) Gait Pattern/deviations: Step-through pattern;Decreased stride length;Trunk flexed Gait velocity: Decreased    General Gait Details: Slow, unsteady gait. Required min A for steadying assist. Pt with very flexed posture and required continuous cues for upright posture.   Stairs             Wheelchair Mobility    Modified Rankin (Stroke Patients Only)       Balance Overall balance assessment: Needs assistance Sitting-balance support: No upper extremity supported;Feet supported Sitting balance-Leahy Scale: Fair     Standing balance support: Bilateral upper extremity supported;During functional activity Standing balance-Leahy Scale: Poor Standing balance comment: Reliant on BUE support                              Pertinent Vitals/Pain Pain Assessment: No/denies pain    Home Living Family/patient expects to be discharged to:: Private residence Living Arrangements: Children Available Help at Discharge: Family;Personal care attendant Type of Home: House Home Access: Stairs to enter Entrance Stairs-Rails: None Entrance Stairs-Number of Steps: 1 Home Layout: One level Home Equipment: Environmental consultant - 2 wheels;Cane - single point Additional Comments: Pt reports she lives with her son and has a caretaker all day in the daytime.     Prior Function Level of Independence: Needs assistance   Gait / Transfers Assistance Needed: Used RW for mobility and aide would assist with mobility tasks.   ADL's / Homemaking Assistance Needed: Reports aide assisted with ADLs        Hand Dominance        Extremity/Trunk Assessment   Upper Extremity Assessment Upper Extremity Assessment: Defer to OT evaluation    Lower Extremity Assessment Lower Extremity Assessment: Generalized weakness    Cervical / Trunk Assessment Cervical / Trunk Assessment: Kyphotic  Communication   Communication: No difficulties  Cognition Arousal/Alertness: Awake/alert Behavior During Therapy: WFL for tasks assessed/performed Overall Cognitive Status: Impaired/Different from baseline Area of Impairment: Orientation;Memory;Following commands;Problem solving                 Orientation Level: Disoriented to;Time;Situation   Memory: Decreased recall of  precautions;Decreased short-term memory Following Commands: Follows one step commands with increased time     Problem Solving: Slow processing;Decreased initiation;Difficulty sequencing;Requires verbal cues;Requires tactile cues General Comments: Pt was not able to state her birthday, did not know what month or year it was, and did not know why she was in the hospital.       General Comments      Exercises     Assessment/Plan    PT Assessment Patient needs continued PT services  PT Problem List Decreased strength;Decreased balance;Decreased mobility;Decreased knowledge of use of DME;Decreased cognition;Decreased safety awareness;Decreased knowledge of precautions       PT Treatment Interventions DME instruction;Gait training;Stair training;Functional mobility training;Therapeutic activities;Therapeutic exercise;Balance training;Cognitive remediation;Patient/family education    PT Goals (Current goals can be found in the Care Plan section)  Acute Rehab PT Goals Patient Stated Goal: to go back home with my son PT Goal Formulation: With patient Time For Goal Achievement: 04/09/19 Potential to Achieve Goals: Good    Frequency Min 3X/week   Barriers to discharge        Co-evaluation               AM-PAC PT "6 Clicks" Mobility  Outcome Measure Help needed turning from your back to your side while in a flat bed without using bedrails?: A Little Help needed moving from lying on your back to sitting on the side of a flat bed without using bedrails?: A Lot Help needed moving to and from a bed to a chair (including a wheelchair)?: A Little Help needed standing up from a chair using your arms (e.g., wheelchair or bedside chair)?: A Lot Help needed to walk in hospital room?: A Little Help needed climbing 3-5 steps with a railing? : A Lot 6 Click Score: 15    End of Session Equipment Utilized During Treatment: Gait belt Activity Tolerance: Patient tolerated treatment  well Patient left: in chair;with call bell/phone within reach Nurse Communication: Mobility status PT Visit Diagnosis: Other abnormalities of gait and mobility (R26.89);Muscle weakness (generalized) (M62.81);Unsteadiness on feet (R26.81)    Time: 1660-6301 PT Time Calculation (min) (ACUTE ONLY): 14 min   Charges:   PT Evaluation $PT Eval Moderate Complexity: 1 Mod          Leighton Ruff, PT, DPT  Acute Rehabilitation Services  Pager: [phone] Office: [phone]   Rudean Hitt 03/26/2019, 4:27 PM

## 2019-03-26 NOTE — Progress Notes (Signed)
Low sustaining heart rate is normal for patient when resting per patient's son, Jori Moll.

## 2019-03-26 NOTE — Progress Notes (Addendum)
PHARMACY - PHYSICIAN COMMUNICATION CRITICAL VALUE ALERT - BLOOD CULTURE IDENTIFICATION (BCID)  Courtney Bradford is an 85 y.o. female who presented to [facility] on 03/25/2019 with a chief complaint of fever and AMS. PMH acute toxic encephalopathy 2/2 UTI. Admitted for treatment of sepsis suspected 2/2 UTI. Admit BCx 1/2 growing GNRs, BCID E.coli.  Name of physician (or Provider) Contacted: Rama  Current antibiotics: Ceftriaxone  Changes to prescribed antibiotics recommended:  Increase to ceftriaxone 2g IV q24h  Results for orders placed or performed during the hospital encounter of 03/25/19  Blood Culture ID Panel (Reflexed) (Collected: 03/25/2019  6:32 PM)  Result Value Ref Range   Enterococcus species NOT DETECTED NOT DETECTED   Listeria monocytogenes NOT DETECTED NOT DETECTED   Staphylococcus species NOT DETECTED NOT DETECTED   Staphylococcus aureus (BCID) NOT DETECTED NOT DETECTED   Streptococcus species NOT DETECTED NOT DETECTED   Streptococcus agalactiae NOT DETECTED NOT DETECTED   Streptococcus pneumoniae NOT DETECTED NOT DETECTED   Streptococcus pyogenes NOT DETECTED NOT DETECTED   Acinetobacter baumannii NOT DETECTED NOT DETECTED   Enterobacteriaceae species DETECTED (A) NOT DETECTED   Enterobacter cloacae complex NOT DETECTED NOT DETECTED   Escherichia coli DETECTED (A) NOT DETECTED   Klebsiella oxytoca NOT DETECTED NOT DETECTED   Klebsiella pneumoniae NOT DETECTED NOT DETECTED   Proteus species NOT DETECTED NOT DETECTED   Serratia marcescens NOT DETECTED NOT DETECTED   Carbapenem resistance NOT DETECTED NOT DETECTED   Haemophilus influenzae NOT DETECTED NOT DETECTED   Neisseria meningitidis NOT DETECTED NOT DETECTED   Pseudomonas aeruginosa NOT DETECTED NOT DETECTED   Candida albicans NOT DETECTED NOT DETECTED   Candida glabrata NOT DETECTED NOT DETECTED   Candida krusei NOT DETECTED NOT DETECTED   Candida parapsilosis NOT DETECTED NOT DETECTED   Candida tropicalis NOT  DETECTED NOT DETECTED   Erin N. Gerarda Fraction, PharmD, [place] PGY2 Infectious Diseases Pharmacy Resident Phone: [phone] 03/26/2019  11:40 AM

## 2019-03-26 NOTE — Progress Notes (Signed)
CCMD notified of patient brady to 51.  Patient also going into possible second degree heart block when brady.  Staying in 30s to low 40s.  RN notified [facility].

## 2019-03-26 NOTE — ED Notes (Signed)
ED TO INPATIENT HANDOFF REPORT  ED Nurse Name and Phone #: Juanito Doom  S Name/Age/Gender Courtney Bradford 85 y.o. female Room/Bed: 029C/029C  Code Status   Code Status: Prior  Home/SNF/Other Home Patient oriented to: situation Is this baseline? Yes   Triage Complete: Triage complete  Chief Complaint fever/nausea  Triage Note Pt arrives POV w/ son and grandson stating. Pt was at home w/ CNA and CNA went to do some laundry. On returning to the patient, the CNA noted pt to be difficult to arouse w/ decreased responsiveness. She called pts son who is RN here and he went home, found her to be altered and activated 911. On 911 arrival, pt was febrile, therefore [facility] stated they could not transport across [employer]. Pt is alert and interactive in triage, confused, family reports more so than usual. Son describes some black tarry stools and 4-5 episodes of projective nonbloody, nonbilious vomiting. Pt is febrile here, denies known sick contacts    Allergies Allergies  Allergen Reactions  . Lactose Intolerance (Gi) Other (See Comments)    Upset stomach    Level of Care/Admitting Diagnosis ED Disposition    ED Disposition Condition [facility]: [place] [[id number]]  Level of Care: Telemetry Medical [104]  Covid Evaluation: N/A  Diagnosis: Acute encephalopathy [[id number]]  Admitting Physician: Shirlean Mylar  Attending Physician: Dana Allan I [[id number]]  Estimated length of stay: past midnight tomorrow  Certification:: I certify this patient will need inpatient services for at least 2 midnights  PT Class (Do Not Modify): Inpatient [101]  PT Acc Code (Do Not Modify): Private [1]       B Medical/Surgery History Past Medical History:  Diagnosis Date  . A-fib ([place])   . Anemia   . Arthritis    right knee  . Cataract   . CHF (congestive heart failure) ([place])   . CKD (chronic kidney disease), stage III ([place])   .  Dehydration   . Diabetes mellitus   . Gout   . Headache(784.0)   . Hypertension   . UTI (lower urinary tract infection)    Past Surgical History:  Procedure Laterality Date  . ABDOMINAL HYSTERECTOMY    . AMPUTATION FINGER Left 01/22/2019   Procedure: Amputation Left Index Finger First Joint;  Surgeon: Iran Planas, MD;  Location: [facility];  Service: Orthopedics;  Laterality: Left;  . ANTERIOR AND POSTERIOR REPAIR  05/17/2011   Procedure: ANTERIOR (CYSTOCELE) AND POSTERIOR REPAIR (RECTOCELE);  Surgeon: Eli Hose, MD;  Location: [facility];  Service: Gynecology;  Laterality: N/A;  Anterior repair with TVT bladder sling and cystoscopy  . BLADDER SUSPENSION  05/17/2011   Procedure: TRANSVAGINAL TAPE (TVT) PROCEDURE;  Surgeon: Eli Hose, MD;  Location: [facility];  Service: Gynecology;  Laterality: N/A;  . BREAST SURGERY     breast biopsy  . I&D EXTREMITY Left 01/22/2019   Procedure: IRRIGATION AND DEBRIDEMENT OF LEFT INDEX FINGER,;  Surgeon: Iran Planas, MD;  Location: [facility];  Service: Orthopedics;  Laterality: Left;     A IV Location/Drains/Wounds Patient Lines/Drains/Airways Status   Active Line/Drains/Airways    Name:   Placement date:   Placement time:   Site:   Days:   Peripheral IV 03/25/19 Left Antecubital   03/25/19    1834    Antecubital   1   Incision (Closed) 01/22/19 Hand Left   01/22/19    1849     63  Intake/Output Last 24 hours No intake or output data in the 24 hours ending 03/26/19 0053  Labs/Imaging Results for orders placed or performed during the hospital encounter of 03/25/19 (from the past 48 hour(s))  Comprehensive metabolic panel     Status: Abnormal   Collection Time: 03/25/19  6:02 PM  Result Value Ref Range   Sodium 144 135 - 145 mmol/L   Potassium 3.7 3.5 - 5.1 mmol/L   Chloride 110 98 - 111 mmol/L   CO2 22 22 - 32 mmol/L   Glucose, Bld 82 70 - 99 mg/dL   BUN 45 (H) 8 - 23 mg/dL   Creatinine, Ser 1.57 (H) 0.44 - 1.00 mg/dL   Calcium  9.4 8.9 - 10.3 mg/dL   Total Protein 6.8 6.5 - 8.1 g/dL   Albumin 3.6 3.5 - 5.0 g/dL   AST 23 15 - 41 U/L   ALT 12 0 - 44 U/L   Alkaline Phosphatase 70 38 - 126 U/L   Total Bilirubin 1.0 0.3 - 1.2 mg/dL   GFR calc non Af Amer 30 (L) >60 mL/min   GFR calc Af Amer 34 (L) >60 mL/min   Anion gap 12 5 - 15    Comment: Performed at [facility], [address], [place], [place] [address]  Lactic acid, plasma     Status: Abnormal   Collection Time: 03/25/19  6:02 PM  Result Value Ref Range   Lactic Acid, Venous 2.5 (HH) 0.5 - 1.9 mmol/L    Comment: CRITICAL RESULT CALLED TO, READ BACK BY AND VERIFIED WITHBenard Halsted RN AT [phone] BY Marcos Eke Performed at [facility], [place] [address], [place], [place] [address]   CBC with Differential     Status: Abnormal   Collection Time: 03/25/19  6:02 PM  Result Value Ref Range   WBC 7.9 4.0 - 10.5 K/uL   RBC 3.25 (L) 3.87 - 5.11 MIL/uL   Hemoglobin 9.2 (L) 12.0 - 15.0 g/dL   HCT 29.5 (L) 36.0 - 46.0 %   MCV 90.8 80.0 - 100.0 fL   MCH 28.3 26.0 - 34.0 pg   MCHC 31.2 30.0 - 36.0 g/dL   RDW 14.4 11.5 - 15.5 %   Platelets 174 150 - 400 K/uL   nRBC 0.0 0.0 - 0.2 %   Neutrophils Relative % 95 %   Neutro Abs 7.5 1.7 - 7.7 K/uL   Lymphocytes Relative 3 %   Lymphs Abs 0.2 (L) 0.7 - 4.0 K/uL   Monocytes Relative 1 %   Monocytes Absolute 0.1 0.1 - 1.0 K/uL   Eosinophils Relative 0 %   Eosinophils Absolute 0.0 0.0 - 0.5 K/uL   Basophils Relative 0 %   Basophils Absolute 0.0 0.0 - 0.1 K/uL   Immature Granulocytes 1 %   Abs Immature Granulocytes 0.06 0.00 - 0.07 K/uL    Comment: Performed at [facility], [place] [address] [place], [place] [address]  Protime-INR     Status: Abnormal   Collection Time: 03/25/19  6:02 PM  Result Value Ref Range   Prothrombin Time 17.6 (H) 11.4 - 15.2 seconds   INR 1.5 (H) 0.8 - 1.2    Comment: (NOTE) INR goal varies based on device and disease states. Performed at [facility], [place]  [address] [place], [place] [address]   Culture, blood (Routine x 2)     Status: None (Preliminary result)   Collection Time: 03/25/19  6:03  PM  Result Value Ref Range   Specimen Description BLOOD RIGHT ANTECUBITAL    Special Requests      BOTTLES DRAWN AEROBIC AND ANAEROBIC Blood Culture results may not be optimal due to an inadequate volume of blood received in culture bottles Performed at [facility] [address], [place], [place] [address]    Culture PENDING    Report Status PENDING   Sample to Blood Bank     Status: None   Collection Time: 03/25/19  6:03 PM  Result Value Ref Range   Blood Bank Specimen SAMPLE AVAILABLE FOR TESTING    Sample Expiration      03/26/2019,2359 Performed at [facility], [place] [address] [place], [place] [address]   Type and screen [place]     Status: None   Collection Time: 03/25/19  6:03 PM  Result Value Ref Range   ABO/RH(D) A POS    Antibody Screen NEG    Sample Expiration      03/28/2019,2359 Performed at [facility], [place] [address] [place], [place] [address]   ABO/Rh     Status: None   Collection Time: 03/25/19  6:03 PM  Result Value Ref Range   ABO/RH(D)      A POS Performed at [facility] [address] [place], [place] [address]   Lipase, blood     Status: None   Collection Time: 03/25/19  6:22 PM  Result Value Ref Range   Lipase 24 11 - 51 U/L    Comment: Performed at [facility], [place] [address] [place], [place] [address]  Troponin I - Once     Status: Abnormal   Collection Time: 03/25/19  6:22 PM  Result Value Ref Range   Troponin I 0.06 (HH) <0.03 ng/mL    Comment: CRITICAL RESULT CALLED TO, READ BACK BY AND VERIFIED WITH: L Serrena Linderman RN AT [place] [phone] BY K FORSYTH Performed at [facility], [place] [address] [place], [place] [address]   SARS Coronavirus 2 (CEPHEID- Performed in [facility] lab), Hosp Order     Status: None   Collection Time: 03/25/19   6:35 PM  Result Value Ref Range   SARS Coronavirus 2 NEGATIVE NEGATIVE    Comment: (NOTE) If result is NEGATIVE SARS-CoV-2 target nucleic acids are NOT DETECTED. The SARS-CoV-2 RNA is generally detectable in upper and lower  respiratory specimens during the acute phase of infection. The lowest  concentration of SARS-CoV-2 viral copies this assay can detect is 250  copies / mL. A negative result does not preclude SARS-CoV-2 infection  and should not be used as the sole basis for treatment or other  patient management decisions.  A negative result may occur with  improper specimen collection / handling, submission of specimen other  than nasopharyngeal swab, presence of viral mutation(s) within the  areas targeted by this assay, and inadequate number of viral copies  (<250 copies / mL). A negative result must be combined with clinical  observations, patient history, and epidemiological information. If result is POSITIVE SARS-CoV-2 target nucleic acids are DETECTED. The SARS-CoV-2 RNA is generally detectable in upper and lower  respiratory specimens dur ing the acute phase of infection.  Positive  results are indicative of active infection with SARS-CoV-2.  Clinical  correlation with patient history and other diagnostic information is  necessary to determine patient infection status.  Positive results do  not rule out  bacterial infection or co-infection with other viruses. If result is PRESUMPTIVE POSTIVE SARS-CoV-2 nucleic acids MAY BE PRESENT.   A presumptive positive result was obtained on the submitted specimen  and confirmed on repeat testing.  While 2019 novel coronavirus  (SARS-CoV-2) nucleic acids may be present in the submitted sample  additional confirmatory testing may be necessary for epidemiological  and / or clinical management purposes  to differentiate between  SARS-CoV-2 and other Sarbecovirus currently known to infect humans.  If clinically indicated additional  testing with an alternate test  methodology [phone]) is advised. The SARS-CoV-2 RNA is generally  detectable in upper and lower respiratory sp ecimens during the acute  phase of infection. The expected result is Negative. Fact Sheet for Patients:  StrictlyIdeas.no Fact Sheet for Healthcare Providers: BankingDealers.co.za This test is not yet approved or cleared by the [place] FDA and has been authorized for detection and/or diagnosis of SARS-CoV-2 by FDA under an Emergency Use Authorization (EUA).  This EUA will remain in effect (meaning this test can be used) for the duration of the COVID-19 declaration under Section 564(b)(1) of the Act, 21 U.S.C. section 360bbb-3(b)(1), unless the authorization is terminated or revoked sooner. Performed at [facility], [place] [address], [place], [place] [address]   POC occult blood, ED Provider will collect     Status: None   Collection Time: 03/25/19  7:13 PM  Result Value Ref Range   Fecal Occult Bld NEGATIVE NEGATIVE  Urinalysis, Routine w reflex microscopic     Status: Abnormal   Collection Time: 03/25/19  9:30 PM  Result Value Ref Range   Color, Urine YELLOW YELLOW   APPearance HAZY (A) CLEAR   Specific Gravity, Urine 1.013 1.005 - 1.030   pH 5.0 5.0 - 8.0   Glucose, UA NEGATIVE NEGATIVE mg/dL   Hgb urine dipstick MODERATE (A) NEGATIVE   Bilirubin Urine NEGATIVE NEGATIVE   Ketones, ur NEGATIVE NEGATIVE mg/dL   Protein, ur 30 (A) NEGATIVE mg/dL   Nitrite NEGATIVE NEGATIVE   Leukocytes,Ua MODERATE (A) NEGATIVE   RBC / HPF 0-5 0 - 5 RBC/hpf   WBC, UA 21-50 0 - 5 WBC/hpf   Bacteria, UA MANY (A) NONE SEEN   Squamous Epithelial / LPF 0-5 0 - 5   Mucus PRESENT     Comment: Performed at [facility], [place] [address], [place], [place] [address]   Ct Head Wo Contrast  Result Date: 03/25/2019 CLINICAL DATA:  Decreased level of consciousness EXAM: CT HEAD WITHOUT CONTRAST  TECHNIQUE: Contiguous axial images were obtained from the base of the skull through the vertex without intravenous contrast. COMPARISON:  02/29/2016 FINDINGS: Brain: Chronic atrophic and ischemic changes are noted. No findings to suggest acute hemorrhage, acute infarction or space-occupying mass lesion are noted. Vascular: No hyperdense vessel or unexpected calcification. Skull: Normal. Negative for fracture or focal lesion. Sinuses/Orbits: No acute finding. Other: None. IMPRESSION: Chronic atrophic and ischemic changes without acute abnormality. Electronically Signed   By: Inez Catalina M.D.   On: 03/25/2019 21:30   Ct Abdomen Pelvis W Contrast  Result Date: 03/25/2019 CLINICAL DATA:  85 year old female with history of decreased responsiveness. Febrile patient. Confusion. Black tarry stools. Vomiting. EXAM: CT ABDOMEN AND PELVIS WITH CONTRAST TECHNIQUE: Multidetector CT imaging of the abdomen and pelvis was performed using the standard protocol following bolus administration of intravenous contrast. CONTRAST:  157mL OMNIPAQUE IOHEXOL 300 MG/ML  SOLN COMPARISON:  No priors. FINDINGS: Lower chest: Small right Bochdalek's hernia.  Mild cardiomegaly. Hepatobiliary: No suspicious cystic or solid hepatic lesions. No intra or extrahepatic biliary ductal dilatation. Small amount of high attenuation material lying dependently in the gallbladder which may reflect biliary sludge and/or tiny gallstones. No findings to suggest an acute cholecystitis at this time. Pancreas: No pancreatic mass. No pancreatic ductal dilatation. No pancreatic or peripancreatic fluid or inflammatory changes. Spleen: Unremarkable. Adrenals/Urinary Tract: In the upper pole of the right kidney there is a 1.8 cm low-attenuation lesion, compatible with a simple cyst. Left kidney and bilateral adrenal glands are normal in appearance. Mild left hydroureteronephrosis which terminates in the proximal third of the left ureter. No right  hydroureteronephrosis. Small amount of gas non dependently in the lumen of the urinary bladder. Urinary bladder is otherwise unremarkable in appearance. Stomach/Bowel: Normal appearance of the stomach. No pathologic dilatation of small bowel or colon. Numerous colonic diverticulae are noted, without surrounding inflammatory changes to suggest an acute diverticulitis at this time. Normal appendix. Vascular/Lymphatic: Aortic atherosclerosis, without evidence of aneurysm or dissection in the abdominal or pelvic vasculature. No lymphadenopathy noted in the abdomen or pelvis. Reproductive: Status post hysterectomy. Ovaries are not confidently identified may be surgically absent or atrophic Other: No significant volume of ascites.  No pneumoperitoneum. Musculoskeletal: There are no aggressive appearing lytic or blastic lesions noted in the visualized portions of the skeleton. IMPRESSION: 1. Extensive colonic diverticulosis without evidence to suggest an acute diverticulitis at this time. 2. Mild left hydroureteronephrosis terminating in the proximal third of the left ureter. No obstructing stone identified. The possibility of a ureteral stricture could be considered. 3. Small amount of gas non dependently in the urinary bladder. This may be iatrogenic if there has been recent catheterization. In the absence of catheterization, correlation with urinalysis is suggested to exclude the possibility of urinary tract infection with gas-forming organisms. 4. Aortic atherosclerosis. 5. Mild cardiomegaly. 6. Trace amount of biliary sludge and/or tiny gallstones lying dependently in the gallbladder. No findings to suggest an acute cholecystitis at this time. Electronically Signed   By: Vinnie Langton M.D.   On: 03/25/2019 21:45   Dg Chest Port 1 View  Result Date: 03/25/2019 CLINICAL DATA:  Fever. EXAM: PORTABLE CHEST 1 VIEW COMPARISON:  Chest x-ray dated July 21, 2014. FINDINGS: Unchanged mild cardiomegaly. Normal  mediastinal contours. Normal pulmonary vascularity. No focal consolidation, pleural effusion, or pneumothorax. No acute osseous abnormality. IMPRESSION: No active disease. Electronically Signed   By: Titus Dubin M.D.   On: 03/25/2019 19:13    Pending Labs Unresulted Labs (From admission, onward)    Start     Ordered   03/25/19 1829  Brain natriuretic peptide  Add-on,   STAT     03/25/19 1828   03/25/19 1757  Lactic acid, plasma  Now then every 2 hours,   STAT     03/25/19 1756   03/25/19 1757  Culture, blood (Routine x 2)  BLOOD CULTURE X 2,   STAT     03/25/19 1756   03/25/19 1757  Urine culture  ONCE - STAT,   STAT     03/25/19 1756   Signed and Held  Magnesium  Once,   R     Signed and Held   Signed and Held  Phosphorus  Once,   R     Signed and Held   Signed and Held  Urine culture  Once,   R     Signed and Held   Signed and Occupational hygienist  morning,   R     Signed and Held   Signed and Held  CBC  Tomorrow morning,   R     Signed and Held   Signed and Held  Troponin I - Once  Once,   R     Signed and Held          Vitals/Pain Today's Vitals   03/25/19 2230 03/25/19 2300 03/25/19 2330 03/26/19 0030  BP: (!) 160/129 (!) 164/69 (!) 164/50 (!) 163/56  Pulse: 64 64 69 63  Resp: 16 17 13 15   Temp:      TempSrc:      SpO2: 99% 98% 100% 98%  Weight:      Height:      PainSc:        Isolation Precautions Droplet and Contact precautions  Medications Medications  acetaminophen (TYLENOL) tablet 650 mg (650 mg Oral Given 03/25/19 1831)  sodium chloride 0.9 % bolus 500 mL (500 mLs Intravenous New Bag/Given 03/25/19 1835)  ondansetron (ZOFRAN) injection 4 mg (4 mg Intravenous Given 03/25/19 2125)  sodium chloride 0.9 % bolus 500 mL (500 mLs Intravenous New Bag/Given 03/25/19 2125)  iohexol (OMNIPAQUE) 300 MG/ML solution 100 mL (100 mLs Intravenous Contrast Given 03/25/19 2120)  cefTRIAXone (ROCEPHIN) 1 g in sodium chloride 0.9 % 100 mL IVPB (1 g  Intravenous New Bag/Given 03/25/19 2203)    Mobility walks with person assist Low fall risk   Focused Assessments Neuro Assessment Handoff:  Swallow screen pass? Yes          Neuro Assessment:   Neuro Checks:      Last Documented NIHSS Modified Score:   Has TPA been given? No   If patient is a Neuro Trauma and patient is going to OR before floor call report to [place] nurse: [phone] or [phone]     R Recommendations: See Admitting Provider Note  Report given to:   Additional Notes: See note section for family's contact info.

## 2019-03-26 NOTE — Progress Notes (Addendum)
Progress Note    Courtney Bradford  YBO:[id number] DOB: February 22, 1931  DOA: 03/25/2019 PCP: Iona Beard, MD    Brief Narrative:   Chief complaint: Follow-up fever and AMS.  Medical records reviewed and are as summarized below:  Courtney Bradford is an 85 y.o. female with a PMH of hypertension, gout, diabetes, stage III CKD, diastolic CHF, and atrial fibrillation on chronic anticoagulation with Eliquis who was admitted 03/24/2018 for evaluation of fever associated with vomiting, diarrhea, and acute encephalopathy.  On admission, temperature was 101.6 Fahrenheit and urinalysis was suggestive of UTI.  Cultures were sent and empiric antibiotics were initiated.  Assessment/Plan:   Principal Problem:   Acute encephalopathy secondary to lower urinary tract infectious disease/UTI associated with mild hydroureteronephrosis and gas in the bladder per CT scan findings CT of the head personally reviewed, negative for acute findings, showed atrophy.  CT of the abdomen/pelvis personally reviewed and showed right-sided hydroureteronephrosis.  Urinalysis consistent with acute infection.  Fluid boluses given and placed on empiric antibiotics on admission given mild elevation of lactic acid.  Continue Rocephin.  Follow-up cultures.  WBC 7.9 ---> 16.7.  This morning, she is disoriented but otherwise alert.  Obtain PT/OT evaluations.  Active Problems:   Essential hypertension Continue Norvasc and Lasix.    Atrial fibrillation, chronic (HCC) Continue Eliquis.    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  CBG's currently running 79-88.    Normocytic anemia Continue iron supplementation.  Hemoglobin 8.5-9.2 with baseline hemoglobin of 8.7.  Occult blood testing negative.    Chronic diastolic CHF/elevated troponin/sinus bradycardia/first-degree AV block/mild cardiomegaly/aortic atherosclerosis On telemetry which we will  continue.  Continue Lasix.  Troponin 0 0.06, 0.11.  BNP 736.3.  Likely secondary to demand ischemia in the setting of acute infection.  2D echo done 01/23/2019.  EF 50-55%.  No evidence of pulmonary edema on chest x-ray, personally reviewed.  EKG personally reviewed and shows marked sinus bradycardia at 45 bpm with first-degree AV block.  Atropine ordered as needed.  Heart rate improved to 60 bpm this morning.     Stage III CKD Baseline creatinine appears to be 1.5- 2.0.  Current creatinine consistent with usual baseline values, and the GFR in the 20-35 range.    Hypomagnesemia We will replete orally.  Body mass index is 24.18 kg/m.   Family Communication/Anticipated D/C date and plan/Code Status   DVT prophylaxis: Eliquis ordered. Code Status: Full Code.  Family Communication: Son updated by telephone. Disposition Plan: Home versus SNF depending on PT/OT evaluations.  Anticipate possible discharge 03/27/2019 if able to ambulate.  Otherwise may need SNF.  Inpatient remains appropriate given the patient continues to be disoriented.  Await culture data.  Await PT/OT evaluations.   Medical Consultants:    None.   Anti-Infectives:    Rocephin 03/26/2019--->  Subjective:   Sitting up, eating breakfast.  Pleasantly confused and alert.  Denies pain, shortness of breath, or other symptoms.  Could not tell me the date and thought that it was February.  Appetite appears to be good.  Objective:    Vitals:   03/26/19 0154 03/26/19 0222 03/26/19 0226 03/26/19 0427  BP: 113/61  (!) 157/57 (!) 146/44  Pulse: (!) 55  63 60  Resp: 14  18 17   Temp:   98.4 F (36.9 C) 98 F (36.7 C)  TempSrc:   Oral Oral  SpO2: 98%  100% 100%  Weight:  67.9 kg    Height:  5\' 6"  (1.676 m)      Intake/Output Summary (Last 24 hours) at 03/26/2019 0754 Last data filed at 03/26/2019 0118 Gross per 24 hour  Intake 99.47 ml  Output --  Net 99.47 ml   Filed Weights   03/25/19 1755 03/25/19 1811 03/26/19  0222  Weight: 70.8 kg 70 kg 67.9 kg    Exam: General: No acute distress.  Sitting up, alert. Cardiovascular: Heart sounds show a regular rate, and rhythm. No gallops or rubs.  Grade 1/6 systolic murmur. No JVD. Lungs: Clear to auscultation bilaterally with good air movement. No rales, rhonchi or wheezes. Abdomen: Soft, nontender, nondistended with normal active bowel sounds. No masses. No hepatosplenomegaly. Neurological: Alert and oriented 1. Moves all extremities 4 weakly but with equal strength. Cranial nerves II through XII grossly intact. Skin: Warm and dry. No rashes or lesions. Extremities: No clubbing or cyanosis.  2+ edema. Pedal pulses 2+. Psychiatric: Mood and affect are normal. Insight and judgment are impaired.   Data Reviewed:   I have personally reviewed following labs and imaging studies:  Labs: Labs show the following:   Basic Metabolic Panel: Recent Labs  Lab 03/25/19 1802 03/26/19 0340  NA 144 144  K 3.7 4.0  CL 110 109  CO2 22 23  GLUCOSE 82 90  BUN 45* 39*  CREATININE 1.57* 1.44*  CALCIUM 9.4 9.1  MG  --  1.6*  PHOS  --  3.0   GFR Estimated Creatinine Clearance: 26.7 mL/min (A) (by C-G formula based on SCr of 1.44 mg/dL (H)). Liver Function Tests: Recent Labs  Lab 03/25/19 1802  AST 23  ALT 12  ALKPHOS 70  BILITOT 1.0  PROT 6.8  ALBUMIN 3.6   Recent Labs  Lab 03/25/19 1822  LIPASE 24   Coagulation profile Recent Labs  Lab 03/25/19 1802  INR 1.5*    CBC: Recent Labs  Lab 03/25/19 1802 03/26/19 0340  WBC 7.9 16.7*  NEUTROABS 7.5  --   HGB 9.2* 8.5*  HCT 29.5* 27.0*  MCV 90.8 90.9  PLT 174 163   Cardiac Enzymes: Recent Labs  Lab 03/25/19 1822 03/26/19 0340  TROPONINI 0.06* 0.11*   CBG: Recent Labs  Lab 03/26/19 0234 03/26/19 0605  GLUCAP 79 88    Sepsis Labs: Recent Labs  Lab 03/25/19 1802 03/26/19 0340  WBC 7.9 16.7*  LATICACIDVEN 2.5* 1.9    Microbiology Recent Results (from the past 240 hour(s))   Culture, blood (Routine x 2)     Status: None (Preliminary result)   Collection Time: 03/25/19  6:03 PM  Result Value Ref Range Status   Specimen Description BLOOD RIGHT ANTECUBITAL  Final   Special Requests   Final    BOTTLES DRAWN AEROBIC AND ANAEROBIC Blood Culture results may not be optimal due to an inadequate volume of blood received in culture bottles Performed at [facility] [address] [place], [place] [address]    Culture PENDING  Incomplete   Report Status PENDING  Incomplete  SARS Coronavirus 2 (CEPHEID- Performed in [place] hospital lab), Hosp Order     Status: None   Collection Time: 03/25/19  6:35 PM  Result Value Ref Range Status   SARS Coronavirus 2 NEGATIVE NEGATIVE Final    Comment: (NOTE) If result is NEGATIVE SARS-CoV-2 target nucleic acids are NOT DETECTED. The SARS-CoV-2 RNA is generally detectable in upper and lower  respiratory specimens during the acute phase of infection. The  lowest  concentration of SARS-CoV-2 viral copies this assay can detect is 250  copies / mL. A negative result does not preclude SARS-CoV-2 infection  and should not be used as the sole basis for treatment or other  patient management decisions.  A negative result may occur with  improper specimen collection / handling, submission of specimen other  than nasopharyngeal swab, presence of viral mutation(s) within the  areas targeted by this assay, and inadequate number of viral copies  (<250 copies / mL). A negative result must be combined with clinical  observations, patient history, and epidemiological information. If result is POSITIVE SARS-CoV-2 target nucleic acids are DETECTED. The SARS-CoV-2 RNA is generally detectable in upper and lower  respiratory specimens dur ing the acute phase of infection.  Positive  results are indicative of active infection with SARS-CoV-2.  Clinical  correlation with patient history and other diagnostic information is  necessary to  determine patient infection status.  Positive results do  not rule out bacterial infection or co-infection with other viruses. If result is PRESUMPTIVE POSTIVE SARS-CoV-2 nucleic acids MAY BE PRESENT.   A presumptive positive result was obtained on the submitted specimen  and confirmed on repeat testing.  While 2019 novel coronavirus  (SARS-CoV-2) nucleic acids may be present in the submitted sample  additional confirmatory testing may be necessary for epidemiological  and / or clinical management purposes  to differentiate between  SARS-CoV-2 and other Sarbecovirus currently known to infect humans.  If clinically indicated additional testing with an alternate test  methodology [id number]) is advised. The SARS-CoV-2 RNA is generally  detectable in upper and lower respiratory sp ecimens during the acute  phase of infection. The expected result is Negative. Fact Sheet for Patients:  StrictlyIdeas.no Fact Sheet for Healthcare Providers: BankingDealers.co.za This test is not yet approved or cleared by the [place] FDA and has been authorized for detection and/or diagnosis of SARS-CoV-2 by FDA under an Emergency Use Authorization (EUA).  This EUA will remain in effect (meaning this test can be used) for the duration of the COVID-19 declaration under Section 564(b)(1) of the Act, 21 U.S.C. section 360bbb-3(b)(1), unless the authorization is terminated or revoked sooner. Performed at [facility], [place] [address] [place], [place] [address]     Procedures and diagnostic studies:  Ct Head Wo Contrast  Result Date: 03/25/2019 CLINICAL DATA:  Decreased level of consciousness EXAM: CT HEAD WITHOUT CONTRAST TECHNIQUE: Contiguous axial images were obtained from the base of the skull through the vertex without intravenous contrast. COMPARISON:  02/29/2016 FINDINGS: Brain: Chronic atrophic and ischemic changes are noted. No findings to suggest  acute hemorrhage, acute infarction or space-occupying mass lesion are noted. Vascular: No hyperdense vessel or unexpected calcification. Skull: Normal. Negative for fracture or focal lesion. Sinuses/Orbits: No acute finding. Other: None. IMPRESSION: Chronic atrophic and ischemic changes without acute abnormality. Electronically Signed   By: Inez Catalina M.D.   On: 03/25/2019 21:30   Ct Abdomen Pelvis W Contrast  Result Date: 03/25/2019 CLINICAL DATA:  85 year old female with history of decreased responsiveness. Febrile patient. Confusion. Black tarry stools. Vomiting. EXAM: CT ABDOMEN AND PELVIS WITH CONTRAST TECHNIQUE: Multidetector CT imaging of the abdomen and pelvis was performed using the standard protocol following bolus administration of intravenous contrast. CONTRAST:  188mL OMNIPAQUE IOHEXOL 300 MG/ML  SOLN COMPARISON:  No priors. FINDINGS: Lower chest: Small right Bochdalek's hernia.  Mild cardiomegaly. Hepatobiliary: No suspicious cystic or solid hepatic lesions. No intra or extrahepatic biliary ductal dilatation.  Small amount of high attenuation material lying dependently in the gallbladder which may reflect biliary sludge and/or tiny gallstones. No findings to suggest an acute cholecystitis at this time. Pancreas: No pancreatic mass. No pancreatic ductal dilatation. No pancreatic or peripancreatic fluid or inflammatory changes. Spleen: Unremarkable. Adrenals/Urinary Tract: In the upper pole of the right kidney there is a 1.8 cm low-attenuation lesion, compatible with a simple cyst. Left kidney and bilateral adrenal glands are normal in appearance. Mild left hydroureteronephrosis which terminates in the proximal third of the left ureter. No right hydroureteronephrosis. Small amount of gas non dependently in the lumen of the urinary bladder. Urinary bladder is otherwise unremarkable in appearance. Stomach/Bowel: Normal appearance of the stomach. No pathologic dilatation of small bowel or colon.  Numerous colonic diverticulae are noted, without surrounding inflammatory changes to suggest an acute diverticulitis at this time. Normal appendix. Vascular/Lymphatic: Aortic atherosclerosis, without evidence of aneurysm or dissection in the abdominal or pelvic vasculature. No lymphadenopathy noted in the abdomen or pelvis. Reproductive: Status post hysterectomy. Ovaries are not confidently identified may be surgically absent or atrophic Other: No significant volume of ascites.  No pneumoperitoneum. Musculoskeletal: There are no aggressive appearing lytic or blastic lesions noted in the visualized portions of the skeleton. IMPRESSION: 1. Extensive colonic diverticulosis without evidence to suggest an acute diverticulitis at this time. 2. Mild left hydroureteronephrosis terminating in the proximal third of the left ureter. No obstructing stone identified. The possibility of a ureteral stricture could be considered. 3. Small amount of gas non dependently in the urinary bladder. This may be iatrogenic if there has been recent catheterization. In the absence of catheterization, correlation with urinalysis is suggested to exclude the possibility of urinary tract infection with gas-forming organisms. 4. Aortic atherosclerosis. 5. Mild cardiomegaly. 6. Trace amount of biliary sludge and/or tiny gallstones lying dependently in the gallbladder. No findings to suggest an acute cholecystitis at this time. Electronically Signed   By: Vinnie Langton M.D.   On: 03/25/2019 21:45   Dg Chest Port 1 View  Result Date: 03/25/2019 CLINICAL DATA:  Fever. EXAM: PORTABLE CHEST 1 VIEW COMPARISON:  Chest x-ray dated July 21, 2014. FINDINGS: Unchanged mild cardiomegaly. Normal mediastinal contours. Normal pulmonary vascularity. No focal consolidation, pleural effusion, or pneumothorax. No acute osseous abnormality. IMPRESSION: No active disease. Electronically Signed   By: Titus Dubin M.D.   On: 03/25/2019 19:13     Medications:    amLODipine  2.5 mg Oral Daily   apixaban  2.5 mg Oral BID   Ferrous Fumarate  106 mg of iron Oral Q breakfast   furosemide  20 mg Oral Q M,W,F   insulin aspart  0-5 Units Subcutaneous QHS   insulin aspart  0-9 Units Subcutaneous TID WC   olopatadine  1 drop Both Eyes BID   Continuous Infusions:  cefTRIAXone (ROCEPHIN)  IV       LOS: 0 days  Extended visit given that the patient was admitted shortly after midnight and she did multiple tests followed up on, 30 minutes spent from 915-9:45 AM.  [facility] Pager [phone]. If unable to reach me by pager, please call my cell phone at [phone].  *Please refer to [email or web address], password [email or web address] to get updated schedule on who will round on this patient, as hospitalists switch teams weekly. If 7PM-7AM, please contact night-coverage at [email or web address], password [email or web address] for any overnight needs.  03/26/2019, 7:54 AM

## 2019-03-26 NOTE — ED Notes (Signed)
Attempted report to 3E. Per Kennieth Francois RN they are unable to take report at this time. Bed Assigned for 41 minutes. ED Charge RN made aware.

## 2019-03-26 NOTE — Progress Notes (Signed)
Currently no new orders for patient.

## 2019-03-26 NOTE — Progress Notes (Signed)
CCMD notified of pt brady to 35.  Sustaining in low to mid 40s.  RN notified triad.  Pt asymptomatic.

## 2019-03-26 NOTE — Progress Notes (Signed)
Received orders for 0.5mg  atropine if HR sustains below 30.  Will continue to monitor.

## 2019-03-26 NOTE — Progress Notes (Signed)
EKG completed on patient.  Appears to have changes compared to ED EKG.  Troponin levels increased.  RN notified triad.

## 2019-03-26 NOTE — H&P (Signed)
History and Physical  Courtney Bradford PQD:[id number] DOB: Jul 09, 1931 DOA: 03/25/2019  Referring physician: ER provider PCP: Iona Beard, MD  Outpatient Specialists:    Patient coming from: Home  Chief Complaint: Fever and altered mental status.  HPI: Patient is an 85 year old African-American female medical history significant for hypertension, gout, diabetes mellitus, chronic kidney disease stage III diastolic heart failure and atrial fibrillation on Eliquis.  As per collateral information, patient has had acute toxic encephalopathy secondary to UTI in the past.  Patient was noted to have developed altered mentation, being slow to respond.  Fever was reported with associated vomiting and diarrhea as per ER provider.  Patient is a poor historian and cannot give any significant history.  On presentation to the hospital, patient was found to be febrile with temperature of 101.6 F and UA days suggestive of likely UTI.  No headache, no neck pain, no shortness of breath, no URI symptoms, no chest pain, no urinary symptoms.  Patient be admitted for further assessment and management.  ED Course: Patient was pancultured and started on IV antibiotics.  Hospitalist team was consulted to admit patient.  Pertinent labs: Chemistry reveals sodium of 144, potassium of 3.7, chloride 110, CO2 22, BUN of 45, creatinine of 1.57 with baseline around 2, blood sugar of 82.  Lipase is 24.  First troponin is 0.06, lactic acid is 2.5.  CBC reveals WBC of 7.9, hemoglobin of 9.2, hematocrit of 29.5, MCV of 90.8 with platelet count of 174.  UA reveals moderate leukocyte esterase, many bacteria and WBC of 21-50.  EKG: Independently reviewed.   Imaging: independently reviewed.  CT head has not shown any acute findings.  CT abdomen and pelvis finding is noted (mild left hydro-ureteronephrosis and gas in bladder (  Review of Systems: Patient is a poor historian, but endorsed fever.  No visual changes, sore throat, rash, new  muscle aches, chest pain, SOB, dysuria, bleeding, n/v/abdominal pain.  Past Medical History:  Diagnosis Date   A-fib ([place])    Anemia    Arthritis    right knee   Cataract    CHF (congestive heart failure) (HCC)    CKD (chronic kidney disease), stage III (HCC)    Dehydration    Diabetes mellitus    Gout    Headache(784.0)    Hypertension    UTI (lower urinary tract infection)     Past Surgical History:  Procedure Laterality Date   ABDOMINAL HYSTERECTOMY     AMPUTATION FINGER Left 01/22/2019   Procedure: Amputation Left Index Finger First Joint;  Surgeon: Iran Planas, MD;  Location: [facility];  Service: Orthopedics;  Laterality: Left;   ANTERIOR AND POSTERIOR REPAIR  05/17/2011   Procedure: ANTERIOR (CYSTOCELE) AND POSTERIOR REPAIR (RECTOCELE);  Surgeon: Eli Hose, MD;  Location: [facility];  Service: Gynecology;  Laterality: N/A;  Anterior repair with TVT bladder sling and cystoscopy   BLADDER SUSPENSION  05/17/2011   Procedure: TRANSVAGINAL TAPE (TVT) PROCEDURE;  Surgeon: Eli Hose, MD;  Location: [facility];  Service: Gynecology;  Laterality: N/A;   BREAST SURGERY     breast biopsy   I&D EXTREMITY Left 01/22/2019   Procedure: IRRIGATION AND DEBRIDEMENT OF LEFT INDEX FINGER,;  Surgeon: Iran Planas, MD;  Location: [facility];  Service: Orthopedics;  Laterality: Left;     reports that she has never smoked. She has never used smokeless tobacco. She reports that she does not drink alcohol or use drugs.  Allergies  Allergen Reactions   Lactose  Intolerance (Gi) Other (See Comments)    Upset stomach    History reviewed. No pertinent family history.   Prior to Admission medications   Medication Sig Start Date End Date Taking? Authorizing Provider  acetaminophen (TYLENOL) 500 MG tablet Take 500 mg by mouth every 6 (six) hours as needed for fever.    [provider]  amLODipine (NORVASC) 2.5 MG tablet Take 1 tablet (2.5 mg total) by mouth daily.  01/26/19   Raiford Noble Latif, DO  apixaban (ELIQUIS) 2.5 MG TABS tablet Take 1 tablet (2.5 mg total) by mouth 2 (two) times daily. 01/25/19   Raiford Noble Latif, DO  cephALEXin (KEFLEX) 250 MG capsule Take 1 capsule (250 mg total) by mouth 3 (three) times daily. 01/18/19   Virgel Manifold, MD  colchicine 0.6 MG tablet Take 0.5 tablets (0.3 mg total) by mouth daily. 01/26/19   Raiford Noble Latif, DO  ferrous fumarate (HEMOCYTE - 106 MG FE) 325 (106 FE) MG TABS tablet Take 1 tablet by mouth every morning.     [provider]  furosemide (LASIX) 20 MG tablet Take 1 tablet (20 mg total) by mouth every Monday, Wednesday, and Friday. DISCUSS WITH CARDIOLOGY PRIOR to RESUMING 01/27/19   Raiford Noble Latif, DO  HYDROcodone-acetaminophen (NORCO/VICODIN) 5-325 MG tablet Take 1-2 tablets by mouth every 6 (six) hours as needed. 01/18/19   Virgel Manifold, MD  olopatadine (PATANOL) 0.1 % ophthalmic solution Place 1 drop into both eyes 2 (two) times daily. 01/25/19   Raiford Noble [place], DO    Physical Exam: Vitals:   03/25/19 2200 03/25/19 2230 03/25/19 2300 03/25/19 2330  BP: 140/64 (!) 160/129 (!) 164/69 (!) 164/50  Pulse: 65 64 64 69  Resp: (!) 21 16 17 13   Temp:      TempSrc:      SpO2: 98% 99% 98% 100%  Weight:      Height:        Constitutional:   Appears calm and comfortable Eyes:   Mild pallor. No jaundice ENMT:   external ears, nose appear normal Neck:   Neck is supple. No JVD Respiratory:   CTA bilaterally, no w/r/r.   Respiratory effort normal. No retractions or accessory muscle use Cardiovascular:   S1S2  Bilateral mild LE extremity edema   Abdomen:   Abdomen is soft and non tender. Organs are difficult to assess. Neurologic:   Awake and alert.  Moves all limbs.  Wt Readings from Last 3 Encounters:  03/25/19 70 kg  01/25/19 69.3 kg  01/18/19 73.5 kg    I have personally reviewed following labs and imaging studies  Labs on Admission:  CBC: Recent  Labs  Lab 03/25/19 1802  WBC 7.9  NEUTROABS 7.5  HGB 9.2*  HCT 29.5*  MCV 90.8  PLT 846   Basic Metabolic Panel: Recent Labs  Lab 03/25/19 1802  NA 144  K 3.7  CL 110  CO2 22  GLUCOSE 82  BUN 45*  CREATININE 1.57*  CALCIUM 9.4   Liver Function Tests: Recent Labs  Lab 03/25/19 1802  AST 23  ALT 12  ALKPHOS 70  BILITOT 1.0  PROT 6.8  ALBUMIN 3.6   Recent Labs  Lab 03/25/19 1822  LIPASE 24   No results for input(s): AMMONIA in the last 168 hours. Coagulation Profile: Recent Labs  Lab 03/25/19 1802  INR 1.5*   Cardiac Enzymes: Recent Labs  Lab 03/25/19 1822  TROPONINI 0.06*   BNP (last 3 results) No results for input(s):  PROBNP in the last 8760 hours. HbA1C: No results for input(s): HGBA1C in the last 72 hours. CBG: No results for input(s): GLUCAP in the last 168 hours. Lipid Profile: No results for input(s): CHOL, HDL, LDLCALC, TRIG, CHOLHDL, LDLDIRECT in the last 72 hours. Thyroid Function Tests: No results for input(s): TSH, T4TOTAL, FREET4, T3FREE, THYROIDAB in the last 72 hours. Anemia Panel: No results for input(s): VITAMINB12, FOLATE, FERRITIN, TIBC, IRON, RETICCTPCT in the last 72 hours. Urine analysis:    Component Value Date/Time   COLORURINE YELLOW 03/25/2019 2130   APPEARANCEUR HAZY (A) 03/25/2019 2130   LABSPEC 1.013 03/25/2019 2130   PHURINE 5.0 03/25/2019 2130   GLUCOSEU NEGATIVE 03/25/2019 2130   HGBUR MODERATE (A) 03/25/2019 2130   BILIRUBINUR NEGATIVE 03/25/2019 2130   [place] NEGATIVE 03/25/2019 2130   PROTEINUR 30 (A) 03/25/2019 2130   UROBILINOGEN 1.0 07/22/2014 0300   NITRITE NEGATIVE 03/25/2019 2130   LEUKOCYTESUR MODERATE (A) 03/25/2019 2130   Sepsis Labs: @LABRCNTIP (procalcitonin:4,lacticidven:4) ) Recent Results (from the past 240 hour(s))  Culture, blood (Routine x 2)     Status: None (Preliminary result)   Collection Time: 03/25/19  6:03 PM  Result Value Ref Range Status   Specimen Description BLOOD RIGHT  ANTECUBITAL  Final   Special Requests   Final    BOTTLES DRAWN AEROBIC AND ANAEROBIC Blood Culture results may not be optimal due to an inadequate volume of blood received in culture bottles Performed at [facility], [place] [address], [place],  [address]    Culture PENDING  Incomplete   Report Status PENDING  Incomplete  SARS Coronavirus 2 (CEPHEID- Performed in [facility] hospital lab), Hosp Order     Status: None   Collection Time: 03/25/19  6:35 PM  Result Value Ref Range Status   SARS Coronavirus 2 NEGATIVE NEGATIVE Final    Comment: (NOTE) If result is NEGATIVE SARS-CoV-2 target nucleic acids are NOT DETECTED. The SARS-CoV-2 RNA is generally detectable in upper and lower  respiratory specimens during the acute phase of infection. The lowest  concentration of SARS-CoV-2 viral copies this assay can detect is 250  copies / mL. A negative result does not preclude SARS-CoV-2 infection  and should not be used as the sole basis for treatment or other  patient management decisions.  A negative result may occur with  improper specimen collection / handling, submission of specimen other  than nasopharyngeal swab, presence of viral mutation(s) within the  areas targeted by this assay, and inadequate number of viral copies  (<250 copies / mL). A negative result must be combined with clinical  observations, patient history, and epidemiological information. If result is POSITIVE SARS-CoV-2 target nucleic acids are DETECTED. The SARS-CoV-2 RNA is generally detectable in upper and lower  respiratory specimens dur ing the acute phase of infection.  Positive  results are indicative of active infection with SARS-CoV-2.  Clinical  correlation with patient history and other diagnostic information is  necessary to determine patient infection status.  Positive results do  not rule out bacterial infection or co-infection with other viruses. If result is PRESUMPTIVE POSTIVE SARS-CoV-2  nucleic acids MAY BE PRESENT.   A presumptive positive result was obtained on the submitted specimen  and confirmed on repeat testing.  While 2019 novel coronavirus  (SARS-CoV-2) nucleic acids may be present in the submitted sample  additional confirmatory testing may be necessary for epidemiological  and / or clinical management purposes  to differentiate between  SARS-CoV-2 and other Sarbecovirus currently  known to infect humans.  If clinically indicated additional testing with an alternate test  methodology [phone]) is advised. The SARS-CoV-2 RNA is generally  detectable in upper and lower respiratory sp ecimens during the acute  phase of infection. The expected result is Negative. Fact Sheet for Patients:  StrictlyIdeas.no Fact Sheet for Healthcare Providers: BankingDealers.co.za This test is not yet approved or cleared by the [place] FDA and has been authorized for detection and/or diagnosis of SARS-CoV-2 by FDA under an Emergency Use Authorization (EUA).  This EUA will remain in effect (meaning this test can be used) for the duration of the COVID-19 declaration under Section 564(b)(1) of the Act, 21 U.S.C. section 360bbb-3(b)(1), unless the authorization is terminated or revoked sooner. Performed at [facility], [place] [address], [place], [place] [address]       Radiological Exams on Admission: Ct Head Wo Contrast  Result Date: 03/25/2019 CLINICAL DATA:  Decreased level of consciousness EXAM: CT HEAD WITHOUT CONTRAST TECHNIQUE: Contiguous axial images were obtained from the base of the skull through the vertex without intravenous contrast. COMPARISON:  02/29/2016 FINDINGS: Brain: Chronic atrophic and ischemic changes are noted. No findings to suggest acute hemorrhage, acute infarction or space-occupying mass lesion are noted. Vascular: No hyperdense vessel or unexpected calcification. Skull: Normal. Negative for fracture  or focal lesion. Sinuses/Orbits: No acute finding. Other: None. IMPRESSION: Chronic atrophic and ischemic changes without acute abnormality. Electronically Signed   By: Inez Catalina M.D.   On: 03/25/2019 21:30   Ct Abdomen Pelvis W Contrast  Result Date: 03/25/2019 CLINICAL DATA:  85 year old female with history of decreased responsiveness. Febrile patient. Confusion. Black tarry stools. Vomiting. EXAM: CT ABDOMEN AND PELVIS WITH CONTRAST TECHNIQUE: Multidetector CT imaging of the abdomen and pelvis was performed using the standard protocol following bolus administration of intravenous contrast. CONTRAST:  160mL OMNIPAQUE IOHEXOL 300 MG/ML  SOLN COMPARISON:  No priors. FINDINGS: Lower chest: Small right Bochdalek's hernia.  Mild cardiomegaly. Hepatobiliary: No suspicious cystic or solid hepatic lesions. No intra or extrahepatic biliary ductal dilatation. Small amount of high attenuation material lying dependently in the gallbladder which may reflect biliary sludge and/or tiny gallstones. No findings to suggest an acute cholecystitis at this time. Pancreas: No pancreatic mass. No pancreatic ductal dilatation. No pancreatic or peripancreatic fluid or inflammatory changes. Spleen: Unremarkable. Adrenals/Urinary Tract: In the upper pole of the right kidney there is a 1.8 cm low-attenuation lesion, compatible with a simple cyst. Left kidney and bilateral adrenal glands are normal in appearance. Mild left hydroureteronephrosis which terminates in the proximal third of the left ureter. No right hydroureteronephrosis. Small amount of gas non dependently in the lumen of the urinary bladder. Urinary bladder is otherwise unremarkable in appearance. Stomach/Bowel: Normal appearance of the stomach. No pathologic dilatation of small bowel or colon. Numerous colonic diverticulae are noted, without surrounding inflammatory changes to suggest an acute diverticulitis at this time. Normal appendix. Vascular/Lymphatic: Aortic  atherosclerosis, without evidence of aneurysm or dissection in the abdominal or pelvic vasculature. No lymphadenopathy noted in the abdomen or pelvis. Reproductive: Status post hysterectomy. Ovaries are not confidently identified may be surgically absent or atrophic Other: No significant volume of ascites.  No pneumoperitoneum. Musculoskeletal: There are no aggressive appearing lytic or blastic lesions noted in the visualized portions of the skeleton. IMPRESSION: 1. Extensive colonic diverticulosis without evidence to suggest an acute diverticulitis at this time. 2. Mild left hydroureteronephrosis terminating in the proximal third of the left ureter. No obstructing stone identified. The possibility  of a ureteral stricture could be considered. 3. Small amount of gas non dependently in the urinary bladder. This may be iatrogenic if there has been recent catheterization. In the absence of catheterization, correlation with urinalysis is suggested to exclude the possibility of urinary tract infection with gas-forming organisms. 4. Aortic atherosclerosis. 5. Mild cardiomegaly. 6. Trace amount of biliary sludge and/or tiny gallstones lying dependently in the gallbladder. No findings to suggest an acute cholecystitis at this time. Electronically Signed   By: Vinnie Langton M.D.   On: 03/25/2019 21:45   Dg Chest Port 1 View  Result Date: 03/25/2019 CLINICAL DATA:  Fever. EXAM: PORTABLE CHEST 1 VIEW COMPARISON:  Chest x-ray dated July 21, 2014. FINDINGS: Unchanged mild cardiomegaly. Normal mediastinal contours. Normal pulmonary vascularity. No focal consolidation, pleural effusion, or pneumothorax. No acute osseous abnormality. IMPRESSION: No active disease. Electronically Signed   By: Titus Dubin M.D.   On: 03/25/2019 19:13    EKG: Independently reviewed.   Active Problems:   Acute encephalopathy   Assessment/Plan Acute toxic encephalopathy: Likely secondary to UTI Admit patient for further  assessment and management Panculture patient IV antibiotics Further management depend on hospital course.  UTI, possible complicated: Follow cultures. CT abdomen and pelvis findings noted (mild hydro-ureteronephrosis and gas in bladder) Low threshold to consult urology IV Rocephin  Chronic kidney disease stage III: This is a baseline. Avoid nephrotoxic's.  Chronic diastolic congestive heart failure: No acute symptoms Continue Lasix 20 Mg p.o. once daily  Diabetes mellitus: Continue to optimize.  Atrial fibrillation on Eliquis: Rate controlled Continue current management  DVT prophylaxis: Eliquis Code Status: Full code Family Communication:  Disposition Plan: Home eventually Consults called: None Admission status: Inpatient  Time spent: 65 minutes  Dana Allan, MD  [facility] Pager #: [phone] 7PM-7AM contact night coverage as above  03/26/2019, 12:12 AM

## 2019-03-27 DIAGNOSIS — I5032 Chronic diastolic (congestive) heart failure: Secondary | ICD-10-CM

## 2019-03-27 DIAGNOSIS — R410 Disorientation, unspecified: Secondary | ICD-10-CM

## 2019-03-27 DIAGNOSIS — R7881 Bacteremia: Secondary | ICD-10-CM

## 2019-03-27 DIAGNOSIS — I248 Other forms of acute ischemic heart disease: Secondary | ICD-10-CM

## 2019-03-27 DIAGNOSIS — I10 Essential (primary) hypertension: Secondary | ICD-10-CM

## 2019-03-27 DIAGNOSIS — N183 Chronic kidney disease, stage 3 (moderate): Secondary | ICD-10-CM

## 2019-03-27 DIAGNOSIS — N1330 Unspecified hydronephrosis: Secondary | ICD-10-CM

## 2019-03-27 DIAGNOSIS — R7989 Other specified abnormal findings of blood chemistry: Secondary | ICD-10-CM

## 2019-03-27 DIAGNOSIS — I482 Chronic atrial fibrillation, unspecified: Secondary | ICD-10-CM

## 2019-03-27 DIAGNOSIS — E1122 Type 2 diabetes mellitus with diabetic chronic kidney disease: Secondary | ICD-10-CM

## 2019-03-27 LAB — GLUCOSE, CAPILLARY
Glucose-Capillary: 84 mg/dL (ref 70–99)
Glucose-Capillary: 86 mg/dL (ref 70–99)
Glucose-Capillary: 179 mg/dL — ABNORMAL HIGH (ref 70–99)
Glucose-Capillary: 108 mg/dL — ABNORMAL HIGH (ref 70–99)
Glucose-Capillary: 107 mg/dL — ABNORMAL HIGH (ref 70–99)

## 2019-03-27 NOTE — Evaluation (Signed)
Occupational Therapy Evaluation Patient Details Name: Courtney Bradford MRN: [id number] DOB: 06-Dec-1930 Today's Date: 03/27/2019    History of Present Illness Pt is an 85 y/o female admitted secondary to AMS. Found to have encephalopathy likely secondary to UTI. CT of head was negative for acute abnormality. Pt with elevated troponin, however, per notes, likely secondary to demand ischemia. PMH includes HTN, dHCF, a fib, DM, CKD, HTN, and gout.    Clinical Impression   Per SW note, patient has 24 hr care at home; 2 nurses aides 7 days a week; no DME needed; Per SW note, pt's son [name] stated that she does not need anything at discharge and will return home. At baseline pt requires assist with ADLs and toileting. Pt able to ambulate with RW to bathroom and mod - min A with toileting and min guad A with hygiene standing at sink. Pt very pleasant and cooperative. All education completed and no further acute OT services are indicated at this time    Follow Up Recommendations  No OT follow up;Supervision/Assistance - 24 hour    Equipment Recommendations  None recommended by OT    Recommendations for Other Services       Precautions / Restrictions Precautions Precautions: Fall Restrictions Weight Bearing Restrictions: No      Mobility Bed Mobility               General bed mobility comments: In chair upon entry.   Transfers Overall transfer level: Needs assistance Equipment used: Rolling walker (2 wheeled) Transfers: Sit to/from Stand Sit to Stand: Mod assist;Min assist         General transfer comment: Mod A for lift assist and steadying. Cues for safe hand placement.     Balance Overall balance assessment: Needs assistance Sitting-balance support: No upper extremity supported;Feet supported Sitting balance-Leahy Scale: Fair     Standing balance support: Bilateral upper extremity supported;During functional activity Standing balance-Leahy Scale: Poor                              ADL either performed or assessed with clinical judgement   ADL Overall ADL's : At baseline                                       General ADL Comments: Pt has caregivers for ADLs 7 days/wk at home     Vision Patient Visual Report: No change from baseline       Perception     Praxis      Pertinent Vitals/Pain Pain Assessment: No/denies pain     Hand Dominance Right   Extremity/Trunk Assessment Upper Extremity Assessment Upper Extremity Assessment: Overall WFL for tasks assessed;Generalized weakness   Lower Extremity Assessment Lower Extremity Assessment: Defer to PT evaluation   Cervical / Trunk Assessment Cervical / Trunk Assessment: Kyphotic   Communication Communication Communication: No difficulties   Cognition Arousal/Alertness: Awake/alert Behavior During Therapy: WFL for tasks assessed/performed Overall Cognitive Status: Impaired/Different from baseline Area of Impairment: Orientation;Memory;Following commands;Problem solving                 Orientation Level: Disoriented to;Time;Situation   Memory: Decreased recall of precautions;Decreased short-term memory Following Commands: Follows one step commands with increased time     Problem Solving: Slow processing;Decreased initiation;Difficulty sequencing;Requires verbal cues;Requires tactile cues General Comments: pt does not know why she  is in the hospital and does remember what nurse call bell is for   General Comments       Exercises     Shoulder Instructions      Home Living Family/patient expects to be discharged to:: Private residence Living Arrangements: Children Available Help at Discharge: Family;Personal care attendant Type of Home: House Home Access: Stairs to enter [employer] of Steps: 1 Entrance Stairs-Rails: None Home Layout: One level         Biochemist, clinical: [place]: Environmental consultant - 2 wheels;Cane -  single point   Additional Comments: Pt reports she lives with her son and has a caretaker all day in the daytime.       Prior Functioning/Environment Level of Independence: Needs assistance  Gait / Transfers Assistance Needed: Used RW for mobility and aide would assist with mobility tasks.  ADL's / Homemaking Assistance Needed: Reports aide assisted with ADLs            OT Problem List: Decreased activity tolerance;Decreased cognition;Decreased knowledge of use of DME or AE;Impaired balance (sitting and/or standing);Decreased knowledge of precautions      OT Treatment/Interventions:      OT Goals(Current goals can be found in the care plan section) Acute Rehab OT Goals Patient Stated Goal: to go back home with my son  OT Frequency:     Barriers to D/C:    no barriers       Co-evaluation              AM-PAC OT "6 Clicks" Daily Activity     Outcome Measure Help from another person eating meals?: None Help from another person taking care of personal grooming?: A Little Help from another person toileting, which includes using toliet, bedpan, or urinal?: A Lot Help from another person bathing (including washing, rinsing, drying)?: A Lot Help from another person to put on and taking off regular upper body clothing?: A Little Help from another person to put on and taking off regular lower body clothing?: A Lot 6 Click Score: 16   End of Session Equipment Utilized During Treatment: Gait belt;Rolling walker  Activity Tolerance: Patient tolerated treatment well Patient left: in chair;with call bell/phone within reach;with chair alarm set  OT Visit Diagnosis: Unsteadiness on feet (R26.81);Other abnormalities of gait and mobility (R26.89);Muscle weakness (generalized) (M62.81);Other symptoms and signs involving cognitive function                Time: 1130-1155 OT Time Calculation (min): 25 min Charges:  OT General Charges $OT Visit: 1 Visit OT Evaluation $OT Eval  Moderate Complexity: 1 Mod OT Treatments $Self Care/Home Management : 8-22 mins    Britt Bottom 03/27/2019, 1:27 PM

## 2019-03-27 NOTE — Progress Notes (Signed)
Progress Note    Courtney Bradford  HGD:[id number] DOB: 1931/03/18  DOA: 03/25/2019 PCP: Iona Beard, MD    Brief Narrative:   Chief complaint: Follow-up fever and AMS.  Medical records reviewed and are as summarized below:  Courtney Bradford is an 85 y.o. female with a PMH of hypertension, gout, diabetes, stage III CKD, diastolic CHF, and atrial fibrillation on chronic anticoagulation with Eliquis who was admitted 03/24/2018 for evaluation of fever associated with vomiting, diarrhea, and acute encephalopathy.  On admission, temperature was 101.6 Fahrenheit and urinalysis was suggestive of UTI.  Cultures were sent and empiric antibiotics were initiated.  Assessment/Plan:   Principal Problem:   Acute encephalopathy secondary to lower urinary tract infectious disease/UTI associated with mild hydroureteronephrosis and gas in the bladder per CT scan findings/ E. Coli and enterobacter bacteremia CT of the head personally reviewed, negative for acute findings, showed atrophy.  CT of the abdomen/pelvis personally reviewed and showed right-sided hydroureteronephrosis.  Urinalysis consistent with acute infection.  She was positive for E. coli and Enterobacter.  Discussed case with Dr. Johnnye Sima of [facility] who recommends 5-7 days of IV antibiotics followed by another week of oral therapy.  Rocephin increased to 2 g IV daily.    PT/OT evaluations obtained with 24-hour supervision and home health PT recommended.  Active Problems:   Essential hypertension Continue Norvasc and Lasix.  Blood pressure controlled.    Atrial fibrillation, chronic (HCC) Continue Eliquis.  Heart rate 44-54.    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  CBG's currently running 81-119.    Normocytic anemia Continue iron supplementation.  Hemoglobin 8.5-9.2 with baseline hemoglobin of 8.7.  Occult blood testing negative.    Chronic diastolic  CHF/elevated troponin/sinus bradycardia/first-degree AV block/mild cardiomegaly/aortic atherosclerosis On telemetry which we will continue.  Continue Lasix.  Troponin 0 0.06, 0.11.  BNP 736.3.  Likely secondary to demand ischemia in the setting of acute infection.  2D echo done 01/23/2019.  EF 50-55%.  No evidence of pulmonary edema on chest x-ray.  EKG showed marked sinus bradycardia at 45 bpm with first-degree AV block.  Atropine ordered as needed. Heart rate 44-54.     Stage III CKD Baseline creatinine appears to be 1.5- 2.0.  Current creatinine consistent with usual baseline values, and the GFR in the 20-35 range.    Hypomagnesemia Repleted 03/26/2019.  Body mass index is 24.58 kg/m.   Family Communication/Anticipated D/C date and plan/Code Status   DVT prophylaxis: Eliquis ordered. Code Status: Full Code.  Family Communication: Son updated by telephone. Disposition Plan: PT recommends home health PT, 24 hour supervision.  Remains inpatient appropriate given + blood cultures and need for IV antibiotics.   Medical Consultants:    None.   Anti-Infectives:    Rocephin 03/26/2019--->  Subjective:   The patient denies SOB, cough, pain. Remains disoriented but alert and without complaints. Appetite good.  Bowels moving.   Objective:    Vitals:   03/26/19 2002 03/26/19 2328 03/27/19 0400 03/27/19 0647  BP: 116/73 134/64 (!) 125/44   Pulse: (!) 45 (!) 54 (!) 44   Resp: 16 17 18    Temp: 98.5 F (36.9 C) 98.3 F (36.8 C) 98.2 F (36.8 C)   TempSrc: Oral Oral Oral   SpO2: 100% 100% 97%   Weight:    69.1 kg  Height:        Intake/Output Summary (Last 24 hours) at 03/27/2019 0759  Last data filed at 03/27/2019 0647 Gross per 24 hour  Intake 967.07 ml  Output 501 ml  Net 466.07 ml   Filed Weights   03/25/19 1811 03/26/19 0222 03/27/19 0647  Weight: 70 kg 67.9 kg 69.1 kg    Exam: General: No acute distress. Sitting up in chair eating breakfast. Cardiovascular: Heart  sounds show a regular rate, and rhythm. No gallops or rubs. 1/VI murmur. No JVD. Lungs: A few bibasilar rales. Abdomen: Soft, nontender, nondistended with normal active bowel sounds. No masses. No hepatosplenomegaly. Neurological: Alert and oriented 1.  Skin: Warm and dry. No rashes or lesions. Extremities: No clubbing or cyanosis. 2+ edema. Pedal pulses 2+.  Data Reviewed:   I have personally reviewed following labs and imaging studies:  Labs: Labs show the following:   Basic Metabolic Panel: Recent Labs  Lab 03/25/19 1802 03/26/19 0340  NA 144 144  K 3.7 4.0  CL 110 109  CO2 22 23  GLUCOSE 82 90  BUN 45* 39*  CREATININE 1.57* 1.44*  CALCIUM 9.4 9.1  MG  --  1.6*  PHOS  --  3.0   GFR Estimated Creatinine Clearance: 26.7 mL/min (A) (by C-G formula based on SCr of 1.44 mg/dL (H)). Liver Function Tests: Recent Labs  Lab 03/25/19 1802  AST 23  ALT 12  ALKPHOS 70  BILITOT 1.0  PROT 6.8  ALBUMIN 3.6   Recent Labs  Lab 03/25/19 1822  LIPASE 24   Coagulation profile Recent Labs  Lab 03/25/19 1802  INR 1.5*    CBC: Recent Labs  Lab 03/25/19 1802 03/26/19 0340  WBC 7.9 16.7*  NEUTROABS 7.5  --   HGB 9.2* 8.5*  HCT 29.5* 27.0*  MCV 90.8 90.9  PLT 174 163   Cardiac Enzymes: Recent Labs  Lab 03/25/19 1822 03/26/19 0340  TROPONINI 0.06* 0.11*   CBG: Recent Labs  Lab 03/26/19 1139 03/26/19 1644 03/26/19 2145 03/27/19 0651 03/27/19 0726  GLUCAP 81 119* 108* 86 84    Sepsis Labs: Recent Labs  Lab 03/25/19 1802 03/26/19 0340  WBC 7.9 16.7*  LATICACIDVEN 2.5* 1.9    Microbiology Recent Results (from the past 240 hour(s))  Culture, blood (Routine x 2)     Status: None (Preliminary result)   Collection Time: 03/25/19  6:03 PM  Result Value Ref Range Status   Specimen Description BLOOD RIGHT ANTECUBITAL  Final   Special Requests   Final    BOTTLES DRAWN AEROBIC AND ANAEROBIC Blood Culture results may not be optimal due to an inadequate  volume of blood received in culture bottles   Culture   Final    NO GROWTH < 24 HOURS Performed at [facility], [place] [address], [place], [place] [address]    Report Status PENDING  Incomplete  Culture, blood (Routine x 2)     Status: None (Preliminary result)   Collection Time: 03/25/19  6:32 PM  Result Value Ref Range Status   Specimen Description BLOOD RIGHT ANTECUBITAL  Final   Special Requests   Final    BOTTLES DRAWN AEROBIC AND ANAEROBIC Blood Culture adequate volume   Culture  Setup Time   Final    GRAM NEGATIVE RODS IN BOTH AEROBIC AND ANAEROBIC BOTTLES CRITICAL RESULT CALLED TO, READ BACK BY AND VERIFIED WITH: E. DEJA PHARMD, AT 1119 03/26/19 BY D. VANHOOK Performed at [facility], [place] [address] [place], [place] [address]    Culture GRAM NEGATIVE RODS  Final   Report  Status PENDING  Incomplete  Blood Culture ID Panel (Reflexed)     Status: Abnormal   Collection Time: 03/25/19  6:32 PM  Result Value Ref Range Status   Enterococcus species NOT DETECTED NOT DETECTED Final   Listeria monocytogenes NOT DETECTED NOT DETECTED Final   Staphylococcus species NOT DETECTED NOT DETECTED Final   Staphylococcus aureus (BCID) NOT DETECTED NOT DETECTED Final   Streptococcus species NOT DETECTED NOT DETECTED Final   Streptococcus agalactiae NOT DETECTED NOT DETECTED Final   Streptococcus pneumoniae NOT DETECTED NOT DETECTED Final   Streptococcus pyogenes NOT DETECTED NOT DETECTED Final   Acinetobacter baumannii NOT DETECTED NOT DETECTED Final   Enterobacteriaceae species DETECTED (A) NOT DETECTED Final    Comment: Enterobacteriaceae represent a large family of gram-negative bacteria, not a single organism. CRITICAL RESULT CALLED TO, READ BACK BY AND VERIFIED WITH: E. DEJA PHARMD, AT 1119 03/26/19 BY D. VANHOOK    Enterobacter cloacae complex NOT DETECTED NOT DETECTED Final   Escherichia coli DETECTED (A) NOT DETECTED Final    Comment: CRITICAL RESULT CALLED TO, READ BACK  BY AND VERIFIED WITH: E. DEJA PHARMD, AT 1119 03/26/19 BY D. VANHOOK    Klebsiella oxytoca NOT DETECTED NOT DETECTED Final   Klebsiella pneumoniae NOT DETECTED NOT DETECTED Final   Proteus species NOT DETECTED NOT DETECTED Final   Serratia marcescens NOT DETECTED NOT DETECTED Final   Carbapenem resistance NOT DETECTED NOT DETECTED Final   Haemophilus influenzae NOT DETECTED NOT DETECTED Final   Neisseria meningitidis NOT DETECTED NOT DETECTED Final   Pseudomonas aeruginosa NOT DETECTED NOT DETECTED Final   Candida albicans NOT DETECTED NOT DETECTED Final   Candida glabrata NOT DETECTED NOT DETECTED Final   Candida krusei NOT DETECTED NOT DETECTED Final   Candida parapsilosis NOT DETECTED NOT DETECTED Final   Candida tropicalis NOT DETECTED NOT DETECTED Final    Comment: Performed at [facility], [place] [address] [place], [place] [address]  SARS Coronavirus 2 (CEPHEID- Performed in [facility] lab), Hosp Order     Status: None   Collection Time: 03/25/19  6:35 PM  Result Value Ref Range Status   SARS Coronavirus 2 NEGATIVE NEGATIVE Final    Comment: (NOTE) If result is NEGATIVE SARS-CoV-2 target nucleic acids are NOT DETECTED. The SARS-CoV-2 RNA is generally detectable in upper and lower  respiratory specimens during the acute phase of infection. The lowest  concentration of SARS-CoV-2 viral copies this assay can detect is 250  copies / mL. A negative result does not preclude SARS-CoV-2 infection  and should not be used as the sole basis for treatment or other  patient management decisions.  A negative result may occur with  improper specimen collection / handling, submission of specimen other  than nasopharyngeal swab, presence of viral mutation(s) within the  areas targeted by this assay, and inadequate number of viral copies  (<250 copies / mL). A negative result must be combined with clinical  observations, patient history, and epidemiological information. If  result is POSITIVE SARS-CoV-2 target nucleic acids are DETECTED. The SARS-CoV-2 RNA is generally detectable in upper and lower  respiratory specimens dur ing the acute phase of infection.  Positive  results are indicative of active infection with SARS-CoV-2.  Clinical  correlation with patient history and other diagnostic information is  necessary to determine patient infection status.  Positive results do  not rule out bacterial infection or co-infection with other viruses. If result is PRESUMPTIVE POSTIVE SARS-CoV-2 nucleic acids MAY BE  PRESENT.   A presumptive positive result was obtained on the submitted specimen  and confirmed on repeat testing.  While 2019 novel coronavirus  (SARS-CoV-2) nucleic acids may be present in the submitted sample  additional confirmatory testing may be necessary for epidemiological  and / or clinical management purposes  to differentiate between  SARS-CoV-2 and other Sarbecovirus currently known to infect humans.  If clinically indicated additional testing with an alternate test  methodology [phone]) is advised. The SARS-CoV-2 RNA is generally  detectable in upper and lower respiratory sp ecimens during the acute  phase of infection. The expected result is Negative. Fact Sheet for Patients:  StrictlyIdeas.no Fact Sheet for Healthcare Providers: BankingDealers.co.za This test is not yet approved or cleared by the [place] FDA and has been authorized for detection and/or diagnosis of SARS-CoV-2 by FDA under an Emergency Use Authorization (EUA).  This EUA will remain in effect (meaning this test can be used) for the duration of the COVID-19 declaration under Section 564(b)(1) of the Act, 21 U.S.C. section 360bbb-3(b)(1), unless the authorization is terminated or revoked sooner. Performed at [facility], [place] [address], [place], [place] [address]   Urine culture     Status: None (Preliminary  result)   Collection Time: 03/25/19  9:31 PM  Result Value Ref Range Status   Specimen Description URINE, CLEAN CATCH  Final   Special Requests NONE  Final   Culture   Final    CULTURE REINCUBATED FOR BETTER GROWTH Performed at [facility], [place] [address], [place], [place] [address]    Report Status PENDING  Incomplete    Procedures and diagnostic studies:  Ct Head Wo Contrast  Result Date: 03/25/2019 CLINICAL DATA:  Decreased level of consciousness EXAM: CT HEAD WITHOUT CONTRAST TECHNIQUE: Contiguous axial images were obtained from the base of the skull through the vertex without intravenous contrast. COMPARISON:  02/29/2016 FINDINGS: Brain: Chronic atrophic and ischemic changes are noted. No findings to suggest acute hemorrhage, acute infarction or space-occupying mass lesion are noted. Vascular: No hyperdense vessel or unexpected calcification. Skull: Normal. Negative for fracture or focal lesion. Sinuses/Orbits: No acute finding. Other: None. IMPRESSION: Chronic atrophic and ischemic changes without acute abnormality. Electronically Signed   By: Inez Catalina M.D.   On: 03/25/2019 21:30   Ct Abdomen Pelvis W Contrast  Result Date: 03/25/2019 CLINICAL DATA:  85 year old female with history of decreased responsiveness. Febrile patient. Confusion. Black tarry stools. Vomiting. EXAM: CT ABDOMEN AND PELVIS WITH CONTRAST TECHNIQUE: Multidetector CT imaging of the abdomen and pelvis was performed using the standard protocol following bolus administration of intravenous contrast. CONTRAST:  194mL OMNIPAQUE IOHEXOL 300 MG/ML  SOLN COMPARISON:  No priors. FINDINGS: Lower chest: Small right Bochdalek's hernia.  Mild cardiomegaly. Hepatobiliary: No suspicious cystic or solid hepatic lesions. No intra or extrahepatic biliary ductal dilatation. Small amount of high attenuation material lying dependently in the gallbladder which may reflect biliary sludge and/or tiny gallstones. No findings to suggest  an acute cholecystitis at this time. Pancreas: No pancreatic mass. No pancreatic ductal dilatation. No pancreatic or peripancreatic fluid or inflammatory changes. Spleen: Unremarkable. Adrenals/Urinary Tract: In the upper pole of the right kidney there is a 1.8 cm low-attenuation lesion, compatible with a simple cyst. Left kidney and bilateral adrenal glands are normal in appearance. Mild left hydroureteronephrosis which terminates in the proximal third of the left ureter. No right hydroureteronephrosis. Small amount of gas non dependently in the lumen of the urinary bladder. Urinary bladder is  otherwise unremarkable in appearance. Stomach/Bowel: Normal appearance of the stomach. No pathologic dilatation of small bowel or colon. Numerous colonic diverticulae are noted, without surrounding inflammatory changes to suggest an acute diverticulitis at this time. Normal appendix. Vascular/Lymphatic: Aortic atherosclerosis, without evidence of aneurysm or dissection in the abdominal or pelvic vasculature. No lymphadenopathy noted in the abdomen or pelvis. Reproductive: Status post hysterectomy. Ovaries are not confidently identified may be surgically absent or atrophic Other: No significant volume of ascites.  No pneumoperitoneum. Musculoskeletal: There are no aggressive appearing lytic or blastic lesions noted in the visualized portions of the skeleton. IMPRESSION: 1. Extensive colonic diverticulosis without evidence to suggest an acute diverticulitis at this time. 2. Mild left hydroureteronephrosis terminating in the proximal third of the left ureter. No obstructing stone identified. The possibility of a ureteral stricture could be considered. 3. Small amount of gas non dependently in the urinary bladder. This may be iatrogenic if there has been recent catheterization. In the absence of catheterization, correlation with urinalysis is suggested to exclude the possibility of urinary tract infection with gas-forming  organisms. 4. Aortic atherosclerosis. 5. Mild cardiomegaly. 6. Trace amount of biliary sludge and/or tiny gallstones lying dependently in the gallbladder. No findings to suggest an acute cholecystitis at this time. Electronically Signed   By: Vinnie Langton M.D.   On: 03/25/2019 21:45   Dg Chest Port 1 View  Result Date: 03/25/2019 CLINICAL DATA:  Fever. EXAM: PORTABLE CHEST 1 VIEW COMPARISON:  Chest x-ray dated July 21, 2014. FINDINGS: Unchanged mild cardiomegaly. Normal mediastinal contours. Normal pulmonary vascularity. No focal consolidation, pleural effusion, or pneumothorax. No acute osseous abnormality. IMPRESSION: No active disease. Electronically Signed   By: Titus Dubin M.D.   On: 03/25/2019 19:13    Medications:    amLODipine  2.5 mg Oral Daily   apixaban  2.5 mg Oral BID   Ferrous Fumarate  106 mg of iron Oral Q breakfast   furosemide  20 mg Oral Q M,W,F   insulin aspart  0-5 Units Subcutaneous QHS   insulin aspart  0-9 Units Subcutaneous TID WC   olopatadine  1 drop Both Eyes BID   Continuous Infusions:  sodium chloride Stopped (03/27/19 0244)   cefTRIAXone (ROCEPHIN)  IV Stopped (03/27/19 0002)     LOS: 1 day    Jacquelynn Cree  [facility] Pager [phone]. If unable to reach me by pager, please call my cell phone at [phone].  *Please refer to [email or web address], password [email or web address] to get updated schedule on who will round on this patient, as hospitalists switch teams weekly. If 7PM-7AM, please contact night-coverage at [email or web address], password [email or web address] for any overnight needs.  03/27/2019, 7:59 AM

## 2019-03-28 LAB — URINE CULTURE: Culture: >=100000 — AB

## 2019-03-28 LAB — CULTURE, BLOOD (ROUTINE X 2): Special Requests: ADEQUATE

## 2019-03-28 LAB — CBC
HCT: 26.5 % — ABNORMAL LOW (ref 36.0–46.0)
Hemoglobin: 8.4 g/dL — ABNORMAL LOW (ref 12.0–15.0)
MCH: 28.8 pg (ref 26.0–34.0)
MCHC: 31.7 g/dL (ref 30.0–36.0)
MCV: 90.8 fL (ref 80.0–100.0)
Platelets: 171 10*3/uL (ref 150–400)
RBC: 2.92 MIL/uL — ABNORMAL LOW (ref 3.87–5.11)
RDW: 14.6 % (ref 11.5–15.5)
WBC: 7.4 10*3/uL (ref 4.0–10.5)
nRBC: 0 % (ref 0.0–0.2)

## 2019-03-28 LAB — BASIC METABOLIC PANEL
Anion gap: 10 (ref 5–15)
BUN: 36 mg/dL — ABNORMAL HIGH (ref 8–23)
CO2: 22 mmol/L (ref 22–32)
Calcium: 9.5 mg/dL (ref 8.9–10.3)
Chloride: 109 mmol/L (ref 98–111)
Creatinine, Ser: 1.37 mg/dL — ABNORMAL HIGH (ref 0.44–1.00)
GFR calc Af Amer: 41 mL/min — ABNORMAL LOW (ref >60)
GFR calc non Af Amer: 35 mL/min — ABNORMAL LOW (ref >60)
Glucose, Bld: 79 mg/dL (ref 70–99)
Potassium: 4.6 mmol/L (ref 3.5–5.1)
Sodium: 141 mmol/L (ref 135–145)

## 2019-03-28 LAB — GLUCOSE, CAPILLARY
Glucose-Capillary: 106 mg/dL — ABNORMAL HIGH (ref 70–99)
Glucose-Capillary: 79 mg/dL (ref 70–99)
Glucose-Capillary: 129 mg/dL — ABNORMAL HIGH (ref 70–99)
Glucose-Capillary: 93 mg/dL (ref 70–99)
Glucose-Capillary: 114 mg/dL — ABNORMAL HIGH (ref 70–99)

## 2019-03-28 MED ORDER — CEFAZOLIN SODIUM-DEXTROSE 2-4 GM/100ML-% IV SOLN
2.0000 g | Freq: Two times a day (BID) | INTRAVENOUS | Status: DC
  Administered 2019-03-28 – 2019-03-30 (×5): 2 g via INTRAVENOUS
  Filled 2019-03-28 (×6): qty 100

## 2019-03-28 NOTE — Care Management Important Message (Signed)
Important Message  Patient Details  Name: Courtney Bradford MRN: [id number] Date of Birth: Nov 06, 1930   Medicare Important Message Given:  Yes    Orbie Pyo 03/28/2019, 12:59 PM

## 2019-03-28 NOTE — Care Management (Signed)
ED CM received call from [facility] on [place] concerning patient needing [facility] orders for discharge [place], orders, patient has been receiving [facility] services from [facility]. CM paged Dr. Rockne Menghini for orders awaiting orders to fax in referral .      Wendi Maya RN BSN ED CM [phone]

## 2019-03-28 NOTE — Plan of Care (Signed)
  Problem: Elimination: Goal: Will not experience complications related to bowel motility Outcome: Progressing   Problem: Skin Integrity: Goal: Risk for impaired skin integrity will decrease Outcome: Progressing   

## 2019-03-28 NOTE — Progress Notes (Addendum)
Progress Note    Courtney Bradford  XTG:[id number] DOB: [birth year]  DOA: 03/25/2019 PCP: Iona Beard, MD    Brief Narrative:   Chief complaint: Follow-up fever and AMS.  Medical records reviewed and are as summarized below:  Courtney Bradford is an 85 y.o. female with a PMH of hypertension, gout, diabetes, stage III CKD, diastolic CHF, and atrial fibrillation on chronic anticoagulation with Eliquis who was admitted 03/24/2018 for evaluation of fever associated with vomiting, diarrhea, and acute encephalopathy.  On admission, temperature was 101.6 Fahrenheit and urinalysis was suggestive of UTI.  Cultures were sent and empiric antibiotics were initiated. Both blood and urine cultures positive. Antibiotics narrowed to Ancef on 03/28/19. ID recommends 5-7 days of IV antibiotics followed by another 7 days of oral antibiotics.  Assessment/Plan:   Principal Problem:   Acute encephalopathy secondary to lower urinary tract infectious disease/UTI associated with mild hydroureteronephrosis and gas in the bladder per CT scan findings/ E. Coli and enterobacter bacteremia CT of the head negative for acute findings, showed atrophy.  CT of the abdomen/pelvis showed right-sided hydroureteronephrosis. Urine cultures grew E. Coli and Klebsiella.  Blood cultures positive for E. coli and Enterobacter.  Discussed case with Dr. Johnnye Sima of [facility] who recommended 5-7 days of IV antibiotics followed by another week of oral therapy. Narrow antibiotics to Ancef based on sensitivity data.  WBC elevation now normalized.  PT/OT evaluations obtained with 24-hour supervision and home health PT recommended.  Active Problems:   Essential hypertension Continue Norvasc and Lasix.  Blood pressure reasonable.    Atrial fibrillation, chronic (HCC) Continue Eliquis.  Heart rate 60-65.    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  CBG's currently  running 79-179.    Normocytic anemia Continue iron supplementation.  Hemoglobin 8.5-9.2 with baseline hemoglobin of 8.7.  Occult blood testing negative.  Hemoglobin remains at baseline values.    Chronic diastolic CHF/elevated troponin/sinus bradycardia/first-degree AV block/mild cardiomegaly/aortic atherosclerosis On telemetry which we will continue.  Continue Lasix.  Troponin 0 0.06, 0.11.  BNP 736.3.  Mildly elevated troponin secondary to demand ischemia in the setting of acute infection.  2D echo done 01/23/2019.  EF 50-55%.  No evidence of pulmonary edema on chest x-ray.  EKG showed marked sinus bradycardia at 45 bpm with first-degree AV block.  Atropine ordered as needed. Heart rate improved and stable.     Stage III CKD Baseline creatinine appears to be 1.5- 2.0.  Current creatinine consistent with usual baseline values, and the GFR in the 20-35 range.    Hypomagnesemia Repleted 03/26/2019.  Body mass index is 24.58 kg/m.   Family Communication/Anticipated D/C date and plan/Code Status   DVT prophylaxis: Eliquis ordered. Code Status: Full Code.  Family Communication: Son updated by telephone. Disposition Plan: PT recommends home health PT, 24 hour supervision.  Remains inpatient appropriate given + blood cultures and need for IV antibiotics.   Medical Consultants:    None.   Anti-Infectives:    Rocephin 03/26/2019--->03/28/19  Ancef 03/28/19--->  Subjective:   Patient reports she is feeling well.  No current complaints of shortness of breath, nausea, vomiting, or diarrhea.  Objective:    Vitals:   03/27/19 0825 03/27/19 1153 03/27/19 2001 03/28/19 0441  BP: (!) 146/89 (!) 154/82 (!) 166/88 (!) 159/63  Pulse: 65 64 64 60  Resp:  16 20 18   Temp:  98.5 F (36.9 C) 98.4 F (36.9 C) 98.6 F (  37 C)  TempSrc:  Oral Oral Oral  SpO2:  100% 100% 100%  Weight:      Height:        Intake/Output Summary (Last 24 hours) at 03/28/2019 0807 Last data filed at 03/28/2019  0559 Gross per 24 hour  Intake 310 ml  Output -  Net 310 ml   Filed Weights   03/25/19 1811 03/26/19 0222 03/27/19 0647  Weight: 70 kg 67.9 kg 69.1 kg    Exam: General: No acute distress. Cardiovascular: Heart sounds show a regular rate, and rhythm. No gallops or rubs. I/VI murmur. No JVD. Lungs: Clear to auscultation bilaterally with good air movement. No rales, rhonchi or wheezes. Abdomen: Soft, nontender, nondistended with normal active bowel sounds. No masses. No hepatosplenomegaly. Skin: Warm and dry. No rashes or lesions. Extremities: No clubbing or cyanosis. 2+ edema. Pedal pulses 2+.   Data Reviewed:   I have personally reviewed following labs and imaging studies:  Labs: Labs show the following:   Basic Metabolic Panel: Recent Labs  Lab 03/25/19 1802 03/26/19 0340 03/28/19 0528  NA 144 144 141  K 3.7 4.0 4.6  CL 110 109 109  CO2 22 23 22   GLUCOSE 82 90 79  BUN 45* 39* 36*  CREATININE 1.57* 1.44* 1.37*  CALCIUM 9.4 9.1 9.5  MG  --  1.6*  --   PHOS  --  3.0  --    GFR Estimated Creatinine Clearance: 28.1 mL/min (A) (by C-G formula based on SCr of 1.37 mg/dL (H)). Liver Function Tests: Recent Labs  Lab 03/25/19 1802  AST 23  ALT 12  ALKPHOS 70  BILITOT 1.0  PROT 6.8  ALBUMIN 3.6   Recent Labs  Lab 03/25/19 1822  LIPASE 24   Coagulation profile Recent Labs  Lab 03/25/19 1802  INR 1.5*    CBC: Recent Labs  Lab 03/25/19 1802 03/26/19 0340 03/28/19 0528  WBC 7.9 16.7* 7.4  NEUTROABS 7.5  --   --   HGB 9.2* 8.5* 8.4*  HCT 29.5* 27.0* 26.5*  MCV 90.8 90.9 90.8  PLT 174 163 171   Cardiac Enzymes: Recent Labs  Lab 03/25/19 1822 03/26/19 0340  TROPONINI 0.06* 0.11*   CBG: Recent Labs  Lab 03/27/19 1146 03/27/19 1621 03/27/19 2234 03/28/19 0640 03/28/19 0727  GLUCAP 179* 108* 107* 79 106*    Sepsis Labs: Recent Labs  Lab 03/25/19 1802 03/26/19 0340 03/28/19 0528  WBC 7.9 16.7* 7.4  LATICACIDVEN 2.5* 1.9  --      Microbiology Recent Results (from the past 240 hour(s))  Culture, blood (Routine x 2)     Status: None (Preliminary result)   Collection Time: 03/25/19  6:03 PM  Result Value Ref Range Status   Specimen Description BLOOD RIGHT ANTECUBITAL  Final   Special Requests   Final    BOTTLES DRAWN AEROBIC AND ANAEROBIC Blood Culture results may not be optimal due to an inadequate volume of blood received in culture bottles   Culture   Final    NO GROWTH 2 DAYS Performed at [facility], [place] [address], [place], [place] [address]    Report Status PENDING  Incomplete  Culture, blood (Routine x 2)     Status: Abnormal (Preliminary result)   Collection Time: 03/25/19  6:32 PM  Result Value Ref Range Status   Specimen Description BLOOD RIGHT ANTECUBITAL  Final   Special Requests   Final    BOTTLES DRAWN AEROBIC AND ANAEROBIC Blood Culture adequate volume  Culture  Setup Time   Final    GRAM NEGATIVE RODS IN BOTH AEROBIC AND ANAEROBIC BOTTLES CRITICAL RESULT CALLED TO, READ BACK BY AND VERIFIED WITH: E. DEJA PHARMD, AT 1119 03/26/19 BY D. VANHOOK    Culture (A)  Final    ESCHERICHIA COLI SUSCEPTIBILITIES TO FOLLOW Performed at [facility], [place] [address], [place], [place] [address]    Report Status PENDING  Incomplete  Blood Culture ID Panel (Reflexed)     Status: Abnormal   Collection Time: 03/25/19  6:32 PM  Result Value Ref Range Status   Enterococcus species NOT DETECTED NOT DETECTED Final   Listeria monocytogenes NOT DETECTED NOT DETECTED Final   Staphylococcus species NOT DETECTED NOT DETECTED Final   Staphylococcus aureus (BCID) NOT DETECTED NOT DETECTED Final   Streptococcus species NOT DETECTED NOT DETECTED Final   Streptococcus agalactiae NOT DETECTED NOT DETECTED Final   Streptococcus pneumoniae NOT DETECTED NOT DETECTED Final   Streptococcus pyogenes NOT DETECTED NOT DETECTED Final   Acinetobacter baumannii NOT DETECTED NOT DETECTED Final   Enterobacteriaceae  species DETECTED (A) NOT DETECTED Final    Comment: Enterobacteriaceae represent a large family of gram-negative bacteria, not a single organism. CRITICAL RESULT CALLED TO, READ BACK BY AND VERIFIED WITH: E. DEJA PHARMD, AT 1119 03/26/19 BY D. VANHOOK    Enterobacter cloacae complex NOT DETECTED NOT DETECTED Final   Escherichia coli DETECTED (A) NOT DETECTED Final    Comment: CRITICAL RESULT CALLED TO, READ BACK BY AND VERIFIED WITH: E. DEJA PHARMD, AT 1119 03/26/19 BY D. VANHOOK    Klebsiella oxytoca NOT DETECTED NOT DETECTED Final   Klebsiella pneumoniae NOT DETECTED NOT DETECTED Final   Proteus species NOT DETECTED NOT DETECTED Final   Serratia marcescens NOT DETECTED NOT DETECTED Final   Carbapenem resistance NOT DETECTED NOT DETECTED Final   Haemophilus influenzae NOT DETECTED NOT DETECTED Final   Neisseria meningitidis NOT DETECTED NOT DETECTED Final   Pseudomonas aeruginosa NOT DETECTED NOT DETECTED Final   Candida albicans NOT DETECTED NOT DETECTED Final   Candida glabrata NOT DETECTED NOT DETECTED Final   Candida krusei NOT DETECTED NOT DETECTED Final   Candida parapsilosis NOT DETECTED NOT DETECTED Final   Candida tropicalis NOT DETECTED NOT DETECTED Final    Comment: Performed at [facility], [place] [address] [place], [place] [address]  SARS Coronavirus 2 (CEPHEID- Performed in [place] [facility] lab), Hosp Order     Status: None   Collection Time: 03/25/19  6:35 PM  Result Value Ref Range Status   SARS Coronavirus 2 NEGATIVE NEGATIVE Final    Comment: (NOTE) If result is NEGATIVE SARS-CoV-2 target nucleic acids are NOT DETECTED. The SARS-CoV-2 RNA is generally detectable in upper and lower  respiratory specimens during the acute phase of infection. The lowest  concentration of SARS-CoV-2 viral copies this assay can detect is 250  copies / mL. A negative result does not preclude SARS-CoV-2 infection  and should not be used as the sole basis for treatment or other   patient management decisions.  A negative result may occur with  improper specimen collection / handling, submission of specimen other  than nasopharyngeal swab, presence of viral mutation(s) within the  areas targeted by this assay, and inadequate number of viral copies  (<250 copies / mL). A negative result must be combined with clinical  observations, patient history, and epidemiological information. If result is POSITIVE SARS-CoV-2 target nucleic acids are DETECTED. The SARS-CoV-2 RNA is generally  detectable in upper and lower  respiratory specimens dur ing the acute phase of infection.  Positive  results are indicative of active infection with SARS-CoV-2.  Clinical  correlation with patient history and other diagnostic information is  necessary to determine patient infection status.  Positive results do  not rule out bacterial infection or co-infection with other viruses. If result is PRESUMPTIVE POSTIVE SARS-CoV-2 nucleic acids MAY BE PRESENT.   A presumptive positive result was obtained on the submitted specimen  and confirmed on repeat testing.  While 2019 novel coronavirus  (SARS-CoV-2) nucleic acids may be present in the submitted sample  additional confirmatory testing may be necessary for epidemiological  and / or clinical management purposes  to differentiate between  SARS-CoV-2 and other Sarbecovirus currently known to infect humans.  If clinically indicated additional testing with an alternate test  methodology [phone]) is advised. The SARS-CoV-2 RNA is generally  detectable in upper and lower respiratory sp ecimens during the acute  phase of infection. The expected result is Negative. Fact Sheet for Patients:  StrictlyIdeas.no Fact Sheet for Healthcare Providers: BankingDealers.co.za This test is not yet approved or cleared by the [place] FDA and has been authorized for detection and/or diagnosis of SARS-CoV-2 by  FDA under an Emergency Use Authorization (EUA).  This EUA will remain in effect (meaning this test can be used) for the duration of the COVID-19 declaration under Section 564(b)(1) of the Act, 21 U.S.C. section 360bbb-3(b)(1), unless the authorization is terminated or revoked sooner. Performed at [facility], [place] [address], [place], [place] [address]   Urine culture     Status: Abnormal (Preliminary result)   Collection Time: 03/25/19  9:31 PM  Result Value Ref Range Status   Specimen Description URINE, CLEAN CATCH  Final   Special Requests NONE  Final   Culture (A)  Final    >=100,000 COLONIES/mL ESCHERICHIA COLI >=100,000 COLONIES/mL KLEBSIELLA PNEUMONIAE SUSCEPTIBILITIES TO FOLLOW Performed at [facility], [address], [place], [place] [address]    Report Status PENDING  Incomplete    Procedures and diagnostic studies:  No results found.  Medications:   . amLODipine  2.5 mg Oral Daily  . apixaban  2.5 mg Oral BID  . Ferrous Fumarate  106 mg of iron Oral Q breakfast  . furosemide  20 mg Oral Q M,W,F  . insulin aspart  0-5 Units Subcutaneous QHS  . insulin aspart  0-9 Units Subcutaneous TID WC  . olopatadine  1 drop Both Eyes BID   Continuous Infusions: . sodium chloride Stopped (03/27/19 0244)  . cefTRIAXone (ROCEPHIN)  IV 2 g (03/27/19 2031)     LOS: 2 days    Jacquelynn Cree  [facility] Pager [phone]. If unable to reach me by pager, please call my cell phone at [phone].  *Please refer to [email or web address], password [email or web address] to get updated schedule on who will round on this patient, as hospitalists switch teams weekly. If 7PM-7AM, please contact night-coverage at [email or web address], password [email or web address] for any overnight needs.  03/28/2019, 8:07 AM

## 2019-03-28 NOTE — Progress Notes (Signed)
Physical Therapy Treatment Patient Details Name: Courtney Bradford MRN: [id number] DOB: November 15, 1930 Today's Date: 03/28/2019    History of Present Illness Pt is an 85 y/o female admitted secondary to AMS. Found to have encephalopathy likely secondary to UTI. CT of head was negative for acute abnormality. Pt with elevated troponin, however, per notes, likely secondary to demand ischemia. PMH includes HTN, dHCF, a fib, DM, CKD, HTN, and gout.     PT Comments    Patient seen for mobility progression. Patient ambulating short distance to restroom and back to recliner. Min A to stand from recliner and toilet. Min guard throughout gait for safety and steadying. Flexed posture throughout - patient reports this at baseline. Will continue to follow.     Follow Up Recommendations  Supervision/Assistance - 24 hour;Home health PT     Equipment Recommendations  None recommended by PT    Recommendations for Other Services       Precautions / Restrictions Precautions Precautions: Fall Restrictions Weight Bearing Restrictions: No    Mobility  Bed Mobility               General bed mobility comments: In chair upon entry.   Transfers Overall transfer level: Needs assistance Equipment used: Rolling walker (2 wheeled) Transfers: Sit to/from Stand Sit to Stand: Min assist         General transfer comment: Min A to power up from standing; cueing for hand placement  Ambulation/Gait Ambulation/Gait assistance: Min guard Gait Distance (Feet): 15 Feet(x2) Assistive device: Rolling walker (2 wheeled) Gait Pattern/deviations: Step-through pattern;Decreased stride length;Trunk flexed Gait velocity: Decreased    General Gait Details: slow pace of gait; min guard for balance and steadying   Stairs             Wheelchair Mobility    Modified Rankin (Stroke Patients Only)       Balance Overall balance assessment: Needs assistance Sitting-balance support: No upper extremity  supported;Feet supported Sitting balance-Leahy Scale: Fair     Standing balance support: Bilateral upper extremity supported;During functional activity Standing balance-Leahy Scale: Poor Standing balance comment: Reliant on BUE support                             Cognition Arousal/Alertness: Awake/alert Behavior During Therapy: WFL for tasks assessed/performed Overall Cognitive Status: Within Functional Limits for tasks assessed                                        Exercises      General Comments        Pertinent Vitals/Pain Pain Assessment: No/denies pain    Home Living                      Prior Function            PT Goals (current goals can now be found in the care plan section) Acute Rehab PT Goals Patient Stated Goal: to go back home with my son PT Goal Formulation: With patient Time For Goal Achievement: 04/09/19 Potential to Achieve Goals: Good Progress towards PT goals: Progressing toward goals    Frequency    Min 3X/week      PT Plan Current plan remains appropriate    Co-evaluation              AM-PAC PT "6 Clicks"  Mobility   Outcome Measure  Help needed turning from your back to your side while in a flat bed without using bedrails?: A Little Help needed moving from lying on your back to sitting on the side of a flat bed without using bedrails?: A Little Help needed moving to and from a bed to a chair (including a wheelchair)?: A Little Help needed standing up from a chair using your arms (e.g., wheelchair or bedside chair)?: A Little Help needed to walk in hospital room?: A Little Help needed climbing 3-5 steps with a railing? : A Lot 6 Click Score: 17    End of Session Equipment Utilized During Treatment: Gait belt Activity Tolerance: Patient tolerated treatment well Patient left: in chair;with call bell/phone within reach;with chair alarm set Nurse Communication: Mobility status PT Visit  Diagnosis: Other abnormalities of gait and mobility (R26.89);Muscle weakness (generalized) (M62.81);Unsteadiness on feet (R26.81)     Time: 7473-4037 PT Time Calculation (min) (ACUTE ONLY): 19 min  Charges:  $Gait Training: 8-22 mins                     Lanney Gins, PT, DPT Supplemental Physical Therapist 03/28/19 2:26 PM Pager: [phone] Office: [phone]

## 2019-03-28 NOTE — Progress Notes (Addendum)
Patient's son Saleen Peden is at bedside. Per pt's son and upon assessment wound consult was put in for advice in wrapping pts finger in left hand, looks intact with sutures.  Per pt son would like a case manager consult for [facility] services: PT/OT/RN and home health aide. Paged Mariann Laster CM for recommendations. Jori Moll is aware and requested to ask CM to call him.   MD Rama paged to make aware.

## 2019-03-28 NOTE — Progress Notes (Signed)
Spoke with pt's son Jori Moll regarding pt's plan of care with pt's permission. Questions and concerns were answered.

## 2019-03-29 LAB — GLUCOSE, CAPILLARY
Glucose-Capillary: 84 mg/dL (ref 70–99)
Glucose-Capillary: 106 mg/dL — ABNORMAL HIGH (ref 70–99)
Glucose-Capillary: 123 mg/dL — ABNORMAL HIGH (ref 70–99)
Glucose-Capillary: 142 mg/dL — ABNORMAL HIGH (ref 70–99)

## 2019-03-29 MED ORDER — COLCHICINE 0.6 MG PO TABS
1.2000 mg | ORAL_TABLET | Freq: Once | ORAL | Status: AC
  Administered 2019-03-29: 1.2 mg via ORAL
  Filled 2019-03-29: qty 2

## 2019-03-29 MED ORDER — ACETAMINOPHEN 325 MG PO TABS
325.0000 mg | ORAL_TABLET | Freq: Four times a day (QID) | ORAL | Status: DC | PRN
  Administered 2019-03-29 – 2019-03-31 (×3): 325 mg via ORAL
  Filled 2019-03-29 (×3): qty 1

## 2019-03-29 MED ORDER — COLCHICINE 0.6 MG PO TABS
0.6000 mg | ORAL_TABLET | Freq: Every day | ORAL | Status: DC
  Administered 2019-03-30 – 2019-04-01 (×3): 0.6 mg via ORAL
  Filled 2019-03-29 (×3): qty 1

## 2019-03-29 NOTE — Progress Notes (Addendum)
Patient's son reported that Courtney Bradford has always been  on colchicine for gout.   She has had problems in past where she has been off the medication and she has not been able to walk or move for days   Son and PCP have talked about risk for patient's renal function.  Called MD  MD returned call- gave verbal order to give loading dose of 1.2 mg now and restart maintenance dose tomorrow.

## 2019-03-29 NOTE — Progress Notes (Signed)
Patient temperature is up a little.   100.3 F  Just gave tylenol at 5:39pm   Paged MD- he is aware- wants to continue to monitor temperature for now since patient is on IV antibiotics.

## 2019-03-29 NOTE — Progress Notes (Signed)
Patient bed alarm went off-  Went to check on patient she was sitting on side of bed trying to get up.   Helped patient to bedside commode with use of gate belt and walker.  Patient seems a little weaker today (2+ assist needed).     Got patient back in bed with alarm on and one side rail up.  Encouraged patient to press call bell if needs anything.

## 2019-03-29 NOTE — Progress Notes (Signed)
Marland Kitchen  PROGRESS NOTE    Courtney Bradford  TIR:[id number] DOB: Feb 27, 1931 DOA: 03/25/2019 PCP: Iona Beard, MD   Brief Narrative:   Courtney Bradford is an 85 y.o. female with a PMH of hypertension, gout, diabetes, stage III CKD, diastolic CHF, and atrial fibrillation on chronic anticoagulation with Eliquis who was admitted 03/24/2018 for evaluation of fever associated with vomiting, diarrhea, and acute encephalopathy.  On admission, temperature was 101.6 Fahrenheit and urinalysis was suggestive of UTI.  Cultures were sent and empiric antibiotics were initiated.   Assessment & Plan:   Principal Problem:   Acute encephalopathy Active Problems:   Essential hypertension   Chronic diastolic CHF (congestive heart failure) (HCC)   Atrial fibrillation, chronic (HCC)   Type 2 diabetes, controlled, with renal manifestation (HCC)   Lower urinary tract infectious disease   CKD (chronic kidney disease), stage III (HCC)   Hypomagnesemia   Hydroureteronephrosis   Elevated troponin   Demand ischemia (HCC)   Sinus bradycardia   First degree AV block   Aortic atherosclerosis (HCC)   Mild cardiomegaly   Bacteremia due to Gram-negative bacteria   Acute encephalopathy secondary to lower urinary tract infectious disease/UTI associated with mild hydroureteronephrosis and gas in the bladder per CT scan findings/ E. Coli and enterobacter bacteremia     - CT of the head negative for acute findings, showed atrophy.       - CT of the abdomen/pelvis showed right-sided hydroureteronephrosis.       - Urinalysis consistent with acute infection; positive for E. coli and Enterobacter.       - ID recs 5-7 days of IV antibiotics followed by another week of oral therapy.       - Rocephin 2 g IV daily.         - PT/OT evaluations obtained with 24-hour supervision and home health PT recommended.  Essential hypertension     - Continue Norvasc and Lasix.  Atrial fibrillation, chronic (HCC)     - Continue Eliquis.   Diabetes mellitus type 2, controlled ([place]) associated with renal complications     - insulin sensitive SSI; monitor sugars  Normocytic anemia     - Continue iron supplementation.       - baseline hemoglobin of 8.7.       - Occult blood testing negative.  Chronic diastolic CHF/elevated troponin/sinus bradycardia/first-degree AV block/mild cardiomegaly/aortic atherosclerosis     - On telemetry     - Continue Lasix.     - Likely secondary to demand ischemia in the setting of acute infection.       - No evidence of pulmonary edema on chest x-ray.       - EKG showed marked sinus bradycardia at 45 bpm with first-degree AV block.     - stable today, denies any complaints   Stage III CKD     - Baseline creatinine appears to be 1.5- 2.0.   Hypomagnesemia     - Repleted 03/26/2019.   DVT prophylaxis: eliquis Code Status: FULL   Disposition Plan: TBD   Antimicrobials:  . Rocephin    Subjective: "I'm ok"  Objective: Vitals:   03/29/19 0603 03/29/19 0608 03/29/19 1049 03/29/19 1110  BP: (!) 146/70  (!) 159/82 (!) 152/73  Pulse: 67  73 66  Resp: 20   18  Temp: 97.8 F (36.6 C)   99.4 F (37.4 C)  TempSrc:    Oral  SpO2: 100%  100% 100%  Weight:  67 kg  Height:        Intake/Output Summary (Last 24 hours) at 03/29/2019 1512 Last data filed at 03/29/2019 1453 Gross per 24 hour  Intake 249.49 ml  Output 1200 ml  Net -950.51 ml   Filed Weights   03/26/19 0222 03/27/19 0647 03/29/19 3267  Weight: 67.9 kg 69.1 kg 67 kg    Examination:  General exam: 85 y.o. female Appears calm and comfortable  Respiratory system: Clear to auscultation. Respiratory effort normal. Cardiovascular system: S1 & S2 heard, RRR. 1/6 SEM, No JVD, rubs, gallops or clicks. No pedal edema. Gastrointestinal system: Abdomen is nondistended, soft and nontender. No organomegaly or masses felt. Normal bowel sounds heard. Central nervous system: Alert and oriented. No focal neurological deficits.  Extremities: Symmetric 5 x 5 power.    Data Reviewed: I have personally reviewed following labs and imaging studies.  CBC: Recent Labs  Lab 03/25/19 1802 03/26/19 0340 03/28/19 0528  WBC 7.9 16.7* 7.4  NEUTROABS 7.5  --   --   HGB 9.2* 8.5* 8.4*  HCT 29.5* 27.0* 26.5*  MCV 90.8 90.9 90.8  PLT 174 163 124   Basic Metabolic Panel: Recent Labs  Lab 03/25/19 1802 03/26/19 0340 03/28/19 0528  NA 144 144 141  K 3.7 4.0 4.6  CL 110 109 109  CO2 22 23 22   GLUCOSE 82 90 79  BUN 45* 39* 36*  CREATININE 1.57* 1.44* 1.37*  CALCIUM 9.4 9.1 9.5  MG  --  1.6*  --   PHOS  --  3.0  --    GFR: Estimated Creatinine Clearance: 28.1 mL/min (A) (by C-G formula based on SCr of 1.37 mg/dL (H)). Liver Function Tests: Recent Labs  Lab 03/25/19 1802  AST 23  ALT 12  ALKPHOS 70  BILITOT 1.0  PROT 6.8  ALBUMIN 3.6   Recent Labs  Lab 03/25/19 1822  LIPASE 24   No results for input(s): AMMONIA in the last 168 hours. Coagulation Profile: Recent Labs  Lab 03/25/19 1802  INR 1.5*   Cardiac Enzymes: Recent Labs  Lab 03/25/19 1822 03/26/19 0340  TROPONINI 0.06* 0.11*   BNP (last 3 results) No results for input(s): PROBNP in the last 8760 hours. HbA1C: No results for input(s): HGBA1C in the last 72 hours. CBG: Recent Labs  Lab 03/28/19 1123 03/28/19 1649 03/28/19 2111 03/29/19 0641 03/29/19 1109  GLUCAP 129* 93 114* 84 106*   Lipid Profile: No results for input(s): CHOL, HDL, LDLCALC, TRIG, CHOLHDL, LDLDIRECT in the last 72 hours. Thyroid Function Tests: No results for input(s): TSH, T4TOTAL, FREET4, T3FREE, THYROIDAB in the last 72 hours. Anemia Panel: No results for input(s): VITAMINB12, FOLATE, FERRITIN, TIBC, IRON, RETICCTPCT in the last 72 hours. Sepsis Labs: Recent Labs  Lab 03/25/19 1802 03/26/19 0340  LATICACIDVEN 2.5* 1.9    Recent Results (from the past 240 hour(s))  Culture, blood (Routine x 2)     Status: None (Preliminary result)   Collection  Time: 03/25/19  6:03 PM  Result Value Ref Range Status   Specimen Description BLOOD RIGHT ANTECUBITAL  Final   Special Requests   Final    BOTTLES DRAWN AEROBIC AND ANAEROBIC Blood Culture results may not be optimal due to an inadequate volume of blood received in culture bottles   Culture   Final    NO GROWTH 4 DAYS Performed at [facility], [place] [address] [place], [place] [address]    Report Status PENDING  Incomplete  Culture, blood (Routine x 2)  Status: Abnormal   Collection Time: 03/25/19  6:32 PM  Result Value Ref Range Status   Specimen Description BLOOD RIGHT ANTECUBITAL  Final   Special Requests   Final    BOTTLES DRAWN AEROBIC AND ANAEROBIC Blood Culture adequate volume   Culture  Setup Time   Final    GRAM NEGATIVE RODS IN BOTH AEROBIC AND ANAEROBIC BOTTLES CRITICAL RESULT CALLED TO, READ BACK BY AND VERIFIED WITH: E. DEJA PHARMD, AT 1119 03/26/19 BY D. VANHOOK Performed at [facility], [place] [address] [place], [place] [address]    Culture ESCHERICHIA COLI (A)  Final   Report Status 03/28/2019 FINAL  Final   Organism ID, Bacteria ESCHERICHIA COLI  Final      Susceptibility   Escherichia coli - MIC*    AMPICILLIN <=2 SENSITIVE Sensitive     CEFAZOLIN <=4 SENSITIVE Sensitive     CEFEPIME <=1 SENSITIVE Sensitive     CEFTAZIDIME <=1 SENSITIVE Sensitive     CEFTRIAXONE <=1 SENSITIVE Sensitive     CIPROFLOXACIN <=0.25 SENSITIVE Sensitive     GENTAMICIN <=1 SENSITIVE Sensitive     IMIPENEM <=0.25 SENSITIVE Sensitive     TRIMETH/SULFA <=20 SENSITIVE Sensitive     AMPICILLIN/SULBACTAM <=2 SENSITIVE Sensitive     PIP/TAZO <=4 SENSITIVE Sensitive     Extended ESBL NEGATIVE Sensitive     * ESCHERICHIA COLI  Blood Culture ID Panel (Reflexed)     Status: Abnormal   Collection Time: 03/25/19  6:32 PM  Result Value Ref Range Status   Enterococcus species NOT DETECTED NOT DETECTED Final   Listeria monocytogenes NOT DETECTED NOT DETECTED Final   Staphylococcus  species NOT DETECTED NOT DETECTED Final   Staphylococcus aureus (BCID) NOT DETECTED NOT DETECTED Final   Streptococcus species NOT DETECTED NOT DETECTED Final   Streptococcus agalactiae NOT DETECTED NOT DETECTED Final   Streptococcus pneumoniae NOT DETECTED NOT DETECTED Final   Streptococcus pyogenes NOT DETECTED NOT DETECTED Final   Acinetobacter baumannii NOT DETECTED NOT DETECTED Final   Enterobacteriaceae species DETECTED (A) NOT DETECTED Final    Comment: Enterobacteriaceae represent a large family of gram-negative bacteria, not a single organism. CRITICAL RESULT CALLED TO, READ BACK BY AND VERIFIED WITH: E. DEJA PHARMD, AT 1119 03/26/19 BY D. VANHOOK    Enterobacter cloacae complex NOT DETECTED NOT DETECTED Final   Escherichia coli DETECTED (A) NOT DETECTED Final    Comment: CRITICAL RESULT CALLED TO, READ BACK BY AND VERIFIED WITH: E. DEJA PHARMD, AT 1119 03/26/19 BY D. VANHOOK    Klebsiella oxytoca NOT DETECTED NOT DETECTED Final   Klebsiella pneumoniae NOT DETECTED NOT DETECTED Final   Proteus species NOT DETECTED NOT DETECTED Final   Serratia marcescens NOT DETECTED NOT DETECTED Final   Carbapenem resistance NOT DETECTED NOT DETECTED Final   Haemophilus influenzae NOT DETECTED NOT DETECTED Final   Neisseria meningitidis NOT DETECTED NOT DETECTED Final   Pseudomonas aeruginosa NOT DETECTED NOT DETECTED Final   Candida albicans NOT DETECTED NOT DETECTED Final   Candida glabrata NOT DETECTED NOT DETECTED Final   Candida krusei NOT DETECTED NOT DETECTED Final   Candida parapsilosis NOT DETECTED NOT DETECTED Final   Candida tropicalis NOT DETECTED NOT DETECTED Final    Comment: Performed at [facility], [place] [address], [place], [place] [address]  SARS Coronavirus 2 (CEPHEID- Performed in [facility] hospital lab), Hosp Order     Status: None   Collection Time: 03/25/19  6:35 PM  Result Value Ref Range  Status   SARS Coronavirus 2 NEGATIVE NEGATIVE Final    Comment: (NOTE)  If result is NEGATIVE SARS-CoV-2 target nucleic acids are NOT DETECTED. The SARS-CoV-2 RNA is generally detectable in upper and lower  respiratory specimens during the acute phase of infection. The lowest  concentration of SARS-CoV-2 viral copies this assay can detect is 250  copies / mL. A negative result does not preclude SARS-CoV-2 infection  and should not be used as the sole basis for treatment or other  patient management decisions.  A negative result may occur with  improper specimen collection / handling, submission of specimen other  than nasopharyngeal swab, presence of viral mutation(s) within the  areas targeted by this assay, and inadequate number of viral copies  (<250 copies / mL). A negative result must be combined with clinical  observations, patient history, and epidemiological information. If result is POSITIVE SARS-CoV-2 target nucleic acids are DETECTED. The SARS-CoV-2 RNA is generally detectable in upper and lower  respiratory specimens dur ing the acute phase of infection.  Positive  results are indicative of active infection with SARS-CoV-2.  Clinical  correlation with patient history and other diagnostic information is  necessary to determine patient infection status.  Positive results do  not rule out bacterial infection or co-infection with other viruses. If result is PRESUMPTIVE POSTIVE SARS-CoV-2 nucleic acids MAY BE PRESENT.   A presumptive positive result was obtained on the submitted specimen  and confirmed on repeat testing.  While 2019 novel coronavirus  (SARS-CoV-2) nucleic acids may be present in the submitted sample  additional confirmatory testing may be necessary for epidemiological  and / or clinical management purposes  to differentiate between  SARS-CoV-2 and other Sarbecovirus currently known to infect humans.  If clinically indicated additional testing with an alternate test  methodology [phone]) is advised. The SARS-CoV-2 RNA is generally   detectable in upper and lower respiratory sp ecimens during the acute  phase of infection. The expected result is Negative. Fact Sheet for Patients:  StrictlyIdeas.no Fact Sheet for Healthcare Providers: BankingDealers.co.za This test is not yet approved or cleared by the [place] FDA and has been authorized for detection and/or diagnosis of SARS-CoV-2 by FDA under an Emergency Use Authorization (EUA).  This EUA will remain in effect (meaning this test can be used) for the duration of the COVID-19 declaration under Section 564(b)(1) of the Act, 21 U.S.C. section 360bbb-3(b)(1), unless the authorization is terminated or revoked sooner. Performed at [facility], [place] [address] [place], [place] [address]   Urine culture     Status: Abnormal   Collection Time: 03/25/19  9:31 PM  Result Value Ref Range Status   Specimen Description URINE, CLEAN CATCH  Final   Special Requests   Final    NONE Performed at [facility], [place] [address] [place], [place] [address]    Culture (A)  Final    >=100,000 COLONIES/mL ESCHERICHIA COLI >=100,000 COLONIES/mL KLEBSIELLA PNEUMONIAE    Report Status 03/28/2019 FINAL  Final   Organism ID, Bacteria ESCHERICHIA COLI (A)  Final   Organism ID, Bacteria KLEBSIELLA PNEUMONIAE (A)  Final      Susceptibility   Escherichia coli - MIC*    AMPICILLIN 16 INTERMEDIATE Intermediate     CEFAZOLIN <=4 SENSITIVE Sensitive     CEFTRIAXONE <=1 SENSITIVE Sensitive     CIPROFLOXACIN >=4 RESISTANT Resistant     GENTAMICIN <=1 SENSITIVE Sensitive     IMIPENEM <=0.25 SENSITIVE Sensitive  NITROFURANTOIN 32 SENSITIVE Sensitive     TRIMETH/SULFA <=20 SENSITIVE Sensitive     AMPICILLIN/SULBACTAM 8 SENSITIVE Sensitive     PIP/TAZO <=4 SENSITIVE Sensitive     Extended ESBL NEGATIVE Sensitive     * >=100,000 COLONIES/mL ESCHERICHIA COLI   Klebsiella pneumoniae - MIC*    AMPICILLIN >=32 RESISTANT Resistant      CEFAZOLIN <=4 SENSITIVE Sensitive     CEFTRIAXONE <=1 SENSITIVE Sensitive     CIPROFLOXACIN <=0.25 SENSITIVE Sensitive     GENTAMICIN <=1 SENSITIVE Sensitive     IMIPENEM <=0.25 SENSITIVE Sensitive     NITROFURANTOIN 64 INTERMEDIATE Intermediate     TRIMETH/SULFA <=20 SENSITIVE Sensitive     AMPICILLIN/SULBACTAM 4 SENSITIVE Sensitive     PIP/TAZO <=4 SENSITIVE Sensitive     Extended ESBL NEGATIVE Sensitive     * >=100,000 COLONIES/mL KLEBSIELLA PNEUMONIAE         Radiology Studies: No results found.      Scheduled Meds: . amLODipine  2.5 mg Oral Daily  . apixaban  2.5 mg Oral BID  . Ferrous Fumarate  106 mg of iron Oral Q breakfast  . furosemide  20 mg Oral Q M,W,F  . insulin aspart  0-5 Units Subcutaneous QHS  . insulin aspart  0-9 Units Subcutaneous TID WC  . olopatadine  1 drop Both Eyes BID   Continuous Infusions: . sodium chloride Stopped (03/29/19 1257)  .  ceFAZolin (ANCEF) IV Stopped (03/29/19 1220)     LOS: 3 days    Time spent: 25 minutes spent in the coordination of care today.    Jonnie Finner, DO [facility] Pager [phone]  If 7PM-7AM, please contact night-coverage [email or web address] Password [email or web address] 03/29/2019, 3:12 PM

## 2019-03-30 LAB — CBC WITH DIFFERENTIAL/PLATELET
Abs Immature Granulocytes: 0.1 10*3/uL — ABNORMAL HIGH (ref 0.00–0.07)
Basophils Absolute: 0.1 10*3/uL (ref 0.0–0.1)
Basophils Relative: 1 %
Eosinophils Absolute: 0.1 10*3/uL (ref 0.0–0.5)
Eosinophils Relative: 1 %
HCT: 28.2 % — ABNORMAL LOW (ref 36.0–46.0)
Hemoglobin: 8.9 g/dL — ABNORMAL LOW (ref 12.0–15.0)
Immature Granulocytes: 1 %
Lymphocytes Relative: 13 %
Lymphs Abs: 1.1 10*3/uL (ref 0.7–4.0)
MCH: 27.7 pg (ref 26.0–34.0)
MCHC: 31.6 g/dL (ref 30.0–36.0)
MCV: 87.9 fL (ref 80.0–100.0)
Monocytes Absolute: 0.9 10*3/uL (ref 0.1–1.0)
Monocytes Relative: 10 %
Neutro Abs: 6.7 10*3/uL (ref 1.7–7.7)
Neutrophils Relative %: 74 %
Platelets: 208 10*3/uL (ref 150–400)
RBC: 3.21 MIL/uL — ABNORMAL LOW (ref 3.87–5.11)
RDW: 14.3 % (ref 11.5–15.5)
WBC: 8.9 10*3/uL (ref 4.0–10.5)
nRBC: 0 % (ref 0.0–0.2)

## 2019-03-30 LAB — RENAL FUNCTION PANEL
Albumin: 3.1 g/dL — ABNORMAL LOW (ref 3.5–5.0)
Anion gap: 10 (ref 5–15)
BUN: 24 mg/dL — ABNORMAL HIGH (ref 8–23)
CO2: 23 mmol/L (ref 22–32)
Calcium: 9.3 mg/dL (ref 8.9–10.3)
Chloride: 105 mmol/L (ref 98–111)
Creatinine, Ser: 1.26 mg/dL — ABNORMAL HIGH (ref 0.44–1.00)
GFR calc Af Amer: 45 mL/min — ABNORMAL LOW (ref >60)
GFR calc non Af Amer: 39 mL/min — ABNORMAL LOW (ref >60)
Glucose, Bld: 116 mg/dL — ABNORMAL HIGH (ref 70–99)
Phosphorus: 2 mg/dL — ABNORMAL LOW (ref 2.5–4.6)
Potassium: 4.2 mmol/L (ref 3.5–5.1)
Sodium: 138 mmol/L (ref 135–145)

## 2019-03-30 LAB — GLUCOSE, CAPILLARY
Glucose-Capillary: 123 mg/dL — ABNORMAL HIGH (ref 70–99)
Glucose-Capillary: 161 mg/dL — ABNORMAL HIGH (ref 70–99)
Glucose-Capillary: 119 mg/dL — ABNORMAL HIGH (ref 70–99)
Glucose-Capillary: 207 mg/dL — ABNORMAL HIGH (ref 70–99)

## 2019-03-30 LAB — CULTURE, BLOOD (ROUTINE X 2): Culture: NO GROWTH

## 2019-03-30 LAB — MAGNESIUM: Magnesium: 1.5 mg/dL — ABNORMAL LOW (ref 1.7–2.4)

## 2019-03-30 MED ORDER — HYDRALAZINE HCL 20 MG/ML IJ SOLN: 5.0000 mg | Freq: Four times a day (QID) | INTRAMUSCULAR | Status: DC | PRN

## 2019-03-30 MED ORDER — MAGNESIUM OXIDE 400 (241.3 MG) MG PO TABS
200.0000 mg | ORAL_TABLET | Freq: Every day | ORAL | Status: DC
  Administered 2019-03-30: 200 mg via ORAL
  Filled 2019-03-30: qty 1

## 2019-03-30 NOTE — Progress Notes (Signed)
Courtney Bradford Kitchen  PROGRESS NOTE    Courtney Bradford  QIH:[id number] DOB: Feb 03, 1931 DOA: 03/25/2019 PCP: Iona Beard, MD   Brief Narrative:   Courtney Bradford an 85 y.o.femalewith a PMH of hypertension, gout, diabetes, stage III CKD, diastolic CHF, and atrial fibrillation on chronic anticoagulation with Eliquis who was admitted 03/24/2018 for evaluation of fever associated with vomiting, diarrhea, and acute encephalopathy. On admission, temperature was 101.6 Fahrenheit and urinalysis was suggestive of UTI. Cultures were sent and empiric antibiotics were initiated.   Assessment & Plan:   Principal Problem:   Acute encephalopathy Active Problems:   Essential hypertension   Chronic diastolic CHF (congestive heart failure) (HCC)   Atrial fibrillation, chronic (HCC)   Type 2 diabetes, controlled, with renal manifestation (HCC)   Lower urinary tract infectious disease   CKD (chronic kidney disease), stage III (HCC)   Hypomagnesemia   Hydroureteronephrosis   Elevated troponin   Demand ischemia (HCC)   Sinus bradycardia   First degree AV block   Aortic atherosclerosis (HCC)   Mild cardiomegaly   Bacteremia due to Gram-negative bacteria   Acute encephalopathy secondary to lower urinary tract infectious disease/UTI associated with mild hydroureteronephrosis and gas in the bladder per CT scan findings/ E. Coli and enterobacter bacteremia     - CT of the head negative for acute findings, showed atrophy.       - CT of the abdomen/pelvis showed right-sided hydroureteronephrosis.       - Urinalysis consistent with acute infection; positive for E. coli and Enterobacter.       - ID recs 5-7 days of IV antibiotics followed by another week of oral therapy.       - Rocephin 2 g IV daily.         - PT/OT evaluations obtained with 24-hour supervision and home health PT recommended.     - transition to keflex or augmentin; will speak with pharm  Essential hypertension     - Continue Norvasc and Lasix.   Atrial fibrillation, chronic (HCC)     - Continue Eliquis.  Diabetes mellitus type 2, controlled ([place]) associated with renal complications     - insulin sensitive SSI; monitor sugars  Normocytic anemia     - Continue iron supplementation.       - baseline hemoglobin of 8.7.       - Occult blood testing negative.  Chronic diastolic CHF/elevated troponin/sinus bradycardia/first-degree AV block/mild cardiomegaly/aortic atherosclerosis     - On telemetry     - Continue Lasix.     - Likely secondary to demand ischemia in the setting of acute infection.       - No evidence of pulmonary edema on chest x-ray.       - EKG showed marked sinus bradycardia at 45 bpm with first-degree AV block.     - stable today, denies any complaints   Stage III CKD     - Baseline creatinine appears to be 1.5- 2.0.   Hypomagnesemia     - add slow mag  Gout     - colchicine 0.6 qday     - this is a chronic medicine; family aware and accepting of renal risks.   DVT prophylaxis: eliquis Code Status: FULL   Disposition Plan: TBD   Antimicrobials:  . cefazolin    Subjective: "I'm fine."  Objective: Vitals:   03/29/19 1110 03/29/19 1819 03/29/19 1916 03/30/19 0619  BP: (!) 152/73  (!) 156/62 (!) 182/74  Pulse: 66  74  76  Resp: 18  18 18   Temp: 99.4 F (37.4 C) 100.3 F (37.9 C) 99.7 F (37.6 C) 98 F (36.7 C)  TempSrc: Oral Oral Oral Oral  SpO2: 100%  97% 96%  Weight:    65.4 kg  Height:        Intake/Output Summary (Last 24 hours) at 03/30/2019 0920 Last data filed at 03/30/2019 0600 Gross per 24 hour  Intake 842.6 ml  Output 1500 ml  Net -657.4 ml   Filed Weights   03/27/19 0647 03/29/19 0608 03/30/19 0619  Weight: 69.1 kg 67 kg 65.4 kg    Examination:  General exam: 85 y.o. female Appears calm and comfortable  Respiratory system: Clear to auscultation. Respiratory effort normal. Cardiovascular system: S1 & S2 heard, RRR. 1/6 SEM, No JVD, rubs, gallops or clicks. No pedal  edema. Gastrointestinal system: Abdomen is nondistended, soft and nontender. No organomegaly or masses felt. Normal bowel sounds heard. Central nervous system: Alert and oriented. No focal neurological deficits. Extremities: Symmetric 5 x 5 power.    Data Reviewed: I have personally reviewed following labs and imaging studies.  CBC: Recent Labs  Lab 03/25/19 1802 03/26/19 0340 03/28/19 0528 03/30/19 0529  WBC 7.9 16.7* 7.4 8.9  NEUTROABS 7.5  --   --  6.7  HGB 9.2* 8.5* 8.4* 8.9*  HCT 29.5* 27.0* 26.5* 28.2*  MCV 90.8 90.9 90.8 87.9  PLT 174 163 171 981   Basic Metabolic Panel: Recent Labs  Lab 03/25/19 1802 03/26/19 0340 03/28/19 0528 03/30/19 0529  NA 144 144 141 138  K 3.7 4.0 4.6 4.2  CL 110 109 109 105  CO2 22 23 22 23   GLUCOSE 82 90 79 116*  BUN 45* 39* 36* 24*  CREATININE 1.57* 1.44* 1.37* 1.26*  CALCIUM 9.4 9.1 9.5 9.3  MG  --  1.6*  --  1.5*  PHOS  --  3.0  --  2.0*   GFR: Estimated Creatinine Clearance: 30.6 mL/min (A) (by C-G formula based on SCr of 1.26 mg/dL (H)). Liver Function Tests: Recent Labs  Lab 03/25/19 1802 03/30/19 0529  AST 23  --   ALT 12  --   ALKPHOS 70  --   BILITOT 1.0  --   PROT 6.8  --   ALBUMIN 3.6 3.1*   Recent Labs  Lab 03/25/19 1822  LIPASE 24   No results for input(s): AMMONIA in the last 168 hours. Coagulation Profile: Recent Labs  Lab 03/25/19 1802  INR 1.5*   Cardiac Enzymes: Recent Labs  Lab 03/25/19 1822 03/26/19 0340  TROPONINI 0.06* 0.11*   BNP (last 3 results) No results for input(s): PROBNP in the last 8760 hours. HbA1C: No results for input(s): HGBA1C in the last 72 hours. CBG: Recent Labs  Lab 03/29/19 0641 03/29/19 1109 03/29/19 1605 03/29/19 2108 03/30/19 0616  GLUCAP 84 106* 123* 142* 123*   Lipid Profile: No results for input(s): CHOL, HDL, LDLCALC, TRIG, CHOLHDL, LDLDIRECT in the last 72 hours. Thyroid Function Tests: No results for input(s): TSH, T4TOTAL, FREET4, T3FREE,  THYROIDAB in the last 72 hours. Anemia Panel: No results for input(s): VITAMINB12, FOLATE, FERRITIN, TIBC, IRON, RETICCTPCT in the last 72 hours. Sepsis Labs: Recent Labs  Lab 03/25/19 1802 03/26/19 0340  LATICACIDVEN 2.5* 1.9    Recent Results (from the past 240 hour(s))  Culture, blood (Routine x 2)     Status: None (Preliminary result)   Collection Time: 03/25/19  6:03 PM  Result Value Ref Range Status  Specimen Description BLOOD RIGHT ANTECUBITAL  Final   Special Requests   Final    BOTTLES DRAWN AEROBIC AND ANAEROBIC Blood Culture results may not be optimal due to an inadequate volume of blood received in culture bottles   Culture   Final    NO GROWTH 4 DAYS Performed at [facility], [place] [address], [place], [place] [address]    Report Status PENDING  Incomplete  Culture, blood (Routine x 2)     Status: Abnormal   Collection Time: 03/25/19  6:32 PM  Result Value Ref Range Status   Specimen Description BLOOD RIGHT ANTECUBITAL  Final   Special Requests   Final    BOTTLES DRAWN AEROBIC AND ANAEROBIC Blood Culture adequate volume   Culture  Setup Time   Final    GRAM NEGATIVE RODS IN BOTH AEROBIC AND ANAEROBIC BOTTLES CRITICAL RESULT CALLED TO, READ BACK BY AND VERIFIED WITH: E. DEJA PHARMD, AT 1119 03/26/19 BY D. VANHOOK Performed at [facility], [place] [address], [place],  [address]    Culture ESCHERICHIA COLI (A)  Final   Report Status 03/28/2019 FINAL  Final   Organism ID, Bacteria ESCHERICHIA COLI  Final      Susceptibility   Escherichia coli - MIC*    AMPICILLIN <=2 SENSITIVE Sensitive     CEFAZOLIN <=4 SENSITIVE Sensitive     CEFEPIME <=1 SENSITIVE Sensitive     CEFTAZIDIME <=1 SENSITIVE Sensitive     CEFTRIAXONE <=1 SENSITIVE Sensitive     CIPROFLOXACIN <=0.25 SENSITIVE Sensitive     GENTAMICIN <=1 SENSITIVE Sensitive     IMIPENEM <=0.25 SENSITIVE Sensitive     TRIMETH/SULFA <=20 SENSITIVE Sensitive     AMPICILLIN/SULBACTAM <=2 SENSITIVE  Sensitive     PIP/TAZO <=4 SENSITIVE Sensitive     Extended ESBL NEGATIVE Sensitive     * ESCHERICHIA COLI  Blood Culture ID Panel (Reflexed)     Status: Abnormal   Collection Time: 03/25/19  6:32 PM  Result Value Ref Range Status   Enterococcus species NOT DETECTED NOT DETECTED Final   Listeria monocytogenes NOT DETECTED NOT DETECTED Final   Staphylococcus species NOT DETECTED NOT DETECTED Final   Staphylococcus aureus (BCID) NOT DETECTED NOT DETECTED Final   Streptococcus species NOT DETECTED NOT DETECTED Final   Streptococcus agalactiae NOT DETECTED NOT DETECTED Final   Streptococcus pneumoniae NOT DETECTED NOT DETECTED Final   Streptococcus pyogenes NOT DETECTED NOT DETECTED Final   Acinetobacter baumannii NOT DETECTED NOT DETECTED Final   Enterobacteriaceae species DETECTED (A) NOT DETECTED Final    Comment: Enterobacteriaceae represent a large family of gram-negative bacteria, not a single organism. CRITICAL RESULT CALLED TO, READ BACK BY AND VERIFIED WITH: E. DEJA PHARMD, AT 1119 03/26/19 BY D. VANHOOK    Enterobacter cloacae complex NOT DETECTED NOT DETECTED Final   Escherichia coli DETECTED (A) NOT DETECTED Final    Comment: CRITICAL RESULT CALLED TO, READ BACK BY AND VERIFIED WITH: E. DEJA PHARMD, AT 1119 03/26/19 BY D. VANHOOK    Klebsiella oxytoca NOT DETECTED NOT DETECTED Final   Klebsiella pneumoniae NOT DETECTED NOT DETECTED Final   Proteus species NOT DETECTED NOT DETECTED Final   Serratia marcescens NOT DETECTED NOT DETECTED Final   Carbapenem resistance NOT DETECTED NOT DETECTED Final   Haemophilus influenzae NOT DETECTED NOT DETECTED Final   Neisseria meningitidis NOT DETECTED NOT DETECTED Final   Pseudomonas aeruginosa NOT DETECTED NOT DETECTED Final   Candida albicans NOT DETECTED NOT DETECTED Final  Candida glabrata NOT DETECTED NOT DETECTED Final   Candida krusei NOT DETECTED NOT DETECTED Final   Candida parapsilosis NOT DETECTED NOT DETECTED Final    Candida tropicalis NOT DETECTED NOT DETECTED Final    Comment: Performed at [facility], [place] [address] [place], [place] [address]  SARS Coronavirus 2 (CEPHEID- Performed in [place] [facility] lab), Hosp Order     Status: None   Collection Time: 03/25/19  6:35 PM  Result Value Ref Range Status   SARS Coronavirus 2 NEGATIVE NEGATIVE Final    Comment: (NOTE) If result is NEGATIVE SARS-CoV-2 target nucleic acids are NOT DETECTED. The SARS-CoV-2 RNA is generally detectable in upper and lower  respiratory specimens during the acute phase of infection. The lowest  concentration of SARS-CoV-2 viral copies this assay can detect is 250  copies / mL. A negative result does not preclude SARS-CoV-2 infection  and should not be used as the sole basis for treatment or other  patient management decisions.  A negative result may occur with  improper specimen collection / handling, submission of specimen other  than nasopharyngeal swab, presence of viral mutation(s) within the  areas targeted by this assay, and inadequate number of viral copies  (<250 copies / mL). A negative result must be combined with clinical  observations, patient history, and epidemiological information. If result is POSITIVE SARS-CoV-2 target nucleic acids are DETECTED. The SARS-CoV-2 RNA is generally detectable in upper and lower  respiratory specimens dur ing the acute phase of infection.  Positive  results are indicative of active infection with SARS-CoV-2.  Clinical  correlation with patient history and other diagnostic information is  necessary to determine patient infection status.  Positive results do  not rule out bacterial infection or co-infection with other viruses. If result is PRESUMPTIVE POSTIVE SARS-CoV-2 nucleic acids MAY BE PRESENT.   A presumptive positive result was obtained on the submitted specimen  and confirmed on repeat testing.  While 2019 novel coronavirus  (SARS-CoV-2) nucleic acids may be  present in the submitted sample  additional confirmatory testing may be necessary for epidemiological  and / or clinical management purposes  to differentiate between  SARS-CoV-2 and other Sarbecovirus currently known to infect humans.  If clinically indicated additional testing with an alternate test  methodology [phone]) is advised. The SARS-CoV-2 RNA is generally  detectable in upper and lower respiratory sp ecimens during the acute  phase of infection. The expected result is Negative. Fact Sheet for Patients:  StrictlyIdeas.no Fact Sheet for Healthcare Providers: BankingDealers.co.za This test is not yet approved or cleared by the [place] FDA and has been authorized for detection and/or diagnosis of SARS-CoV-2 by FDA under an Emergency Use Authorization (EUA).  This EUA will remain in effect (meaning this test can be used) for the duration of the COVID-19 declaration under Section 564(b)(1) of the Act, 21 U.S.C. section 360bbb-3(b)(1), unless the authorization is terminated or revoked sooner. Performed at [facility], [place] [address] [place], [place] [address]   Urine culture     Status: Abnormal   Collection Time: 03/25/19  9:31 PM  Result Value Ref Range Status   Specimen Description URINE, CLEAN CATCH  Final   Special Requests   Final    NONE Performed at [facility], [place] [address], [place], [place] [address]    Culture (A)  Final    >=100,000 COLONIES/mL ESCHERICHIA COLI >=100,000 COLONIES/mL KLEBSIELLA PNEUMONIAE    Report Status 03/28/2019 FINAL  Final   Organism ID, Bacteria ESCHERICHIA COLI (A)  Final   Organism ID, Bacteria KLEBSIELLA PNEUMONIAE (A)  Final      Susceptibility   Escherichia coli - MIC*    AMPICILLIN 16 INTERMEDIATE Intermediate     CEFAZOLIN <=4 SENSITIVE Sensitive     CEFTRIAXONE <=1 SENSITIVE Sensitive     CIPROFLOXACIN >=4 RESISTANT Resistant     GENTAMICIN <=1 SENSITIVE  Sensitive     IMIPENEM <=0.25 SENSITIVE Sensitive     NITROFURANTOIN 32 SENSITIVE Sensitive     TRIMETH/SULFA <=20 SENSITIVE Sensitive     AMPICILLIN/SULBACTAM 8 SENSITIVE Sensitive     PIP/TAZO <=4 SENSITIVE Sensitive     Extended ESBL NEGATIVE Sensitive     * >=100,000 COLONIES/mL ESCHERICHIA COLI   Klebsiella pneumoniae - MIC*    AMPICILLIN >=32 RESISTANT Resistant     CEFAZOLIN <=4 SENSITIVE Sensitive     CEFTRIAXONE <=1 SENSITIVE Sensitive     CIPROFLOXACIN <=0.25 SENSITIVE Sensitive     GENTAMICIN <=1 SENSITIVE Sensitive     IMIPENEM <=0.25 SENSITIVE Sensitive     NITROFURANTOIN 64 INTERMEDIATE Intermediate     TRIMETH/SULFA <=20 SENSITIVE Sensitive     AMPICILLIN/SULBACTAM 4 SENSITIVE Sensitive     PIP/TAZO <=4 SENSITIVE Sensitive     Extended ESBL NEGATIVE Sensitive     * >=100,000 COLONIES/mL KLEBSIELLA PNEUMONIAE         Radiology Studies: No results found.      Scheduled Meds: . amLODipine  2.5 mg Oral Daily  . apixaban  2.5 mg Oral BID  . colchicine  0.6 mg Oral Daily  . Ferrous Fumarate  106 mg of iron Oral Q breakfast  . furosemide  20 mg Oral Q M,W,F  . insulin aspart  0-5 Units Subcutaneous QHS  . insulin aspart  0-9 Units Subcutaneous TID WC  . olopatadine  1 drop Both Eyes BID   Continuous Infusions: . sodium chloride Stopped (03/29/19 1257)  .  ceFAZolin (ANCEF) IV 2 g (03/29/19 2215)     LOS: 4 days    Time spent: 25 minutes spent in the coordination of care today.    Jonnie Finner, DO [facility] Pager [phone]  If 7PM-7AM, please contact night-coverage [email or web address] Password [email or web address] 03/30/2019, 9:20 AM

## 2019-03-30 NOTE — Progress Notes (Signed)
Patient temp is 100.2 F and BP is 172/84 this evening.  Paged MD  MD is aware

## 2019-03-30 NOTE — Progress Notes (Signed)
Held evening insulin even though sugar was 123.  Patient has not been eating well and other sugars has been running low

## 2019-03-30 NOTE — Plan of Care (Signed)
  Problem: Activity: Goal: Capacity to carry out activities will improve Outcome: Progressing   Problem: Health Behavior/Discharge Planning: Goal: Ability to manage health-related needs will improve Outcome: Progressing   Problem: Clinical Measurements: Goal: Ability to maintain clinical measurements within normal limits will improve Outcome: Progressing   

## 2019-03-31 LAB — MAGNESIUM: Magnesium: 1.4 mg/dL — ABNORMAL LOW (ref 1.7–2.4)

## 2019-03-31 LAB — RENAL FUNCTION PANEL
Albumin: 2.9 g/dL — ABNORMAL LOW (ref 3.5–5.0)
Anion gap: 11 (ref 5–15)
BUN: 18 mg/dL (ref 8–23)
CO2: 22 mmol/L (ref 22–32)
Calcium: 9.2 mg/dL (ref 8.9–10.3)
Chloride: 102 mmol/L (ref 98–111)
Creatinine, Ser: 1.23 mg/dL — ABNORMAL HIGH (ref 0.44–1.00)
GFR calc Af Amer: 46 mL/min — ABNORMAL LOW
GFR calc non Af Amer: 40 mL/min — ABNORMAL LOW
Glucose, Bld: 122 mg/dL — ABNORMAL HIGH (ref 70–99)
Phosphorus: 2.2 mg/dL — ABNORMAL LOW (ref 2.5–4.6)
Potassium: 4 mmol/L (ref 3.5–5.1)
Sodium: 135 mmol/L (ref 135–145)

## 2019-03-31 LAB — CBC WITH DIFFERENTIAL/PLATELET
Abs Immature Granulocytes: 0.19 10*3/uL — ABNORMAL HIGH (ref 0.00–0.07)
Basophils Absolute: 0.1 10*3/uL (ref 0.0–0.1)
Basophils Relative: 0 %
Eosinophils Absolute: 0 10*3/uL (ref 0.0–0.5)
Eosinophils Relative: 0 %
HCT: 28.8 % — ABNORMAL LOW (ref 36.0–46.0)
Hemoglobin: 9.3 g/dL — ABNORMAL LOW (ref 12.0–15.0)
Immature Granulocytes: 2 %
Lymphocytes Relative: 9 %
Lymphs Abs: 1.1 10*3/uL (ref 0.7–4.0)
MCH: 28.2 pg (ref 26.0–34.0)
MCHC: 32.3 g/dL (ref 30.0–36.0)
MCV: 87.3 fL (ref 80.0–100.0)
Monocytes Absolute: 1.3 10*3/uL — ABNORMAL HIGH (ref 0.1–1.0)
Monocytes Relative: 10 %
Neutro Abs: 10.2 10*3/uL — ABNORMAL HIGH (ref 1.7–7.7)
Neutrophils Relative %: 79 %
Platelets: 228 10*3/uL (ref 150–400)
RBC: 3.3 MIL/uL — ABNORMAL LOW (ref 3.87–5.11)
RDW: 14.2 % (ref 11.5–15.5)
WBC: 12.8 10*3/uL — ABNORMAL HIGH (ref 4.0–10.5)
nRBC: 0 % (ref 0.0–0.2)

## 2019-03-31 LAB — GLUCOSE, CAPILLARY
Glucose-Capillary: 127 mg/dL — ABNORMAL HIGH (ref 70–99)
Glucose-Capillary: 145 mg/dL — ABNORMAL HIGH (ref 70–99)
Glucose-Capillary: 160 mg/dL — ABNORMAL HIGH (ref 70–99)
Glucose-Capillary: 150 mg/dL — ABNORMAL HIGH (ref 70–99)

## 2019-03-31 MED ORDER — AMOXICILLIN-POT CLAVULANATE 500-125 MG PO TABS
1.0000 | ORAL_TABLET | Freq: Two times a day (BID) | ORAL | Status: DC
  Administered 2019-03-31 – 2019-04-04 (×9): 500 mg via ORAL
  Filled 2019-03-31 (×11): qty 1

## 2019-03-31 MED ORDER — SENNOSIDES-DOCUSATE SODIUM 8.6-50 MG PO TABS
2.0000 | ORAL_TABLET | Freq: Every evening | ORAL | Status: DC | PRN
  Administered 2019-03-31 – 2019-04-06 (×3): 2 via ORAL
  Filled 2019-03-31 (×3): qty 2

## 2019-03-31 MED ORDER — PREDNISONE 10 MG PO TABS
10.0000 mg | ORAL_TABLET | Freq: Every day | ORAL | Status: AC
  Administered 2019-03-31 – 2019-04-02 (×3): 10 mg via ORAL
  Filled 2019-03-31 (×3): qty 1

## 2019-03-31 MED ORDER — AMOXICILLIN-POT CLAVULANATE 875-125 MG PO TABS: 1.0000 | ORAL_TABLET | Freq: Two times a day (BID) | ORAL | Status: DC

## 2019-03-31 MED ORDER — MAGNESIUM OXIDE 400 (241.3 MG) MG PO TABS
400.0000 mg | ORAL_TABLET | Freq: Every day | ORAL | Status: AC
  Administered 2019-03-31 – 2019-04-01 (×2): 400 mg via ORAL
  Filled 2019-03-31 (×2): qty 1

## 2019-03-31 NOTE — Plan of Care (Signed)
  Problem: Activity: Goal: Capacity to carry out activities will improve Outcome: Progressing   Problem: Clinical Measurements: Goal: Ability to maintain clinical measurements within normal limits will improve Outcome: Progressing   Problem: Pain Managment: Goal: General experience of comfort will improve Outcome: Progressing   Problem: Safety: Goal: Ability to remain free from injury will improve Outcome: Progressing

## 2019-03-31 NOTE — Progress Notes (Signed)
PT Cancellation Note  Patient Details Name: [name] MRN: [id number] DOB: 21-Oct-1931   Cancelled Treatment:    Reason Eval/Treat Not Completed: Other (comment)(son Ron present in room feeding pt. Reports RUE edema with gout and pain and deferred attempts at therapy until next date)   Sandy Salaam Tenia Goh 03/31/2019, 8:05 AM  [place] Pager: [phone] Office: [phone]

## 2019-03-31 NOTE — TOC Transition Note (Signed)
Transition of Care [facility]) - CM/SW Discharge Note   Patient Details  Name: SHEBA WHALING MRN: [id number] Date of Birth: 1931-01-06  [facility] CM/SW Contact:  Royston Bake, RN Phone Number: 03/31/2019, 8:19 AM   Clinical Narrative:    Patient is to return home at discharge with [facility] services provided by [facility]; Son primary caregiver along with Personal care service.   Final next level of care: Home/Self Care Barriers to Discharge: No Barriers Identified   Patient Goals and CMS Choice Patient states their goals for this hospitalization and ongoing recovery are:: to go home CMS Medicare.gov Compare Post Acute Care list provided to:: Patient Represenative (must comment)(son Ron) Choice offered to / list presented to : Adult Children  Discharge Placement                       Discharge Plan and Services In-house Referral: NA Discharge Planning Services: CM Consult Post Acute Care Choice: NA          DME Arranged: N/A DME Agency: NA                  Social Determinants of Health (SDOH) Interventions     Readmission Risk Interventions No flowsheet data found.

## 2019-03-31 NOTE — Progress Notes (Signed)
Marland Kitchen  PROGRESS NOTE    Courtney Bradford  TMH:[id number] DOB: [birth year] DOA: 03/25/2019 PCP: Iona Beard, MD   Brief Narrative:   Courtney Bradford an 85 y.o.femalewith a PMH of hypertension, gout, diabetes, stage III CKD, diastolic CHF, and atrial fibrillation on chronic anticoagulation with Eliquis who was admitted 03/24/2018 for evaluation of fever associated with vomiting, diarrhea, and acute encephalopathy. On admission, temperature was 101.6 Fahrenheit and urinalysis was suggestive of UTI. Cultures were sent and empiric antibiotics were initiated.   Assessment & Plan:   Principal Problem:   Acute encephalopathy Active Problems:   Essential hypertension   Chronic diastolic CHF (congestive heart failure) (HCC)   Atrial fibrillation, chronic (HCC)   Type 2 diabetes, controlled, with renal manifestation (HCC)   Lower urinary tract infectious disease   CKD (chronic kidney disease), stage III (HCC)   Hypomagnesemia   Hydroureteronephrosis   Elevated troponin   Demand ischemia (HCC)   Sinus bradycardia   First degree AV block   Aortic atherosclerosis (HCC)   Mild cardiomegaly   Bacteremia due to Gram-negative bacteria   Acute encephalopathy secondary to lower urinary tract infectious disease/UTI associated with mild hydroureteronephrosis and gas in the bladder per CT scan findings/ E. Coli and enterobacter bacteremia - CT of the head negative for acute findings, showed atrophy.  - CT of the abdomen/pelvis showed right-sided hydroureteronephrosis.  - Urinalysis consistent with acute infection; positive for E. coli and Enterobacter.  - ID recs 5-7 days of IV antibiotics followed by another week of oral therapy.  - Rocephin 2 g IV daily.  - PT/OT evaluations obtained with 24-hour supervision and home health PT recommended.     - transition to augmentin  Essential hypertension - Continue Norvasc and Lasix.  Atrial fibrillation, chronic (HCC)  - Continue Eliquis.  Diabetes mellitus type 2, controlled ([facility]) associated with renal complications - insulin sensitive SSI; monitor sugars  Normocytic anemia - Continue iron supplementation.  - baseline hemoglobin of 8.7.  - Occult blood testing negative.  Chronic diastolic CHF/elevated troponin/sinus bradycardia/first-degree AV block/mild cardiomegaly/aortic atherosclerosis - On telemetry - Continue Lasix. - Likely secondary to demand ischemia in the setting of acute infection.  - No evidence of pulmonary edema on chest x-ray.  - EKG showed marked sinus bradycardia at 45 bpm with first-degree AV block. - stable today, denies any complaints  Stage III CKD - Baseline creatinine appears to be 1.5- 2.0.   Hypomagnesemia - add slow mag  Gout     - colchicine 0.6 qday     - this is a chronic medicine; family aware and accepting of renal risks.     - minor fever ON 100.8 likely d/t this flare      - prednisone 10mg  x 3 days; would not increase beyond this d/t bacteremia  DVT prophylaxis:eliquis Code Status:FULL Disposition Plan:TBD   Antimicrobials:  . Augmentin 875mg  BID    Subjective: "I guess I'm fine."  Objective: Vitals:   03/30/19 2055 03/31/19 0407 03/31/19 1024 03/31/19 1130  BP: (!) 162/61 (!) 153/82 (!) 145/69 132/86  Pulse: 73 72 90 88  Resp:  18 18 18   Temp: 99.9 F (37.7 C) 99.8 F (37.7 C) 99.7 F (37.6 C) 99.1 F (37.3 C)  TempSrc: Oral Oral Oral Oral  SpO2:  100% 100% 99%  Weight:  66.3 kg    Height:        Intake/Output Summary (Last 24 hours) at 03/31/2019 1308 Last data filed at 03/31/2019 1132 Gross per  24 hour  Intake 1056.9 ml  Output 1350 ml  Net -293.1 ml   Filed Weights   03/29/19 0608 03/30/19 0619 03/31/19 0407  Weight: 67 kg 65.4 kg 66.3 kg    Examination:  General exam:85 y.o.femaleAppears calm and comfortable  Respiratory system: Clear to auscultation.  Respiratory effort normal. Cardiovascular system:S1 &S2 heard, RRR. 1/6 SEM,No JVD, rubs, gallops or clicks. No pedal edema. Gastrointestinal system:Abdomen is nondistended, soft and nontender. No organomegaly or masses felt. Normal bowel sounds heard. Central nervous system:Alert and oriented. No focal neurological deficits. Extremities: Symmetric 5 x 5 power.; right wrist swelling with warmth    Data Reviewed: I have personally reviewed following labs and imaging studies.  CBC: Recent Labs  Lab 03/25/19 1802 03/26/19 0340 03/28/19 0528 03/30/19 0529 03/31/19 0619  WBC 7.9 16.7* 7.4 8.9 12.8*  NEUTROABS 7.5  --   --  6.7 10.2*  HGB 9.2* 8.5* 8.4* 8.9* 9.3*  HCT 29.5* 27.0* 26.5* 28.2* 28.8*  MCV 90.8 90.9 90.8 87.9 87.3  PLT 174 163 171 208 884   Basic Metabolic Panel: Recent Labs  Lab 03/25/19 1802 03/26/19 0340 03/28/19 0528 03/30/19 0529 03/31/19 0619  NA 144 144 141 138 135  K 3.7 4.0 4.6 4.2 4.0  CL 110 109 109 105 102  CO2 22 23 22 23 22   GLUCOSE 82 90 79 116* 122*  BUN 45* 39* 36* 24* 18  CREATININE 1.57* 1.44* 1.37* 1.26* 1.23*  CALCIUM 9.4 9.1 9.5 9.3 9.2  MG  --  1.6*  --  1.5* 1.4*  PHOS  --  3.0  --  2.0* 2.2*   GFR: Estimated Creatinine Clearance: 31.3 mL/min (A) (by C-G formula based on SCr of 1.23 mg/dL (H)). Liver Function Tests: Recent Labs  Lab 03/25/19 1802 03/30/19 0529 03/31/19 0619  AST 23  --   --   ALT 12  --   --   ALKPHOS 70  --   --   BILITOT 1.0  --   --   PROT 6.8  --   --   ALBUMIN 3.6 3.1* 2.9*   Recent Labs  Lab 03/25/19 1822  LIPASE 24   No results for input(s): AMMONIA in the last 168 hours. Coagulation Profile: Recent Labs  Lab 03/25/19 1802  INR 1.5*   Cardiac Enzymes: Recent Labs  Lab 03/25/19 1822 03/26/19 0340  TROPONINI 0.06* 0.11*   BNP (last 3 results) No results for input(s): PROBNP in the last 8760 hours. HbA1C: No results for input(s): HGBA1C in the last 72 hours. CBG: Recent Labs   Lab 03/30/19 1157 03/30/19 1638 03/30/19 2120 03/31/19 0622 03/31/19 1127  GLUCAP 161* 119* 207* 127* 145*   Lipid Profile: No results for input(s): CHOL, HDL, LDLCALC, TRIG, CHOLHDL, LDLDIRECT in the last 72 hours. Thyroid Function Tests: No results for input(s): TSH, T4TOTAL, FREET4, T3FREE, THYROIDAB in the last 72 hours. Anemia Panel: No results for input(s): VITAMINB12, FOLATE, FERRITIN, TIBC, IRON, RETICCTPCT in the last 72 hours. Sepsis Labs: Recent Labs  Lab 03/25/19 1802 03/26/19 0340  LATICACIDVEN 2.5* 1.9    Recent Results (from the past 240 hour(s))  Culture, blood (Routine x 2)     Status: None   Collection Time: 03/25/19  6:03 PM  Result Value Ref Range Status   Specimen Description BLOOD RIGHT ANTECUBITAL  Final   Special Requests   Final    BOTTLES DRAWN AEROBIC AND ANAEROBIC Blood Culture results may not be optimal due to an inadequate volume  of blood received in culture bottles   Culture   Final    NO GROWTH 5 DAYS Performed at [facility], [place] [address], [place], [place] [address]    Report Status 03/30/2019 FINAL  Final  Culture, blood (Routine x 2)     Status: Abnormal   Collection Time: 03/25/19  6:32 PM  Result Value Ref Range Status   Specimen Description BLOOD RIGHT ANTECUBITAL  Final   Special Requests   Final    BOTTLES DRAWN AEROBIC AND ANAEROBIC Blood Culture adequate volume   Culture  Setup Time   Final    GRAM NEGATIVE RODS IN BOTH AEROBIC AND ANAEROBIC BOTTLES CRITICAL RESULT CALLED TO, READ BACK BY AND VERIFIED WITH: E. DEJA PHARMD, AT 1119 03/26/19 BY D. VANHOOK Performed at [facility], [place] [address] [place], [place] [address]    Culture ESCHERICHIA COLI (A)  Final   Report Status 03/28/2019 FINAL  Final   Organism ID, Bacteria ESCHERICHIA COLI  Final      Susceptibility   Escherichia coli - MIC*    AMPICILLIN <=2 SENSITIVE Sensitive     CEFAZOLIN <=4 SENSITIVE Sensitive     CEFEPIME <=1 SENSITIVE Sensitive      CEFTAZIDIME <=1 SENSITIVE Sensitive     CEFTRIAXONE <=1 SENSITIVE Sensitive     CIPROFLOXACIN <=0.25 SENSITIVE Sensitive     GENTAMICIN <=1 SENSITIVE Sensitive     IMIPENEM <=0.25 SENSITIVE Sensitive     TRIMETH/SULFA <=20 SENSITIVE Sensitive     AMPICILLIN/SULBACTAM <=2 SENSITIVE Sensitive     PIP/TAZO <=4 SENSITIVE Sensitive     Extended ESBL NEGATIVE Sensitive     * ESCHERICHIA COLI  Blood Culture ID Panel (Reflexed)     Status: Abnormal   Collection Time: 03/25/19  6:32 PM  Result Value Ref Range Status   Enterococcus species NOT DETECTED NOT DETECTED Final   Listeria monocytogenes NOT DETECTED NOT DETECTED Final   Staphylococcus species NOT DETECTED NOT DETECTED Final   Staphylococcus aureus (BCID) NOT DETECTED NOT DETECTED Final   Streptococcus species NOT DETECTED NOT DETECTED Final   Streptococcus agalactiae NOT DETECTED NOT DETECTED Final   Streptococcus pneumoniae NOT DETECTED NOT DETECTED Final   Streptococcus pyogenes NOT DETECTED NOT DETECTED Final   Acinetobacter baumannii NOT DETECTED NOT DETECTED Final   Enterobacteriaceae species DETECTED (A) NOT DETECTED Final    Comment: Enterobacteriaceae represent a large family of gram-negative bacteria, not a single organism. CRITICAL RESULT CALLED TO, READ BACK BY AND VERIFIED WITH: E. DEJA PHARMD, AT 1119 03/26/19 BY D. VANHOOK    Enterobacter cloacae complex NOT DETECTED NOT DETECTED Final   Escherichia coli DETECTED (A) NOT DETECTED Final    Comment: CRITICAL RESULT CALLED TO, READ BACK BY AND VERIFIED WITH: E. DEJA PHARMD, AT 1119 03/26/19 BY D. VANHOOK    Klebsiella oxytoca NOT DETECTED NOT DETECTED Final   Klebsiella pneumoniae NOT DETECTED NOT DETECTED Final   Proteus species NOT DETECTED NOT DETECTED Final   Serratia marcescens NOT DETECTED NOT DETECTED Final   Carbapenem resistance NOT DETECTED NOT DETECTED Final   Haemophilus influenzae NOT DETECTED NOT DETECTED Final   Neisseria meningitidis NOT DETECTED NOT  DETECTED Final   Pseudomonas aeruginosa NOT DETECTED NOT DETECTED Final   Candida albicans NOT DETECTED NOT DETECTED Final   Candida glabrata NOT DETECTED NOT DETECTED Final   Candida krusei NOT DETECTED NOT DETECTED Final   Candida parapsilosis NOT DETECTED NOT DETECTED Final   Candida tropicalis NOT DETECTED  NOT DETECTED Final    Comment: Performed at [facility], [place] [address], [place], [place] [address]  SARS Coronavirus 2 (CEPHEID- Performed in [place] hospital lab), Hosp Order     Status: None   Collection Time: 03/25/19  6:35 PM  Result Value Ref Range Status   SARS Coronavirus 2 NEGATIVE NEGATIVE Final    Comment: (NOTE) If result is NEGATIVE SARS-CoV-2 target nucleic acids are NOT DETECTED. The SARS-CoV-2 RNA is generally detectable in upper and lower  respiratory specimens during the acute phase of infection. The lowest  concentration of SARS-CoV-2 viral copies this assay can detect is 250  copies / mL. A negative result does not preclude SARS-CoV-2 infection  and should not be used as the sole basis for treatment or other  patient management decisions.  A negative result may occur with  improper specimen collection / handling, submission of specimen other  than nasopharyngeal swab, presence of viral mutation(s) within the  areas targeted by this assay, and inadequate number of viral copies  (<250 copies / mL). A negative result must be combined with clinical  observations, patient history, and epidemiological information. If result is POSITIVE SARS-CoV-2 target nucleic acids are DETECTED. The SARS-CoV-2 RNA is generally detectable in upper and lower  respiratory specimens dur ing the acute phase of infection.  Positive  results are indicative of active infection with SARS-CoV-2.  Clinical  correlation with patient history and other diagnostic information is  necessary to determine patient infection status.  Positive results do  not rule out bacterial infection  or co-infection with other viruses. If result is PRESUMPTIVE POSTIVE SARS-CoV-2 nucleic acids MAY BE PRESENT.   A presumptive positive result was obtained on the submitted specimen  and confirmed on repeat testing.  While 2019 novel coronavirus  (SARS-CoV-2) nucleic acids may be present in the submitted sample  additional confirmatory testing may be necessary for epidemiological  and / or clinical management purposes  to differentiate between  SARS-CoV-2 and other Sarbecovirus currently known to infect humans.  If clinically indicated additional testing with an alternate test  methodology [id number]) is advised. The SARS-CoV-2 RNA is generally  detectable in upper and lower respiratory sp ecimens during the acute  phase of infection. The expected result is Negative. Fact Sheet for Patients:  StrictlyIdeas.no Fact Sheet for Healthcare Providers: BankingDealers.co.za This test is not yet approved or cleared by the [place] FDA and has been authorized for detection and/or diagnosis of SARS-CoV-2 by FDA under an Emergency Use Authorization (EUA).  This EUA will remain in effect (meaning this test can be used) for the duration of the COVID-19 declaration under Section 564(b)(1) of the Act, 21 U.S.C. section 360bbb-3(b)(1), unless the authorization is terminated or revoked sooner. Performed at [facility], [place] [address] [place], [place] [address]   Urine culture     Status: Abnormal   Collection Time: 03/25/19  9:31 PM  Result Value Ref Range Status   Specimen Description URINE, CLEAN CATCH  Final   Special Requests   Final    NONE Performed at [facility], [place] [address], [place], [place] [address]    Culture (A)  Final    >=100,000 COLONIES/mL ESCHERICHIA COLI >=100,000 COLONIES/mL KLEBSIELLA PNEUMONIAE    Report Status 03/28/2019 FINAL  Final   Organism ID, Bacteria ESCHERICHIA COLI (A)  Final   Organism ID,  Bacteria KLEBSIELLA PNEUMONIAE (A)  Final      Susceptibility   Escherichia coli -  MIC*    AMPICILLIN 16 INTERMEDIATE Intermediate     CEFAZOLIN <=4 SENSITIVE Sensitive     CEFTRIAXONE <=1 SENSITIVE Sensitive     CIPROFLOXACIN >=4 RESISTANT Resistant     GENTAMICIN <=1 SENSITIVE Sensitive     IMIPENEM <=0.25 SENSITIVE Sensitive     NITROFURANTOIN 32 SENSITIVE Sensitive     TRIMETH/SULFA <=20 SENSITIVE Sensitive     AMPICILLIN/SULBACTAM 8 SENSITIVE Sensitive     PIP/TAZO <=4 SENSITIVE Sensitive     Extended ESBL NEGATIVE Sensitive     * >=100,000 COLONIES/mL ESCHERICHIA COLI   Klebsiella pneumoniae - MIC*    AMPICILLIN >=32 RESISTANT Resistant     CEFAZOLIN <=4 SENSITIVE Sensitive     CEFTRIAXONE <=1 SENSITIVE Sensitive     CIPROFLOXACIN <=0.25 SENSITIVE Sensitive     GENTAMICIN <=1 SENSITIVE Sensitive     IMIPENEM <=0.25 SENSITIVE Sensitive     NITROFURANTOIN 64 INTERMEDIATE Intermediate     TRIMETH/SULFA <=20 SENSITIVE Sensitive     AMPICILLIN/SULBACTAM 4 SENSITIVE Sensitive     PIP/TAZO <=4 SENSITIVE Sensitive     Extended ESBL NEGATIVE Sensitive     * >=100,000 COLONIES/mL KLEBSIELLA PNEUMONIAE      Radiology Studies: No results found.    Scheduled Meds: . amLODipine  2.5 mg Oral Daily  . amoxicillin-clavulanate  1 tablet Oral BID  . apixaban  2.5 mg Oral BID  . colchicine  0.6 mg Oral Daily  . Ferrous Fumarate  106 mg of iron Oral Q breakfast  . furosemide  20 mg Oral Q M,W,F  . insulin aspart  0-5 Units Subcutaneous QHS  . insulin aspart  0-9 Units Subcutaneous TID WC  . magnesium oxide  400 mg Oral Daily  . olopatadine  1 drop Both Eyes BID  . predniSONE  10 mg Oral Q breakfast   Continuous Infusions: . sodium chloride 250 mL (03/30/19 2059)     LOS: 5 days    Time spent: 25 minutes spent in the coordination of care today.     Jonnie Finner, DO [facility] Pager [phone]  If 7PM-7AM, please contact night-coverage [email or web address]  Password [email or web address] 03/31/2019, 1:08 PM

## 2019-03-31 NOTE — Progress Notes (Signed)
Patients right arm still swollen, warm.

## 2019-04-01 LAB — CBC WITH DIFFERENTIAL/PLATELET
Abs Immature Granulocytes: 0.33 10*3/uL — ABNORMAL HIGH (ref 0.00–0.07)
Basophils Absolute: 0.1 10*3/uL (ref 0.0–0.1)
Basophils Relative: 0 %
Eosinophils Absolute: 0.1 10*3/uL (ref 0.0–0.5)
Eosinophils Relative: 1 %
HCT: 27.2 % — ABNORMAL LOW (ref 36.0–46.0)
Hemoglobin: 8.7 g/dL — ABNORMAL LOW (ref 12.0–15.0)
Immature Granulocytes: 3 %
Lymphocytes Relative: 8 %
Lymphs Abs: 1.1 10*3/uL (ref 0.7–4.0)
MCH: 27.9 pg (ref 26.0–34.0)
MCHC: 32 g/dL (ref 30.0–36.0)
MCV: 87.2 fL (ref 80.0–100.0)
Monocytes Absolute: 1.3 10*3/uL — ABNORMAL HIGH (ref 0.1–1.0)
Monocytes Relative: 10 %
Neutro Abs: 10.5 10*3/uL — ABNORMAL HIGH (ref 1.7–7.7)
Neutrophils Relative %: 78 %
Platelets: 269 10*3/uL (ref 150–400)
RBC: 3.12 MIL/uL — ABNORMAL LOW (ref 3.87–5.11)
RDW: 14.2 % (ref 11.5–15.5)
WBC: 13.3 10*3/uL — ABNORMAL HIGH (ref 4.0–10.5)
nRBC: 0 % (ref 0.0–0.2)

## 2019-04-01 LAB — RENAL FUNCTION PANEL
Albumin: 2.6 g/dL — ABNORMAL LOW (ref 3.5–5.0)
Anion gap: 11 (ref 5–15)
BUN: 24 mg/dL — ABNORMAL HIGH (ref 8–23)
CO2: 25 mmol/L (ref 22–32)
Calcium: 9.3 mg/dL (ref 8.9–10.3)
Chloride: 99 mmol/L (ref 98–111)
Creatinine, Ser: 1.42 mg/dL — ABNORMAL HIGH (ref 0.44–1.00)
GFR calc Af Amer: 39 mL/min — ABNORMAL LOW (ref >60)
GFR calc non Af Amer: 34 mL/min — ABNORMAL LOW (ref >60)
Glucose, Bld: 117 mg/dL — ABNORMAL HIGH (ref 70–99)
Phosphorus: 2.2 mg/dL — ABNORMAL LOW (ref 2.5–4.6)
Potassium: 4.3 mmol/L (ref 3.5–5.1)
Sodium: 135 mmol/L (ref 135–145)

## 2019-04-01 LAB — GLUCOSE, CAPILLARY
Glucose-Capillary: 109 mg/dL — ABNORMAL HIGH (ref 70–99)
Glucose-Capillary: 160 mg/dL — ABNORMAL HIGH (ref 70–99)
Glucose-Capillary: 137 mg/dL — ABNORMAL HIGH (ref 70–99)
Glucose-Capillary: 172 mg/dL — ABNORMAL HIGH (ref 70–99)

## 2019-04-01 LAB — MAGNESIUM: Magnesium: 1.6 mg/dL — ABNORMAL LOW (ref 1.7–2.4)

## 2019-04-01 MED ORDER — COLCHICINE 0.6 MG PO TABS
0.3000 mg | ORAL_TABLET | Freq: Every day | ORAL | Status: DC
  Administered 2019-04-02 – 2019-04-07 (×6): 0.3 mg via ORAL
  Filled 2019-04-01 (×6): qty 0.5

## 2019-04-01 NOTE — Progress Notes (Signed)
Rehab Admissions Coordinator Note:  Per family request, this patient was screened by Jhonnie Garner for appropriateness for an Inpatient Acute Rehab Consult.  At this time, [facility] agrees with current PT recommendations for [place]. It is unlikely this patient meets medical necessity for an IP rehab stay as her decline in function appears to be precipitated by onset of gout.   Jhonnie Garner 04/01/2019, 5:04 PM  I can be reached at [phone].

## 2019-04-01 NOTE — Plan of Care (Signed)
  Problem: Activity: Goal: Capacity to carry out activities will improve Outcome: Progressing   Problem: Activity: Goal: Risk for activity intolerance will decrease Outcome: Progressing   Problem: Nutrition: Goal: Adequate nutrition will be maintained Outcome: Progressing   Problem: Pain Managment: Goal: General experience of comfort will improve Outcome: Progressing   Problem: Skin Integrity: Goal: Risk for impaired skin integrity will decrease Outcome: Progressing

## 2019-04-01 NOTE — Progress Notes (Signed)
Marland Kitchen  PROGRESS NOTE    Courtney Bradford  EGB:[id number] DOB: 04/11/1931 DOA: 03/25/2019 PCP: Iona Beard, MD   Brief Narrative:   Courtney Rolon Flackis an 85 y.o.femalewith a PMH of hypertension, gout, diabetes, stage III CKD, diastolic CHF, and atrial fibrillation on chronic anticoagulation with Eliquis who was admitted 03/24/2018 for evaluation of fever associated with vomiting, diarrhea, and acute encephalopathy. On admission, temperature was 101.6 Fahrenheit and urinalysis was suggestive of UTI. Cultures were sent and empiric antibiotics were initiated.   Assessment & Plan:   Principal Problem:   Acute encephalopathy Active Problems:   Essential hypertension   Chronic diastolic CHF (congestive heart failure) (HCC)   Atrial fibrillation, chronic (HCC)   Type 2 diabetes, controlled, with renal manifestation (HCC)   Lower urinary tract infectious disease   CKD (chronic kidney disease), stage III (HCC)   Hypomagnesemia   Hydroureteronephrosis   Elevated troponin   Demand ischemia (HCC)   Sinus bradycardia   First degree AV block   Aortic atherosclerosis (HCC)   Mild cardiomegaly   Bacteremia due to Gram-negative bacteria   Acute encephalopathy secondary to lower urinary tract infectious disease/UTI associated with mild hydroureteronephrosis and gas in the bladder per CT scan findings/ E. Coli and enterobacter bacteremia - CT of the head negative for acute findings, showed atrophy.  - CT of the abdomen/pelvis showed right-sided hydroureteronephrosis.  - Urinalysis consistent with acute infection; positive for E. coli and Enterobacter.  - ID recs 5-7 days of IV antibiotics followed by another week of oral therapy.  - Rocephin 2 g IV daily.  - PT/OT evaluations obtained with 24-hour supervision and home health PT recommended. - augmentin 500 BID through 04/08/2019  Essential hypertension - Continue Norvasc and Lasix.  Atrial fibrillation,  chronic (HCC) - Continue Eliquis.  Diabetes mellitus type 2, controlled ([place]) associated with renal complications - insulin sensitive SSI; monitor sugars  Normocytic anemia - Continue iron supplementation.  - baseline hemoglobin of 8.7.  - Occult blood testing negative.  Chronic diastolic CHF/elevated troponin/sinus bradycardia/first-degree AV block/mild cardiomegaly/aortic atherosclerosis - On telemetry - Continue Lasix. - Likely secondary to demand ischemia in the setting of acute infection.  - No evidence of pulmonary edema on chest x-ray.  - EKG showed marked sinus bradycardia at 45 bpm with first-degree AV block. - stable today, denies any complaints  Stage III CKD - Baseline creatinine appears to be 1.5-2.0.   Hypomagnesemia -slow mag 400mg  qday; Mg2+ improving  Gout - colchicine 0.6 qday - this is a chronic medicine; family aware and accepting of renal risks.     - no fevers ON     - prednisone 10mg  x 3 days (through 04/02/19); would not increase beyond this d/t bacteremia   DVT prophylaxis:eliquis Code Status:FULL Disposition Plan:TBD   Antimicrobials:  . Augmentin 500mg  BID    Subjective: "No. I don't need anything."  Objective: Vitals:   03/31/19 1024 03/31/19 1130 03/31/19 1910 04/01/19 0423  BP: (!) 145/69 132/86 (!) 162/74 (!) 159/82  Pulse: 90 88 68 99  Resp: 18 18 18 18   Temp: 99.7 F (37.6 C) 99.1 F (37.3 C) 98.4 F (36.9 C) 99.2 F (37.3 C)  TempSrc: Oral Oral Oral Oral  SpO2: 100% 99% 97% 100%  Weight:    67.2 kg  Height:        Intake/Output Summary (Last 24 hours) at 04/01/2019 0730 Last data filed at 03/31/2019 2200 Gross per 24 hour  Intake 870 ml  Output 325 ml  Net 545 ml   Filed Weights   03/30/19 0619 03/31/19 0407 04/01/19 0423  Weight: 65.4 kg 66.3 kg 67.2 kg    Examination:  General exam:85 y.o.femaleAppears calm and comfortable, resting in  bed and denies complaints  Respiratory system: Clear to auscultation. Respiratory effort normal. Cardiovascular system:S1 &S2 heard, RRR. 1/6 SEM,No JVD, rubs, gallops or clicks. No pedal edema. Gastrointestinal system:Abdomen is nondistended, soft and nontender. No organomegaly or masses felt. Normal bowel sounds heard. Central nervous system:Alert and oriented. No focal neurological deficits. Extremities: Symmetric 5 x 5 power.; right wrist swelling with warm stable    Data Reviewed: I have personally reviewed following labs and imaging studies.  CBC: Recent Labs  Lab 03/25/19 1802 03/26/19 0340 03/28/19 0528 03/30/19 0529 03/31/19 0619 04/01/19 0529  WBC 7.9 16.7* 7.4 8.9 12.8* 13.3*  NEUTROABS 7.5  --   --  6.7 10.2* 10.5*  HGB 9.2* 8.5* 8.4* 8.9* 9.3* 8.7*  HCT 29.5* 27.0* 26.5* 28.2* 28.8* 27.2*  MCV 90.8 90.9 90.8 87.9 87.3 87.2  PLT 174 163 171 208 228 322   Basic Metabolic Panel: Recent Labs  Lab 03/26/19 0340 03/28/19 0528 03/30/19 0529 03/31/19 0619 04/01/19 0529  NA 144 141 138 135 135  K 4.0 4.6 4.2 4.0 4.3  CL 109 109 105 102 99  CO2 23 22 23 22 25   GLUCOSE 90 79 116* 122* 117*  BUN 39* 36* 24* 18 24*  CREATININE 1.44* 1.37* 1.26* 1.23* 1.42*  CALCIUM 9.1 9.5 9.3 9.2 9.3  MG 1.6*  --  1.5* 1.4* 1.6*  PHOS 3.0  --  2.0* 2.2* 2.2*   GFR: Estimated Creatinine Clearance: 27.1 mL/min (A) (by C-G formula based on SCr of 1.42 mg/dL (H)). Liver Function Tests: Recent Labs  Lab 03/25/19 1802 03/30/19 0529 03/31/19 0619 04/01/19 0529  AST 23  --   --   --   ALT 12  --   --   --   ALKPHOS 70  --   --   --   BILITOT 1.0  --   --   --   PROT 6.8  --   --   --   ALBUMIN 3.6 3.1* 2.9* 2.6*   Recent Labs  Lab 03/25/19 1822  LIPASE 24   No results for input(s): AMMONIA in the last 168 hours. Coagulation Profile: Recent Labs  Lab 03/25/19 1802  INR 1.5*   Cardiac Enzymes: Recent Labs  Lab 03/25/19 1822 03/26/19 0340  TROPONINI 0.06*  0.11*   BNP (last 3 results) No results for input(s): PROBNP in the last 8760 hours. HbA1C: No results for input(s): HGBA1C in the last 72 hours. CBG: Recent Labs  Lab 03/31/19 0622 03/31/19 1127 03/31/19 1642 03/31/19 2119 04/01/19 0554  GLUCAP 127* 145* 160* 150* 109*   Lipid Profile: No results for input(s): CHOL, HDL, LDLCALC, TRIG, CHOLHDL, LDLDIRECT in the last 72 hours. Thyroid Function Tests: No results for input(s): TSH, T4TOTAL, FREET4, T3FREE, THYROIDAB in the last 72 hours. Anemia Panel: No results for input(s): VITAMINB12, FOLATE, FERRITIN, TIBC, IRON, RETICCTPCT in the last 72 hours. Sepsis Labs: Recent Labs  Lab 03/25/19 1802 03/26/19 0340  LATICACIDVEN 2.5* 1.9    Recent Results (from the past 240 hour(s))  Culture, blood (Routine x 2)     Status: None   Collection Time: 03/25/19  6:03 PM  Result Value Ref Range Status   Specimen Description BLOOD RIGHT ANTECUBITAL  Final   Special Requests   Final  BOTTLES DRAWN AEROBIC AND ANAEROBIC Blood Culture results may not be optimal due to an inadequate volume of blood received in culture bottles   Culture   Final    NO GROWTH 5 DAYS Performed at [facility], [place] [address], [place], [place] [address]    Report Status 03/30/2019 FINAL  Final  Culture, blood (Routine x 2)     Status: Abnormal   Collection Time: 03/25/19  6:32 PM  Result Value Ref Range Status   Specimen Description BLOOD RIGHT ANTECUBITAL  Final   Special Requests   Final    BOTTLES DRAWN AEROBIC AND ANAEROBIC Blood Culture adequate volume   Culture  Setup Time   Final    GRAM NEGATIVE RODS IN BOTH AEROBIC AND ANAEROBIC BOTTLES CRITICAL RESULT CALLED TO, READ BACK BY AND VERIFIED WITH: E. DEJA PHARMD, AT 1119 03/26/19 BY D. VANHOOK Performed at [facility], [place] [address] [place], [place] [address]    Culture ESCHERICHIA COLI (A)  Final   Report Status 03/28/2019 FINAL  Final   Organism ID, Bacteria ESCHERICHIA COLI   Final      Susceptibility   Escherichia coli - MIC*    AMPICILLIN <=2 SENSITIVE Sensitive     CEFAZOLIN <=4 SENSITIVE Sensitive     CEFEPIME <=1 SENSITIVE Sensitive     CEFTAZIDIME <=1 SENSITIVE Sensitive     CEFTRIAXONE <=1 SENSITIVE Sensitive     CIPROFLOXACIN <=0.25 SENSITIVE Sensitive     GENTAMICIN <=1 SENSITIVE Sensitive     IMIPENEM <=0.25 SENSITIVE Sensitive     TRIMETH/SULFA <=20 SENSITIVE Sensitive     AMPICILLIN/SULBACTAM <=2 SENSITIVE Sensitive     PIP/TAZO <=4 SENSITIVE Sensitive     Extended ESBL NEGATIVE Sensitive     * ESCHERICHIA COLI  Blood Culture ID Panel (Reflexed)     Status: Abnormal   Collection Time: 03/25/19  6:32 PM  Result Value Ref Range Status   Enterococcus species NOT DETECTED NOT DETECTED Final   Listeria monocytogenes NOT DETECTED NOT DETECTED Final   Staphylococcus species NOT DETECTED NOT DETECTED Final   Staphylococcus aureus (BCID) NOT DETECTED NOT DETECTED Final   Streptococcus species NOT DETECTED NOT DETECTED Final   Streptococcus agalactiae NOT DETECTED NOT DETECTED Final   Streptococcus pneumoniae NOT DETECTED NOT DETECTED Final   Streptococcus pyogenes NOT DETECTED NOT DETECTED Final   Acinetobacter baumannii NOT DETECTED NOT DETECTED Final   Enterobacteriaceae species DETECTED (A) NOT DETECTED Final    Comment: Enterobacteriaceae represent a large family of gram-negative bacteria, not a single organism. CRITICAL RESULT CALLED TO, READ BACK BY AND VERIFIED WITH: E. DEJA PHARMD, AT 1119 03/26/19 BY D. VANHOOK    Enterobacter cloacae complex NOT DETECTED NOT DETECTED Final   Escherichia coli DETECTED (A) NOT DETECTED Final    Comment: CRITICAL RESULT CALLED TO, READ BACK BY AND VERIFIED WITH: E. DEJA PHARMD, AT 1119 03/26/19 BY D. VANHOOK    Klebsiella oxytoca NOT DETECTED NOT DETECTED Final   Klebsiella pneumoniae NOT DETECTED NOT DETECTED Final   Proteus species NOT DETECTED NOT DETECTED Final   Serratia marcescens NOT DETECTED NOT  DETECTED Final   Carbapenem resistance NOT DETECTED NOT DETECTED Final   Haemophilus influenzae NOT DETECTED NOT DETECTED Final   Neisseria meningitidis NOT DETECTED NOT DETECTED Final   Pseudomonas aeruginosa NOT DETECTED NOT DETECTED Final   Candida albicans NOT DETECTED NOT DETECTED Final   Candida glabrata NOT DETECTED NOT DETECTED Final   Candida krusei NOT DETECTED NOT  DETECTED Final   Candida parapsilosis NOT DETECTED NOT DETECTED Final   Candida tropicalis NOT DETECTED NOT DETECTED Final    Comment: Performed at [facility], [place] [address] [place], [place] [address]  SARS Coronavirus 2 (CEPHEID- Performed in [place] [facility] lab), Hosp Order     Status: None   Collection Time: 03/25/19  6:35 PM  Result Value Ref Range Status   SARS Coronavirus 2 NEGATIVE NEGATIVE Final    Comment: (NOTE) If result is NEGATIVE SARS-CoV-2 target nucleic acids are NOT DETECTED. The SARS-CoV-2 RNA is generally detectable in upper and lower  respiratory specimens during the acute phase of infection. The lowest  concentration of SARS-CoV-2 viral copies this assay can detect is 250  copies / mL. A negative result does not preclude SARS-CoV-2 infection  and should not be used as the sole basis for treatment or other  patient management decisions.  A negative result may occur with  improper specimen collection / handling, submission of specimen other  than nasopharyngeal swab, presence of viral mutation(s) within the  areas targeted by this assay, and inadequate number of viral copies  (<250 copies / mL). A negative result must be combined with clinical  observations, patient history, and epidemiological information. If result is POSITIVE SARS-CoV-2 target nucleic acids are DETECTED. The SARS-CoV-2 RNA is generally detectable in upper and lower  respiratory specimens dur ing the acute phase of infection.  Positive  results are indicative of active infection with SARS-CoV-2.  Clinical   correlation with patient history and other diagnostic information is  necessary to determine patient infection status.  Positive results do  not rule out bacterial infection or co-infection with other viruses. If result is PRESUMPTIVE POSTIVE SARS-CoV-2 nucleic acids MAY BE PRESENT.   A presumptive positive result was obtained on the submitted specimen  and confirmed on repeat testing.  While 2019 novel coronavirus  (SARS-CoV-2) nucleic acids may be present in the submitted sample  additional confirmatory testing may be necessary for epidemiological  and / or clinical management purposes  to differentiate between  SARS-CoV-2 and other Sarbecovirus currently known to infect humans.  If clinically indicated additional testing with an alternate test  methodology [phone]) is advised. The SARS-CoV-2 RNA is generally  detectable in upper and lower respiratory sp ecimens during the acute  phase of infection. The expected result is Negative. Fact Sheet for Patients:  StrictlyIdeas.no Fact Sheet for Healthcare Providers: BankingDealers.co.za This test is not yet approved or cleared by the [place] FDA and has been authorized for detection and/or diagnosis of SARS-CoV-2 by FDA under an Emergency Use Authorization (EUA).  This EUA will remain in effect (meaning this test can be used) for the duration of the COVID-19 declaration under Section 564(b)(1) of the Act, 21 U.S.C. section 360bbb-3(b)(1), unless the authorization is terminated or revoked sooner. Performed at [facility], [place] [address], [place], [place] [address]   Urine culture     Status: Abnormal   Collection Time: 03/25/19  9:31 PM  Result Value Ref Range Status   Specimen Description URINE, CLEAN CATCH  Final   Special Requests   Final    NONE Performed at [facility], [place] [address] [place], [place] [address]    Culture (A)  Final    >=100,000 COLONIES/mL  ESCHERICHIA COLI >=100,000 COLONIES/mL KLEBSIELLA PNEUMONIAE    Report Status 03/28/2019 FINAL  Final   Organism ID, Bacteria ESCHERICHIA COLI (A)  Final   Organism  ID, Bacteria KLEBSIELLA PNEUMONIAE (A)  Final      Susceptibility   Escherichia coli - MIC*    AMPICILLIN 16 INTERMEDIATE Intermediate     CEFAZOLIN <=4 SENSITIVE Sensitive     CEFTRIAXONE <=1 SENSITIVE Sensitive     CIPROFLOXACIN >=4 RESISTANT Resistant     GENTAMICIN <=1 SENSITIVE Sensitive     IMIPENEM <=0.25 SENSITIVE Sensitive     NITROFURANTOIN 32 SENSITIVE Sensitive     TRIMETH/SULFA <=20 SENSITIVE Sensitive     AMPICILLIN/SULBACTAM 8 SENSITIVE Sensitive     PIP/TAZO <=4 SENSITIVE Sensitive     Extended ESBL NEGATIVE Sensitive     * >=100,000 COLONIES/mL ESCHERICHIA COLI   Klebsiella pneumoniae - MIC*    AMPICILLIN >=32 RESISTANT Resistant     CEFAZOLIN <=4 SENSITIVE Sensitive     CEFTRIAXONE <=1 SENSITIVE Sensitive     CIPROFLOXACIN <=0.25 SENSITIVE Sensitive     GENTAMICIN <=1 SENSITIVE Sensitive     IMIPENEM <=0.25 SENSITIVE Sensitive     NITROFURANTOIN 64 INTERMEDIATE Intermediate     TRIMETH/SULFA <=20 SENSITIVE Sensitive     AMPICILLIN/SULBACTAM 4 SENSITIVE Sensitive     PIP/TAZO <=4 SENSITIVE Sensitive     Extended ESBL NEGATIVE Sensitive     * >=100,000 COLONIES/mL KLEBSIELLA PNEUMONIAE         Radiology Studies: No results found.      Scheduled Meds: . amLODipine  2.5 mg Oral Daily  . amoxicillin-clavulanate  1 tablet Oral BID  . apixaban  2.5 mg Oral BID  . colchicine  0.6 mg Oral Daily  . Ferrous Fumarate  106 mg of iron Oral Q breakfast  . furosemide  20 mg Oral Q M,W,F  . insulin aspart  0-5 Units Subcutaneous QHS  . insulin aspart  0-9 Units Subcutaneous TID WC  . magnesium oxide  400 mg Oral Daily  . olopatadine  1 drop Both Eyes BID  . predniSONE  10 mg Oral Q breakfast   Continuous Infusions: . sodium chloride 250 mL (03/30/19 2059)     LOS: 6 days    Time spent: 25  minutes spent in the coordination of care today.    Jonnie Finner, DO [facility] Pager [phone]  If 7PM-7AM, please contact night-coverage [email or web address] Password [email or web address] 04/01/2019, 7:30 AM

## 2019-04-01 NOTE — Progress Notes (Signed)
Physical Therapy Treatment Patient Details Name: Courtney Bradford MRN: [id number] DOB: May 06, 1931 Today's Date: 04/01/2019    History of Present Illness Pt is an 85 y/o female admitted secondary to AMS. Found to have encephalopathy likely secondary to UTI. CT of head was negative for acute abnormality. Pt with elevated troponin, however, per notes, likely secondary to demand ischemia. PMH includes HTN, dHCF, a fib, DM, CKD, HTN, and gout.     PT Comments    Pt pleasant supine on arrival with complaint of continued RUE pain. Pt able to transfer to EOB with max assist of 1 but unable to fully stand or step with pt returned to bed in chair position. Pt's son, [name], then arrived and requested pt be in chair. Son assisted with 2nd trial of standing and pivot to chair as pt unable to push or assist with RUE and required max +2 assist to pivot to chair. Pt normally walking with cane and lives alone with caregivers for roughly 9 hours a day with pt alone at night. Pt cannot state what to do in an emergency and cannot move without 2 person assist at this time with family aware and agreeable for SNF needed at this time. Will continue to follow to work toward return to mod I level mobility.     Follow Up Recommendations  SNF;Supervision/Assistance - 24 hour     Equipment Recommendations       Recommendations for Other Services OT consult     Precautions / Restrictions Precautions Precautions: Fall Precaution Comments: RUE gout    Mobility  Bed Mobility Overal bed mobility: Needs Assistance Bed Mobility: Supine to Sit;Sit to Supine     Supine to sit: Max assist Sit to supine: Max assist   General bed mobility comments: pt in bed with painful RUE. Max assist to move and position RUE onto chest for pivot to left EOB. Assist to elevate trunk with mod assist to move legs to EOB x 2 trials. Return to bed with max assist to bring legs to surface and total assist to slide up in  bed  Transfers Overall transfer level: Needs assistance   Transfers: Sit to/from Stand;Stand Pivot Transfers Sit to Stand: Max assist Stand pivot transfers: Max assist;+2 physical assistance       General transfer comment: max assist with left knee blocked and LUE support to stand from bed with pt maintaining crouched posture and posterior lean x 3 attempts. Max assist to scoot EOB toward [place]. 2nd trial with son assist 2 person max assist to stand and pivot to chair with belt and pad to craddle sacrum with cues for sequential stepping  Ambulation/Gait             General Gait Details: unable   Stairs             Wheelchair Mobility    Modified Rankin (Stroke Patients Only)       Balance Overall balance assessment: Needs assistance Sitting-balance support: Feet supported;No upper extremity supported Sitting balance-Leahy Scale: Poor Sitting balance - Comments: EOB with no UE support with posterior pelvic tilt but able to maintain grossly 3 min with minguard assist   Standing balance support: Single extremity supported Standing balance-Leahy Scale: Poor Standing balance comment: reliant on knee blocked and LUE support                            Cognition Arousal/Alertness: Awake/alert Behavior During Therapy: Flat affect  Overall Cognitive Status: History of cognitive impairments - at baseline Area of Impairment: Orientation;Memory;Following commands;Problem solving                 Orientation Level: Disoriented to;Time;Situation   Memory: Decreased recall of precautions;Decreased short-term memory Following Commands: Follows one step commands with increased time     Problem Solving: Slow processing;Decreased initiation;Difficulty sequencing;Requires verbal cues;Requires tactile cues General Comments: pt stating she lives with son even though she lives at home alone with some caregivers during day. No initiation       Exercises General  Exercises - Lower Extremity Short Arc Quad: AROM;10 reps;Supine    General Comments        Pertinent Vitals/Pain Pain Assessment: 0-10 Pain Score: 5  Pain Location: RUe with movement or touch Pain Descriptors / Indicators: Aching;Constant Pain Intervention(s): Limited activity within patient's tolerance;Monitored during session;Repositioned    Home Living                      Prior Function            PT Goals (current goals can now be found in the care plan section) Acute Rehab PT Goals Time For Goal Achievement: 04/15/19 Potential to Achieve Goals: Fair Progress towards PT goals: Not progressing toward goals - comment;Goals downgraded-see care plan    Frequency           PT Plan Discharge plan needs to be updated    Co-evaluation              AM-PAC PT "6 Clicks" Mobility   Outcome Measure  Help needed turning from your back to your side while in a flat bed without using bedrails?: A Lot Help needed moving from lying on your back to sitting on the side of a flat bed without using bedrails?: A Lot Help needed moving to and from a bed to a chair (including a wheelchair)?: Total Help needed standing up from a chair using your arms (e.g., wheelchair or bedside chair)?: Total Help needed to walk in hospital room?: Total Help needed climbing 3-5 steps with a railing? : Total 6 Click Score: 8    End of Session Equipment Utilized During Treatment: Gait belt Activity Tolerance: Patient tolerated treatment well Patient left: in chair;with call bell/phone within reach;with chair alarm set;with family/visitor present Nurse Communication: Mobility status;Precautions PT Visit Diagnosis: Other abnormalities of gait and mobility (R26.89);Muscle weakness (generalized) (M62.81);Unsteadiness on feet (R26.81)     Time: 3790-2409 PT Time Calculation (min) (ACUTE ONLY): 36 min  Charges:  $Therapeutic Activity: 23-37 mins                     [place], PT Acute Rehabilitation Services Pager: [phone] Office: [phone]    Ransome Helwig B Sedale Jenifer 04/01/2019, 1:12 PM

## 2019-04-01 NOTE — TOC Progression Note (Addendum)
Transition of Care [facility]) - Progression Note    Patient Details  Name: ISIDRA MINGS MRN: [id number] Date of Birth: 1931/09/09  [facility] CM/SW [place], LCSW Phone Number: 04/01/2019, 12:25 PM  Clinical Narrative: Per son, since patient is unable to ambulate at this time they have agreed on SNF placement. Only preference is [facility]. If they are unable to take her plan is for her to return home with CNA's. Will send referral once PT note reflects SNF recommendation.  1:49 pm: Referral sent to [facility]. Left voicemail for admissions coordinator, Marianna Fuss, asking her to review referral and call with decision.  2:25 pm: [facility] is able to offer patient a bed once stable for discharge. Per MD, she will likely be ready tomorrow. Son is aware. They will not need another COVID test.   Expected Discharge Plan: Home/Self Care Barriers to Discharge: No Barriers Identified  Expected Discharge Plan and Services Expected Discharge Plan: Home/Self Care In-house Referral: NA Discharge Planning Services: CM Consult Post Acute Care Choice: NA Living arrangements for the past 2 months: Single Family Home                 DME Arranged: N/A DME Agency: NA                   Social Determinants of Health (SDOH) Interventions    Readmission Risk Interventions No flowsheet data found.

## 2019-04-01 NOTE — NC FL2 (Signed)
[place] LEVEL OF CARE SCREENING TOOL     IDENTIFICATION  Patient Name: Courtney Bradford Birthdate: 1931/02/25 Sex: female Admission Date (Current Location): 03/25/2019  [facility] and [place] Number:  [employer] and Address:  [facility], [place] [address] [place], [place] [address]      Provider Number: [id number]  Attending Physician Name and Address:  Jonnie Finner, DO  Relative Name and Phone Number:       Current Level of Care: Hospital Recommended Level of Care: [place] Prior Approval Number:    Date Approved/Denied:   PASRR Number: [id number]  Discharge Plan: SNF    Current Diagnoses: Patient Active Problem List   Diagnosis Date Noted  . Bacteremia due to Gram-negative bacteria 03/27/2019  . CKD (chronic kidney disease), stage III ([place]) 03/26/2019  . Hypomagnesemia 03/26/2019  . Hydroureteronephrosis 03/26/2019  . Elevated troponin 03/26/2019  . Demand ischemia ([place]) 03/26/2019  . Sinus bradycardia 03/26/2019  . First degree AV block 03/26/2019  . Aortic atherosclerosis ([place]) 03/26/2019  . Mild cardiomegaly 03/26/2019  . Mobitz type 2 second degree atrioventricular block   . Acute encephalopathy 02/28/2016  . Lower urinary tract infectious disease   . Generalized weakness 11/11/2015  . Gout   . Chronic diastolic CHF (congestive heart failure) ([place]) 07/22/2014  . Atrial fibrillation, chronic ([place]) 07/22/2014  . Type 2 diabetes, controlled, with renal manifestation ([place]) 07/22/2014  . SUI (stress urinary incontinence, female) 05/16/2011  . Pelvic relaxation 05/16/2011  . Essential hypertension 11/03/2009  . PREMATURE VENTRICULAR CONTRACTIONS 11/03/2009    Orientation RESPIRATION BLADDER Height & Weight     Self, Place  Normal Incontinent, External catheter Weight: 148 lb 2.4 oz (67.2 kg) Height:  5\' 6"  (167.6 cm)  BEHAVIORAL SYMPTOMS/MOOD NEUROLOGICAL BOWEL NUTRITION STATUS  (None)  (None) Continent Diet(Heart healthy/Consistent carbohydrate diet)  AMBULATORY STATUS COMMUNICATION OF NEEDS Skin   Extensive Assist Verbally Other (Comment)(Left index finger amputated.)                       Personal Care Assistance Level of Assistance  Bathing, Feeding, Dressing Bathing Assistance: Limited assistance Feeding assistance: Limited assistance Dressing Assistance: Limited assistance     Functional Limitations Info  Sight, Hearing, Speech Sight Info: Adequate Hearing Info: Adequate Speech Info: Adequate    SPECIAL CARE FACTORS FREQUENCY  PT (By licensed PT), OT (By licensed OT)     PT Frequency: 5 x week OT Frequency: 5 x week            Contractures Contractures Info: Not present    Additional Factors Info  Code Status, Allergies Code Status Info: Full Allergies Info: Lactose Intolerance (Gi)           Current Medications (04/01/2019):  This is the current hospital active medication list Current Facility-Administered Medications  Medication Dose Route Frequency Provider Last Rate Last Dose  . 0.9 %  sodium chloride infusion   Intravenous PRN Rama, Venetia Maxon, MD 10 mL/hr at 03/30/19 2059 250 mL at 03/30/19 2059  . acetaminophen (TYLENOL) tablet 325 mg  325 mg Oral Q6H PRN Marylyn Ishihara, Tyrone A, DO   325 mg at 03/31/19 2120  . amLODipine (NORVASC) tablet 2.5 mg  2.5 mg Oral Daily Dana Allan I, MD   2.5 mg at 04/01/19 1031  . amoxicillin-clavulanate (AUGMENTIN) 500-125 MG per tablet 500 mg  1 tablet Oral BID Kyle, Tyrone A, DO   500 mg at  04/01/19 1031  . apixaban (ELIQUIS) tablet 2.5 mg  2.5 mg Oral BID Dana Allan I, MD   2.5 mg at 04/01/19 1031  . atropine 1 MG/10ML injection 0.5 mg  0.5 mg Intravenous PRN Kirby-Graham, Karsten Fells, NP      . colchicine tablet 0.6 mg  0.6 mg Oral Daily Kyle, Tyrone A, DO   0.6 mg at 04/01/19 1031  . Ferrous Fumarate (HEMOCYTE - 106 mg FE) tablet 106 mg of iron  106 mg of iron Oral Q breakfast Dana Allan I,  MD   106 mg of iron at 04/01/19 2574  . furosemide (LASIX) tablet 20 mg  20 mg Oral Q M,W,F Dana Allan I, MD   20 mg at 03/31/19 1027  . hydrALAZINE (APRESOLINE) injection 5 mg  5 mg Intravenous Q6H PRN Marylyn Ishihara, Tyrone A, DO      . insulin aspart (novoLOG) injection 0-5 Units  0-5 Units Subcutaneous QHS Dana Allan I, MD   2 Units at 03/30/19 2158  . insulin aspart (novoLOG) injection 0-9 Units  0-9 Units Subcutaneous TID WC Dana Allan I, MD   2 Units at 04/01/19 1219  . olopatadine (PATANOL) 0.1 % ophthalmic solution 1 drop  1 drop Both Eyes BID Dana Allan I, MD   1 drop at 04/01/19 1033  . predniSONE (DELTASONE) tablet 10 mg  10 mg Oral Q breakfast Marylyn Ishihara, Tyrone A, DO   10 mg at 04/01/19 9355  . senna-docusate (Senokot-S) tablet 2 tablet  2 tablet Oral QHS PRN Gardiner Barefoot, NP   2 tablet at 03/31/19 2128     Discharge Medications: Please see discharge summary for a list of discharge medications.  Relevant Imaging Results:  Relevant Lab Results:   Additional Information SS#: [id number]  Candie Chroman, LCSW

## 2019-04-01 NOTE — Care Management Important Message (Signed)
Important Message  Patient Details  Name: Courtney Bradford MRN: [id number] Date of Birth: 1931-04-13   Medicare Important Message Given:  Yes    Orbie Pyo 04/01/2019, 12:06 PM

## 2019-04-02 ENCOUNTER — Inpatient Hospital Stay (HOSPITAL_COMMUNITY): Payer: Medicare Other

## 2019-04-02 DIAGNOSIS — R609 Edema, unspecified: Secondary | ICD-10-CM

## 2019-04-02 DIAGNOSIS — N189 Chronic kidney disease, unspecified: Secondary | ICD-10-CM | POA: Diagnosis present

## 2019-04-02 DIAGNOSIS — M109 Gout, unspecified: Secondary | ICD-10-CM | POA: Diagnosis present

## 2019-04-02 DIAGNOSIS — S68111A Complete traumatic metacarpophalangeal amputation of left index finger, initial encounter: Secondary | ICD-10-CM | POA: Diagnosis present

## 2019-04-02 DIAGNOSIS — D631 Anemia in chronic kidney disease: Secondary | ICD-10-CM | POA: Diagnosis present

## 2019-04-02 LAB — CBC WITH DIFFERENTIAL/PLATELET
Abs Immature Granulocytes: 0.25 10*3/uL — ABNORMAL HIGH (ref 0.00–0.07)
Basophils Absolute: 0.1 10*3/uL (ref 0.0–0.1)
Basophils Relative: 1 %
Eosinophils Absolute: 0.2 10*3/uL (ref 0.0–0.5)
Eosinophils Relative: 1 %
HCT: 26.1 % — ABNORMAL LOW (ref 36.0–46.0)
Hemoglobin: 8.4 g/dL — ABNORMAL LOW (ref 12.0–15.0)
Immature Granulocytes: 2 %
Lymphocytes Relative: 17 %
Lymphs Abs: 1.8 10*3/uL (ref 0.7–4.0)
MCH: 28.1 pg (ref 26.0–34.0)
MCHC: 32.2 g/dL (ref 30.0–36.0)
MCV: 87.3 fL (ref 80.0–100.0)
Monocytes Absolute: 1 10*3/uL (ref 0.1–1.0)
Monocytes Relative: 9 %
Neutro Abs: 7.2 10*3/uL (ref 1.7–7.7)
Neutrophils Relative %: 70 %
Platelets: 335 10*3/uL (ref 150–400)
RBC: 2.99 MIL/uL — ABNORMAL LOW (ref 3.87–5.11)
RDW: 14.4 % (ref 11.5–15.5)
WBC: 10.4 10*3/uL (ref 4.0–10.5)
nRBC: 0 % (ref 0.0–0.2)

## 2019-04-02 LAB — GLUCOSE, CAPILLARY
Glucose-Capillary: 105 mg/dL — ABNORMAL HIGH (ref 70–99)
Glucose-Capillary: 150 mg/dL — ABNORMAL HIGH (ref 70–99)
Glucose-Capillary: 139 mg/dL — ABNORMAL HIGH (ref 70–99)
Glucose-Capillary: 48 mg/dL — ABNORMAL LOW (ref 70–99)
Glucose-Capillary: 120 mg/dL — ABNORMAL HIGH (ref 70–99)
Glucose-Capillary: 126 mg/dL — ABNORMAL HIGH (ref 70–99)

## 2019-04-02 LAB — RENAL FUNCTION PANEL
Albumin: 2.4 g/dL — ABNORMAL LOW (ref 3.5–5.0)
Anion gap: 12 (ref 5–15)
BUN: 42 mg/dL — ABNORMAL HIGH (ref 8–23)
CO2: 23 mmol/L (ref 22–32)
Calcium: 9.1 mg/dL (ref 8.9–10.3)
Chloride: 98 mmol/L (ref 98–111)
Creatinine, Ser: 1.91 mg/dL — ABNORMAL HIGH (ref 0.44–1.00)
GFR calc Af Amer: 27 mL/min — ABNORMAL LOW (ref >60)
GFR calc non Af Amer: 23 mL/min — ABNORMAL LOW (ref >60)
Glucose, Bld: 104 mg/dL — ABNORMAL HIGH (ref 70–99)
Phosphorus: 2.6 mg/dL (ref 2.5–4.6)
Potassium: 4.8 mmol/L (ref 3.5–5.1)
Sodium: 133 mmol/L — ABNORMAL LOW (ref 135–145)

## 2019-04-02 LAB — MAGNESIUM: Magnesium: 1.8 mg/dL (ref 1.7–2.4)

## 2019-04-02 LAB — C-REACTIVE PROTEIN: CRP: 18.9 mg/dL — ABNORMAL HIGH (ref <1.0)

## 2019-04-02 LAB — SEDIMENTATION RATE: Sed Rate: 104 mm/hr — ABNORMAL HIGH (ref 0–22)

## 2019-04-02 NOTE — TOC Progression Note (Signed)
Transition of Care [facility]) - Progression Note    Patient Details  Name: CEIRA HOESCHEN MRN: [id number] Date of Birth: 07/04/1931  Transition of Care [facility]) CM/SW [place], LCSW Phone Number: 04/02/2019, 2:52 PM  Clinical Narrative: Patient has insurance approval to discharge to [facility] when stable. Authorization will be active for one week.  Expected Discharge Plan: Home/Self Care Barriers to Discharge: No Barriers Identified  Expected Discharge Plan and Services Expected Discharge Plan: Home/Self Care In-house Referral: NA Discharge Planning Services: CM Consult Post Acute Care Choice: NA Living arrangements for the past 2 months: Single Family Home                 DME Arranged: N/A DME Agency: NA                   Social Determinants of Health (SDOH) Interventions    Readmission Risk Interventions No flowsheet data found.

## 2019-04-02 NOTE — Progress Notes (Signed)
Hypoglycemic Event  CBG: 48  Treatment: 2 cups orange Juice  Symptoms: asymptomatic  Follow-up CBG: Time:2238 CBG Result:126  Possible Reasons for Event: poor appetite, offered but refused see note.  Comments/MD notified:*no    Tanya Nones D Antavia Tandy

## 2019-04-02 NOTE — Progress Notes (Signed)
Noted that the meal tray was barely touched. encourage to eat  But pt stated "I am full right now" will monitor

## 2019-04-02 NOTE — Discharge Instructions (Signed)

## 2019-04-02 NOTE — Consult Note (Signed)
Reason for Consult:Right wrist pain Referring Physician: P Trachelle Bradford is an 85 y.o. female.  HPI: Courtney Bradford was admitted to the hospital with UTI and bacteremia. She is being treated for gout in her right wrist but hasn't responded to treatment and hand surgery was consulted to r/o septic joint. She denied wrist pain to me so her history probably cannot be trusted.  Past Medical History:  Diagnosis Date  . A-fib ([place])   . Anemia   . Arthritis    right knee  . Cataract   . CHF (congestive heart failure) ([place])   . CKD (chronic kidney disease), stage III ([place])   . Dehydration   . Diabetes mellitus   . Gout   . Headache(784.0)   . Hypertension   . UTI (lower urinary tract infection)     Past Surgical History:  Procedure Laterality Date  . ABDOMINAL HYSTERECTOMY    . AMPUTATION FINGER Left 01/22/2019   Procedure: Amputation Left Index Finger First Joint;  Surgeon: Courtney Planas, MD;  Location: [facility];  Service: Orthopedics;  Laterality: Left;  . ANTERIOR AND POSTERIOR REPAIR  05/17/2011   Procedure: ANTERIOR (CYSTOCELE) AND POSTERIOR REPAIR (RECTOCELE);  Surgeon: Courtney Hose, MD;  Location: [facility];  Service: Gynecology;  Laterality: N/A;  Anterior repair with TVT bladder sling and cystoscopy  . BLADDER SUSPENSION  05/17/2011   Procedure: TRANSVAGINAL TAPE (TVT) PROCEDURE;  Surgeon: Courtney Hose, MD;  Location: [facility];  Service: Gynecology;  Laterality: N/A;  . BREAST SURGERY     breast biopsy  . I&D EXTREMITY Left 01/22/2019   Procedure: IRRIGATION AND DEBRIDEMENT OF LEFT INDEX FINGER,;  Surgeon: Courtney Planas, MD;  Location: [facility];  Service: Orthopedics;  Laterality: Left;    History reviewed. No pertinent family history.  Social History:  reports that she has never smoked. She has never used smokeless tobacco. She reports that she does not drink alcohol or use drugs.  Allergies:  Allergies  Allergen Reactions  . Lactose Intolerance (Gi) Other (See Comments)     Upset stomach    Medications: I have reviewed the patient's current medications.  Results for orders placed or performed during the hospital encounter of 03/25/19 (from the past 48 hour(s))  Glucose, capillary     Status: Abnormal   Collection Time: 03/31/19  4:42 PM  Result Value Ref Range   Glucose-Capillary 160 (H) 70 - 99 mg/dL  Glucose, capillary     Status: Abnormal   Collection Time: 03/31/19  9:19 PM  Result Value Ref Range   Glucose-Capillary 150 (H) 70 - 99 mg/dL  CBC with Differential/Platelet     Status: Abnormal   Collection Time: 04/01/19  5:29 AM  Result Value Ref Range   WBC 13.3 (H) 4.0 - 10.5 K/uL   RBC 3.12 (L) 3.87 - 5.11 MIL/uL   Hemoglobin 8.7 (L) 12.0 - 15.0 g/dL   HCT 27.2 (L) 36.0 - 46.0 %   MCV 87.2 80.0 - 100.0 fL   MCH 27.9 26.0 - 34.0 pg   MCHC 32.0 30.0 - 36.0 g/dL   RDW 14.2 11.5 - 15.5 %   Platelets 269 150 - 400 K/uL   nRBC 0.0 0.0 - 0.2 %   Neutrophils Relative % 78 %   Neutro Abs 10.5 (H) 1.7 - 7.7 K/uL   Lymphocytes Relative 8 %   Lymphs Abs 1.1 0.7 - 4.0 K/uL   Monocytes Relative 10 %   Monocytes Absolute 1.3 (H) 0.1 -  1.0 K/uL   Eosinophils Relative 1 %   Eosinophils Absolute 0.1 0.0 - 0.5 K/uL   Basophils Relative 0 %   Basophils Absolute 0.1 0.0 - 0.1 K/uL   Immature Granulocytes 3 %   Abs Immature Granulocytes 0.33 (H) 0.00 - 0.07 K/uL    Comment: Performed at [facility] [address] [place], [place] [address]  Magnesium     Status: Abnormal   Collection Time: 04/01/19  5:29 AM  Result Value Ref Range   Magnesium 1.6 (L) 1.7 - 2.4 mg/dL    Comment: Performed at [facility] [address], [place], [place] [address]  Renal function panel     Status: Abnormal   Collection Time: 04/01/19  5:29 AM  Result Value Ref Range   Sodium 135 135 - 145 mmol/L   Potassium 4.3 3.5 - 5.1 mmol/L   Chloride 99 98 - 111 mmol/L   CO2 25 22 - 32 mmol/L   Glucose, Bld 117 (H) 70 - 99 mg/dL   BUN 24 (H) 8 - 23 mg/dL    Creatinine, Ser 1.42 (H) 0.44 - 1.00 mg/dL   Calcium 9.3 8.9 - 10.3 mg/dL   Phosphorus 2.2 (L) 2.5 - 4.6 mg/dL   Albumin 2.6 (L) 3.5 - 5.0 g/dL   GFR calc non Af Amer 34 (L) >60 mL/min   GFR calc Af Amer 39 (L) >60 mL/min   Anion gap 11 5 - 15    Comment: Performed at [facility] [address], [place], [place] [address]  Glucose, capillary     Status: Abnormal   Collection Time: 04/01/19  5:54 AM  Result Value Ref Range   Glucose-Capillary 109 (H) 70 - 99 mg/dL  Glucose, capillary     Status: Abnormal   Collection Time: 04/01/19 12:03 PM  Result Value Ref Range   Glucose-Capillary 160 (H) 70 - 99 mg/dL  Glucose, capillary     Status: Abnormal   Collection Time: 04/01/19  5:10 PM  Result Value Ref Range   Glucose-Capillary 137 (H) 70 - 99 mg/dL  Glucose, capillary     Status: Abnormal   Collection Time: 04/01/19  9:12 PM  Result Value Ref Range   Glucose-Capillary 172 (H) 70 - 99 mg/dL   Comment 1 Notify RN    Comment 2 Document in Chart   CBC with Differential/Platelet     Status: Abnormal   Collection Time: 04/02/19  4:32 AM  Result Value Ref Range   WBC 10.4 4.0 - 10.5 K/uL   RBC 2.99 (L) 3.87 - 5.11 MIL/uL   Hemoglobin 8.4 (L) 12.0 - 15.0 g/dL   HCT 26.1 (L) 36.0 - 46.0 %   MCV 87.3 80.0 - 100.0 fL   MCH 28.1 26.0 - 34.0 pg   MCHC 32.2 30.0 - 36.0 g/dL   RDW 14.4 11.5 - 15.5 %   Platelets 335 150 - 400 K/uL   nRBC 0.0 0.0 - 0.2 %   Neutrophils Relative % 70 %   Neutro Abs 7.2 1.7 - 7.7 K/uL   Lymphocytes Relative 17 %   Lymphs Abs 1.8 0.7 - 4.0 K/uL   Monocytes Relative 9 %   Monocytes Absolute 1.0 0.1 - 1.0 K/uL   Eosinophils Relative 1 %   Eosinophils Absolute 0.2 0.0 - 0.5 K/uL   Basophils Relative 1 %   Basophils Absolute 0.1 0.0 - 0.1 K/uL   Immature Granulocytes 2 %   Abs Immature  Granulocytes 0.25 (H) 0.00 - 0.07 K/uL    Comment: Performed at [facility], [place] [address], [place], [place] [address]  Magnesium     Status: None   Collection  Time: 04/02/19  4:32 AM  Result Value Ref Range   Magnesium 1.8 1.7 - 2.4 mg/dL    Comment: Performed at [facility] [address] [place], [place] [address]  Renal function panel     Status: Abnormal   Collection Time: 04/02/19  4:32 AM  Result Value Ref Range   Sodium 133 (L) 135 - 145 mmol/L   Potassium 4.8 3.5 - 5.1 mmol/L   Chloride 98 98 - 111 mmol/L   CO2 23 22 - 32 mmol/L   Glucose, Bld 104 (H) 70 - 99 mg/dL   BUN 42 (H) 8 - 23 mg/dL   Creatinine, Ser 1.91 (H) 0.44 - 1.00 mg/dL   Calcium 9.1 8.9 - 10.3 mg/dL   Phosphorus 2.6 2.5 - 4.6 mg/dL   Albumin 2.4 (L) 3.5 - 5.0 g/dL   GFR calc non Af Amer 23 (L) >60 mL/min   GFR calc Af Amer 27 (L) >60 mL/min   Anion gap 12 5 - 15    Comment: Performed at [facility] [address], [place], [place] [address]  Glucose, capillary     Status: Abnormal   Collection Time: 04/02/19  5:41 AM  Result Value Ref Range   Glucose-Capillary 105 (H) 70 - 99 mg/dL  Glucose, capillary     Status: Abnormal   Collection Time: 04/02/19 12:07 PM  Result Value Ref Range   Glucose-Capillary 150 (H) 70 - 99 mg/dL   Comment 1 Notify RN    Comment 2 Document in Chart   Sedimentation rate     Status: Abnormal   Collection Time: 04/02/19  1:16 PM  Result Value Ref Range   Sed Rate 104 (H) 0 - 22 mm/hr    Comment: Performed at [facility], [place] [address] [place], [place] [address]  C-reactive protein     Status: Abnormal   Collection Time: 04/02/19  1:16 PM  Result Value Ref Range   CRP 18.9 (H) <1.0 mg/dL    Comment: Performed at [facility] [address] [place], [place] [address]    Dg Wrist Complete Right  Result Date: 04/02/2019 CLINICAL DATA:  Generalized wrist pain, no known injury, initial encounter EXAM: RIGHT WRIST - COMPLETE 3+ VIEW COMPARISON:  None. FINDINGS: Mild degenerative changes are noted the first [facility] joint. Some calcifications are noted along the anterior aspect of the carpal bones. Mild  generalized soft tissue swelling is noted without acute bony abnormality. IMPRESSION: Chronic changes without acute abnormality. Electronically Signed   By: Inez Catalina M.D.   On: 04/02/2019 15:13   Vas [place] Upper Extremity Venous Duplex  Result Date: 04/02/2019 UPPER VENOUS STUDY  Indications: Pain Limitations: Adduction of arm to side. Performing Technologist: June Leap RDMS, RVT  Examination Guidelines: A complete evaluation includes B-mode imaging, spectral Doppler, color Doppler, and power Doppler as needed of all accessible portions of each vessel. Bilateral testing is considered an integral part of a complete examination. Limited examinations for reoccurring indications may be performed as noted.  Right Findings: +----------+------------+---------+-----------+----------+--------------+ RIGHT     CompressiblePhasicitySpontaneousProperties   Summary     +----------+------------+---------+-----------+----------+--------------+ IJV           Full       Yes  Yes                             +----------+------------+---------+-----------+----------+--------------+ Subclavian    Full       Yes       Yes                             +----------+------------+---------+-----------+----------+--------------+ Axillary                                            Not visualized +----------+------------+---------+-----------+----------+--------------+ Brachial      Full       Yes       Yes                             +----------+------------+---------+-----------+----------+--------------+ Radial        Full                                                 +----------+------------+---------+-----------+----------+--------------+ Ulnar         Full                                                 +----------+------------+---------+-----------+----------+--------------+ Cephalic      Full                                                  +----------+------------+---------+-----------+----------+--------------+ Basilic                                             Not visualized +----------+------------+---------+-----------+----------+--------------+  Left Findings: +----------+------------+---------+-----------+----------+-------+ LEFT      CompressiblePhasicitySpontaneousPropertiesSummary +----------+------------+---------+-----------+----------+-------+ Subclavian               Yes       Yes                      +----------+------------+---------+-----------+----------+-------+  Summary:  Right: No evidence of deep vein thrombosis in the upper extremity. No evidence of superficial vein thrombosis in the upper extremity. Limited exam- see comments above.  Left: No evidence of thrombosis in the subclavian.  *See table(s) above for measurements and observations.    Preliminary     Review of Systems  Constitutional: Negative for weight loss.  HENT: Negative for ear discharge, ear pain, hearing loss and tinnitus.   Eyes: Negative for blurred vision, double vision, photophobia and pain.  Respiratory: Negative for cough, sputum production and shortness of breath.   Cardiovascular: Negative for chest pain.  Gastrointestinal: Negative for abdominal pain, nausea and vomiting.  Genitourinary: Negative for dysuria, flank pain, frequency and urgency.  Musculoskeletal: Negative for back pain, falls, joint pain, myalgias and neck pain.  Neurological: Negative for dizziness, tingling, sensory change, focal  weakness, loss of consciousness and headaches.  Endo/Heme/Allergies: Does not bruise/bleed easily.  Psychiatric/Behavioral: Negative for depression, memory loss and substance abuse. The patient is not nervous/anxious.    Blood pressure (!) 157/80, pulse 77, temperature 98.4 F (36.9 C), temperature source Oral, resp. rate 18, height 5\' 6"  (1.676 m), weight 68.7 kg, SpO2 98 %. Physical Exam  Constitutional: She appears  well-developed and well-nourished. No distress.  HENT:  Head: Normocephalic and atraumatic.  Eyes: Conjunctivae are normal. Right eye exhibits no discharge. Left eye exhibits no discharge. No scleral icterus.  Neck: Normal range of motion.  Cardiovascular: Normal rate and regular rhythm.  Respiratory: Effort normal. No respiratory distress.  Musculoskeletal:     Comments: Right shoulder, elbow, wrist, digits- no skin wounds, wrist warm, TTP, no instability, no blocks to motion except pain, PROM ~30 degrees without pain  Sens  Ax/R/M/U intact  Mot   Ax/ R/ PIN/ M/ AIN/ U intact  Rad 2+  Left shoulder, elbow, wrist, digits- s/Courtney index finger amputation, sutures in place, nontender, no instability, no blocks to motion  Sens  Ax/R/M/U intact  Mot   Ax/ R/ PIN/ M/ AIN/ U intact  Rad 2+  Neurological: She is alert.  Skin: Skin is warm and dry. She is not diaphoretic.  Psychiatric: She has a normal mood and affect. Her behavior is normal.    Assessment/Plan: Right wrist pain -- Doubt septic given degree of movement but have recommended IR aspiration for GS, C&S, and cell count. We will get involved if tap comes back suspicious for infection.    Lisette Abu, PA-C Orthopedic Surgery [phone] 04/02/2019, 3:49 PM

## 2019-04-02 NOTE — Progress Notes (Signed)
[facility] Progress Note  Patient: Courtney Bradford ZSW:[id number]   PCP: Iona Beard, MD DOB: 09-05-1931   DOA: 03/25/2019   DOS: 04/02/2019   Date of Service: the patient was seen and examined on 04/02/2019  Brief hospital course: Pt. with PMH of HTN, gout, type II DM, CKD 3, HFpEF, A. fib on Eliquis; admitted on 03/25/2019, presented with complaint of fever and confusion, was found to have sepsis secondary to E. coli and enterococcus bacteremia. Currently further plan is Continue current treatment for right wrist acute arthritis.  Subjective: Continues to have severe right wrist pain.  No nausea no vomiting no fever no chills.  No chest pain abdominal pain.  Still somewhat confused.  As per my discussion with the family patient not back to baseline.  But better than yesterday.  Assessment and Plan: 1. Principal Problem:   Acute encephalopathy-metabolic Sepsis secondary to UTI and bacteremia secondary to E. Coli as well as Klebsiella. Sepsis POA-now resolved Left hydroureteronephrosis Presents with confusion. CT of the head negative for any acute finding.  Show significant atrophy. Blood cultures positive for E. coli. Urine culture positive for E. coli as well as Klebsiella. CT abdomen pelvis on admission with contrast shows evidence of left-sided hydroureteronephrosis without any obstructing stone with small amount of gas in the bladder. No other acute abnormality. Prior provider discussed with ID, patient completed 6 days of IV antibiotics ceftriaxone followed by cefazolin based on sensitivity and currently on oral Augmentin. Plan to continue Augmentin last day 04/08/2019. Mentation improved but not back to baseline per family.  Anticipating improvement down the road as the patient leaves the hospital and then back to family requirement.  2.  Acute right wrist gout. Received colchicine 1.2 mg loading dose followed by 0.6 mg daily followed by currently 1.3 mg daily based on the  worsening renal function. Family is aware of this medication and would like to continue this medication on a daily basis. Patient received prednisone for 3 days and currently off. Currently holding off on using it further as there is a concern for septic arthritis given her bacteremia. Appreciate orthopedic input. IR consulted for wrist aspiration.  Blood works ordered. ESR CRP significantly elevated.  3.  Acute worsening of chronic kidney disease stage III. Patient's baseline serum creatinine is around 2 recently. Presented with creatinine of around 1.4.  Now trending over the way up to 1.9. Monitor for now. Recommend outpatient nephrology consultation. Patient and family would not want hemodialysis.  4. Essential hypertension   Chronic diastolic CHF (congestive heart failure) (HCC)   Atrial fibrillation, chronic (HCC)   Elevated troponin   Demand ischemia (HCC)   Sinus bradycardia   First degree AV block On Eliquis at home which we will continue. Continue Norvasc 2.5 mg daily. Clinically cortical thickening 6. Rate controlled  5. Type 2 diabetes, controlled, with renal manifestation (HCC) On 30 units of Humulin at home on a daily basis. Currently on sliding scale insulin. sugars well controlled.  6.  Hypomagnesemia Currently resolved.  7.  Anemia due to chronic kidney disease Hemoglobin stable. No active bleeding. Monitor.  8. Amputation of left index finger Performed in March 2020. Per family patient still has sutures there. Request orthopedic consultation. Sutures will be removed 04/02/2019.  Diet: Cardiac diet DVT Prophylaxis: Therapeutic Anticoagulation with Eliquis  Advance goals of care discussion: Full code-as per my discussion with patient's son they would not want hemodialysis.  Family Communication: no family was present at bedside, at the time  of interview.  Discussed with patient's son on the phone on 04/02/2019   Disposition:  Discharge to [facility] likely Friday.  Consultants: Orthopedics, IR Procedures: Joint aspiration  Scheduled Meds: . amLODipine  2.5 mg Oral Daily  . amoxicillin-clavulanate  1 tablet Oral BID  . apixaban  2.5 mg Oral BID  . colchicine  0.3 mg Oral Daily  . Ferrous Fumarate  106 mg of iron Oral Q breakfast  . insulin aspart  0-5 Units Subcutaneous QHS  . insulin aspart  0-9 Units Subcutaneous TID WC  . olopatadine  1 drop Both Eyes BID   Continuous Infusions: . sodium chloride 250 mL (03/30/19 2059)   PRN Meds: sodium chloride, acetaminophen, hydrALAZINE, senna-docusate Antibiotics: Anti-infectives (From admission, onward)   Start     Dose/Rate Route Frequency Ordered Stop   03/31/19 1000  amoxicillin-clavulanate (AUGMENTIN) 875-125 MG per tablet 1 tablet  Status:  Discontinued     1 tablet Oral Every 12 hours 03/31/19 0919 03/31/19 0928   03/31/19 1000  amoxicillin-clavulanate (AUGMENTIN) 500-125 MG per tablet 500 mg     1 tablet Oral 2 times daily 03/31/19 0929     03/28/19 2200  ceFAZolin (ANCEF) IVPB 2g/100 mL premix  Status:  Discontinued     2 g 200 mL/hr over 30 Minutes Intravenous Every 12 hours 03/28/19 1231 03/31/19 0919   03/26/19 2200  cefTRIAXone (ROCEPHIN) 1 g in sodium chloride 0.9 % 100 mL IVPB  Status:  Discontinued     1 g 200 mL/hr over 30 Minutes Intravenous Every 24 hours 03/26/19 0217 03/26/19 1138   03/26/19 2200  cefTRIAXone (ROCEPHIN) 2 g in sodium chloride 0.9 % 100 mL IVPB  Status:  Discontinued     2 g 200 mL/hr over 30 Minutes Intravenous Every 24 hours 03/26/19 1138 03/28/19 1231   03/25/19 2200  cefTRIAXone (ROCEPHIN) 1 g in sodium chloride 0.9 % 100 mL IVPB     1 g 200 mL/hr over 30 Minutes Intravenous  Once 03/25/19 2151 03/25/19 2234       Objective: Physical Exam: Vitals:   04/01/19 2035 04/02/19 0355 04/02/19 1033 04/02/19 1341  BP: (!) 157/78 (!) 149/79 139/65 (!) 157/80  Pulse: 81 86 79 77  Resp: _0 Temp: 98.5 F (36.9 C) 98.4 F (36.9 C)   98.4 F (36.9 C)  TempSrc: Oral Oral  Oral  SpO2: 96% 100% 100% 98%  Weight:  68.7 kg    Height:        Intake/Output Summary (Last 24 hours) at 04/02/2019 1740 Last data filed at 04/02/2019 1300 Gross per 24 hour  Intake 900 ml  Output 501 ml  Net 399 ml   Filed Weights   03/31/19 0407 04/01/19 0423 04/02/19 0355  Weight: 66.3 kg 67.2 kg 68.7 kg   General: alert and oriented to place and person. Appear in mild distress, affect flat in affect Eyes: PERRL, Conjunctiva normal ENT: Oral Mucosa Clear, moist  Neck: difficult to assess  JVD, no Abnormal Mass Or lumps Cardiovascular: S1 and S2 Present, no Murmur, peripheral pulses symmetrical and radial normal Respiratory: normal respiratory effort, Bilateral Air entry equal and Decreased, no use of accessory muscle, Clear to Auscultation, no Crackles, no wheezes Abdomen: Bowel Sound present, Soft and no tenderness, no hernia Skin: no rashes  Extremities: Right upper extremity significant swelling,  no pedal edema, no calf tenderness Neurologic: normal without focal findings, mental status, speech normal, alert and oriented x3, PERLA, Motor  strength 5/5 and symmetric and sensation grossly normal Gait not checked due to patient safety concerns  Data Reviewed: CBC: Recent Labs  Lab 03/28/19 0528 03/30/19 0529 03/31/19 0619 04/01/19 0529 04/02/19 0432  WBC 7.4 8.9 12.8* 13.3* 10.4  NEUTROABS  --  6.7 10.2* 10.5* 7.2  HGB 8.4* 8.9* 9.3* 8.7* 8.4*  HCT 26.5* 28.2* 28.8* 27.2* 26.1*  MCV 90.8 87.9 87.3 87.2 87.3  PLT 171 208 228 269 093   Basic Metabolic Panel: Recent Labs  Lab 03/28/19 0528 03/30/19 0529 03/31/19 0619 04/01/19 0529 04/02/19 0432  NA 141 138 135 135 133*  K 4.6 4.2 4.0 4.3 4.8  CL 109 105 102 99 98  CO2 _0 GLUCOSE 79 116* 122* 117* 104*  BUN 36* 24* 18 24* 42*  CREATININE 1.37* 1.26* 1.23* 1.42* 1.91*  CALCIUM 9.5 9.3 9.2 9.3 9.1  MG  --  1.5* 1.4* 1.6* 1.8  PHOS  --  2.0* 2.2* 2.2* 2.6     Liver Function Tests: Recent Labs  Lab 03/30/19 0529 03/31/19 0619 04/01/19 0529 04/02/19 0432  ALBUMIN 3.1* 2.9* 2.6* 2.4*   No results for input(s): LIPASE, AMYLASE in the last 168 hours. No results for input(s): AMMONIA in the last 168 hours. Coagulation Profile: No results for input(s): INR, PROTIME in the last 168 hours. Cardiac Enzymes: No results for input(s): CKTOTAL, CKMB, CKMBINDEX, TROPONINI in the last 168 hours. BNP (last 3 results) No results for input(s): PROBNP in the last 8760 hours. CBG: Recent Labs  Lab 04/01/19 1710 04/01/19 2112 04/02/19 0541 04/02/19 1207 04/02/19 1607  GLUCAP 137* 172* 105* 150* 139*   Studies: Dg Wrist Complete Right  Result Date: 04/02/2019 CLINICAL DATA:  Generalized wrist pain, no known injury, initial encounter EXAM: RIGHT WRIST - COMPLETE 3+ VIEW COMPARISON:  None. FINDINGS: Mild degenerative changes are noted the first [facility] joint. Some calcifications are noted along the anterior aspect of the carpal bones. Mild generalized soft tissue swelling is noted without acute bony abnormality. IMPRESSION: Chronic changes without acute abnormality. Electronically Signed   By: Inez Catalina M.D.   On: 04/02/2019 15:13   Vas [place] Upper Extremity Venous Duplex  Result Date: 04/02/2019 UPPER VENOUS STUDY  Indications: Pain Limitations: Adduction of arm to side. Performing Technologist: June Leap RDMS, RVT  Examination Guidelines: A complete evaluation includes B-mode imaging, spectral Doppler, color Doppler, and power Doppler as needed of all accessible portions of each vessel. Bilateral testing is considered an integral part of a complete examination. Limited examinations for reoccurring indications may be performed as noted.  Right Findings: +----------+------------+---------+-----------+----------+--------------+ RIGHT     CompressiblePhasicitySpontaneousProperties   Summary      +----------+------------+---------+-----------+----------+--------------+ IJV           Full       Yes       Yes                             +----------+------------+---------+-----------+----------+--------------+ Subclavian    Full       Yes       Yes                             +----------+------------+---------+-----------+----------+--------------+ Axillary  Not visualized +----------+------------+---------+-----------+----------+--------------+ Brachial      Full       Yes       Yes                             +----------+------------+---------+-----------+----------+--------------+ Radial        Full                                                 +----------+------------+---------+-----------+----------+--------------+ Ulnar         Full                                                 +----------+------------+---------+-----------+----------+--------------+ Cephalic      Full                                                 +----------+------------+---------+-----------+----------+--------------+ Basilic                                             Not visualized +----------+------------+---------+-----------+----------+--------------+  Left Findings: +----------+------------+---------+-----------+----------+-------+ LEFT      CompressiblePhasicitySpontaneousPropertiesSummary +----------+------------+---------+-----------+----------+-------+ Subclavian               Yes       Yes                      +----------+------------+---------+-----------+----------+-------+  Summary:  Right: No evidence of deep vein thrombosis in the upper extremity. No evidence of superficial vein thrombosis in the upper extremity. Limited exam- see comments above.  Left: No evidence of thrombosis in the subclavian.  *See table(s) above for measurements and observations.    Preliminary    Time spent: 35 minutes  Author:  Berle Mull, MD [facility] 04/02/2019 5:40 PM  To reach On-call, see care teams to locate the attending and reach out to them via [email or web address]. If 7PM-7AM, please contact night-coverage If you still have difficulty reaching the attending provider, please page the [facility] (Director on Call) for [facility] on amion for assistance.

## 2019-04-03 LAB — GLUCOSE, CAPILLARY
Glucose-Capillary: 106 mg/dL — ABNORMAL HIGH (ref 70–99)
Glucose-Capillary: 109 mg/dL — ABNORMAL HIGH (ref 70–99)
Glucose-Capillary: 113 mg/dL — ABNORMAL HIGH (ref 70–99)
Glucose-Capillary: 113 mg/dL — ABNORMAL HIGH (ref 70–99)

## 2019-04-03 LAB — COMPREHENSIVE METABOLIC PANEL
ALT: 18 U/L (ref 0–44)
AST: 35 U/L (ref 15–41)
Albumin: 2.7 g/dL — ABNORMAL LOW (ref 3.5–5.0)
Alkaline Phosphatase: 57 U/L (ref 38–126)
Anion gap: 10 (ref 5–15)
BUN: 39 mg/dL — ABNORMAL HIGH (ref 8–23)
CO2: 22 mmol/L (ref 22–32)
Calcium: 9.4 mg/dL (ref 8.9–10.3)
Chloride: 101 mmol/L (ref 98–111)
Creatinine, Ser: 1.41 mg/dL — ABNORMAL HIGH (ref 0.44–1.00)
GFR calc Af Amer: 39 mL/min — ABNORMAL LOW (ref >60)
GFR calc non Af Amer: 34 mL/min — ABNORMAL LOW (ref >60)
Glucose, Bld: 105 mg/dL — ABNORMAL HIGH (ref 70–99)
Potassium: 4.8 mmol/L (ref 3.5–5.1)
Sodium: 133 mmol/L — ABNORMAL LOW (ref 135–145)
Total Bilirubin: 0.7 mg/dL (ref 0.3–1.2)
Total Protein: 6 g/dL — ABNORMAL LOW (ref 6.5–8.1)

## 2019-04-03 LAB — CBC
HCT: 28.3 % — ABNORMAL LOW (ref 36.0–46.0)
Hemoglobin: 8.8 g/dL — ABNORMAL LOW (ref 12.0–15.0)
MCH: 27.7 pg (ref 26.0–34.0)
MCHC: 31.1 g/dL (ref 30.0–36.0)
MCV: 89 fL (ref 80.0–100.0)
Platelets: 414 10*3/uL — ABNORMAL HIGH (ref 150–400)
RBC: 3.18 MIL/uL — ABNORMAL LOW (ref 3.87–5.11)
RDW: 14.2 % (ref 11.5–15.5)
WBC: 8.3 10*3/uL (ref 4.0–10.5)
nRBC: 0 % (ref 0.0–0.2)

## 2019-04-03 MED ORDER — GLUCERNA SHAKE PO LIQD
237.0000 mL | Freq: Three times a day (TID) | ORAL | Status: DC
  Administered 2019-04-03 – 2019-04-07 (×14): 237 mL via ORAL

## 2019-04-03 NOTE — Consult Note (Signed)
   [facility] CM Inpatient Consult   04/03/2019  Courtney Bradford Aug 08, 1931 [id number]  Following for high risk score for unplanned readmission score of 22% today and long length of stay with [employer] [facility].  Checking for potential [place] Management services as needed.  Review of patient's inpatient [facility] team notes that the patient is being recommended for a skilled nursing facility rehab stay per therapy.  Primary Care Provider is listed as Dr. Iona Beard, this office provides their transition of care follow up.  Plan: No current [facility] Care Management needs assessed.  Patient has personal care services when returning home from skilled facility, along with her son.   Please place a [facility] Care Management consult as appropriate and for questions contact:   Natividad Brood, RN BSN [facility] Liaison  [phone] business mobile phone Toll free office [phone]  Fax number: [phone] [email or web address]

## 2019-04-03 NOTE — Progress Notes (Signed)
Removed 8 sutures from finger.  Patient tolerated good.  A few areas were more painful than others where the skin had healed over them.   1 Suture we were unable to get completely on the lateral aspect of the digit.

## 2019-04-03 NOTE — Progress Notes (Signed)
[facility] Progress Note  Patient: Courtney Bradford BWG:[id number]   PCP: Iona Beard, MD DOB: [birth year]   DOA: 03/25/2019   DOS: 04/03/2019   Date of Service: the patient was seen and examined on 04/03/2019  Brief hospital course: Pt. with PMH of HTN, gout, type II DM, CKD 3, HFpEF, A. fib on Eliquis; admitted on 03/25/2019, presented with complaint of fever and confusion, was found to have sepsis secondary to E. coli and enterococcus bacteremia. Currently further plan is Continue current treatment for right wrist acute arthritis.  Subjective: Somewhat improvement in her right wrist pain.  No nausea no vomiting.  Minimal p.o. intake.  Assessment and Plan: 1. Principal Problem:   Acute encephalopathy-metabolic Sepsis secondary to UTI and bacteremia secondary to E. Coli as well as Klebsiella. Sepsis POA-now resolved Left hydroureteronephrosis Presents with confusion. CT of the head negative for any acute finding.  Show significant atrophy. Blood cultures positive for E. coli. Urine culture positive for E. coli as well as Klebsiella. CT abdomen pelvis on admission with contrast shows evidence of left-sided hydroureteronephrosis without any obstructing stone with small amount of gas in the bladder. No other acute abnormality. Prior provider discussed with ID, patient completed 6 days of IV antibiotics ceftriaxone followed by cefazolin based on sensitivity and currently on oral Augmentin. Plan to continue Augmentin last day 04/08/2019. Mentation improved but not back to baseline per family.  Anticipating improvement down the road as the patient leaves the hospital and then back to family requirement.  2.  Acute right wrist gout. Received colchicine 1.2 mg loading dose followed by 0.6 mg daily followed by currently 1.3 mg daily based on the worsening renal function. Family is aware of this medication and would like to continue this medication on a daily basis. Patient received prednisone for  3 days and currently off. Currently holding off on using it further as there is a concern for septic arthritis given her bacteremia. Appreciate orthopedic input. IR consulted for wrist aspiration.  Blood works ordered. ESR CRP significantly elevated. Eliquis on hold.  3.  Acute worsening of chronic kidney disease stage III. Patient's baseline serum creatinine is around 2 recently. Presented with creatinine of around 1.4.  Now trending over the way up to 1.9. Monitor for now. Recommend outpatient nephrology consultation. Patient and family would not want hemodialysis.  4. Essential hypertension   Chronic diastolic CHF (congestive heart failure) (HCC)   Atrial fibrillation, chronic (HCC)   Elevated troponin   Demand ischemia (HCC)   Sinus bradycardia   First degree AV block On Eliquis at home which we will hold for now Continue Norvasc 2.5 mg daily. Rate controlled  5. Type 2 diabetes, controlled, with renal manifestation (HCC) On 30 units of Humulin at home on a daily basis. Currently on sliding scale insulin. sugars well controlled.  6.  Hypomagnesemia Currently resolved.  7.  Anemia due to chronic kidney disease Hemoglobin stable. No active bleeding. Monitor.  8. Amputation of left index finger Performed in March 2020. Per family patient still has sutures there. Request orthopedic consultation. Sutures will be removed 04/02/2019.  Diet: Cardiac diet DVT Prophylaxis: Therapeutic Anticoagulation with Eliquis  Advance goals of care discussion: Full code-as per my discussion with patient's son they would not want hemodialysis.  Family Communication: no family was present at bedside, at the time of interview.  Discussed with patient's son on the phone on 04/03/2019   Disposition:  Discharge to [facility] likely Friday.  Consultants: Orthopedics, IR  Procedures: Joint aspiration  Scheduled Meds: . amLODipine  2.5 mg Oral Daily  . amoxicillin-clavulanate  1 tablet  Oral BID  . colchicine  0.3 mg Oral Daily  . feeding supplement (GLUCERNA SHAKE)  237 mL Oral TID BM  . Ferrous Fumarate  106 mg of iron Oral Q breakfast  . insulin aspart  0-5 Units Subcutaneous QHS  . insulin aspart  0-9 Units Subcutaneous TID WC  . olopatadine  1 drop Both Eyes BID   Continuous Infusions: . sodium chloride 250 mL (03/30/19 2059)   PRN Meds: sodium chloride, acetaminophen, hydrALAZINE, senna-docusate Antibiotics: Anti-infectives (From admission, onward)   Start     Dose/Rate Route Frequency Ordered Stop   03/31/19 1000  amoxicillin-clavulanate (AUGMENTIN) 875-125 MG per tablet 1 tablet  Status:  Discontinued     1 tablet Oral Every 12 hours 03/31/19 0919 03/31/19 0928   03/31/19 1000  amoxicillin-clavulanate (AUGMENTIN) 500-125 MG per tablet 500 mg     1 tablet Oral 2 times daily 03/31/19 0929     03/28/19 2200  ceFAZolin (ANCEF) IVPB 2g/100 mL premix  Status:  Discontinued     2 g 200 mL/hr over 30 Minutes Intravenous Every 12 hours 03/28/19 1231 03/31/19 0919   03/26/19 2200  cefTRIAXone (ROCEPHIN) 1 g in sodium chloride 0.9 % 100 mL IVPB  Status:  Discontinued     1 g 200 mL/hr over 30 Minutes Intravenous Every 24 hours 03/26/19 0217 03/26/19 1138   03/26/19 2200  cefTRIAXone (ROCEPHIN) 2 g in sodium chloride 0.9 % 100 mL IVPB  Status:  Discontinued     2 g 200 mL/hr over 30 Minutes Intravenous Every 24 hours 03/26/19 1138 03/28/19 1231   03/25/19 2200  cefTRIAXone (ROCEPHIN) 1 g in sodium chloride 0.9 % 100 mL IVPB     1 g 200 mL/hr over 30 Minutes Intravenous  Once 03/25/19 2151 03/25/19 2234       Objective: Physical Exam: Vitals:   04/02/19 2009 04/03/19 0459 04/03/19 0507 04/03/19 1205  BP:  (!) 151/77  136/80  Pulse:  65  69  Resp:  18  20  Temp: 98.2 F (36.8 C) 98.1 F (36.7 C)  98.5 F (36.9 C)  TempSrc: Oral Oral  Oral  SpO2:  100%  100%  Weight:   68.2 kg   Height:        Intake/Output Summary (Last 24 hours) at 04/03/2019 1941 Last  data filed at 04/03/2019 1859 Gross per 24 hour  Intake 1177 ml  Output 950 ml  Net 227 ml   Filed Weights   04/01/19 0423 04/02/19 0355 04/03/19 0507  Weight: 67.2 kg 68.7 kg 68.2 kg   General: alert and oriented to place and person. Appear in mild distress, affect flat in affect Eyes: PERRL, Conjunctiva normal ENT: Oral Mucosa Clear, moist  Neck: difficult to assess  JVD, no Abnormal Mass Or lumps Cardiovascular: S1 and S2 Present, no Murmur, peripheral pulses symmetrical and radial normal Respiratory: normal respiratory effort, Bilateral Air entry equal and Decreased, no use of accessory muscle, Clear to Auscultation, no Crackles, no wheezes Abdomen: Bowel Sound present, Soft and no tenderness, no hernia Skin: no rashes  Extremities: Right upper extremity significant swelling,  no pedal edema, no calf tenderness Neurologic: normal without focal findings, mental status, speech normal, alert and oriented x3, PERLA, Motor strength 5/5 and symmetric and sensation grossly normal Gait not checked due to patient safety concerns  Data Reviewed: CBC: Recent Labs  Lab 03/30/19  [id number] 03/31/19 5072 04/01/19 0529 04/02/19 0432 04/03/19 0640  WBC 8.9 12.8* 13.3* 10.4 8.3  NEUTROABS 6.7 10.2* 10.5* 7.2  --   HGB 8.9* 9.3* 8.7* 8.4* 8.8*  HCT 28.2* 28.8* 27.2* 26.1* 28.3*  MCV 87.9 87.3 87.2 87.3 89.0  PLT 208 228 269 335 257*   Basic Metabolic Panel: Recent Labs  Lab 03/30/19 0529 03/31/19 0619 04/01/19 0529 04/02/19 0432 04/03/19 0640  NA 138 135 135 133* 133*  K 4.2 4.0 4.3 4.8 4.8  CL 105 102 99 98 101  CO2 _0 GLUCOSE 116* 122* 117* 104* 105*  BUN 24* 18 24* 42* 39*  CREATININE 1.26* 1.23* 1.42* 1.91* 1.41*  CALCIUM 9.3 9.2 9.3 9.1 9.4  MG 1.5* 1.4* 1.6* 1.8  --   PHOS 2.0* 2.2* 2.2* 2.6  --     Liver Function Tests: Recent Labs  Lab 03/30/19 0529 03/31/19 0619 04/01/19 0529 04/02/19 0432 04/03/19 0640  AST  --   --   --   --  35  ALT  --   --   --    --  18  ALKPHOS  --   --   --   --  57  BILITOT  --   --   --   --  0.7  PROT  --   --   --   --  6.0*  ALBUMIN 3.1* 2.9* 2.6* 2.4* 2.7*   No results for input(s): LIPASE, AMYLASE in the last 168 hours. No results for input(s): AMMONIA in the last 168 hours. Coagulation Profile: No results for input(s): INR, PROTIME in the last 168 hours. Cardiac Enzymes: No results for input(s): CKTOTAL, CKMB, CKMBINDEX, TROPONINI in the last 168 hours. BNP (last 3 results) No results for input(s): PROBNP in the last 8760 hours. CBG: Recent Labs  Lab 04/02/19 2238 04/02/19 2309 04/03/19 0619 04/03/19 1206 04/03/19 1627  GLUCAP 126* 120* 106* 109* 113*   Studies: No results found. Time spent: 35 minutes  Author: Berle Mull, MD [facility] 04/03/2019 7:41 PM  To reach On-call, see care teams to locate the attending and reach out to them via [email or web address]. If 7PM-7AM, please contact night-coverage If you still have difficulty reaching the attending provider, please page the [facility] (Director on Call) for [facility] on amion for assistance.

## 2019-04-03 NOTE — Progress Notes (Signed)
Physical Therapy Treatment Patient Details Name: Courtney Bradford MRN: [id number] DOB: February 10, 1931 Today's Date: 04/03/2019    History of Present Illness Pt is an 85 y/o female admitted secondary to AMS. Found to have encephalopathy likely secondary to UTI. CT of head was negative for acute abnormality. Pt with elevated troponin, however, per notes, likely secondary to demand ischemia. PMH includes HTN, dHCF, a fib, DM, CKD, HTN, and gout.     PT Comments    Pt pleasant with increased command following and awareness this session. Pt able to get to EOB and stand with decreased assist. Mobility limited by urinary incontinence in standing but pt stating inability to walk at this time. Pt educated for HEP and continued progression of mobility. D/c plan remains appropriate.    Follow Up Recommendations  SNF;Supervision/Assistance - 24 hour     Equipment Recommendations  None recommended by PT    Recommendations for Other Services       Precautions / Restrictions Precautions Precautions: Fall Precaution Comments: RUE gout    Mobility  Bed Mobility Overal bed mobility: Needs Assistance Bed Mobility: Supine to Sit;Sit to Supine     Supine to sit: Min assist     General bed mobility comments: cues for placing RUE on abdomen with assist to pivot to EOB and elevate trunk, increased time and multimodal cues  Transfers Overall transfer level: Needs assistance   Transfers: Sit to/from Stand;Stand Pivot Transfers Sit to Stand: Mod assist;+2 safety/equipment Stand pivot transfers: +2 safety/equipment;Mod assist       General transfer comment: pt able to stand x 2 trials with right knee blocked and bil axillary support due to RUE pain. pt able to clear sacrum x 2 trials but maintains crouched posture. Pt incontinent of urine in standing and stood grossly 30 sec each trial for pericare and linen change. Pt transferred bed to chair with Left axillary support and pt reaching for  environmental support with RUE with cues. Improved transfer from prior session  Ambulation/Gait             General Gait Details: not yet able to attempt today   Stairs             Wheelchair Mobility    Modified Rankin (Stroke Patients Only)       Balance Overall balance assessment: Needs assistance Sitting-balance support: Feet supported;No upper extremity supported Sitting balance-Leahy Scale: Fair Sitting balance - Comments: EOB with supervision. Grossly 6 min for linen change and washing without LOB   Standing balance support: Single extremity supported Standing balance-Leahy Scale: Poor Standing balance comment: reliant on UE support in standing                            Cognition Arousal/Alertness: Awake/alert Behavior During Therapy: Flat affect Overall Cognitive Status: Impaired/Different from baseline Area of Impairment: Orientation;Memory;Following commands;Problem solving                 Orientation Level: Disoriented to;Time;Situation   Memory: Decreased recall of precautions;Decreased short-term memory Following Commands: Follows one step commands with increased time     Problem Solving: Slow processing;Decreased initiation;Difficulty sequencing;Requires verbal cues;Requires tactile cues General Comments: pt with improved awareness and delayed command following but improved from last session      Exercises General Exercises - Lower Extremity Long Arc Quad: 15 reps;Seated;Both Hip ABduction/ADduction: AAROM;15 reps;Seated;Both Hip Flexion/Marching: AAROM;15 reps;Seated;Both    General Comments  Pertinent Vitals/Pain Pain Score: 4  Pain Location: RUe with movement or touch Pain Descriptors / Indicators: Aching;Constant Pain Intervention(s): Limited activity within patient's tolerance;Monitored during session;Repositioned    Home Living                      Prior Function            PT Goals  (current goals can now be found in the care plan section) Progress towards PT goals: Progressing toward goals    Frequency           PT Plan Current plan remains appropriate    Co-evaluation              AM-PAC PT "6 Clicks" Mobility   Outcome Measure  Help needed turning from your back to your side while in a flat bed without using bedrails?: A Little Help needed moving from lying on your back to sitting on the side of a flat bed without using bedrails?: A Little Help needed moving to and from a bed to a chair (including a wheelchair)?: A Lot Help needed standing up from a chair using your arms (e.g., wheelchair or bedside chair)?: A Lot Help needed to walk in hospital room?: Total Help needed climbing 3-5 steps with a railing? : Total 6 Click Score: 12    End of Session Equipment Utilized During Treatment: Gait belt Activity Tolerance: Patient tolerated treatment well Patient left: in chair;with call bell/phone within reach;with chair alarm set Nurse Communication: Mobility status;Precautions PT Visit Diagnosis: Other abnormalities of gait and mobility (R26.89);Muscle weakness (generalized) (M62.81);Unsteadiness on feet (R26.81)     Time: 6754-4920 PT Time Calculation (min) (ACUTE ONLY): 23 min  Charges:  $Therapeutic Exercise: 8-22 mins $Therapeutic Activity: 8-22 mins                     [place], PT Acute Rehabilitation Services Pager: [phone] Office: [place] 04/03/2019, 1:54 PM

## 2019-04-03 NOTE — Evaluation (Signed)
Occupational Therapy Re-Evaluation Patient Details Name: Courtney Bradford MRN: [id number] DOB: June 04, 1931 Today's Date: 04/03/2019    History of Present Illness Pt is an 85 y/o female admitted secondary to AMS. Found to have encephalopathy likely secondary to UTI. CT of head was negative for acute abnormality. Pt with elevated troponin, however, per notes, likely secondary to demand ischemia. PMH includes HTN, dHCF, a fib, DM, CKD, HTN, and gout.    Clinical Impression   Pt re-evaluation for OT from this admission due to change in cognitive status and functional mobility/transfers. Pt also being impacted by gout flare up in RUE - which is her dominant hand and impacts her ability to feed herself and perform BUE tasks during grooming and support during transfers. Pt declined back to bed but was mod A +2 to re-adjust in the chair (Pt unable to achieve full upright). Max A for LB ADL and mod A for UB ADL at this time. Pt will require skilled OT in the acute setting as well as afterwards at the SNF level to maximize safety and independence in ADL and functional transfers (she has caregivers that assist her with all these but was transferring much better and could feed/groom herself).    Follow Up Recommendations  SNF;Supervision/Assistance - 24 hour    Equipment Recommendations  None recommended by OT(Pt has appropriate DME)    Recommendations for Other Services       Precautions / Restrictions Precautions Precautions: Fall Precaution Comments: RUE gout Restrictions Weight Bearing Restrictions: Yes RUE Weight Bearing: Non weight bearing      Mobility Bed Mobility Overal bed mobility: Needs Assistance Bed Mobility: Supine to Sit;Sit to Supine     Supine to sit: Min assist     General bed mobility comments: Pt OOB in recliner at beginning and end of session  Transfers Overall transfer level: Needs assistance Equipment used: Rolling walker (2 wheeled) Transfers: Sit to/from  Stand Sit to Stand: Mod assist;+2 safety/equipment Stand pivot transfers: +2 safety/equipment;Mod assist       General transfer comment: Pt only willing to scoot back in the chair (found sitting on the leg support of the recliner) Pt able to assist with scooting back in the chair with mod A +2 but did not achieve full extension, extra support for RUE    Balance Overall balance assessment: Needs assistance Sitting-balance support: Feet supported;No upper extremity supported Sitting balance-Leahy Scale: Fair Sitting balance - Comments: EOB with supervision. Grossly 6 min for linen change and washing without LOB   Standing balance support: Single extremity supported Standing balance-Leahy Scale: Poor Standing balance comment: reliant on UE support in standing                           ADL either performed or assessed with clinical judgement   ADL Overall ADL's : Needs assistance/impaired Eating/Feeding: Modified independent;Sitting   Grooming: Set up;Sitting Grooming Details (indicate cue type and reason): fatigues quickly Upper Body Bathing: Moderate assistance;Sitting   Lower Body Bathing: Maximal assistance;Sitting/lateral leans Lower Body Bathing Details (indicate cue type and reason): typically bathed by caregiver Upper Body Dressing : Moderate assistance   Lower Body Dressing: Maximal assistance   Toilet Transfer: Moderate assistance;+2 for physical assistance;+2 for safety/equipment;Stand-pivot;BSC   Toileting- Clothing Manipulation and Hygiene: Maximal assistance;+2 for physical assistance;+2 for safety/equipment Toileting - Clothing Manipulation Details (indicate cue type and reason): or at bed level     Functional mobility during ADLs: Moderate assistance;+2 for  physical assistance;+2 for safety/equipment;Rolling walker General ADL Comments: Pt has caregivers for ADLs 7 days/wk at home     Vision Patient Visual Report: No change from baseline        Perception     Praxis      Pertinent Vitals/Pain Pain Assessment: 0-10 Pain Score: 4  Pain Location: RUe with movement or touch Pain Descriptors / Indicators: Aching;Constant Pain Intervention(s): Limited activity within patient's tolerance;Monitored during session;Repositioned     Hand Dominance Right   Extremity/Trunk Assessment Upper Extremity Assessment Upper Extremity Assessment: RUE deficits/detail RUE Deficits / Details: limited by painful gout. grasp 3/5, wrist too painful, elbow too painful - even with gentle PROM RUE: Unable to fully assess due to pain RUE Sensation: (hypersensitivity) RUE Coordination: decreased fine motor;decreased gross motor   Lower Extremity Assessment Lower Extremity Assessment: Defer to PT evaluation   Cervical / Trunk Assessment Cervical / Trunk Assessment: Kyphotic   Communication Communication Communication: No difficulties   Cognition Arousal/Alertness: Awake/alert Behavior During Therapy: Flat affect Overall Cognitive Status: Impaired/Different from baseline Area of Impairment: Orientation;Memory;Following commands;Problem solving                 Orientation Level: Disoriented to;Time;Situation   Memory: Decreased recall of precautions;Decreased short-term memory Following Commands: Follows one step commands with increased time     Problem Solving: Slow processing;Decreased initiation;Difficulty sequencing;Requires verbal cues;Requires tactile cues General Comments: Pt required cues for functional tasks and delayed processing   General Comments       Exercises General Exercises - Lower Extremity Long Arc Quad: 15 reps;Seated;Both Hip ABduction/ADduction: AAROM;15 reps;Seated;Both Hip Flexion/Marching: AAROM;15 reps;Seated;Both   Shoulder Instructions      Home Living Family/patient expects to be discharged to:: Private residence Living Arrangements: Children Available Help at Discharge: Family;Personal care  attendant Type of Home: House Home Access: Stairs to enter [employer] of Steps: 1 Entrance Stairs-Rails: None Home Layout: One level     Bathroom Shower/Tub: (walk in tub)   Bathroom Toilet: Standard     Home Equipment: Environmental consultant - 2 wheels;Cane - single point   Additional Comments: Pt reports she lives with her son and has a caretaker all day in the daytime.       Prior Functioning/Environment Level of Independence: Needs assistance  Gait / Transfers Assistance Needed: Used RW for mobility and aide would assist with mobility tasks.  ADL's / Homemaking Assistance Needed: Reports aide assisted with ADLs            OT Problem List: Decreased activity tolerance;Decreased cognition;Decreased knowledge of use of DME or AE;Impaired balance (sitting and/or standing);Decreased knowledge of precautions;Pain;Impaired UE functional use;Decreased range of motion;Decreased strength      OT Treatment/Interventions: Self-care/ADL training;Energy conservation;DME and/or AE instruction;Therapeutic activities;Balance training;Patient/family education    OT Goals(Current goals can be found in the care plan section) Acute Rehab OT Goals Patient Stated Goal: stay here in this chair OT Goal Formulation: With patient Time For Goal Achievement: 04/17/19 Potential to Achieve Goals: Good ADL Goals Pt Will Perform Eating: with modified independence;sitting Pt Will Perform Grooming: sitting;with set-up Pt Will Transfer to Toilet: with mod assist;stand pivot transfer;bedside commode Pt Will Perform Toileting - Clothing Manipulation and hygiene: with mod assist;sit to/from stand Additional ADL Goal #1: PT will perform bed mobility at min A level prior to engaging in ADL activity  OT Frequency: Min 2X/week   Barriers to D/C:            Co-evaluation  AM-PAC OT "6 Clicks" Daily Activity     Outcome Measure Help from another person eating meals?: A Little Help from  another person taking care of personal grooming?: A Little Help from another person toileting, which includes using toliet, bedpan, or urinal?: A Lot Help from another person bathing (including washing, rinsing, drying)?: A Lot Help from another person to put on and taking off regular upper body clothing?: A Lot Help from another person to put on and taking off regular lower body clothing?: A Lot 6 Click Score: 14   End of Session Nurse Communication: Mobility status;Precautions  Activity Tolerance: Patient limited by pain Patient left: in chair;with call bell/phone within reach;with chair alarm set  OT Visit Diagnosis: Unsteadiness on feet (R26.81);Other abnormalities of gait and mobility (R26.89);Muscle weakness (generalized) (M62.81);Other symptoms and signs involving cognitive function;Pain Pain - Right/Left: Right Pain - part of body: Arm                Time: 0305-0322 OT Time Calculation (min): 17 min Charges:  OT General Charges $OT Visit: 1 Visit OT Evaluation $OT Re-eval: [place] OTR/L Acute Rehabilitation Services Pager: [phone] Office: [place] 04/03/2019, 3:44 PM

## 2019-04-04 LAB — GLUCOSE, CAPILLARY
Glucose-Capillary: 97 mg/dL (ref 70–99)
Glucose-Capillary: 92 mg/dL (ref 70–99)
Glucose-Capillary: 111 mg/dL — ABNORMAL HIGH (ref 70–99)
Glucose-Capillary: 134 mg/dL — ABNORMAL HIGH (ref 70–99)

## 2019-04-04 LAB — BASIC METABOLIC PANEL
Anion gap: 11 (ref 5–15)
BUN: 47 mg/dL — ABNORMAL HIGH (ref 8–23)
CO2: 22 mmol/L (ref 22–32)
Calcium: 9.3 mg/dL (ref 8.9–10.3)
Chloride: 101 mmol/L (ref 98–111)
Creatinine, Ser: 1.66 mg/dL — ABNORMAL HIGH (ref 0.44–1.00)
GFR calc Af Amer: 32 mL/min — ABNORMAL LOW (ref >60)
GFR calc non Af Amer: 28 mL/min — ABNORMAL LOW (ref >60)
Glucose, Bld: 121 mg/dL — ABNORMAL HIGH (ref 70–99)
Potassium: 5 mmol/L (ref 3.5–5.1)
Sodium: 134 mmol/L — ABNORMAL LOW (ref 135–145)

## 2019-04-04 LAB — MAGNESIUM: Magnesium: 1.9 mg/dL (ref 1.7–2.4)

## 2019-04-04 MED ORDER — CEFAZOLIN SODIUM-DEXTROSE 2-4 GM/100ML-% IV SOLN
2.0000 g | Freq: Two times a day (BID) | INTRAVENOUS | Status: DC
  Administered 2019-04-04 – 2019-04-07 (×6): 2 g via INTRAVENOUS
  Filled 2019-04-04 (×6): qty 100

## 2019-04-04 NOTE — Progress Notes (Signed)
Pharmacy Antibiotic Note  Courtney Bradford is a 85 y.o. female admitted on 03/25/2019 with fever and AMS.  Patient was started on Rocephin for E.coli + Kleb pneumo UTI/urosepsis and E.coli bacteremia, then narrowed to Ancef and then to Augmentin.  Now to get wrist aspiration, so Pharmacy has been consulted to switch patient back to Ancef.  Renal function is worsening - SCr up to 1.66, CrCL 23 ml/min, afebrile, WBC WNL.  Plan: Ancef 2gm IV Q12H Monitor renal fxn, micro data  Height: 5\' 6"  (167.6 cm) Weight: 147 lb 11.3 oz (67 kg) IBW/kg (Calculated) : 59.3  Temp (24hrs), Avg:98.2 F (36.8 C), Min:97.8 F (36.6 C), Max:98.5 F (36.9 C)  Recent Labs  Lab 03/30/19 0529 03/31/19 0619 04/01/19 0529 04/02/19 0432 04/03/19 0640 04/04/19 0423  WBC 8.9 12.8* 13.3* 10.4 8.3  --   CREATININE 1.26* 1.23* 1.42* 1.91* 1.41* 1.66*    Estimated Creatinine Clearance: 23.2 mL/min (A) (by C-G formula based on SCr of 1.66 mg/dL (H)).    Allergies  Allergen Reactions  . Lactose Intolerance (Gi) Other (See Comments)    Upset stomach    Ceftriaxone 5/27>>5/29 Cefazolin 5/29>>6/1, restarted 6/5 >> Augmentin 6/1>> 6/5  5/26 blood x 2 - 1 of 2 with E coli, pansensitive 5/27 BCID - E coli 5/26 urine - >100K/ml E coli (R cipro, amp, S CTX, Cefazolin, Unasyn )and Klebsiella (R amp, S CTX, cefazolin, unasyn) 5/26 COVID: negative  Chioma Mukherjee D. Mina Marble, PharmD, BCPS, [place] 04/04/2019, 4:52 PM

## 2019-04-04 NOTE — TOC Benefit Eligibility Note (Signed)
Transition of Care [facility]) Benefit Eligibility Note    Patient Details  Name: Courtney Bradford MRN: [id number] Date of Birth: Feb 27, 1931                                [name] Phone Number: 04/04/2019, 12:21 PM

## 2019-04-04 NOTE — Progress Notes (Signed)
[facility] Progress Note  Patient: Courtney Bradford:[id number]   PCP: Iona Beard, MD DOB: [birth year]   DOA: 03/25/2019   DOS: 04/04/2019   Date of Service: the patient was seen and examined on 04/04/2019  Brief hospital course: Pt. with PMH of HTN, gout, type II DM, CKD 3, HFpEF, A. fib on Eliquis; admitted on 03/25/2019, presented with complaint of fever and confusion, was found to have sepsis secondary to E. coli and enterococcus bacteremia. Currently further plan is Continue current treatment for right wrist acute arthritis.  Subjective: right wrist pain same.  No nausea no vomiting.  Improvement in p.o. intake.  Assessment and Plan: 1. Principal Problem: Acute encephalopathy-metabolic Sepsis secondary to UTI and bacteremia secondary to E. Coli as well as Klebsiella. Sepsis POA-now resolved Left hydroureteronephrosis  Presents with confusion. CT of the head negative for any acute finding.  Show significant atrophy. Blood cultures positive for E. coli. Urine culture positive for E. coli as well as Klebsiella. CT abdomen pelvis on admission with contrast shows evidence of left-sided hydroureteronephrosis without any obstructing stone with small amount of gas in the bladder. No other acute abnormality. Prior provider discussed with ID, patient completed 6 days of IV antibiotics ceftriaxone followed by cefazolin based on sensitivity and currently on oral Augmentin. Plan to continue Antibiotics last day 04/08/2019. Mentation improved but not back to baseline per family.  Anticipating improvement down the road as the patient leaves the hospital and then back to family requirement.  2.  Acute right wrist gout. ?septic arthritis  Received colchicine 1.2 mg loading dose followed by 0.6 mg daily followed by currently 0.3 mg daily based on the worsening renal function. Family is aware of this medication and would like to continue this medication on a daily basis. Patient received  prednisone for 3 days and currently off. Currently holding off on using it further as there is a concern for septic arthritis given her bacteremia. Appreciate orthopedic input. IR consulted for wrist aspiration.  labwork ordered. ESR CRP significantly elevated. Eliquis on hold.  3.  Acute worsening of chronic kidney disease stage III. Patient's baseline serum creatinine is around 2 recently. Presented with creatinine of around 1.4. Monitor for now. Recommend outpatient nephrology consultation. Patient and family would not want hemodialysis.  4. Essential hypertension   Chronic diastolic CHF (congestive heart failure) (HCC)   Atrial fibrillation, chronic (HCC)   Elevated troponin   Demand ischemia (HCC)   Sinus bradycardia   First degree AV block On Eliquis at home which we will hold for now Continue Norvasc 2.5 mg daily. Rate controlled  5. Type 2 diabetes, controlled, with renal manifestation (HCC) On 30 units of Humulin at home on a daily basis. Currently on sliding scale insulin. sugars well controlled.  6.  Hypomagnesemia Currently resolved.  7.  Anemia due to chronic kidney disease Hemoglobin stable. No active bleeding. Monitor.  8. Amputation of left index finger Performed in March 2020. Per family patient still has sutures there. Requested orthopedic consultation. Sutures removed 04/02/2019.  Diet: Cardiac diet DVT Prophylaxis: Therapeutic Anticoagulation with Eliquis  Advance goals of care discussion: Full code-as per my discussion with patient's son they would not want hemodialysis.  Family Communication: no family was present at bedside, at the time of interview.  Discussed with patient's son on the phone on 04/04/2019   Disposition:  Discharge to [place] [place] likely Friday.  Consultants: Orthopedics, IR Procedures: Joint aspiration  Scheduled Meds: . amLODipine  2.5 mg Oral  Daily  . colchicine  0.3 mg Oral Daily  . feeding supplement (GLUCERNA  SHAKE)  237 mL Oral TID BM  . Ferrous Fumarate  106 mg of iron Oral Q breakfast  . insulin aspart  0-5 Units Subcutaneous QHS  . insulin aspart  0-9 Units Subcutaneous TID WC  . olopatadine  1 drop Both Eyes BID   Continuous Infusions: . sodium chloride 250 mL (03/30/19 2059)  .  ceFAZolin (ANCEF) IV     PRN Meds: sodium chloride, acetaminophen, hydrALAZINE, senna-docusate Antibiotics: Anti-infectives (From admission, onward)   Start     Dose/Rate Route Frequency Ordered Stop   04/04/19 2200  ceFAZolin (ANCEF) IVPB 2g/100 mL premix     2 g 200 mL/hr over 30 Minutes Intravenous Every 12 hours 04/04/19 1656     03/31/19 1000  amoxicillin-clavulanate (AUGMENTIN) 875-125 MG per tablet 1 tablet  Status:  Discontinued     1 tablet Oral Every 12 hours 03/31/19 0919 03/31/19 0928   03/31/19 1000  amoxicillin-clavulanate (AUGMENTIN) 500-125 MG per tablet 500 mg  Status:  Discontinued     1 tablet Oral 2 times daily 03/31/19 0929 04/04/19 1656   03/28/19 2200  ceFAZolin (ANCEF) IVPB 2g/100 mL premix  Status:  Discontinued     2 g 200 mL/hr over 30 Minutes Intravenous Every 12 hours 03/28/19 1231 03/31/19 0919   03/26/19 2200  cefTRIAXone (ROCEPHIN) 1 g in sodium chloride 0.9 % 100 mL IVPB  Status:  Discontinued     1 g 200 mL/hr over 30 Minutes Intravenous Every 24 hours 03/26/19 0217 03/26/19 1138   03/26/19 2200  cefTRIAXone (ROCEPHIN) 2 g in sodium chloride 0.9 % 100 mL IVPB  Status:  Discontinued     2 g 200 mL/hr over 30 Minutes Intravenous Every 24 hours 03/26/19 1138 03/28/19 1231   03/25/19 2200  cefTRIAXone (ROCEPHIN) 1 g in sodium chloride 0.9 % 100 mL IVPB     1 g 200 mL/hr over 30 Minutes Intravenous  Once 03/25/19 2151 03/25/19 2234       Objective: Physical Exam: Vitals:   04/04/19 0615 04/04/19 0616 04/04/19 1007 04/04/19 1148  BP:  (!) 160/78 (!) 154/60 (!) 168/72  Pulse:  63 60 69  Resp:    20  Temp:  97.8 F (36.6 C)  98.5 F (36.9 C)  TempSrc:  Oral  Oral   SpO2:  100% 100% 100%  Weight: 67 kg     Height:        Intake/Output Summary (Last 24 hours) at 04/04/2019 1959 Last data filed at 04/04/2019 1841 Gross per 24 hour  Intake 662 ml  Output 1000 ml  Net -338 ml   Filed Weights   04/02/19 0355 04/03/19 0507 04/04/19 0615  Weight: 68.7 kg 68.2 kg 67 kg   General: alert and oriented to place and person. Appear in mild distress, affect flat in affect Eyes: PERRL, Conjunctiva normal ENT: Oral Mucosa Clear, moist  Neck: difficult to assess  JVD, no Abnormal Mass Or lumps Cardiovascular: S1 and S2 Present, no Murmur, peripheral pulses symmetrical and radial normal Respiratory: normal respiratory effort, Bilateral Air entry equal and Decreased, no use of accessory muscle, Clear to Auscultation, no Crackles, no wheezes Abdomen: Bowel Sound present, Soft and no tenderness, no hernia Skin: no rashes  Extremities: Right upper extremity significant swelling,  no pedal edema, no calf tenderness Neurologic: normal without focal findings, mental status, speech normal, alert and oriented x3, PERLA, Motor strength 5/5 and symmetric  and sensation grossly normal Gait not checked due to patient safety concerns  Data Reviewed: CBC: Recent Labs  Lab 03/30/19 0529 03/31/19 0619 04/01/19 0529 04/02/19 0432 04/03/19 0640  WBC 8.9 12.8* 13.3* 10.4 8.3  NEUTROABS 6.7 10.2* 10.5* 7.2  --   HGB 8.9* 9.3* 8.7* 8.4* 8.8*  HCT 28.2* 28.8* 27.2* 26.1* 28.3*  MCV 87.9 87.3 87.2 87.3 89.0  PLT 208 228 269 335 811*   Basic Metabolic Panel: Recent Labs  Lab 03/30/19 0529 03/31/19 0619 04/01/19 0529 04/02/19 0432 04/03/19 0640 04/04/19 0423  NA 138 135 135 133* 133* 134*  K 4.2 4.0 4.3 4.8 4.8 5.0  CL 105 102 99 98 101 101  CO2 _0 GLUCOSE 116* 122* 117* 104* 105* 121*  BUN 24* 18 24* 42* 39* 47*  CREATININE 1.26* 1.23* 1.42* 1.91* 1.41* 1.66*  CALCIUM 9.3 9.2 9.3 9.1 9.4 9.3  MG 1.5* 1.4* 1.6* 1.8  --  1.9  PHOS 2.0* 2.2* 2.2* 2.6   --   --     Liver Function Tests: Recent Labs  Lab 03/30/19 0529 03/31/19 0619 04/01/19 0529 04/02/19 0432 04/03/19 0640  AST  --   --   --   --  35  ALT  --   --   --   --  18  ALKPHOS  --   --   --   --  57  BILITOT  --   --   --   --  0.7  PROT  --   --   --   --  6.0*  ALBUMIN 3.1* 2.9* 2.6* 2.4* 2.7*   No results for input(s): LIPASE, AMYLASE in the last 168 hours. No results for input(s): AMMONIA in the last 168 hours. Coagulation Profile: No results for input(s): INR, PROTIME in the last 168 hours. Cardiac Enzymes: No results for input(s): CKTOTAL, CKMB, CKMBINDEX, TROPONINI in the last 168 hours. BNP (last 3 results) No results for input(s): PROBNP in the last 8760 hours. CBG: Recent Labs  Lab 04/03/19 1627 04/03/19 2126 04/04/19 0651 04/04/19 1150 04/04/19 1615  GLUCAP 113* 113* 97 92 111*   Studies: No results found. Time spent: 35 minutes  Author: Berle Mull, MD [facility] 04/04/2019 7:59 PM  To reach On-call, see care teams to locate the attending and reach out to them via [email or web address]. If 7PM-7AM, please contact night-coverage If you still have difficulty reaching the attending provider, please page the [facility] (Director on Call) for [facility] on amion for assistance.

## 2019-04-04 NOTE — TOC Progression Note (Signed)
Transition of Care [facility]) - Progression Note    Patient Details  Name: Courtney Bradford MRN: [id number] Date of Birth: [birth year]  Transition of [facility]) CM/SW [place], LCSW Phone Number: 04/04/2019, 4:13 PM  Clinical Narrative: Left SNF admissions coordinator a voicemail letting her know that patient would be here over the weekend.  Expected Discharge Plan: Home/Self Care Barriers to Discharge: No Barriers Identified  Expected Discharge Plan and Services Expected Discharge Plan: Home/Self Care In-house Referral: NA Discharge Planning Services: CM Consult Post Acute Care Choice: NA Living arrangements for the past 2 months: Single Family Home                 DME Arranged: N/A DME Agency: NA                   Social Determinants of Health (SDOH) Interventions    Readmission Risk Interventions No flowsheet data found.

## 2019-04-04 NOTE — Care Management Important Message (Signed)
Important Message  Patient Details  Name: Courtney Bradford MRN: [id number] Date of Birth: 1931/06/16   Medicare Important Message Given:  Yes    [name] 04/04/2019, 12:24 PM

## 2019-04-05 ENCOUNTER — Inpatient Hospital Stay (HOSPITAL_COMMUNITY): Payer: Medicare Other

## 2019-04-05 ENCOUNTER — Encounter (HOSPITAL_COMMUNITY): Payer: Self-pay | Admitting: Interventional Radiology

## 2019-04-05 HISTORY — PX: IR FLUORO GUIDED NEEDLE PLC ASPIRATION/INJECTION LOC: IMG2395

## 2019-04-05 LAB — BASIC METABOLIC PANEL
Anion gap: 10 (ref 5–15)
BUN: 47 mg/dL — ABNORMAL HIGH (ref 8–23)
CO2: 24 mmol/L (ref 22–32)
Calcium: 9 mg/dL (ref 8.9–10.3)
Chloride: 104 mmol/L (ref 98–111)
Creatinine, Ser: 1.63 mg/dL — ABNORMAL HIGH (ref 0.44–1.00)
GFR calc Af Amer: 33 mL/min — ABNORMAL LOW (ref >60)
GFR calc non Af Amer: 28 mL/min — ABNORMAL LOW (ref >60)
Glucose, Bld: 104 mg/dL — ABNORMAL HIGH (ref 70–99)
Potassium: 5.1 mmol/L (ref 3.5–5.1)
Sodium: 138 mmol/L (ref 135–145)

## 2019-04-05 LAB — GLUCOSE, CAPILLARY
Glucose-Capillary: 117 mg/dL — ABNORMAL HIGH (ref 70–99)
Glucose-Capillary: 104 mg/dL — ABNORMAL HIGH (ref 70–99)
Glucose-Capillary: 122 mg/dL — ABNORMAL HIGH (ref 70–99)
Glucose-Capillary: 101 mg/dL — ABNORMAL HIGH (ref 70–99)

## 2019-04-05 LAB — SYNOVIAL CELL COUNT + DIFF, W/ CRYSTALS
Crystals, Fluid: NONE SEEN
Eosinophils-Synovial: 2 % — ABNORMAL HIGH (ref 0–1)
Lymphocytes-Synovial Fld: 13 % (ref 0–20)
Monocyte-Macrophage-Synovial Fluid: 1 % — ABNORMAL LOW (ref 50–90)
Neutrophil, Synovial: 84 % — ABNORMAL HIGH (ref 0–25)
WBC, Synovial: UNDETERMINED /mm3 (ref 0–200)

## 2019-04-05 MED ORDER — LIDOCAINE HCL 1 % IJ SOLN
INTRAMUSCULAR | Status: AC
  Filled 2019-04-05: qty 20

## 2019-04-05 MED ORDER — SODIUM ZIRCONIUM CYCLOSILICATE 5 G PO PACK
5.0000 g | PACK | Freq: Once | ORAL | Status: AC
  Administered 2019-04-05: 5 g via ORAL
  Filled 2019-04-05: qty 1

## 2019-04-05 NOTE — Plan of Care (Signed)
  Problem: Safety: Goal: Ability to remain free from injury will improve Outcome: Progressing   

## 2019-04-05 NOTE — Progress Notes (Signed)
[facility] Progress Note  Patient: Courtney Bradford:[id number]   PCP: Iona Beard, MD DOB: Nov 05, 1930   DOA: 03/25/2019   DOS: 04/05/2019   Date of Service: the patient was seen and examined on 04/05/2019  Brief hospital course: Pt. with PMH of HTN, gout, type II DM, CKD 3, HFpEF, A. fib on Eliquis; admitted on 03/25/2019, presented with complaint of fever and confusion, was found to have sepsis secondary to E. coli and enterococcus bacteremia. Currently further plan is Continue current treatment for right wrist acute arthritis.  Subjective: right wrist pain improving.  No nausea no vomiting no fever no chills.  No chest pain abdominal pain.  Assessment and Plan: 1. Principal Problem: Acute encephalopathy-metabolic Sepsis secondary to UTI and bacteremia secondary to E. Coli as well as Klebsiella. Sepsis POA-now resolved Left hydroureteronephrosis  Presents with confusion. CT of the head negative for any acute finding.  Show significant atrophy. Blood cultures positive for E. coli. Urine culture positive for E. coli as well as Klebsiella. CT abdomen pelvis on admission with contrast shows evidence of left-sided hydroureteronephrosis without any obstructing stone with small amount of gas in the bladder. No other acute abnormality. Prior provider discussed with ID, patient completed 6 days of IV antibiotics ceftriaxone followed by cefazolin based on sensitivity and currently on oral Augmentin. Plan to continue Antibiotics last day 04/08/2019. Mentation improved but not back to baseline per family.  Anticipating improvement down the road as the patient leaves the hospital and then back to family requirement.  2.  Acute right wrist gout. ?septic arthritis  Received colchicine 1.2 mg loading dose followed by 0.6 mg daily followed by currently 0.3 mg daily based on the worsening renal function. Family is aware of this medication and would like to continue this medication on a daily basis.  Patient received prednisone for 3 days and currently off. Currently holding off on using it further as there is a concern for septic arthritis given her bacteremia. Appreciate orthopedic input. IR consulted for wrist aspiration.  No crystals seen on the aspirate.  Concerning given the presence of severe arthritis. Will await cultures.  Continue IV ceftriaxone. ESR CRP significantly elevated. Eliquis on hold.  3.  Acute worsening of chronic kidney disease stage III. Patient's baseline serum creatinine is around 2 recently. Presented with creatinine of around 1.4. Monitor for now. Recommend outpatient nephrology consultation. Patient and family would not want hemodialysis.  4. Essential hypertension   Chronic diastolic CHF (congestive heart failure) (HCC)   Atrial fibrillation, chronic (HCC)   Elevated troponin   Demand ischemia (HCC)   Sinus bradycardia   First degree AV block On Eliquis at home which we will hold for now Continue Norvasc 2.5 mg daily. Rate controlled  5. Type 2 diabetes, controlled, with renal manifestation (HCC) On 30 units of Humulin at home on a daily basis. Currently on sliding scale insulin. sugars well controlled.  6.  Hypomagnesemia Currently resolved.  7.  Anemia due to chronic kidney disease Hemoglobin stable. No active bleeding. Monitor.  8. Amputation of left index finger Performed in March 2020. Per family patient still has sutures there. Requested orthopedic consultation. Sutures removed 04/02/2019.  9.  Mild hyperkalemia. We will give 1 dose of Lokelma.  Change to low potassium diet.  Diet: Cardiac diet DVT Prophylaxis: Therapeutic Anticoagulation with Eliquis  Advance goals of care discussion: Full code-as per my discussion with patient's son they would not want hemodialysis.  Family Communication: no family was present at bedside,  at the time of interview.  Discussed with patient's son on the phone on 04/05/2019   Disposition:   Discharge to [place] likely Friday.  Consultants: Orthopedics, IR Procedures: Joint aspiration  Scheduled Meds: . amLODipine  2.5 mg Oral Daily  . colchicine  0.3 mg Oral Daily  . feeding supplement (GLUCERNA SHAKE)  237 mL Oral TID BM  . Ferrous Fumarate  106 mg of iron Oral Q breakfast  . insulin aspart  0-5 Units Subcutaneous QHS  . insulin aspart  0-9 Units Subcutaneous TID WC  . lidocaine      . olopatadine  1 drop Both Eyes BID   Continuous Infusions: . sodium chloride 250 mL (04/05/19 0944)  .  ceFAZolin (ANCEF) IV 2 g (04/05/19 0945)   PRN Meds: sodium chloride, acetaminophen, hydrALAZINE, senna-docusate Antibiotics: Anti-infectives (From admission, onward)   Start     Dose/Rate Route Frequency Ordered Stop   04/04/19 2200  ceFAZolin (ANCEF) IVPB 2g/100 mL premix     2 g 200 mL/hr over 30 Minutes Intravenous Every 12 hours 04/04/19 1656     03/31/19 1000  amoxicillin-clavulanate (AUGMENTIN) 875-125 MG per tablet 1 tablet  Status:  Discontinued     1 tablet Oral Every 12 hours 03/31/19 0919 03/31/19 0928   03/31/19 1000  amoxicillin-clavulanate (AUGMENTIN) 500-125 MG per tablet 500 mg  Status:  Discontinued     1 tablet Oral 2 times daily 03/31/19 0929 04/04/19 1656   03/28/19 2200  ceFAZolin (ANCEF) IVPB 2g/100 mL premix  Status:  Discontinued     2 g 200 mL/hr over 30 Minutes Intravenous Every 12 hours 03/28/19 1231 03/31/19 0919   03/26/19 2200  cefTRIAXone (ROCEPHIN) 1 g in sodium chloride 0.9 % 100 mL IVPB  Status:  Discontinued     1 g 200 mL/hr over 30 Minutes Intravenous Every 24 hours 03/26/19 0217 03/26/19 1138   03/26/19 2200  cefTRIAXone (ROCEPHIN) 2 g in sodium chloride 0.9 % 100 mL IVPB  Status:  Discontinued     2 g 200 mL/hr over 30 Minutes Intravenous Every 24 hours 03/26/19 1138 03/28/19 1231   03/25/19 2200  cefTRIAXone (ROCEPHIN) 1 g in sodium chloride 0.9 % 100 mL IVPB     1 g 200 mL/hr over 30 Minutes Intravenous  Once 03/25/19 2151  03/25/19 2234       Objective: Physical Exam: Vitals:   04/04/19 2018 04/05/19 0438 04/05/19 0929 04/05/19 1211  BP: (!) 147/60 (!) 158/70 (!) 127/59 137/66  Pulse: 79 66 76 78  Resp: _0 Temp: 98.6 F (37 C) 99.5 F (37.5 C)  98.7 F (37.1 C)  TempSrc: Oral Oral  Oral  SpO2: 100% 99% 100% 94%  Weight:  68 kg    Height:        Intake/Output Summary (Last 24 hours) at 04/05/2019 1841 Last data filed at 04/05/2019 1814 Gross per 24 hour  Intake 570 ml  Output 450 ml  Net 120 ml   Filed Weights   04/03/19 0507 04/04/19 0615 04/05/19 0438  Weight: 68.2 kg 67 kg 68 kg   General: alert and oriented to place and person. Appear in mild distress, affect flat in affect Eyes: PERRL, Conjunctiva normal ENT: Oral Mucosa Clear, moist  Neck: difficult to assess  JVD, no Abnormal Mass Or lumps Cardiovascular: S1 and S2 Present, no Murmur, peripheral pulses symmetrical and radial normal Respiratory: normal respiratory effort, Bilateral Air entry equal and Decreased, no use of accessory  muscle, Clear to Auscultation, no Crackles, no wheezes Abdomen: Bowel Sound present, Soft and no tenderness, no hernia Skin: no rashes  Extremities: Right upper extremity significant swelling,  no pedal edema, no calf tenderness Neurologic: normal without focal findings, mental status, speech normal, alert and oriented x3, PERLA, Motor strength 5/5 and symmetric and sensation grossly normal Gait not checked due to patient safety concerns  Data Reviewed: CBC: Recent Labs  Lab 03/30/19 0529 03/31/19 0619 04/01/19 0529 04/02/19 0432 04/03/19 0640  WBC 8.9 12.8* 13.3* 10.4 8.3  NEUTROABS 6.7 10.2* 10.5* 7.2  --   HGB 8.9* 9.3* 8.7* 8.4* 8.8*  HCT 28.2* 28.8* 27.2* 26.1* 28.3*  MCV 87.9 87.3 87.2 87.3 89.0  PLT 208 228 269 335 412*   Basic Metabolic Panel: Recent Labs  Lab 03/30/19 0529 03/31/19 0619 04/01/19 0529 04/02/19 0432 04/03/19 0640 04/04/19 0423 04/05/19 0524  NA 138 135  135 133* 133* 134* 138  K 4.2 4.0 4.3 4.8 4.8 5.0 5.1  CL 105 102 99 98 101 101 104  CO2 _0 GLUCOSE 116* 122* 117* 104* 105* 121* 104*  BUN 24* 18 24* 42* 39* 47* 47*  CREATININE 1.26* 1.23* 1.42* 1.91* 1.41* 1.66* 1.63*  CALCIUM 9.3 9.2 9.3 9.1 9.4 9.3 9.0  MG 1.5* 1.4* 1.6* 1.8  --  1.9  --   PHOS 2.0* 2.2* 2.2* 2.6  --   --   --     Liver Function Tests: Recent Labs  Lab 03/30/19 0529 03/31/19 0619 04/01/19 0529 04/02/19 0432 04/03/19 0640  AST  --   --   --   --  35  ALT  --   --   --   --  18  ALKPHOS  --   --   --   --  57  BILITOT  --   --   --   --  0.7  PROT  --   --   --   --  6.0*  ALBUMIN 3.1* 2.9* 2.6* 2.4* 2.7*   No results for input(s): LIPASE, AMYLASE in the last 168 hours. No results for input(s): AMMONIA in the last 168 hours. Coagulation Profile: No results for input(s): INR, PROTIME in the last 168 hours. Cardiac Enzymes: No results for input(s): CKTOTAL, CKMB, CKMBINDEX, TROPONINI in the last 168 hours. BNP (last 3 results) No results for input(s): PROBNP in the last 8760 hours. CBG: Recent Labs  Lab 04/04/19 1615 04/04/19 2122 04/05/19 0709 04/05/19 1208 04/05/19 1639  GLUCAP 111* 134* 117* 104* 122*   Studies: Ir Fluoro Guide Ndl Plmt / Bx  Result Date: 04/05/2019 INDICATION: 85 year old female with right wrist pain. Aspiration requested to evaluate for gout versus septic arthritis. EXAM: ARTHROCENTESIS/INJECTION OF SMALL JOINT COMPARISON:  Right wrist radiograph 04/02/2019 CONTRAST:  None FLUOROSCOPY TIME:  0 minutes 18 seconds 0.1 mGy COMPLICATIONS: None PROCEDURE: Informed written consent was obtained from the patient's son after discussion of the risks, benefits and alternatives to treatment. The patient was placed prone on the fluoroscopy table and the right extremity was imaged. The right radiocarpal joint was localized with fluoroscopy. The skin overlying the anterior aspect of the hip was prepped and draped in usual  sterile fashion. A 22 gauge needle was then advanced into the radiocarpal joint using fluoroscopic guidance, after the overlying soft tissues were anesthetized with 1% lidocaine. A fluoroscopic image was saved and sent to PACs. Approximately 1 mL bloody fluid was successfully aspirated. The needle  was removed and a dressing was placed. The patient tolerated procedure well without immediate postprocedural complication. IMPRESSION: Successful fluoroscopic guided aspiration of the right wrist. Electronically Signed   By: Jacqulynn Cadet M.D.   On: 04/05/2019 14:01   Time spent: 35 minutes  Author: Berle Mull, MD [facility] 04/05/2019 6:41 PM  To reach On-call, see care teams to locate the attending and reach out to them via [email or web address]. If 7PM-7AM, please contact night-coverage If you still have difficulty reaching the attending provider, please page the [facility] (Director on Call) for [facility] on amion for assistance.

## 2019-04-06 LAB — BASIC METABOLIC PANEL
Anion gap: 11 (ref 5–15)
BUN: 47 mg/dL — ABNORMAL HIGH (ref 8–23)
CO2: 22 mmol/L (ref 22–32)
Calcium: 9.2 mg/dL (ref 8.9–10.3)
Chloride: 104 mmol/L (ref 98–111)
Creatinine, Ser: 1.59 mg/dL — ABNORMAL HIGH (ref 0.44–1.00)
GFR calc Af Amer: 34 mL/min — ABNORMAL LOW (ref >60)
GFR calc non Af Amer: 29 mL/min — ABNORMAL LOW (ref >60)
Glucose, Bld: 97 mg/dL (ref 70–99)
Potassium: 4.7 mmol/L (ref 3.5–5.1)
Sodium: 137 mmol/L (ref 135–145)

## 2019-04-06 LAB — GLUCOSE, CAPILLARY
Glucose-Capillary: 87 mg/dL (ref 70–99)
Glucose-Capillary: 136 mg/dL — ABNORMAL HIGH (ref 70–99)
Glucose-Capillary: 95 mg/dL (ref 70–99)
Glucose-Capillary: 107 mg/dL — ABNORMAL HIGH (ref 70–99)

## 2019-04-06 MED ORDER — APIXABAN 2.5 MG PO TABS
2.5000 mg | ORAL_TABLET | Freq: Two times a day (BID) | ORAL | Status: DC
  Administered 2019-04-07: 2.5 mg via ORAL
  Filled 2019-04-06: qty 1

## 2019-04-06 NOTE — Progress Notes (Signed)
[facility] Progress Note  Patient: Courtney Bradford JOI:[id number]   PCP: Iona Beard, MD DOB: 1931/08/13   DOA: 03/25/2019   DOS: 04/06/2019   Date of Service: the patient was seen and examined on 04/06/2019  Brief hospital course: Pt. with PMH of HTN, gout, type II DM, CKD 3, HFpEF, A. fib on Eliquis; admitted on 03/25/2019, presented with complaint of fever and confusion, was found to have sepsis secondary to E. coli and enterococcus bacteremia. Currently further plan is Continue current treatment for right wrist acute arthritis.  Subjective: Patient reports improvement in right wrist pain.  No nausea no vomiting no fever no chills.  No chest pain abdominal pain.  Assessment and Plan: 1. Principal Problem: Acute encephalopathy-metabolic Sepsis secondary to UTI and bacteremia secondary to E. Coli as well as Klebsiella. Sepsis POA-now resolved Left hydroureteronephrosis  Presents with confusion. CT of the head negative for any acute finding.  Show significant atrophy. Blood cultures positive for E. coli. Urine culture positive for E. coli as well as Klebsiella. CT abdomen pelvis on admission with contrast shows evidence of left-sided hydroureteronephrosis without any obstructing stone with small amount of gas in the bladder. No other acute abnormality. Prior provider discussed with ID, patient completed 6 days of IV antibiotics ceftriaxone followed by cefazolin based on sensitivity and currently on oral Augmentin. Plan to continue Antibiotics last day 04/08/2019. Mentation improved but not back to baseline per family.  Anticipating improvement down the road as the patient leaves the hospital and then back to family requirement.  2.  Acute right wrist gout. ?septic arthritis  Received colchicine 1.2 mg loading dose followed by 0.6 mg daily followed by currently 0.3 mg daily based on the worsening renal function. Family is aware of this medication and would like to continue this  medication on a daily basis. Patient received prednisone for 3 days and currently off. Currently holding off on using it further as there is a concern for septic arthritis given her bacteremia. Appreciate orthopedic input. IR consulted for wrist aspiration.  No crystals seen on the aspirate.  Concerning given the presence of severe arthritis. Will await cultures.  Continue IV ceftriaxone. ESR CRP significantly elevated.  3.  Acute worsening of chronic kidney disease stage III. Patient's baseline serum creatinine is around 2 recently. Presented with creatinine of around 1.4. Monitor for now. Recommend outpatient nephrology consultation. Patient and family would not want hemodialysis.  4. Essential hypertension   Chronic diastolic CHF (congestive heart failure) (HCC)   Atrial fibrillation, chronic (HCC)   Elevated troponin   Demand ischemia (HCC)   Sinus bradycardia   First degree AV block On Eliquis at home which we will hold for now Continue Norvasc 2.5 mg daily. Rate controlled  5. Type 2 diabetes, controlled, with renal manifestation (HCC) On 30 units of Humulin at home on a daily basis. Currently on sliding scale insulin. sugars well controlled.  6.  Hypomagnesemia Currently resolved.  7.  Anemia due to chronic kidney disease Hemoglobin stable. No active bleeding. Monitor.  8. Amputation of left index finger Performed in March 2020. Per family patient still has sutures there. Requested orthopedic consultation. Sutures removed 04/02/2019.  9.  Mild hyperkalemia. Change to low potassium diet. Now stable  Diet: Cardiac diet DVT Prophylaxis: Therapeutic Anticoagulation with Eliquis  Advance goals of care discussion: Full code-as per my discussion with patient's son they would not want hemodialysis.  Family Communication: no family was present at bedside, at the time of interview.  Discussed with patient's son on the phone on 04/06/2019   Disposition:  Discharge to  [facility] likely Friday.  Consultants: Orthopedics, IR Procedures: Joint aspiration  Scheduled Meds: . amLODipine  2.5 mg Oral Daily  . colchicine  0.3 mg Oral Daily  . feeding supplement (GLUCERNA SHAKE)  237 mL Oral TID BM  . Ferrous Fumarate  106 mg of iron Oral Q breakfast  . insulin aspart  0-5 Units Subcutaneous QHS  . insulin aspart  0-9 Units Subcutaneous TID WC  . olopatadine  1 drop Both Eyes BID   Continuous Infusions: . sodium chloride 250 mL (04/06/19 1028)  .  ceFAZolin (ANCEF) IV Stopped (04/06/19 1100)   PRN Meds: sodium chloride, acetaminophen, hydrALAZINE, senna-docusate Antibiotics: Anti-infectives (From admission, onward)   Start     Dose/Rate Route Frequency Ordered Stop   04/04/19 2200  ceFAZolin (ANCEF) IVPB 2g/100 mL premix     2 g 200 mL/hr over 30 Minutes Intravenous Every 12 hours 04/04/19 1656     03/31/19 1000  amoxicillin-clavulanate (AUGMENTIN) 875-125 MG per tablet 1 tablet  Status:  Discontinued     1 tablet Oral Every 12 hours 03/31/19 0919 03/31/19 0928   03/31/19 1000  amoxicillin-clavulanate (AUGMENTIN) 500-125 MG per tablet 500 mg  Status:  Discontinued     1 tablet Oral 2 times daily 03/31/19 0929 04/04/19 1656   03/28/19 2200  ceFAZolin (ANCEF) IVPB 2g/100 mL premix  Status:  Discontinued     2 g 200 mL/hr over 30 Minutes Intravenous Every 12 hours 03/28/19 1231 03/31/19 0919   03/26/19 2200  cefTRIAXone (ROCEPHIN) 1 g in sodium chloride 0.9 % 100 mL IVPB  Status:  Discontinued     1 g 200 mL/hr over 30 Minutes Intravenous Every 24 hours 03/26/19 0217 03/26/19 1138   03/26/19 2200  cefTRIAXone (ROCEPHIN) 2 g in sodium chloride 0.9 % 100 mL IVPB  Status:  Discontinued     2 g 200 mL/hr over 30 Minutes Intravenous Every 24 hours 03/26/19 1138 03/28/19 1231   03/25/19 2200  cefTRIAXone (ROCEPHIN) 1 g in sodium chloride 0.9 % 100 mL IVPB     1 g 200 mL/hr over 30 Minutes Intravenous  Once 03/25/19 2151 03/25/19 2234        Objective: Physical Exam: Vitals:   04/06/19 0550 04/06/19 1023 04/06/19 1116 04/06/19 1912  BP: 139/82 130/66 (!) 129/56 (!) 128/56  Pulse: 66 78 78 72  Resp: _0 Temp: 98.6 F (37 C)  98.5 F (36.9 C) 98.8 F (37.1 C)  TempSrc: Oral  Oral Oral  SpO2: 99% 100% 100% 99%  Weight:      Height:        Intake/Output Summary (Last 24 hours) at 04/06/2019 2016 Last data filed at 04/06/2019 1933 Gross per 24 hour  Intake 1379.36 ml  Output 950 ml  Net 429.36 ml   Filed Weights   04/03/19 0507 04/04/19 0615 04/05/19 0438  Weight: 68.2 kg 67 kg 68 kg   General: alert and oriented to place and person. Appear in mild distress, affect flat in affect Eyes: PERRL, Conjunctiva normal ENT: Oral Mucosa Clear, moist  Neck: difficult to assess  JVD, no Abnormal Mass Or lumps Cardiovascular: S1 and S2 Present, no Murmur, peripheral pulses symmetrical and radial normal Respiratory: normal respiratory effort, Bilateral Air entry equal and Decreased, no use of accessory muscle, Clear to Auscultation, no Crackles, no wheezes Abdomen: Bowel Sound present, Soft and no tenderness,  no hernia Skin: no rashes  Extremities: Right upper extremity significant swelling,  no pedal edema, no calf tenderness Neurologic: normal without focal findings, mental status, speech normal, alert and oriented x3, PERLA, Motor strength 5/5 and symmetric and sensation grossly normal Gait not checked due to patient safety concerns  Data Reviewed: CBC: Recent Labs  Lab 03/31/19 0619 04/01/19 0529 04/02/19 0432 04/03/19 0640  WBC 12.8* 13.3* 10.4 8.3  NEUTROABS 10.2* 10.5* 7.2  --   HGB 9.3* 8.7* 8.4* 8.8*  HCT 28.8* 27.2* 26.1* 28.3*  MCV 87.3 87.2 87.3 89.0  PLT 228 269 335 587*   Basic Metabolic Panel: Recent Labs  Lab 03/31/19 0619 04/01/19 0529 04/02/19 0432 04/03/19 0640 04/04/19 0423 04/05/19 0524 04/06/19 0501  NA 135 135 133* 133* 134* 138 137  K 4.0 4.3 4.8 4.8 5.0 5.1 4.7  CL 102 99  98 101 101 104 104  CO2 _0 GLUCOSE 122* 117* 104* 105* 121* 104* 97  BUN 18 24* 42* 39* 47* 47* 47*  CREATININE 1.23* 1.42* 1.91* 1.41* 1.66* 1.63* 1.59*  CALCIUM 9.2 9.3 9.1 9.4 9.3 9.0 9.2  MG 1.4* 1.6* 1.8  --  1.9  --   --   PHOS 2.2* 2.2* 2.6  --   --   --   --     Liver Function Tests: Recent Labs  Lab 03/31/19 0619 04/01/19 0529 04/02/19 0432 04/03/19 0640  AST  --   --   --  35  ALT  --   --   --  18  ALKPHOS  --   --   --  57  BILITOT  --   --   --  0.7  PROT  --   --   --  6.0*  ALBUMIN 2.9* 2.6* 2.4* 2.7*   No results for input(s): LIPASE, AMYLASE in the last 168 hours. No results for input(s): AMMONIA in the last 168 hours. Coagulation Profile: No results for input(s): INR, PROTIME in the last 168 hours. Cardiac Enzymes: No results for input(s): CKTOTAL, CKMB, CKMBINDEX, TROPONINI in the last 168 hours. BNP (last 3 results) No results for input(s): PROBNP in the last 8760 hours. CBG: Recent Labs  Lab 04/05/19 1639 04/05/19 2218 04/06/19 0647 04/06/19 1112 04/06/19 1628  GLUCAP 122* 101* 87 136* 95   Studies: No results found. Time spent: 35 minutes  Author: Berle Mull, MD [facility] 04/06/2019 8:16 PM  To reach On-call, see care teams to locate the attending and reach out to them via [email or web address]. If 7PM-7AM, please contact night-coverage If you still have difficulty reaching the attending provider, please page the [facility] (Director on Call) for [facility] on amion for assistance.

## 2019-04-06 NOTE — Progress Notes (Signed)
ANTICOAGULATION CONSULT NOTE - Initial Consult  Pharmacy Consult for Eliquis Indication: atrial fibrillation  Allergies  Allergen Reactions  . Lactose Intolerance (Gi) Other (See Comments)    Upset stomach   Patient Measurements: Height: 5\' 6"  (167.6 cm) Weight: 150 lb (68 kg) IBW/kg (Calculated) : 59.3   Vital Signs: Temp: 98.8 F (37.1 C) (06/07 1912) Temp Source: Oral (06/07 1912) BP: 128/56 (06/07 1912) Pulse Rate: 72 (06/07 1912)  Labs: Recent Labs    04/04/19 0423 04/05/19 0524 04/06/19 0501  CREATININE 1.66* 1.63* 1.59*   Estimated Creatinine Clearance: 24.2 mL/min (A) (by C-G formula based on SCr of 1.59 mg/dL (H)).   Assessment: 67 yoF presenting with fever and AMS, found to have E.coli bacteremia. PMH afib on Eliquis PTA. Pharmacy consulted to resume Eliquis. Patient weighs 68kg and Scr 1.59. Due to her age and renal function, Eliquis should be dose reduced to 2.5mg  BID. This change was made during her last admission in March 2020. However, the patient did not make this change outpatient and has still been taking 5mg  BID. Last CBC 6/4, Hgb low stable and pltc 414. No bleeding noted.  Goal of Therapy:  Monitor platelets by anticoagulation protocol: Yes   Plan:  Start Eliquis 2.5mg  PO BID Ensure patient is educated on new dose prior to discharge Monitor s/sx of bleeding  Jonathon Tan N. Gerarda Fraction, PharmD, [place] PGY2 Infectious Diseases Pharmacy Resident Phone: [phone] 04/06/2019,8:23 PM

## 2019-04-06 NOTE — Plan of Care (Signed)
  Problem: Activity: ?Goal: Capacity to carry out activities will improve ?Outcome: Progressing ?  ?Problem: Elimination: ?Goal: Will not experience complications related to bowel motility ?Outcome: Progressing ?  ?

## 2019-04-07 ENCOUNTER — Inpatient Hospital Stay
Admission: RE | Admit: 2019-04-07 | Discharge: 2019-04-25 | Disposition: A | Discharge status: Nursing Home (Includes ICF) | Payer: Medicare Other | Source: Ambulatory Visit | Attending: Internal Medicine | Admitting: Internal Medicine

## 2019-04-07 DIAGNOSIS — G9341 Metabolic encephalopathy: Secondary | ICD-10-CM | POA: Diagnosis not present

## 2019-04-07 DIAGNOSIS — I959 Hypotension, unspecified: Secondary | ICD-10-CM | POA: Diagnosis not present

## 2019-04-07 DIAGNOSIS — R001 Bradycardia, unspecified: Secondary | ICD-10-CM | POA: Diagnosis not present

## 2019-04-07 DIAGNOSIS — E119 Type 2 diabetes mellitus without complications: Secondary | ICD-10-CM | POA: Diagnosis not present

## 2019-04-07 DIAGNOSIS — I130 Hypertensive heart and chronic kidney disease with heart failure and stage 1 through stage 4 chronic kidney disease, or unspecified chronic kidney disease: Secondary | ICD-10-CM | POA: Diagnosis not present

## 2019-04-07 DIAGNOSIS — N183 Chronic kidney disease, stage 3 (moderate): Secondary | ICD-10-CM | POA: Diagnosis not present

## 2019-04-07 DIAGNOSIS — E875 Hyperkalemia: Secondary | ICD-10-CM | POA: Diagnosis not present

## 2019-04-07 DIAGNOSIS — I517 Cardiomegaly: Secondary | ICD-10-CM | POA: Diagnosis not present

## 2019-04-07 DIAGNOSIS — M6281 Muscle weakness (generalized): Secondary | ICD-10-CM | POA: Diagnosis not present

## 2019-04-07 DIAGNOSIS — R410 Disorientation, unspecified: Secondary | ICD-10-CM | POA: Diagnosis not present

## 2019-04-07 DIAGNOSIS — Z7401 Bed confinement status: Secondary | ICD-10-CM | POA: Diagnosis not present

## 2019-04-07 DIAGNOSIS — I482 Chronic atrial fibrillation, unspecified: Secondary | ICD-10-CM | POA: Diagnosis not present

## 2019-04-07 DIAGNOSIS — E1122 Type 2 diabetes mellitus with diabetic chronic kidney disease: Secondary | ICD-10-CM | POA: Diagnosis not present

## 2019-04-07 DIAGNOSIS — N1330 Unspecified hydronephrosis: Secondary | ICD-10-CM | POA: Diagnosis not present

## 2019-04-07 DIAGNOSIS — N390 Urinary tract infection, site not specified: Secondary | ICD-10-CM | POA: Diagnosis not present

## 2019-04-07 DIAGNOSIS — Z7901 Long term (current) use of anticoagulants: Secondary | ICD-10-CM | POA: Diagnosis not present

## 2019-04-07 DIAGNOSIS — I5032 Chronic diastolic (congestive) heart failure: Secondary | ICD-10-CM | POA: Diagnosis not present

## 2019-04-07 DIAGNOSIS — B961 Klebsiella pneumoniae [K. pneumoniae] as the cause of diseases classified elsewhere: Secondary | ICD-10-CM | POA: Diagnosis not present

## 2019-04-07 DIAGNOSIS — Z794 Long term (current) use of insulin: Secondary | ICD-10-CM | POA: Diagnosis not present

## 2019-04-07 DIAGNOSIS — I10 Essential (primary) hypertension: Secondary | ICD-10-CM | POA: Diagnosis not present

## 2019-04-07 DIAGNOSIS — R262 Difficulty in walking, not elsewhere classified: Secondary | ICD-10-CM | POA: Diagnosis not present

## 2019-04-07 DIAGNOSIS — I248 Other forms of acute ischemic heart disease: Secondary | ICD-10-CM | POA: Diagnosis not present

## 2019-04-07 DIAGNOSIS — M10331 Gout due to renal impairment, right wrist: Secondary | ICD-10-CM | POA: Diagnosis not present

## 2019-04-07 DIAGNOSIS — R29898 Other symptoms and signs involving the musculoskeletal system: Secondary | ICD-10-CM | POA: Diagnosis not present

## 2019-04-07 DIAGNOSIS — I440 Atrioventricular block, first degree: Secondary | ICD-10-CM | POA: Diagnosis not present

## 2019-04-07 DIAGNOSIS — M2550 Pain in unspecified joint: Secondary | ICD-10-CM | POA: Diagnosis not present

## 2019-04-07 DIAGNOSIS — Z741 Need for assistance with personal care: Secondary | ICD-10-CM | POA: Diagnosis not present

## 2019-04-07 DIAGNOSIS — D631 Anemia in chronic kidney disease: Secondary | ICD-10-CM | POA: Diagnosis not present

## 2019-04-07 DIAGNOSIS — B9620 Unspecified Escherichia coli [E. coli] as the cause of diseases classified elsewhere: Secondary | ICD-10-CM | POA: Diagnosis not present

## 2019-04-07 DIAGNOSIS — S68111D Complete traumatic metacarpophalangeal amputation of left index finger, subsequent encounter: Secondary | ICD-10-CM | POA: Diagnosis not present

## 2019-04-07 DIAGNOSIS — I700 Atherosclerosis of aorta: Secondary | ICD-10-CM | POA: Diagnosis not present

## 2019-04-07 DIAGNOSIS — G9340 Encephalopathy, unspecified: Secondary | ICD-10-CM | POA: Diagnosis not present

## 2019-04-07 DIAGNOSIS — M10031 Idiopathic gout, right wrist: Secondary | ICD-10-CM | POA: Diagnosis not present

## 2019-04-07 LAB — BASIC METABOLIC PANEL
Anion gap: 10 (ref 5–15)
BUN: 47 mg/dL — ABNORMAL HIGH (ref 8–23)
CO2: 22 mmol/L (ref 22–32)
Calcium: 9 mg/dL (ref 8.9–10.3)
Chloride: 105 mmol/L (ref 98–111)
Creatinine, Ser: 1.58 mg/dL — ABNORMAL HIGH (ref 0.44–1.00)
GFR calc Af Amer: 34 mL/min — ABNORMAL LOW (ref >60)
GFR calc non Af Amer: 30 mL/min — ABNORMAL LOW (ref >60)
Glucose, Bld: 96 mg/dL (ref 70–99)
Potassium: 4.7 mmol/L (ref 3.5–5.1)
Sodium: 137 mmol/L (ref 135–145)

## 2019-04-07 LAB — GLUCOSE, CAPILLARY
Glucose-Capillary: 95 mg/dL (ref 70–99)
Glucose-Capillary: 98 mg/dL (ref 70–99)
Glucose-Capillary: 117 mg/dL — ABNORMAL HIGH (ref 70–99)

## 2019-04-07 MED ORDER — DOXYCYCLINE HYCLATE 100 MG PO TABS
100.0000 mg | ORAL_TABLET | Freq: Two times a day (BID) | ORAL | Status: DC
  Administered 2019-04-07: 100 mg via ORAL
  Filled 2019-04-07: qty 1

## 2019-04-07 MED ORDER — GLUCERNA SHAKE PO LIQD: 237.0000 mL | Freq: Three times a day (TID) | ORAL | 0 refills | Status: AC

## 2019-04-07 MED ORDER — HYDROCODONE-ACETAMINOPHEN 5-325 MG PO TABS: 1.0000 | ORAL_TABLET | Freq: Four times a day (QID) | ORAL | 0 refills | Status: DC | PRN

## 2019-04-07 MED ORDER — AMOXICILLIN-POT CLAVULANATE 500-125 MG PO TABS: 1.0000 | ORAL_TABLET | Freq: Two times a day (BID) | ORAL | 0 refills | Status: AC

## 2019-04-07 MED ORDER — SENNOSIDES-DOCUSATE SODIUM 8.6-50 MG PO TABS: 2.0000 | ORAL_TABLET | Freq: Every evening | ORAL | 0 refills | Status: DC | PRN

## 2019-04-07 MED ORDER — AMOXICILLIN-POT CLAVULANATE 500-125 MG PO TABS
1.0000 | ORAL_TABLET | Freq: Two times a day (BID) | ORAL | Status: DC
  Filled 2019-04-07: qty 1

## 2019-04-07 MED ORDER — COLCHICINE 0.6 MG PO TABS
0.6000 mg | ORAL_TABLET | Freq: Every day | ORAL | Status: DC
  Filled 2019-04-07: qty 1

## 2019-04-07 MED ORDER — DOXYCYCLINE HYCLATE 100 MG PO TABS: 100.0000 mg | ORAL_TABLET | Freq: Two times a day (BID) | ORAL | 0 refills | Status: AC

## 2019-04-07 MED ORDER — COLCHICINE 0.6 MG PO TABS
0.3000 mg | ORAL_TABLET | Freq: Once | ORAL | Status: AC
  Administered 2019-04-07: 0.3 mg via ORAL
  Filled 2019-04-07: qty 0.5

## 2019-04-07 NOTE — Progress Notes (Signed)
Ambulance Crew transported patient from bed to stretcher without diffulculty report given to ambulance crew. No distress noted. Ambulance crew left with patient's rolling black luggage to take to facility with patient.

## 2019-04-07 NOTE — Progress Notes (Signed)
Pharmacy Antibiotic Note  Courtney Bradford is a 85 y.o. female admitted on 03/25/2019 with fever and AMS.  Patient was started on Rocephin for E.coli + Kleb pneumo UTI/urosepsis and E.coli bacteremia, then narrowed to Ancef and then to Augmentin.  Now to get wrist aspiration, so Pharmacy has been consulted to switch patient back to Ancef.  CrCl ~25 ml/min, planning to keep ABX through 6/9.   Plan: -Continue Ancef 2gm IV Q12H -Monitor renal fxn, micro data  Height: 5\' 6"  (167.6 cm) Weight: 147 lb 11.3 oz (67 kg) IBW/kg (Calculated) : 59.3  Temp (24hrs), Avg:98.4 F (36.9 C), Min:98 F (36.7 C), Max:98.8 F (37.1 C)  Recent Labs  Lab 04/01/19 0529 04/02/19 0432 04/03/19 0640 04/04/19 0423 04/05/19 0524 04/06/19 0501 04/07/19 0504  WBC 13.3* 10.4 8.3  --   --   --   --   CREATININE 1.42* 1.91* 1.41* 1.66* 1.63* 1.59* 1.58*    Estimated Creatinine Clearance: 24.4 mL/min (A) (by C-G formula based on SCr of 1.58 mg/dL (H)).    Allergies  Allergen Reactions  . Lactose Intolerance (Gi) Other (See Comments)    Upset stomach    Ceftriaxone 5/27>>5/29 Cefazolin 5/29>>6/1, restarted 6/5 >> Augmentin 6/1>> 6/5  5/26 blood x 2 - 1 of 2 with E coli, pansensitive 5/27 BCID - E coli 5/26 urine - >100K/ml E coli (R cipro, amp, S CTX, Cefazolin, Unasyn )and Klebsiella (R amp, S CTX, cefazolin, unasyn) 5/26 COVID: negative   Arrie Senate, PharmD, BCPS Clinical Pharmacist [phone] Please check AMION for all [facility] Pharmacy numbers 04/07/2019

## 2019-04-07 NOTE — Discharge Summary (Signed)
[facility] Discharge Summary   Patient: Courtney Bradford XBJ:[id number]   PCP: Iona Beard, MD DOB: Oct 13, 1931   Date of admission: 03/25/2019   Date of discharge:  04/07/2019    Discharge Diagnoses:  Principal Problem:   Acute encephalopathy Active Problems:   Essential hypertension   Chronic diastolic CHF (congestive heart failure) (HCC)   Atrial fibrillation, chronic (HCC)   Type 2 diabetes, controlled, with renal manifestation (HCC)   Lower urinary tract infectious disease   CKD (chronic kidney disease), stage III (HCC)   Hypomagnesemia   Hydroureteronephrosis   Elevated troponin   Demand ischemia (HCC)   Sinus bradycardia   First degree AV block   Aortic atherosclerosis (HCC)   Mild cardiomegaly   Acute gout of right wrist   Anemia due to chronic kidney disease   Amputation of left index finger  Admitted From: HOME Disposition:  SNF   Recommendations for Outpatient Follow-up:  1. Please follow up with PCP in 1 week    Contact information for follow-up providers    Home, [facility] Follow up.   Specialty:  Home Health Services Why:  They will continue to do your home helath Physical Therapy at your home. RN , Occupational Therapy and [place] have been added. Contact information: [address] [place] [address] [phone]        Iona Beard, MD. Schedule an appointment as soon as possible for a visit in 1 week(s).   Specialty:  Family Medicine Contact information: [address] [place]  [address] [phone]            Contact information for after-discharge care    [facility] .   Service:  Skilled Nursing Contact information: [address] [place] [phone]                 Diet recommendation: cardiac diet- low potassium  Activity: The patient is advised to gradually reintroduce usual activities.  Discharge Condition:  good  Code Status: full code  History of present illness: As per the H and P dictated on admission, "Patient is an 85 year old African-American female medical history significant for hypertension, gout, diabetes mellitus, chronic kidney disease stage III diastolic heart failure and atrial fibrillation on Eliquis.  As per collateral information, patient has had acute toxic encephalopathy secondary to UTI in the past.  Patient was noted to have developed altered mentation, being slow to respond.  Fever was reported with associated vomiting and diarrhea as per ER provider.  Patient is a poor historian and cannot give any significant history.  On presentation to the hospital, patient was found to be febrile with temperature of 101.6 F and UA days suggestive of likely UTI.  No headache, no neck pain, no shortness of breath, no URI symptoms, no chest pain, no urinary symptoms.  Patient be admitted for further assessment and management.  ED Course: Patient was pancultured and started on IV antibiotics.  Hospitalist team was consulted to admit patient."  Hospital Course:  Summary of her active problems in the hospital is as following. 1. Principal Problem: Acute encephalopathy-metabolic Sepsis secondary to UTI and bacteremia secondary to E. Coli as well as Klebsiella. Sepsis POA-now resolved Left hydroureteronephrosis  Presents with confusion. CT of the head negative for any acute finding.  Show significant atrophy. Blood cultures positive for E. coli. Urine culture positive for E. coli as well as Klebsiella.  CT abdomen pelvis on admission with contrast shows evidence of left-sided hydroureteronephrosis without any obstructing stone with small amount of gas in the bladder. No other acute abnormality. Prior provider discussed with ID, patient completed 6 days of IV antibiotics ceftriaxone followed by cefazolin based on sensitivity and currently on oral Augmentin. Plan to continue Antibiotics last day  04/08/2019. Mentation improved.  Anticipating further improvement down the road as the patient leaves the hospital and then back to familiar environnment  2.  Acute right wrist gout. septic arthritis ruled out Received colchicine 1.2 mg loading dose followed by 0.6 mg daily followed by currently 0.3 mg daily based on the worsening renal function. Family is aware of this medication and would like to continue this medication on a daily basis. Patient received prednisone for 3 days and currently off. Currently holding off on using it further as there is a concern for septic arthritis given her bacteremia. Appreciate orthopedic input. IR consulted for wrist aspiration.  No crystals seen on the aspirate.  No growth on cultures. ESR CRP significantly elevated. Plan is to continue colchicine and provide longer duration of Antibiotics with broader coverage for possible cellulitis  Adding doxycycline to Augmentin for 6 more days.  3.  Acute worsening of chronic kidney disease stage III. Patient's baseline serum creatinine is around 2 recently. Presented with creatinine of around 1.4. Monitor for now. Recommend outpatient nephrology consultation. Patient and family would not want hemodialysis.  4. Essential hypertension   Chronic diastolic CHF (congestive heart failure) (HCC)   Atrial fibrillation, chronic (HCC)   Elevated troponin   Demand ischemia (HCC)   Sinus bradycardia   First degree AV block On Eliquis Continue Norvasc 2.5 mg daily. Rate controlled  5. Type 2 diabetes, controlled, with renal manifestation (HCC) On 30 units of Humulin at home on a daily basis. Currently on sliding scale insulin. sugars well controlled.  6.  Hypomagnesemia Currently resolved.  7.  Anemia due to chronic kidney disease Hemoglobin stable. No active bleeding. Monitor.  8. Amputation of left index finger Performed in March 2020. Per family patient still has sutures there. Requested  orthopedic consultation. Sutures removed 04/02/2019.  9.  Mild hyperkalemia. Change to low potassium diet. Now stable  Patient was seen by physical therapy, who recommended SNF, which was arranged by Education officer, museum. On the day of the discharge the patient's vitals were stable , and no other acute medical condition were reported by patient. the patient was felt safe to be discharge at SNF with therapy.  Consultants: orthopedics, IR Procedures: wrist aspiration   DISCHARGE MEDICATION: Allergies as of 04/07/2019      Reactions   Lactose Intolerance (gi) Other (See Comments)   Upset stomach      Medication List    STOP taking these medications   metoprolol tartrate 25 MG tablet Commonly known as:  LOPRESSOR     TAKE these medications   acetaminophen 500 MG tablet Commonly known as:  TYLENOL Take 500 mg by mouth every 6 (six) hours as needed for mild pain.   amLODipine 2.5 MG tablet Commonly known as:  NORVASC Take 1 tablet (2.5 mg total) by mouth daily.   amoxicillin-clavulanate 500-125 MG tablet Commonly known as:  AUGMENTIN Take 1 tablet (500 mg total) by mouth 2 (two) times a day for 6 days.   apixaban 2.5 MG Tabs tablet Commonly known as:  ELIQUIS Take 1 tablet (2.5 mg total) by mouth 2 (two) times daily. What changed:    how  much to take  when to take this   colchicine 0.6 MG tablet Take 0.5 tablets (0.3 mg total) by mouth daily.   doxycycline 100 MG tablet Commonly known as:  VIBRA-TABS Take 1 tablet (100 mg total) by mouth 2 (two) times daily for 6 days.   feeding supplement (GLUCERNA SHAKE) Liqd Take 237 mLs by mouth 3 (three) times daily between meals.   ferrous fumarate 325 (106 Fe) MG Tabs tablet Commonly known as:  HEMOCYTE - 106 mg FE Take 1 tablet by mouth every morning.   furosemide 20 MG tablet Commonly known as:  LASIX Take 1 tablet (20 mg total) by mouth every Monday, Wednesday, and Friday. DISCUSS WITH CARDIOLOGY PRIOR to RESUMING     HumuLIN N 100 UNIT/ML injection Generic drug:  insulin NPH Human Inject 30 Units into the skin daily.   HYDROcodone-acetaminophen 5-325 MG tablet Commonly known as:  NORCO/VICODIN Take 1-2 tablets by mouth every 6 (six) hours as needed. What changed:  reasons to take this   olopatadine 0.1 % ophthalmic solution Commonly known as:  PATANOL Place 1 drop into both eyes 2 (two) times daily.   senna-docusate 8.6-50 MG tablet Commonly known as:  Senokot-S Take 2 tablets by mouth at bedtime as needed for mild constipation or moderate constipation.      Allergies  Allergen Reactions   Lactose Intolerance (Gi) Other (See Comments)    Upset stomach   Discharge Instructions    Diet - low sodium heart healthy   Complete by:  As directed    Discharge instructions   Complete by:  As directed    It is important that you read the given instructions as well as go over your medication list with RN to help you understand your care after this hospitalization.  Discharge Instructions: Please follow-up with PCP in 1-2 weeks  Please request your primary care physician to go over all Hospital Tests and Procedure/Radiological results at the follow up. Please get all Hospital records sent to your PCP by signing hospital release before you go home.   Do not take more than prescribed Pain, Sleep and Anxiety Medications. You were cared for by a hospitalist during your hospital stay. If you have any questions about your discharge medications or the care you received while you were in the hospital after you are discharged, you can call the unit '@UNIT' @ you were admitted to and ask to speak with the hospitalist on call if the hospitalist that took care of you is not available.  Once you are discharged, your primary care physician will handle any further medical issues. Please note that NO REFILLS for any discharge medications will be authorized once you are discharged, as it is imperative that you return  to your primary care physician (or establish a relationship with a primary care physician if you do not have one) for your aftercare needs so that they can reassess your need for medications and monitor your lab values. You Must read complete instructions/literature along with all the possible adverse reactions/side effects for all the Medicines you take and that have been prescribed to you. Take any new Medicines after you have completely understood and accept all the possible adverse reactions/side effects.   Increase activity slowly   Complete by:  As directed      Discharge Exam: Filed Weights   04/04/19 0615 04/05/19 0438 04/07/19 0217  Weight: 67 kg 68 kg 67 kg   Vitals:   04/07/19 0608 04/07/19 1137  BP: Marland Kitchen)  165/73 (!) 159/58  Pulse: 79 68  Resp: 18 18  Temp: 98 F (36.7 C) 98.4 F (36.9 C)  SpO2: 100% 100%   General: Appear in no distress, no Rash; Oral Mucosa clear. no Abnormal Mass Or lumps Cardiovascular: S1 and S2 Present, no Murmur, Respiratory: normal respiratory effort, Bilateral Air entry present and Clear to Auscultation, no Crackles, no wheezes Abdomen: Bowel Sound present, Soft and no tenderness, no hernia Extremities: no Pedal edema, no calf tenderness, right upper extremity edeam Neurology: alert and oriented to time, place, and person affect appropriate. normal without focal findings, mental status, speech normal, alert and oriented x3, PERLA, Motor strength 5/5 and symmetric and sensation grossly normal to light touch   The results of significant diagnostics from this hospitalization (including imaging, microbiology, ancillary and laboratory) are listed below for reference.    Significant Diagnostic Studies: Dg Wrist Complete Right  Result Date: 04/02/2019 CLINICAL DATA:  Generalized wrist pain, no known injury, initial encounter EXAM: RIGHT WRIST - COMPLETE 3+ VIEW COMPARISON:  None. FINDINGS: Mild degenerative changes are noted the first [facility] joint. Some  calcifications are noted along the anterior aspect of the carpal bones. Mild generalized soft tissue swelling is noted without acute bony abnormality. IMPRESSION: Chronic changes without acute abnormality. Electronically Signed   By: Inez Catalina M.D.   On: 04/02/2019 15:13   Ct Head Wo Contrast  Result Date: 03/25/2019 CLINICAL DATA:  Decreased level of consciousness EXAM: CT HEAD WITHOUT CONTRAST TECHNIQUE: Contiguous axial images were obtained from the base of the skull through the vertex without intravenous contrast. COMPARISON:  02/29/2016 FINDINGS: Brain: Chronic atrophic and ischemic changes are noted. No findings to suggest acute hemorrhage, acute infarction or space-occupying mass lesion are noted. Vascular: No hyperdense vessel or unexpected calcification. Skull: Normal. Negative for fracture or focal lesion. Sinuses/Orbits: No acute finding. Other: None. IMPRESSION: Chronic atrophic and ischemic changes without acute abnormality. Electronically Signed   By: Inez Catalina M.D.   On: 03/25/2019 21:30   Ct Abdomen Pelvis W Contrast  Result Date: 03/25/2019 CLINICAL DATA:  85 year old female with history of decreased responsiveness. Febrile patient. Confusion. Black tarry stools. Vomiting. EXAM: CT ABDOMEN AND PELVIS WITH CONTRAST TECHNIQUE: Multidetector CT imaging of the abdomen and pelvis was performed using the standard protocol following bolus administration of intravenous contrast. CONTRAST:  119m OMNIPAQUE IOHEXOL 300 MG/ML  SOLN COMPARISON:  No priors. FINDINGS: Lower chest: Small right Bochdalek's hernia.  Mild cardiomegaly. Hepatobiliary: No suspicious cystic or solid hepatic lesions. No intra or extrahepatic biliary ductal dilatation. Small amount of high attenuation material lying dependently in the gallbladder which may reflect biliary sludge and/or tiny gallstones. No findings to suggest an acute cholecystitis at this time. Pancreas: No pancreatic mass. No pancreatic ductal dilatation.  No pancreatic or peripancreatic fluid or inflammatory changes. Spleen: Unremarkable. Adrenals/Urinary Tract: In the upper pole of the right kidney there is a 1.8 cm low-attenuation lesion, compatible with a simple cyst. Left kidney and bilateral adrenal glands are normal in appearance. Mild left hydroureteronephrosis which terminates in the proximal third of the left ureter. No right hydroureteronephrosis. Small amount of gas non dependently in the lumen of the urinary bladder. Urinary bladder is otherwise unremarkable in appearance. Stomach/Bowel: Normal appearance of the stomach. No pathologic dilatation of small bowel or colon. Numerous colonic diverticulae are noted, without surrounding inflammatory changes to suggest an acute diverticulitis at this time. Normal appendix. Vascular/Lymphatic: Aortic atherosclerosis, without evidence of aneurysm or dissection in the abdominal or pelvic vasculature.  No lymphadenopathy noted in the abdomen or pelvis. Reproductive: Status post hysterectomy. Ovaries are not confidently identified may be surgically absent or atrophic Other: No significant volume of ascites.  No pneumoperitoneum. Musculoskeletal: There are no aggressive appearing lytic or blastic lesions noted in the visualized portions of the skeleton. IMPRESSION: 1. Extensive colonic diverticulosis without evidence to suggest an acute diverticulitis at this time. 2. Mild left hydroureteronephrosis terminating in the proximal third of the left ureter. No obstructing stone identified. The possibility of a ureteral stricture could be considered. 3. Small amount of gas non dependently in the urinary bladder. This may be iatrogenic if there has been recent catheterization. In the absence of catheterization, correlation with urinalysis is suggested to exclude the possibility of urinary tract infection with gas-forming organisms. 4. Aortic atherosclerosis. 5. Mild cardiomegaly. 6. Trace amount of biliary sludge and/or tiny  gallstones lying dependently in the gallbladder. No findings to suggest an acute cholecystitis at this time. Electronically Signed   By: Vinnie Langton M.D.   On: 03/25/2019 21:45   Ir Fluoro Guide Ndl Plmt / Bx  Result Date: 04/05/2019 INDICATION: 85 year old female with right wrist pain. Aspiration requested to evaluate for gout versus septic arthritis. EXAM: ARTHROCENTESIS/INJECTION OF SMALL JOINT COMPARISON:  Right wrist radiograph 04/02/2019 CONTRAST:  None FLUOROSCOPY TIME:  0 minutes 18 seconds 0.1 mGy COMPLICATIONS: None PROCEDURE: Informed written consent was obtained from the patient's son after discussion of the risks, benefits and alternatives to treatment. The patient was placed prone on the fluoroscopy table and the right extremity was imaged. The right radiocarpal joint was localized with fluoroscopy. The skin overlying the anterior aspect of the hip was prepped and draped in usual sterile fashion. A 22 gauge needle was then advanced into the radiocarpal joint using fluoroscopic guidance, after the overlying soft tissues were anesthetized with 1% lidocaine. A fluoroscopic image was saved and sent to PACs. Approximately 1 mL bloody fluid was successfully aspirated. The needle was removed and a dressing was placed. The patient tolerated procedure well without immediate postprocedural complication. IMPRESSION: Successful fluoroscopic guided aspiration of the right wrist. Electronically Signed   By: Jacqulynn Cadet M.D.   On: 04/05/2019 14:01   Dg Chest Port 1 View  Result Date: 03/25/2019 CLINICAL DATA:  Fever. EXAM: PORTABLE CHEST 1 VIEW COMPARISON:  Chest x-ray dated July 21, 2014. FINDINGS: Unchanged mild cardiomegaly. Normal mediastinal contours. Normal pulmonary vascularity. No focal consolidation, pleural effusion, or pneumothorax. No acute osseous abnormality. IMPRESSION: No active disease. Electronically Signed   By: Titus Dubin M.D.   On: 03/25/2019 19:13   Vas [place] Upper  Extremity Venous Duplex  Result Date: 04/03/2019 UPPER VENOUS STUDY  Indications: Pain Limitations: Adduction of arm to side. Performing Technologist: June Leap RDMS, RVT  Examination Guidelines: A complete evaluation includes B-mode imaging, spectral Doppler, color Doppler, and power Doppler as needed of all accessible portions of each vessel. Bilateral testing is considered an integral part of a complete examination. Limited examinations for reoccurring indications may be performed as noted.  Right Findings: +----------+------------+---------+-----------+----------+--------------+  RIGHT      Compressible Phasicity Spontaneous Properties    Summary      +----------+------------+---------+-----------+----------+--------------+  IJV            Full        Yes        Yes                                +----------+------------+---------+-----------+----------+--------------+  Subclavian     Full        Yes        Yes                                +----------+------------+---------+-----------+----------+--------------+  Axillary                                                 Not visualized  +----------+------------+---------+-----------+----------+--------------+  Brachial       Full        Yes        Yes                                +----------+------------+---------+-----------+----------+--------------+  Radial         Full                                                      +----------+------------+---------+-----------+----------+--------------+  Ulnar          Full                                                      +----------+------------+---------+-----------+----------+--------------+  Cephalic       Full                                                      +----------+------------+---------+-----------+----------+--------------+  Basilic                                                  Not visualized  +----------+------------+---------+-----------+----------+--------------+  Left Findings:  +----------+------------+---------+-----------+----------+-------+  LEFT       Compressible Phasicity Spontaneous Properties Summary  +----------+------------+---------+-----------+----------+-------+  Subclavian                 Yes        Yes                         +----------+------------+---------+-----------+----------+-------+  Summary:  Right: No evidence of deep vein thrombosis in the upper extremity. No evidence of superficial vein thrombosis in the upper extremity. Limited exam- see comments above.  Left: No evidence of thrombosis in the subclavian.  *See table(s) above for measurements and observations.  Diagnosing physician: Monica Martinez MD Electronically signed by Monica Martinez MD on 04/03/2019 at 5:24:34 PM.    Final     Microbiology: Recent Results (from the past 240 hour(s))  Body fluid culture     Status: None (Preliminary result)   Collection Time: 04/05/19 11:12 AM  Result Value Ref Range Status   Specimen Description SYNOVIAL RIGHT WRIST  Final  Special Requests NONE  Final   Gram Stain   Final    FEW WBC PRESENT,BOTH PMN AND MONONUCLEAR NO ORGANISMS SEEN    Culture   Final    NO GROWTH 2 DAYS Performed at [facility], [address], [place], [place] [address]    Report Status PENDING  Incomplete     Labs: CBC: Recent Labs  Lab 04/01/19 0529 04/02/19 0432 04/03/19 0640  WBC 13.3* 10.4 8.3  NEUTROABS 10.5* 7.2  --   HGB 8.7* 8.4* 8.8*  HCT 27.2* 26.1* 28.3*  MCV 87.2 87.3 89.0  PLT 269 335 604*   Basic Metabolic Panel: Recent Labs  Lab 04/01/19 0529 04/02/19 0432 04/03/19 0640 04/04/19 0423 04/05/19 0524 04/06/19 0501 04/07/19 0504  NA 135 133* 133* 134* 138 137 137  K 4.3 4.8 4.8 5.0 5.1 4.7 4.7  CL 99 98 101 101 104 104 105  CO2 '25 23 22 22 24 22 22  ' GLUCOSE 117* 104* 105* 121* 104* 97 96  BUN 24* 42* 39* 47* 47* 47* 47*  CREATININE 1.42* 1.91* 1.41* 1.66* 1.63* 1.59* 1.58*  CALCIUM 9.3 9.1 9.4 9.3 9.0 9.2 9.0  MG 1.6* 1.8  --   1.9  --   --   --   PHOS 2.2* 2.6  --   --   --   --   --    Liver Function Tests: Recent Labs  Lab 04/01/19 0529 04/02/19 0432 04/03/19 0640  AST  --   --  35  ALT  --   --  18  ALKPHOS  --   --  57  BILITOT  --   --  0.7  PROT  --   --  6.0*  ALBUMIN 2.6* 2.4* 2.7*   No results for input(s): LIPASE, AMYLASE in the last 168 hours. No results for input(s): AMMONIA in the last 168 hours. Cardiac Enzymes: No results for input(s): CKTOTAL, CKMB, CKMBINDEX, TROPONINI in the last 168 hours. BNP (last 3 results) Recent Labs    03/25/19 1802  BNP 736.3*   CBG: Recent Labs  Lab 04/06/19 0647 04/06/19 1112 04/06/19 1628 04/06/19 2154 04/07/19 0555  GLUCAP 87 136* 95 107* 95   Time spent: 35 minutes  Signed:  Berle Mull  [facility]  04/07/2019

## 2019-04-07 NOTE — Progress Notes (Signed)
Physical Therapy Treatment Patient Details Name: Courtney Bradford MRN: [id number] DOB: [birth year] Today's Date: 04/07/2019    History of Present Illness Pt is an 85 y/o female admitted secondary to AMS. Found to have encephalopathy likely secondary to UTI. CT of head was negative for acute abnormality. Pt with elevated troponin, however, per notes, likely secondary to demand ischemia. PMH includes HTN, dHCF, a fib, DM, CKD, HTN, and gout.     PT Comments    Pt received in bed. With encouragement, pt agreeable to participation in therapy. She required mod assist bed mobility, mod assist +2 safety sit to stand, and max assist +2 safety stand pivot transfer bed to recliner. Mobility/participation limited by RUE pain. Pt positioned in recliner with feet elevated and R arm elevated on pillow. Current POC remains appropriate.    Follow Up Recommendations  SNF;Supervision/Assistance - 24 hour     Equipment Recommendations  None recommended by PT    Recommendations for Other Services       Precautions / Restrictions Precautions Precautions: Fall;Other (comment) Precaution Comments: RUE gout Restrictions RUE Weight Bearing: Non weight bearing    Mobility  Bed Mobility Overal bed mobility: Needs Assistance Bed Mobility: Supine to Sit;Rolling Rolling: Mod assist   Supine to sit: Mod assist;HOB elevated     General bed mobility comments: cues for sequencing, increased time and effort. Pt guarding RUE  Transfers Overall transfer level: Needs assistance Equipment used: None   Sit to Stand: +2 safety/equipment;Mod assist Stand pivot transfers: Max assist;+2 safety/equipment       General transfer comment: multi attempts to rise and stabilize balance, verbal/tactile cues for sequencing  Ambulation/Gait             General Gait Details: unable due to gout RUE (cannot use RW)   Stairs             Wheelchair Mobility    Modified Rankin (Stroke Patients Only)        Balance Overall balance assessment: Needs assistance Sitting-balance support: Feet supported;No upper extremity supported Sitting balance-Leahy Scale: Fair       Standing balance-Leahy Scale: Poor Standing balance comment: reliant on therapist support                            Cognition Arousal/Alertness: Awake/alert Behavior During Therapy: Flat affect Overall Cognitive Status: Impaired/Different from baseline Area of Impairment: Orientation;Memory;Following commands;Problem solving                 Orientation Level: Disoriented to;Time;Situation   Memory: Decreased recall of precautions;Decreased short-term memory Following Commands: Follows one step commands with increased time     Problem Solving: Slow processing;Decreased initiation;Difficulty sequencing;Requires verbal cues;Requires tactile cues General Comments: cotinuous verbal cues to stay on task, easily distracted by RUE pain      Exercises      General Comments        Pertinent Vitals/Pain Pain Assessment: Faces Faces Pain Scale: Hurts even more Pain Location: RUE with movement or touch Pain Descriptors / Indicators: Tender;Sore Pain Intervention(s): Monitored during session;Repositioned;Limited activity within patient's tolerance    Home Living                      Prior Function            PT Goals (current goals can now be found in the care plan section) Acute Rehab PT Goals Patient Stated Goal: R arm to  get better PT Goal Formulation: With patient Time For Goal Achievement: 04/15/19 Potential to Achieve Goals: Fair Progress towards PT goals: Progressing toward goals(slowly due to gout RUE)    Frequency    Min 3X/week      PT Plan Current plan remains appropriate    Co-evaluation              AM-PAC PT "6 Clicks" Mobility   Outcome Measure  Help needed turning from your back to your side while in a flat bed without using bedrails?: A  Lot Help needed moving from lying on your back to sitting on the side of a flat bed without using bedrails?: A Lot Help needed moving to and from a bed to a chair (including a wheelchair)?: A Lot Help needed standing up from a chair using your arms (e.g., wheelchair or bedside chair)?: A Lot Help needed to walk in hospital room?: Total Help needed climbing 3-5 steps with a railing? : Total 6 Click Score: 10    End of Session Equipment Utilized During Treatment: Gait belt Activity Tolerance: Patient limited by pain Patient left: in chair;with call bell/phone within reach;with chair alarm set Nurse Communication: Mobility status PT Visit Diagnosis: Other abnormalities of gait and mobility (R26.89);Muscle weakness (generalized) (M62.81);Unsteadiness on feet (R26.81)     Time: 1518-3437 PT Time Calculation (min) (ACUTE ONLY): 11 min  Charges:  $Therapeutic Activity: 8-22 mins                     Lorrin Goodell, PT  Office # [phone] Pager [phone]    Lorriane Shire 04/07/2019, 9:54 AM

## 2019-04-07 NOTE — TOC Transition Note (Addendum)
Transition of Care [facility]) - CM/SW Discharge Note   Patient Details  Name: Courtney Bradford MRN: [id number] Date of Birth: 09-09-1931  Transition of Care [facility]) CM/SW Contact:  Candie Chroman, LCSW Phone Number: 04/07/2019, 1:35 PM   Clinical Narrative: CSW facilitated patient discharge including contacting patient family and facility to confirm patient discharge plans. Clinical information faxed to facility and family agreeable with plan. CSW arranged ambulance transport via [facility] to [facility] at 5:00. RN to call report prior to discharge ([phone] Room 157).  CSW will sign off for now as social work intervention is no longer needed. Please consult [place] again if new needs arise.  Final next level of care: Skilled Nursing Facility Barriers to Discharge: Barriers Resolved   Patient Goals and CMS Choice Patient states their goals for this hospitalization and ongoing recovery are:: Patient not fully oriented. CMS Medicare.gov Compare Post Acute Care list provided to:: Patient Represenative (must comment)(son Ron) Choice offered to / list presented to : Adult Children  Discharge Placement   Existing PASRR number confirmed : 04/01/19          Patient chooses bed at: [facility] Patient to be transferred to facility by: [place] Name of family member notified: Sherle Poe Patient and family notified of of transfer: 04/07/19  Discharge Plan and Services In-house Referral: NA Discharge Planning Services: CM Consult Post Acute Care Choice: NA          DME Arranged: N/A DME Agency: NA                  Social Determinants of Health (SDOH) Interventions     Readmission Risk Interventions No flowsheet data found.

## 2019-04-07 NOTE — Progress Notes (Signed)
Report called to Nancy,Rn at [facility]  [phone](patient going to room [place] verbalized understanding of report given. Patient packed up and ready to go for ambulance transport to SNF. PIV removed with catheter intact before transport arrives. Son was informed via Education officer, museum of patients transport today.

## 2019-04-08 ENCOUNTER — Non-Acute Institutional Stay (SKILLED_NURSING_FACILITY): Payer: Medicare Other | Admitting: Adult Health

## 2019-04-08 ENCOUNTER — Encounter: Payer: Self-pay | Admitting: Adult Health

## 2019-04-08 ENCOUNTER — Other Ambulatory Visit: Payer: Self-pay | Admitting: Adult Health

## 2019-04-08 DIAGNOSIS — I130 Hypertensive heart and chronic kidney disease with heart failure and stage 1 through stage 4 chronic kidney disease, or unspecified chronic kidney disease: Secondary | ICD-10-CM | POA: Insufficient documentation

## 2019-04-08 DIAGNOSIS — I482 Chronic atrial fibrillation, unspecified: Secondary | ICD-10-CM

## 2019-04-08 DIAGNOSIS — N1330 Unspecified hydronephrosis: Secondary | ICD-10-CM

## 2019-04-08 DIAGNOSIS — N183 Chronic kidney disease, stage 3 unspecified: Secondary | ICD-10-CM | POA: Insufficient documentation

## 2019-04-08 DIAGNOSIS — M10331 Gout due to renal impairment, right wrist: Secondary | ICD-10-CM

## 2019-04-08 DIAGNOSIS — I5032 Chronic diastolic (congestive) heart failure: Secondary | ICD-10-CM | POA: Diagnosis not present

## 2019-04-08 DIAGNOSIS — D631 Anemia in chronic kidney disease: Secondary | ICD-10-CM

## 2019-04-08 DIAGNOSIS — N390 Urinary tract infection, site not specified: Secondary | ICD-10-CM

## 2019-04-08 DIAGNOSIS — E1122 Type 2 diabetes mellitus with diabetic chronic kidney disease: Secondary | ICD-10-CM | POA: Diagnosis not present

## 2019-04-08 DIAGNOSIS — Z794 Long term (current) use of insulin: Secondary | ICD-10-CM

## 2019-04-08 DIAGNOSIS — E43 Unspecified severe protein-calorie malnutrition: Secondary | ICD-10-CM

## 2019-04-08 MED ORDER — HYDROCODONE-ACETAMINOPHEN 5-325 MG PO TABS: 1.0000 | ORAL_TABLET | Freq: Four times a day (QID) | ORAL | 0 refills | Status: AC | PRN

## 2019-04-08 NOTE — Progress Notes (Signed)
Location:   [place] Room Number: 643 D Place of Service:  SNF (31)   CODE STATUS: Full Code  Allergies  Allergen Reactions  . Lactose Intolerance (Gi) Other (See Comments)    Upset stomach    Chief Complaint  Patient presents with  . Hospitalization Follow-up    hospital follow up    HPI:  She is a 85 year old woman who has been hospitalized from 03-25-19 through 04-07-19. She presented to the ED after vomiting and tarry stools. She was treated for acute encephalopathy, sepsis secondary to UTI and bacteremia secondary to e-coli and klebsiella. She has left hydroureteronephrosis she has 6 more days of augmentin. She treated for acute right wrist gout with colchicine now with long term daily treatment. She was given 3 days of prednisone. She will need to complete doxycycline for 6 more days. She has chronic stage 3 kidney disease. She is here for short term rehab with her goal to return back home. She denies any pain; she denies any insomnia or anxiety. She states that she has a good appetite. She will continue to be followed for her chronic illnesses including: chf; afib; diabetes.   Past Medical History:  Diagnosis Date  . A-fib ([place])   . Anemia   . Arthritis    right knee  . Cataract   . CHF (congestive heart failure) ([place])   . CKD (chronic kidney disease), stage III ([place])   . Dehydration   . Diabetes mellitus   . Gout   . Headache(784.0)   . Hypertension   . UTI (lower urinary tract infection)     Past Surgical History:  Procedure Laterality Date  . ABDOMINAL HYSTERECTOMY    . AMPUTATION FINGER Left 01/22/2019   Procedure: Amputation Left Index Finger First Joint;  Surgeon: Iran Planas, MD;  Location: [facility];  Service: Orthopedics;  Laterality: Left;  . ANTERIOR AND POSTERIOR REPAIR  05/17/2011   Procedure: ANTERIOR (CYSTOCELE) AND POSTERIOR REPAIR (RECTOCELE);  Surgeon: Eli Hose, MD;  Location: [facility];  Service: Gynecology;   Laterality: N/A;  Anterior repair with TVT bladder sling and cystoscopy  . BLADDER SUSPENSION  05/17/2011   Procedure: TRANSVAGINAL TAPE (TVT) PROCEDURE;  Surgeon: Eli Hose, MD;  Location: [facility];  Service: Gynecology;  Laterality: N/A;  . BREAST SURGERY     breast biopsy  . I&D EXTREMITY Left 01/22/2019   Procedure: IRRIGATION AND DEBRIDEMENT OF LEFT INDEX FINGER,;  Surgeon: Iran Planas, MD;  Location: [facility];  Service: Orthopedics;  Laterality: Left;  . IR FLUORO GUIDED NEEDLE PLC ASPIRATION/INJECTION LOC  04/05/2019    Social History   Socioeconomic History  . Marital status: Single    Spouse name: Not on file  . Number of children: Not on file  . Years of education: Not on file  . Highest education level: Not on file  Occupational History  . Not on file  Social Needs  . Financial resource strain: Not on file  . Food insecurity:    Worry: Not on file    Inability: Not on file  . Transportation needs:    Medical: Not on file    Non-medical: Not on file  Tobacco Use  . Smoking status: Never Smoker  . Smokeless tobacco: Never Used  Substance and Sexual Activity  . Alcohol use: No  . Drug use: No  . Sexual activity: Never    Birth control/protection: Abstinence  Lifestyle  . Physical activity:  Days per week: Not on file    Minutes per session: Not on file  . Stress: Not on file  Relationships  . Social connections:    Talks on phone: Not on file    Gets together: Not on file    Attends religious service: Not on file    Active member of club or organization: Not on file    Attends meetings of clubs or organizations: Not on file    Relationship status: Not on file  . Intimate partner violence:    Fear of current or ex partner: Not on file    Emotionally abused: Not on file    Physically abused: Not on file    Forced sexual activity: Not on file  Other Topics Concern  . Not on file  Social History Narrative  . Not on file   History reviewed. No  pertinent family history.    VITAL SIGNS BP (!) 156/60   Pulse 73   Temp 98.9 F (37.2 C)   Resp 20   Ht 5\' 6"  (1.676 m)   Wt 149 lb (67.6 kg)   BMI 24.05 kg/m   Outpatient Encounter Medications as of 04/08/2019  Medication Sig  . acetaminophen (TYLENOL) 500 MG tablet Take 500 mg by mouth every 6 (six) hours as needed for mild pain.   Marland Kitchen amLODipine (NORVASC) 2.5 MG tablet Take 1 tablet (2.5 mg total) by mouth daily.  Marland Kitchen amoxicillin-clavulanate (AUGMENTIN) 500-125 MG tablet Take 1 tablet (500 mg total) by mouth 2 (two) times a day for 6 days.  Marland Kitchen apixaban (ELIQUIS) 2.5 MG TABS tablet Take 2.5 mg by mouth 2 (two) times daily.  . colchicine 0.6 MG tablet Take 0.5 tablets (0.3 mg total) by mouth daily.  Marland Kitchen doxycycline (VIBRA-TABS) 100 MG tablet Take 1 tablet (100 mg total) by mouth 2 (two) times daily for 6 days.  . feeding supplement, GLUCERNA SHAKE, (GLUCERNA SHAKE) LIQD Take 237 mLs by mouth 3 (three) times daily between meals.  . ferrous fumarate (HEMOCYTE - 106 MG FE) 325 (106 FE) MG TABS tablet Take 1 tablet by mouth every morning.   Marland Kitchen HYDROcodone-acetaminophen (NORCO/VICODIN) 5-325 MG tablet Take 1-2 tablets by mouth every 6 (six) hours as needed.  . Insulin NPH, Human,, Isophane, (HUMULIN N KWIKPEN) 100 UNIT/ML Kiwkpen Inject 30 Units into the skin daily. Give at 8:00 am  . NON FORMULARY Diet type:  NAS, low potassium diet  . olopatadine (PATANOL) 0.1 % ophthalmic solution Place 1 drop into both eyes 2 (two) times daily.  Marland Kitchen senna-docusate (SENOKOT-S) 8.6-50 MG tablet Take 2 tablets by mouth at bedtime as needed for mild constipation or moderate constipation.   No facility-administered encounter medications on file as of 04/08/2019.      SIGNIFICANT DIAGNOSTIC EXAMS  TODAY:   03-25-19: chest x-ray: no active disease  03-25-19: ct of abdomen and pelvis:  1. Extensive colonic diverticulosis without evidence to suggest an acute diverticulitis at this time. 2. Mild left  hydroureteronephrosis terminating in the proximal third of the left ureter. No obstructing stone identified. The possibility of a ureteral stricture could be considered. 3. Small amount of gas non dependently in the urinary bladder. This may be iatrogenic if there has been recent catheterization. In the absence of catheterization, correlation with urinalysis is suggested to exclude the possibility of urinary tract infection with gas-forming organisms. 4. Aortic atherosclerosis. 5. Mild cardiomegaly. 6. Trace amount of biliary sludge and/or tiny gallstones lying dependently in the gallbladder. No findings to  suggest an acute cholecystitis at this time.  03-25-19: ct of head: chronic atrophy and ischemic changes without acute abnormality.   04-02-19: right wrist x-ray: chronic changes without acute abnormality   LABS REVIEWED TODAY:   03-25-19: wbc 7.9; hgb 9.2; hct 29.5; mcv 90.8 plt 174; glucose 82; bun 45; creat 1.57; k+ 3.7; na++ 144; ca 9.4; liver normal albumin 3.6 BNP 736.3 blood culture: e-coli/enterobacteriaceae species urine culture: e-coli klebsiella pneumoniae  03-28-19" wbc 7.4; hgb 8.4; ht 26.5; mcv 90.8; plt 171 glucose 79; bun 36; creat 1.37; k+ 4.6; na++ 141; ca 9.5  03-30-19: wbc 8.9; hgb 8.9; hct 28.2; mcv 87.9 ;plt 208 glucose 116; bun 24; creat 1.26; k+ 4.2; na++ 135; ca 9.3 phos 2.0; albumin 3.1 mag 1.5  04-01-19: wbc 13.3; hgb 8.7; hct 27.2; mcv 87.2; plt 269; glucose 117; bun 24; creat 1.42; k+ 4.3; na++ 135; ca 9.3 phos 2.2; albumin 2.6; mag 1.6 04-03-19: wbc 8.3; hgb 8.8; hct 28.3; mcv 89.0; plt 414; glucose 105; bun 39; creat 1.41; k+ 4.8; na++ 133; ca 9.4 liver normal albumin 2.7 04-04-19: mag 1.9   04-06-19: glucose 97; bun 47; creat 1.59; k+ 4.7; na++ 137; ca 9.2 04-07-19: glucose 96; bun 47; creat 1.58; k+ 4.7 na++ 137; ca 9.0   Review of Systems  Constitutional: Negative for malaise/fatigue.  Respiratory: Negative for cough and shortness of breath.   Cardiovascular: Negative for  chest pain, palpitations and leg swelling.  Gastrointestinal: Negative for abdominal pain, constipation and heartburn.  Musculoskeletal: Negative for back pain, joint pain and myalgias.  Skin: Negative.   Neurological: Negative for dizziness.  Psychiatric/Behavioral: The patient is not nervous/anxious.    Physical Exam Constitutional:      General: She is not in acute distress.    Appearance: She is well-developed. She is not diaphoretic.  Neck:     Musculoskeletal: Neck supple.     Thyroid: No thyromegaly.  Cardiovascular:     Rate and Rhythm: Normal rate and regular rhythm.     Pulses: Normal pulses.     Heart sounds: Murmur present.     Comments: 2/6 Pulmonary:     Effort: Pulmonary effort is normal. No respiratory distress.     Breath sounds: Normal breath sounds.  Abdominal:     General: Bowel sounds are normal. There is no distension.     Palpations: Abdomen is soft.     Tenderness: There is no abdominal tenderness.  Musculoskeletal:     Right lower leg: No edema.     Left lower leg: No edema.     Comments: Is able to move all extremities Status post left index finger amputation  Lymphadenopathy:     Cervical: No cervical adenopathy.  Skin:    General: Skin is warm and dry.     Comments: Bilateral lower extremities discolored   Neurological:     Mental Status: She is alert. Mental status is at baseline.  Psychiatric:        Mood and Affect: Mood normal.      ASSESSMENT/ PLAN:  TODAY:   1. Chronic diastolic CHF (congestive heart failure)/demand ischemia  EF 50-55% (01-23-19): is stable  Will continue to monitor her status.   2. Atrial fibrillation chronic: heart rate is stable will continue eliquis 2.5 mg twice daily   3. Hypertensive heart and kidney disease with chronic diastolic congestive heart failure and stage 3 chronic kidney disease: is stable b/p 156/60: will continue norvasc 2.5 mg daily  4. Controlled type  2 diabetes mellitus with stage 3 chronic  kidney disease with long term current use of insulin: is stable will continue NPH 30 units daily   5. Stage 3 chronic kidney disease due to type 2 diabetes mellitus/hydroureteronephosis: is without change: bun 47; creat 1.58  6. Anemia due to stage 3 chronic kidney disease: is without change hgb 8.8; will continue hemocyte daily   7. Lower urinary tract infectious disease: is stable will complete augmentin 500 mg twice daily for 6 days.   8. Acute gout due to renal impairment involving right wrist: is stable will continue colchicine 0.3 mg daily and will complete doxycycline 100 mg twice daily for 6 days.   9. Protein calorie malnutrition severe: is stable albumin 2.7 will continue glucerna three times daily  10. Hypomagnesemia: is stable mag 1.9; will monitor          MD is aware of resident's narcotic use and is in agreement with current plan of care. We will attempt to wean resident as apropriate   Ok Edwards NP [facility]  Contact [phone] Monday through Friday 8am- 5pm  After hours call [phone]

## 2019-04-09 ENCOUNTER — Encounter: Payer: Self-pay | Admitting: Adult Health

## 2019-04-09 ENCOUNTER — Non-Acute Institutional Stay (SKILLED_NURSING_FACILITY): Payer: Medicare Other | Admitting: Adult Health

## 2019-04-09 DIAGNOSIS — M10331 Gout due to renal impairment, right wrist: Secondary | ICD-10-CM

## 2019-04-09 DIAGNOSIS — E1122 Type 2 diabetes mellitus with diabetic chronic kidney disease: Secondary | ICD-10-CM | POA: Diagnosis not present

## 2019-04-09 DIAGNOSIS — N183 Chronic kidney disease, stage 3 unspecified: Secondary | ICD-10-CM

## 2019-04-09 DIAGNOSIS — I5032 Chronic diastolic (congestive) heart failure: Secondary | ICD-10-CM | POA: Diagnosis not present

## 2019-04-09 DIAGNOSIS — I130 Hypertensive heart and chronic kidney disease with heart failure and stage 1 through stage 4 chronic kidney disease, or unspecified chronic kidney disease: Secondary | ICD-10-CM

## 2019-04-09 LAB — BODY FLUID CULTURE: Culture: NO GROWTH

## 2019-04-09 NOTE — Progress Notes (Signed)
Location:   [facility] Room Number: 101 D Place of Service:  SNF (31)   CODE STATUS: Full Code  Allergies  Allergen Reactions  . Lactose Intolerance (Gi) Other (See Comments)    Upset stomach    Chief Complaint  Patient presents with  . Acute Visit    72 Hour Care Plan Meeting    HPI:  We have come together for her 68 hour care plan meeting. Her goal is to return back home with her son. He does provide her adl care. She does have a CNA come in 5 times weekly. She was ambulating at home. She continues to have significant pain in her right wrist. There is some swelling present. She does not want anyone to touch her wrist. There are no reports of fevers present.   Past Medical History:  Diagnosis Date  . A-fib ([place])   . Anemia   . Arthritis    right knee  . Cataract   . CHF (congestive heart failure) ([place])   . CKD (chronic kidney disease), stage III ([place])   . Dehydration   . Diabetes mellitus   . Gout   . Headache(784.0)   . Hypertension   . UTI (lower urinary tract infection)     Past Surgical History:  Procedure Laterality Date  . ABDOMINAL HYSTERECTOMY    . AMPUTATION FINGER Left 01/22/2019   Procedure: Amputation Left Index Finger First Joint;  Surgeon: Iran Planas, MD;  Location: [facility];  Service: Orthopedics;  Laterality: Left;  . ANTERIOR AND POSTERIOR REPAIR  05/17/2011   Procedure: ANTERIOR (CYSTOCELE) AND POSTERIOR REPAIR (RECTOCELE);  Surgeon: Eli Hose, MD;  Location: [facility];  Service: Gynecology;  Laterality: N/A;  Anterior repair with TVT bladder sling and cystoscopy  . BLADDER SUSPENSION  05/17/2011   Procedure: TRANSVAGINAL TAPE (TVT) PROCEDURE;  Surgeon: Eli Hose, MD;  Location: [facility];  Service: Gynecology;  Laterality: N/A;  . BREAST SURGERY     breast biopsy  . I&D EXTREMITY Left 01/22/2019   Procedure: IRRIGATION AND DEBRIDEMENT OF LEFT INDEX FINGER,;  Surgeon: Iran Planas, MD;  Location: [facility];   Service: Orthopedics;  Laterality: Left;  . IR FLUORO GUIDED NEEDLE PLC ASPIRATION/INJECTION LOC  04/05/2019    Social History   Socioeconomic History  . Marital status: Single    Spouse name: Not on file  . Number of children: Not on file  . Years of education: Not on file  . Highest education level: Not on file  Occupational History  . Not on file  Social Needs  . Financial resource strain: Not on file  . Food insecurity:    Worry: Not on file    Inability: Not on file  . Transportation needs:    Medical: Not on file    Non-medical: Not on file  Tobacco Use  . Smoking status: Never Smoker  . Smokeless tobacco: Never Used  Substance and Sexual Activity  . Alcohol use: No  . Drug use: No  . Sexual activity: Never    Birth control/protection: Abstinence  Lifestyle  . Physical activity:    Days per week: Not on file    Minutes per session: Not on file  . Stress: Not on file  Relationships  . Social connections:    Talks on phone: Not on file    Gets together: Not on file    Attends religious service: Not on file    Active member of club or  organization: Not on file    Attends meetings of clubs or organizations: Not on file    Relationship status: Not on file  . Intimate partner violence:    Fear of current or ex partner: Not on file    Emotionally abused: Not on file    Physically abused: Not on file    Forced sexual activity: Not on file  Other Topics Concern  . Not on file  Social History Narrative  . Not on file   History reviewed. No pertinent family history.    VITAL SIGNS BP 138/70   Pulse 78   Temp 98.4 F (36.9 C)   Resp 18   Ht 5\' 6"  (1.676 m)   Wt 150 lb 9.6 oz (68.3 kg)   BMI 24.31 kg/m   Outpatient Encounter Medications as of 04/09/2019  Medication Sig  . acetaminophen (TYLENOL) 500 MG tablet Take 500 mg by mouth every 6 (six) hours as needed for mild pain.   Marland Kitchen amLODipine (NORVASC) 2.5 MG tablet Take 1 tablet (2.5 mg total) by mouth daily.   Marland Kitchen amoxicillin-clavulanate (AUGMENTIN) 500-125 MG tablet Take 1 tablet (500 mg total) by mouth 2 (two) times a day for 6 days.  Marland Kitchen apixaban (ELIQUIS) 2.5 MG TABS tablet Take 2.5 mg by mouth 2 (two) times daily.  . colchicine 0.6 MG tablet Take 0.5 tablets (0.3 mg total) by mouth daily.  Marland Kitchen doxycycline (VIBRA-TABS) 100 MG tablet Take 1 tablet (100 mg total) by mouth 2 (two) times daily for 6 days.  . feeding supplement, GLUCERNA SHAKE, (GLUCERNA SHAKE) LIQD Take 237 mLs by mouth 3 (three) times daily between meals.  . ferrous fumarate (HEMOCYTE - 106 MG FE) 325 (106 FE) MG TABS tablet Take 1 tablet by mouth every morning.   Marland Kitchen HYDROcodone-acetaminophen (NORCO/VICODIN) 5-325 MG tablet Take 1-2 tablets by mouth every 6 (six) hours as needed for up to 7 days.  . Insulin NPH, Human,, Isophane, (HUMULIN N KWIKPEN) 100 UNIT/ML Kiwkpen Inject 30 Units into the skin daily. Give at 8:00 am  . NON FORMULARY Diet type:  NAS, low potassium diet  . olopatadine (PATANOL) 0.1 % ophthalmic solution Place 1 drop into both eyes 2 (two) times daily.  . sennosides-docusate sodium (SENOKOT-S) 8.6-50 MG tablet Take 2 tablets by mouth at bedtime.  . [DISCONTINUED] senna-docusate (SENOKOT-S) 8.6-50 MG tablet Take 2 tablets by mouth at bedtime as needed for mild constipation or moderate constipation. (Patient not taking: Reported on 04/09/2019)   No facility-administered encounter medications on file as of 04/09/2019.      SIGNIFICANT DIAGNOSTIC EXAMS  PREVIOUS:   03-25-19: chest x-ray: no active disease  03-25-19: ct of abdomen and pelvis:  1. Extensive colonic diverticulosis without evidence to suggest an acute diverticulitis at this time. 2. Mild left hydroureteronephrosis terminating in the proximal third of the left ureter. No obstructing stone identified. The possibility of a ureteral stricture could be considered. 3. Small amount of gas non dependently in the urinary bladder. This may be iatrogenic if there has  been recent catheterization. In the absence of catheterization, correlation with urinalysis is suggested to exclude the possibility of urinary tract infection with gas-forming organisms. 4. Aortic atherosclerosis. 5. Mild cardiomegaly. 6. Trace amount of biliary sludge and/or tiny gallstones lying dependently in the gallbladder. No findings to suggest an acute cholecystitis at this time.  03-25-19: ct of head: chronic atrophy and ischemic changes without acute abnormality.   04-02-19: right wrist x-ray: chronic changes without acute abnormality  NO NEW EXAMS.   LABS REVIEWED PREVIOUS:   03-25-19: wbc 7.9; hgb 9.2; hct 29.5; mcv 90.8 plt 174; glucose 82; bun 45; creat 1.57; k+ 3.7; na++ 144; ca 9.4; liver normal albumin 3.6 BNP 736.3 blood culture: e-coli/enterobacteriaceae species urine culture: e-coli klebsiella pneumoniae  03-28-19" wbc 7.4; hgb 8.4; ht 26.5; mcv 90.8; plt 171 glucose 79; bun 36; creat 1.37; k+ 4.6; na++ 141; ca 9.5  03-30-19: wbc 8.9; hgb 8.9; hct 28.2; mcv 87.9 ;plt 208 glucose 116; bun 24; creat 1.26; k+ 4.2; na++ 135; ca 9.3 phos 2.0; albumin 3.1 mag 1.5  04-01-19: wbc 13.3; hgb 8.7; hct 27.2; mcv 87.2; plt 269; glucose 117; bun 24; creat 1.42; k+ 4.3; na++ 135; ca 9.3 phos 2.2; albumin 2.6; mag 1.6 04-03-19: wbc 8.3; hgb 8.8; hct 28.3; mcv 89.0; plt 414; glucose 105; bun 39; creat 1.41; k+ 4.8; na++ 133; ca 9.4 liver normal albumin 2.7 04-04-19: mag 1.9   04-06-19: glucose 97; bun 47; creat 1.59; k+ 4.7; na++ 137; ca 9.2 04-07-19: glucose 96; bun 47; creat 1.58; k+ 4.7 na++ 137; ca 9.0   NO NEW LABS.   Review of Systems  Constitutional: Negative for malaise/fatigue.  Respiratory: Negative for cough and shortness of breath.   Cardiovascular: Negative for chest pain, palpitations and leg swelling.  Gastrointestinal: Negative for abdominal pain, constipation and heartburn.  Musculoskeletal: Positive for joint pain. Negative for back pain and myalgias.       Right wrist pain    Skin: Negative.   Neurological: Negative for dizziness.  Psychiatric/Behavioral: The patient is not nervous/anxious.     Physical Exam Constitutional:      General: She is not in acute distress.    Appearance: She is well-developed. She is not diaphoretic.  Neck:     Thyroid: No thyromegaly.  Cardiovascular:     Rate and Rhythm: Normal rate and regular rhythm.     Pulses: Normal pulses.     Heart sounds: Murmur present.     Comments: 2/6 Pulmonary:     Effort: Pulmonary effort is normal. No respiratory distress.     Breath sounds: Normal breath sounds.  Abdominal:     General: Bowel sounds are normal. There is no distension.     Palpations: Abdomen is soft.     Tenderness: There is no abdominal tenderness.  Musculoskeletal:     Right lower leg: No edema.     Left lower leg: No edema.     Comments: Is able to move all extremities Status post left index finger amputation  Right wrist is slightly swollen; without warmth; is not willing to move her wrist.   Lymphadenopathy:     Cervical: No cervical adenopathy.  Skin:    General: Skin is warm and dry.     Comments: Bilateral lower extremities discolored   Neurological:     Mental Status: She is alert. Mental status is at baseline.  Psychiatric:        Mood and Affect: Mood normal.     ASSESSMENT/ PLAN:  TODAY:   1. Acute gout due to renal impairment involving right wrist 2. Stage 3 chronic kidney disease due to type 2 diabetes mellitus 3. Hypertensive heart and kidney disease with chronic diastolic congestive heart failure and stage 3 chronic kidney disease.  Will continue therapy as directed Will begin her on prednisone 20 mg daily from 04-10-19 through 04-14-19.  Will monitor her status.      MD is aware of resident's narcotic  use and is in agreement with current plan of care. We will attempt to wean resident as apropriate   Ok Edwards NP [facility]  Contact [phone] Monday through Friday  8am- 5pm  After hours call [phone]

## 2019-04-10 ENCOUNTER — Encounter: Payer: Self-pay | Admitting: Internal Medicine

## 2019-04-10 ENCOUNTER — Non-Acute Institutional Stay (SKILLED_NURSING_FACILITY): Payer: Medicare Other | Admitting: Internal Medicine

## 2019-04-10 DIAGNOSIS — I482 Chronic atrial fibrillation, unspecified: Secondary | ICD-10-CM

## 2019-04-10 DIAGNOSIS — N1330 Unspecified hydronephrosis: Secondary | ICD-10-CM

## 2019-04-10 DIAGNOSIS — E1122 Type 2 diabetes mellitus with diabetic chronic kidney disease: Secondary | ICD-10-CM

## 2019-04-10 DIAGNOSIS — N183 Chronic kidney disease, stage 3 unspecified: Secondary | ICD-10-CM

## 2019-04-10 DIAGNOSIS — I10 Essential (primary) hypertension: Secondary | ICD-10-CM

## 2019-04-10 DIAGNOSIS — Z794 Long term (current) use of insulin: Secondary | ICD-10-CM

## 2019-04-10 DIAGNOSIS — M10331 Gout due to renal impairment, right wrist: Secondary | ICD-10-CM

## 2019-04-10 DIAGNOSIS — I5032 Chronic diastolic (congestive) heart failure: Secondary | ICD-10-CM

## 2019-04-10 DIAGNOSIS — F0390 Unspecified dementia without behavioral disturbance: Secondary | ICD-10-CM

## 2019-04-10 DIAGNOSIS — D631 Anemia in chronic kidney disease: Secondary | ICD-10-CM

## 2019-04-10 NOTE — Progress Notes (Signed)
Provider:  Veleta Miners, MD Location:  [facility] Room Number: 157 D Place of Service:  SNF (31)  PCP: Iona Beard, MD Patient Care Team: Iona Beard, MD as PCP - General [facility])  Extended Emergency Contact Information Primary Emergency Contact: [facility] Address: [place], [place] [address] [place] Work Phone: [phone] Mobile Phone: [phone] Relation: Son Secondary Emergency Contact: Simmons,Mrytle  [place] Phone: [phone] Relation: Sister  Code Status: Full Code Goals of Care: Advanced Directive information Advanced Directives 04/10/2019  Does Patient Have a Medical Advance Directive? No  Type of Advance Directive -  Does patient want to make changes to medical advance directive? -  Copy of [place] in Chart? -  Would patient like information on creating a medical advance directive? No - Patient declined  Pre-existing out of facility DNR order (yellow form or pink MOST form) -      Chief Complaint  Patient presents with  . New Admit To SNF    Admission    HPI: Patient is a 85 y.o. female seen today for admission to SNF for therapy Patient was admitted to hospital from 5/26-6/8 for Urosepsis and Acute Gout.  Patient has h/o Hypertension , Gout, Diabetes Mellitus, Gout, CKD , Diastolic CHF last echo EF of 50% in 3/20 with Moderate AR CKD stage 3, Atrial Fibrillation on Eliquis and recent Finger amputation due to Severe Infection, Followed by Bradycardia due to Beta Blockers, Anemia And Cognitive impairment  Patient was admitted with Fever and Acute Metabolic Encephalopathy. Her Blood cultures came positive for E Coli. Urine culture was positive for Ecoli and Klebsiella.  Her CT scan showed Left Hydroureteronephrosis without any Obstruction. She was discharged on PO antibiotics.  She also Had Acute right wrist Gout . It was aspirated by  Ortho to rule out Septic arthritis. She is now in SNF for therapy She has Baseline Cognitive impairment. Unable to give me any detail history. She lives with her son and has caregivers at home She did not have any acute complains but had obvious Pain and swelling in her right Wrist. No Chest Pian Or SOB    Past Medical History:  Diagnosis Date  . A-fib ([place])   . Anemia   . Arthritis    right knee  . Cataract   . CHF (congestive heart failure) ([place])   . CKD (chronic kidney disease), stage III ([place])   . Dehydration   . Diabetes mellitus   . Gout   . Headache(784.0)   . Hypertension   . UTI (lower urinary tract infection)    Past Surgical History:  Procedure Laterality Date  . ABDOMINAL HYSTERECTOMY    . AMPUTATION FINGER Left 01/22/2019   Procedure: Amputation Left Index Finger First Joint;  Surgeon: Iran Planas, MD;  Location: [facility];  Service: Orthopedics;  Laterality: Left;  . ANTERIOR AND POSTERIOR REPAIR  05/17/2011   Procedure: ANTERIOR (CYSTOCELE) AND POSTERIOR REPAIR (RECTOCELE);  Surgeon: Eli Hose, MD;  Location: [facility];  Service: Gynecology;  Laterality: N/A;  Anterior repair with TVT bladder sling and cystoscopy  . BLADDER SUSPENSION  05/17/2011   Procedure: TRANSVAGINAL TAPE (TVT) PROCEDURE;  Surgeon: Eli Hose, MD;  Location: [facility];  Service: Gynecology;  Laterality: N/A;  . BREAST SURGERY     breast biopsy  . I&D EXTREMITY Left 01/22/2019   Procedure: IRRIGATION AND DEBRIDEMENT  OF LEFT INDEX FINGER,;  Surgeon: Iran Planas, MD;  Location: [facility];  Service: Orthopedics;  Laterality: Left;  . IR FLUORO GUIDED NEEDLE PLC ASPIRATION/INJECTION LOC  04/05/2019    reports that she has never smoked. She has never used smokeless tobacco. She reports that she does not drink alcohol or use drugs. Social History   Socioeconomic History  . Marital status: Single    Spouse name: Not on file  . Number of children: Not on file  . Years of education: Not on file   . Highest education level: Not on file  Occupational History  . Not on file  Social Needs  . Financial resource strain: Not on file  . Food insecurity    Worry: Not on file    Inability: Not on file  . Transportation needs    Medical: Not on file    Non-medical: Not on file  Tobacco Use  . Smoking status: Never Smoker  . Smokeless tobacco: Never Used  Substance and Sexual Activity  . Alcohol use: No  . Drug use: No  . Sexual activity: Never    Birth control/protection: Abstinence  Lifestyle  . Physical activity    Days per week: Not on file    Minutes per session: Not on file  . Stress: Not on file  Relationships  . Social Herbalist on phone: Not on file    Gets together: Not on file    Attends religious service: Not on file    Active member of club or organization: Not on file    Attends meetings of clubs or organizations: Not on file    Relationship status: Not on file  . Intimate partner violence    Fear of current or ex partner: Not on file    Emotionally abused: Not on file    Physically abused: Not on file    Forced sexual activity: Not on file  Other Topics Concern  . Not on file  Social History Narrative  . Not on file    Functional Status Survey:    History reviewed. No pertinent family history.  Health Maintenance  Topic Date Due  . FOOT EXAM  05/08/2019 (Originally 09/23/1941)  . HEMOGLOBIN A1C  05/08/2019 (Originally 05/10/2016)  . OPHTHALMOLOGY EXAM  05/08/2019 (Originally 09/23/1941)  . URINE MICROALBUMIN  05/08/2019 (Originally 09/23/1941)  . DEXA SCAN  05/08/2019 (Originally 09/22/1998)  . TETANUS/TDAP  05/08/2019 (Originally 09/22/1950)  . PNA vac Low Risk Adult (1 of 2 - PCV13) 05/08/2019 (Originally 09/22/1998)  . INFLUENZA VACCINE  05/31/2019    Allergies  Allergen Reactions  . Lactose Intolerance (Gi) Other (See Comments)    Upset stomach    Outpatient Encounter Medications as of 04/10/2019  Medication Sig  .  acetaminophen (TYLENOL) 500 MG tablet Take 500 mg by mouth every 6 (six) hours as needed for mild pain.   Marland Kitchen amLODipine (NORVASC) 2.5 MG tablet Take 1 tablet (2.5 mg total) by mouth daily.  Marland Kitchen amoxicillin-clavulanate (AUGMENTIN) 500-125 MG tablet Take 1 tablet (500 mg total) by mouth 2 (two) times a day for 6 days.  Marland Kitchen apixaban (ELIQUIS) 2.5 MG TABS tablet Take 2.5 mg by mouth 2 (two) times daily.  . colchicine 0.6 MG tablet Take 0.5 tablets (0.3 mg total) by mouth daily.  Marland Kitchen doxycycline (VIBRA-TABS) 100 MG tablet Take 1 tablet (100 mg total) by mouth 2 (two) times daily for 6 days.  . feeding supplement, GLUCERNA SHAKE, (GLUCERNA SHAKE) LIQD Take 237 mLs  by mouth 3 (three) times daily between meals.  . ferrous fumarate (HEMOCYTE - 106 MG FE) 325 (106 FE) MG TABS tablet Take 1 tablet by mouth every morning.   Marland Kitchen HYDROcodone-acetaminophen (NORCO/VICODIN) 5-325 MG tablet Take 1-2 tablets by mouth every 6 (six) hours as needed for up to 7 days.  . Insulin NPH, Human,, Isophane, (HUMULIN N KWIKPEN) 100 UNIT/ML Kiwkpen Inject 30 Units into the skin daily. Give at 8:00 am  . NON FORMULARY Diet type:  NAS, low potassium diet  . olopatadine (PATANOL) 0.1 % ophthalmic solution Place 1 drop into both eyes 2 (two) times daily.  . predniSONE (DELTASONE) 20 MG tablet Take 20 mg by mouth daily with breakfast.  . sennosides-docusate sodium (SENOKOT-S) 8.6-50 MG tablet Take 2 tablets by mouth at bedtime.   No facility-administered encounter medications on file as of 04/10/2019.     Review of Systems  Constitutional: Negative.   HENT: Negative.   Respiratory: Negative.   Cardiovascular: Positive for leg swelling.  Gastrointestinal: Negative.   Genitourinary: Negative.   Musculoskeletal: Positive for arthralgias and myalgias.  Neurological: Positive for weakness.  Psychiatric/Behavioral: Positive for confusion. The patient is nervous/anxious.   All other systems reviewed and are negative.   Vitals:    04/10/19 0919  BP: (!) 125/59  Pulse: 69  Resp: 17  Temp: 98.9 F (37.2 C)  Weight: 150 lb 9.6 oz (68.3 kg)  Height: 5\' 6"  (1.676 m)   Body mass index is 24.31 kg/m. Physical Exam Vitals signs reviewed.  Constitutional:      Appearance: Normal appearance.  HENT:     Head: Normocephalic.     Nose: Nose normal.     Mouth/Throat:     Mouth: Mucous membranes are moist.     Pharynx: Oropharynx is clear.  Eyes:     Pupils: Pupils are equal, round, and reactive to light.  Neck:     Musculoskeletal: Neck supple.  Cardiovascular:     Rate and Rhythm: Normal rate. Rhythm irregular.     Pulses: Normal pulses.     Heart sounds: Murmur present.  Abdominal:     General: Abdomen is flat. Bowel sounds are normal. There is no distension.     Palpations: Abdomen is soft.     Tenderness: There is no abdominal tenderness.  Musculoskeletal:     Comments: Moderate Edema Bilateral  Had swelling and Pain in Her Right Wrist  Skin:    General: Skin is warm and dry.  Neurological:     General: No focal deficit present.     Mental Status: She is alert.     Comments: Not oriented to Time or Place  Psychiatric:        Mood and Affect: Mood normal.        Thought Content: Thought content normal.        Judgment: Judgment normal.     Labs reviewed: Basic Metabolic Panel: Recent Labs    03/31/19 0619 04/01/19 0529 04/02/19 0432  04/04/19 0423 04/05/19 0524 04/06/19 0501 04/07/19 0504  NA 135 135 133*   < > 134* 138 137 137  K 4.0 4.3 4.8   < > 5.0 5.1 4.7 4.7  CL 102 99 98   < > 101 104 104 105  CO2 22 25 23    < > 22 24 22 22   GLUCOSE 122* 117* 104*   < > 121* 104* 97 96  BUN 18 24* 42*   < > 47* 47* 47* 47*  CREATININE 1.23* 1.42* 1.91*   < > 1.66* 1.63* 1.59* 1.58*  CALCIUM 9.2 9.3 9.1   < > 9.3 9.0 9.2 9.0  MG 1.4* 1.6* 1.8  --  1.9  --   --   --   PHOS 2.2* 2.2* 2.6  --   --   --   --   --    < > = values in this interval not displayed.   Liver Function Tests: Recent Labs     01/25/19 0416 03/25/19 1802  04/01/19 0529 04/02/19 0432 04/03/19 0640  AST 19 23  --   --   --  35  ALT 9 12  --   --   --  18  ALKPHOS 63 70  --   --   --  57  BILITOT 0.6 1.0  --   --   --  0.7  PROT 6.7 6.8  --   --   --  6.0*  ALBUMIN 2.9* 3.6   < > 2.6* 2.4* 2.7*   < > = values in this interval not displayed.   Recent Labs    03/25/19 1822  LIPASE 24   No results for input(s): AMMONIA in the last 8760 hours. CBC: Recent Labs    03/31/19 0619 04/01/19 0529 04/02/19 0432 04/03/19 0640  WBC 12.8* 13.3* 10.4 8.3  NEUTROABS 10.2* 10.5* 7.2  --   HGB 9.3* 8.7* 8.4* 8.8*  HCT 28.8* 27.2* 26.1* 28.3*  MCV 87.3 87.2 87.3 89.0  PLT 228 269 335 414*   Cardiac Enzymes: Recent Labs    03/25/19 1822 03/26/19 0340  TROPONINI 0.06* 0.11*   BNP: Invalid input(s): POCBNP Lab Results  Component Value Date   HGBA1C 5.3 11/11/2015   Lab Results  Component Value Date   TSH 1.140 07/22/2014   Lab Results  Component Value Date   VITAMINB12 202 01/24/2019   Lab Results  Component Value Date   FOLATE 8.1 01/24/2019   Lab Results  Component Value Date   IRON 35 01/24/2019   TIBC 153 (L) 01/24/2019   FERRITIN 387 (H) 01/24/2019    Imaging and Procedures obtained prior to SNF admission: No results found.  Assessment/Plan Urosepsis On Augmentin and Doxycycline  CKD (chronic kidney disease), stage III ([place]) - Plan:  Creat is stable Repeat BMP Will Need Renal Consult  Hydroureteronephrosis - Plan: No Obstruction Continue to follow Clnically   Acute gout  involving right wrist - Plan:  Was restarted on Prednisone as her hand is swollen again Check Uric Acid Level and start her on Allopurinol low dose 50 mg and slowly taper up  Controlled type 2 diabetes mellitus with stage 3 chronic kidney disease, with long-term current use of insulin ([place]) - Plan:  BS are staying stable on NPH Less then 150 mostly Recheck A1C  Atrial fibrillation, chronic ([place]) -  Plan:  On Eliquis Rate COntrol   Chronic diastolic CHF (congestive heart failure) ([place]) - Plan:  Has edema Bilateral Restart her on Lasix 3/Week as per her Home dose Repeat BMP   Essential hypertension - Plan:  Stable on Norvasc  Anemia due to stage 3 chronic kidney disease ([place]) - Plan: Repeat CBC On Iron.   Dementia without behavioral disturbance,  - Plan:  Patient has Caregiver in her house with her son  plan for discharge home  S/P Finger Amputation in 03/20 for Infection Healed well    Family/ staff Communication:   Labs/tests ordered: BMP,CBC, Uric Acid Level, A1C  Total time spent in this patient care encounter was  45_  minutes; greater than 50% of the visit spent counseling patient and staff, reviewing records , Labs and coordinating care for problems addressed at this encounter.

## 2019-04-11 ENCOUNTER — Encounter: Payer: Self-pay | Admitting: Adult Health

## 2019-04-11 ENCOUNTER — Encounter (HOSPITAL_COMMUNITY)
Admission: RE | Admit: 2019-04-11 | Discharge: 2019-04-11 | Disposition: A | Discharge status: Home / Self Care | Payer: Medicare Other | Source: Skilled Nursing Facility | Attending: Internal Medicine | Admitting: Internal Medicine

## 2019-04-11 ENCOUNTER — Non-Acute Institutional Stay (SKILLED_NURSING_FACILITY): Payer: Medicare Other | Admitting: Adult Health

## 2019-04-11 DIAGNOSIS — N183 Chronic kidney disease, stage 3 unspecified: Secondary | ICD-10-CM

## 2019-04-11 DIAGNOSIS — M10331 Gout due to renal impairment, right wrist: Secondary | ICD-10-CM | POA: Diagnosis not present

## 2019-04-11 DIAGNOSIS — I5032 Chronic diastolic (congestive) heart failure: Secondary | ICD-10-CM

## 2019-04-11 DIAGNOSIS — M109 Gout, unspecified: Secondary | ICD-10-CM | POA: Insufficient documentation

## 2019-04-11 DIAGNOSIS — D631 Anemia in chronic kidney disease: Secondary | ICD-10-CM

## 2019-04-11 LAB — CBC WITH DIFFERENTIAL/PLATELET
Abs Immature Granulocytes: 0.08 10*3/uL — ABNORMAL HIGH (ref 0.00–0.07)
Basophils Absolute: 0.1 10*3/uL (ref 0.0–0.1)
Basophils Relative: 1 %
Eosinophils Absolute: 0.1 10*3/uL (ref 0.0–0.5)
Eosinophils Relative: 1 %
HCT: 25.8 % — ABNORMAL LOW (ref 36.0–46.0)
Hemoglobin: 7.6 g/dL — ABNORMAL LOW (ref 12.0–15.0)
Immature Granulocytes: 1 %
Lymphocytes Relative: 20 %
Lymphs Abs: 1.6 10*3/uL (ref 0.7–4.0)
MCH: 27.5 pg (ref 26.0–34.0)
MCHC: 29.5 g/dL — ABNORMAL LOW (ref 30.0–36.0)
MCV: 93.5 fL (ref 80.0–100.0)
Monocytes Absolute: 0.6 10*3/uL (ref 0.1–1.0)
Monocytes Relative: 7 %
Neutro Abs: 5.7 10*3/uL (ref 1.7–7.7)
Neutrophils Relative %: 70 %
Platelets: 658 10*3/uL — ABNORMAL HIGH (ref 150–400)
RBC: 2.76 MIL/uL — ABNORMAL LOW (ref 3.87–5.11)
RDW: 14.6 % (ref 11.5–15.5)
WBC: 8.1 10*3/uL (ref 4.0–10.5)
nRBC: 0 % (ref 0.0–0.2)

## 2019-04-11 LAB — BASIC METABOLIC PANEL
Anion gap: 9 (ref 5–15)
BUN: 69 mg/dL — ABNORMAL HIGH (ref 8–23)
CO2: 21 mmol/L — ABNORMAL LOW (ref 22–32)
Calcium: 9.3 mg/dL (ref 8.9–10.3)
Chloride: 107 mmol/L (ref 98–111)
Creatinine, Ser: 1.56 mg/dL — ABNORMAL HIGH (ref 0.44–1.00)
GFR calc Af Amer: 35 mL/min — ABNORMAL LOW (ref >60)
GFR calc non Af Amer: 30 mL/min — ABNORMAL LOW (ref >60)
Glucose, Bld: 76 mg/dL (ref 70–99)
Potassium: 5.5 mmol/L — ABNORMAL HIGH (ref 3.5–5.1)
Sodium: 137 mmol/L (ref 135–145)

## 2019-04-11 LAB — HEMOGLOBIN A1C
Hgb A1c MFr Bld: 4.7 % — ABNORMAL LOW (ref 4.8–5.6)
Mean Plasma Glucose: 88.19 mg/dL

## 2019-04-11 LAB — URIC ACID: Uric Acid, Serum: 8.5 mg/dL — ABNORMAL HIGH (ref 2.5–7.1)

## 2019-04-11 NOTE — Progress Notes (Signed)
Location:   [facility] Room Number: 675 D Place of Service:  SNF (31)   CODE STATUS: Full Code  Allergies  Allergen Reactions  . Lactose Intolerance (Gi) Other (See Comments)    Upset stomach    Chief Complaint  Patient presents with  . Acute Visit    Lab Follow up    HPI:  Her labs have returned with a worsening renal function of her stage 3 ckd. She had been started on lasix for her diastolic chf. Her anemia is worse; she has recently been started on iron for her anemia with ha hgb 7.6. she states that her right wrist is feeling better is now just sore. She denies any coughing; no shortness of breath. There are no reports of fevers present.   Past Medical History:  Diagnosis Date  . A-fib ([place])   . Anemia   . Arthritis    right knee  . Cataract   . CHF (congestive heart failure) ([place])   . CKD (chronic kidney disease), stage III ([place])   . Dehydration   . Diabetes mellitus   . Gout   . Headache(784.0)   . Hypertension   . UTI (lower urinary tract infection)     Past Surgical History:  Procedure Laterality Date  . ABDOMINAL HYSTERECTOMY    . AMPUTATION FINGER Left 01/22/2019   Procedure: Amputation Left Index Finger First Joint;  Surgeon: Iran Planas, MD;  Location: [facility];  Service: Orthopedics;  Laterality: Left;  . ANTERIOR AND POSTERIOR REPAIR  05/17/2011   Procedure: ANTERIOR (CYSTOCELE) AND POSTERIOR REPAIR (RECTOCELE);  Surgeon: Eli Hose, MD;  Location: [facility];  Service: Gynecology;  Laterality: N/A;  Anterior repair with TVT bladder sling and cystoscopy  . BLADDER SUSPENSION  05/17/2011   Procedure: TRANSVAGINAL TAPE (TVT) PROCEDURE;  Surgeon: Eli Hose, MD;  Location: [facility];  Service: Gynecology;  Laterality: N/A;  . BREAST SURGERY     breast biopsy  . I&D EXTREMITY Left 01/22/2019   Procedure: IRRIGATION AND DEBRIDEMENT OF LEFT INDEX FINGER,;  Surgeon: Iran Planas, MD;  Location: [facility];  Service: Orthopedics;   Laterality: Left;  . IR FLUORO GUIDED NEEDLE PLC ASPIRATION/INJECTION LOC  04/05/2019    Social History   Socioeconomic History  . Marital status: Single    Spouse name: Not on file  . Number of children: Not on file  . Years of education: Not on file  . Highest education level: Not on file  Occupational History  . Not on file  Social Needs  . Financial resource strain: Not on file  . Food insecurity    Worry: Not on file    Inability: Not on file  . Transportation needs    Medical: Not on file    Non-medical: Not on file  Tobacco Use  . Smoking status: Never Smoker  . Smokeless tobacco: Never Used  Substance and Sexual Activity  . Alcohol use: No  . Drug use: No  . Sexual activity: Never    Birth control/protection: Abstinence  Lifestyle  . Physical activity    Days per week: Not on file    Minutes per session: Not on file  . Stress: Not on file  Relationships  . Social Herbalist on phone: Not on file    Gets together: Not on file    Attends religious service: Not on file    Active member of club or organization: Not on file  Attends meetings of clubs or organizations: Not on file    Relationship status: Not on file  . Intimate partner violence    Fear of current or ex partner: Not on file    Emotionally abused: Not on file    Physically abused: Not on file    Forced sexual activity: Not on file  Other Topics Concern  . Not on file  Social History Narrative  . Not on file   History reviewed. No pertinent family history.    VITAL SIGNS BP 120/72   Pulse 72   Temp 98.1 F (36.7 C)   Resp 17   Ht 5\' 6"  (1.676 m)   Wt 150 lb 9.6 oz (68.3 kg)   BMI 24.31 kg/m   Outpatient Encounter Medications as of 04/11/2019  Medication Sig  . acetaminophen (TYLENOL) 500 MG tablet Take 500 mg by mouth every 6 (six) hours as needed for mild pain.   Marland Kitchen allopurinol (ZYLOPRIM) 100 MG tablet Take 50 mg by mouth daily.  Marland Kitchen amLODipine (NORVASC) 2.5 MG tablet Take  1 tablet (2.5 mg total) by mouth daily.  Marland Kitchen amoxicillin-clavulanate (AUGMENTIN) 500-125 MG tablet Take 1 tablet (500 mg total) by mouth 2 (two) times a day for 6 days.  Marland Kitchen apixaban (ELIQUIS) 2.5 MG TABS tablet Take 2.5 mg by mouth 2 (two) times daily.  . colchicine 0.6 MG tablet Take 0.5 tablets (0.3 mg total) by mouth daily.  Marland Kitchen doxycycline (VIBRA-TABS) 100 MG tablet Take 1 tablet (100 mg total) by mouth 2 (two) times daily for 6 days.  . feeding supplement, GLUCERNA SHAKE, (GLUCERNA SHAKE) LIQD Take 237 mLs by mouth 3 (three) times daily between meals.  . ferrous fumarate (HEMOCYTE - 106 MG FE) 325 (106 FE) MG TABS tablet Take 1 tablet by mouth every morning.   Marland Kitchen HYDROcodone-acetaminophen (NORCO/VICODIN) 5-325 MG tablet Take 1-2 tablets by mouth every 6 (six) hours as needed for up to 7 days.  . Insulin NPH, Human,, Isophane, (HUMULIN N KWIKPEN) 100 UNIT/ML Kiwkpen Inject 30 Units into the skin daily. Give at 8:00 am  . NON FORMULARY Diet type:  NAS, low potassium diet  . olopatadine (PATANOL) 0.1 % ophthalmic solution Place 1 drop into both eyes 2 (two) times daily.  . predniSONE (DELTASONE) 20 MG tablet Take 20 mg by mouth daily with breakfast.  . sennosides-docusate sodium (SENOKOT-S) 8.6-50 MG tablet Take 2 tablets by mouth at bedtime.   No facility-administered encounter medications on file as of 04/11/2019.      SIGNIFICANT DIAGNOSTIC EXAMS  PREVIOUS:   03-25-19: chest x-ray: no active disease  03-25-19: ct of abdomen and pelvis:  1. Extensive colonic diverticulosis without evidence to suggest an acute diverticulitis at this time. 2. Mild left hydroureteronephrosis terminating in the proximal third of the left ureter. No obstructing stone identified. The possibility of a ureteral stricture could be considered. 3. Small amount of gas non dependently in the urinary bladder. This may be iatrogenic if there has been recent catheterization. In the absence of catheterization, correlation with  urinalysis is suggested to exclude the possibility of urinary tract infection with gas-forming organisms. 4. Aortic atherosclerosis. 5. Mild cardiomegaly. 6. Trace amount of biliary sludge and/or tiny gallstones lying dependently in the gallbladder. No findings to suggest an acute cholecystitis at this time.  03-25-19: ct of head: chronic atrophy and ischemic changes without acute abnormality.   04-02-19: right wrist x-ray: chronic changes without acute abnormality   NO NEW EXAMS.   LABS REVIEWED PREVIOUS:  03-25-19: wbc 7.9; hgb 9.2; hct 29.5; mcv 90.8 plt 174; glucose 82; bun 45; creat 1.57; k+ 3.7; na++ 144; ca 9.4; liver normal albumin 3.6 BNP 736.3 blood culture: e-coli/enterobacteriaceae species urine culture: e-coli klebsiella pneumoniae  03-28-19" wbc 7.4; hgb 8.4; ht 26.5; mcv 90.8; plt 171 glucose 79; bun 36; creat 1.37; k+ 4.6; na++ 141; ca 9.5  03-30-19: wbc 8.9; hgb 8.9; hct 28.2; mcv 87.9 ;plt 208 glucose 116; bun 24; creat 1.26; k+ 4.2; na++ 135; ca 9.3 phos 2.0; albumin 3.1 mag 1.5  04-01-19: wbc 13.3; hgb 8.7; hct 27.2; mcv 87.2; plt 269; glucose 117; bun 24; creat 1.42; k+ 4.3; na++ 135; ca 9.3 phos 2.2; albumin 2.6; mag 1.6 04-03-19: wbc 8.3; hgb 8.8; hct 28.3; mcv 89.0; plt 414; glucose 105; bun 39; creat 1.41; k+ 4.8; na++ 133; ca 9.4 liver normal albumin 2.7 04-04-19: mag 1.9   04-06-19: glucose 97; bun 47; creat 1.59; k+ 4.7; na++ 137; ca 9.2 04-07-19: glucose 96; bun 47; creat 1.58; k+ 4.7 na++ 137; ca 9.0   TODAY;   04-11-19: wbc 8.1; hgb 7.6; hct 25.8; mcv 93.5; plt 658; glucose 76; bun 69; creat 1.56; k+ 5.5; na++ 137; ca 9.3; uric acid 8.5; hgb a1c 4.7    Review of Systems  Constitutional: Negative for malaise/fatigue.  Respiratory: Negative for cough and shortness of breath.   Cardiovascular: Negative for chest pain, palpitations and leg swelling.  Gastrointestinal: Negative for abdominal pain, constipation and heartburn.  Musculoskeletal: Positive for joint pain.  Negative for back pain and myalgias.       Right wrist is "sore" not painful  Skin: Negative.   Neurological: Negative for dizziness.  Psychiatric/Behavioral: The patient is not nervous/anxious.      Physical Exam Constitutional:      General: She is not in acute distress.    Appearance: She is well-developed. She is not diaphoretic.  Neck:     Musculoskeletal: Neck supple.     Thyroid: No thyromegaly.  Cardiovascular:     Rate and Rhythm: Normal rate and regular rhythm.     Pulses: Normal pulses.     Heart sounds: Murmur present.     Comments: 2/6 Pulmonary:     Effort: Pulmonary effort is normal. No respiratory distress.     Breath sounds: Normal breath sounds.  Abdominal:     General: Bowel sounds are normal. There is no distension.     Palpations: Abdomen is soft.     Tenderness: There is no abdominal tenderness.  Musculoskeletal: Normal range of motion.     Right lower leg: No edema.     Left lower leg: No edema.     Comments: Is able to move all extremities Status post left index finger amputation  Right wrist is slightly swollen; is mildly tender to touch   Lymphadenopathy:     Cervical: No cervical adenopathy.  Skin:    General: Skin is warm and dry.     Comments: Bilateral lower extremities discolored   Neurological:     Mental Status: She is alert. Mental status is at baseline.  Psychiatric:        Mood and Affect: Mood normal.       ASSESSMENT/ PLAN:  TODAY:   1. Anemia due to ckd stage 3 2. Chronic diastolic heart failure 3. acute gout right wrist due to renal impairment 4. Hyperkalemia  Will continue iron and will repeat hgb/hct Will complete her prednisone and will continue her allopurinol and colchicine  Will stop her lasix at this time Will give her kayexalate 30 gm one time Will repeat bmp on 04-14-19  Will monitor her status.    MD is aware of resident's narcotic use and is in agreement with current plan of care. We will attempt to wean  resident as apropriate   Ok Edwards NP [facility]  Contact [phone] Monday through Friday 8am- 5pm  After hours call [phone]

## 2019-04-14 ENCOUNTER — Encounter (HOSPITAL_COMMUNITY)
Admission: RE | Admit: 2019-04-14 | Discharge: 2019-04-14 | Disposition: A | Discharge status: Home / Self Care | Payer: Medicare Other | Source: Skilled Nursing Facility | Attending: Adult Health | Admitting: Adult Health

## 2019-04-14 LAB — BASIC METABOLIC PANEL
Anion gap: 11 (ref 5–15)
BUN: 61 mg/dL — ABNORMAL HIGH (ref 8–23)
CO2: 21 mmol/L — ABNORMAL LOW (ref 22–32)
Calcium: 9.3 mg/dL (ref 8.9–10.3)
Chloride: 108 mmol/L (ref 98–111)
Creatinine, Ser: 1.66 mg/dL — ABNORMAL HIGH (ref 0.44–1.00)
GFR calc Af Amer: 32 mL/min — ABNORMAL LOW (ref >60)
GFR calc non Af Amer: 28 mL/min — ABNORMAL LOW (ref >60)
Glucose, Bld: 55 mg/dL — ABNORMAL LOW (ref 70–99)
Potassium: 4.6 mmol/L (ref 3.5–5.1)
Sodium: 140 mmol/L (ref 135–145)

## 2019-04-14 LAB — HEMOGLOBIN AND HEMATOCRIT, BLOOD
HCT: 26.2 % — ABNORMAL LOW (ref 36.0–46.0)
Hemoglobin: 7.7 g/dL — ABNORMAL LOW (ref 12.0–15.0)

## 2019-04-15 ENCOUNTER — Encounter: Payer: Self-pay | Admitting: Adult Health

## 2019-04-15 ENCOUNTER — Non-Acute Institutional Stay (SKILLED_NURSING_FACILITY): Payer: Medicare Other | Admitting: Adult Health

## 2019-04-15 DIAGNOSIS — N183 Chronic kidney disease, stage 3 (moderate): Secondary | ICD-10-CM | POA: Diagnosis not present

## 2019-04-15 DIAGNOSIS — M10331 Gout due to renal impairment, right wrist: Secondary | ICD-10-CM | POA: Diagnosis not present

## 2019-04-15 DIAGNOSIS — D631 Anemia in chronic kidney disease: Secondary | ICD-10-CM

## 2019-04-15 DIAGNOSIS — E875 Hyperkalemia: Secondary | ICD-10-CM | POA: Diagnosis not present

## 2019-04-15 DIAGNOSIS — I5032 Chronic diastolic (congestive) heart failure: Secondary | ICD-10-CM | POA: Diagnosis not present

## 2019-04-15 NOTE — Progress Notes (Signed)
Location:   [facility] Room Number: 353 D Place of Service:  SNF (31)   CODE STATUS: Full Code  Allergies  Allergen Reactions  . Lactose Intolerance (Gi) Other (See Comments)    Upset stomach    Chief Complaint  Patient presents with  . Acute Visit    Pain Management    HPI:  She has acute gout in her wrist. She has completed her prednisone dosing. She tells me that her pain is doing better; she is just sore. She does have some swelling present. There are no fevers present. She has an order for prn vicodin 5/325 mg as needed for pain which she has not used. Will stop this medication.   Past Medical History:  Diagnosis Date  . A-fib ([place])   . Anemia   . Arthritis    right knee  . Cataract   . CHF (congestive heart failure) ([place])   . CKD (chronic kidney disease), stage III ([place])   . Dehydration   . Diabetes mellitus   . Gout   . Headache(784.0)   . Hypertension   . UTI (lower urinary tract infection)     Past Surgical History:  Procedure Laterality Date  . ABDOMINAL HYSTERECTOMY    . AMPUTATION FINGER Left 01/22/2019   Procedure: Amputation Left Index Finger First Joint;  Surgeon: Iran Planas, MD;  Location: [facility];  Service: Orthopedics;  Laterality: Left;  . ANTERIOR AND POSTERIOR REPAIR  05/17/2011   Procedure: ANTERIOR (CYSTOCELE) AND POSTERIOR REPAIR (RECTOCELE);  Surgeon: Eli Hose, MD;  Location: [facility];  Service: Gynecology;  Laterality: N/A;  Anterior repair with TVT bladder sling and cystoscopy  . BLADDER SUSPENSION  05/17/2011   Procedure: TRANSVAGINAL TAPE (TVT) PROCEDURE;  Surgeon: Eli Hose, MD;  Location: [facility];  Service: Gynecology;  Laterality: N/A;  . BREAST SURGERY     breast biopsy  . I&D EXTREMITY Left 01/22/2019   Procedure: IRRIGATION AND DEBRIDEMENT OF LEFT INDEX FINGER,;  Surgeon: Iran Planas, MD;  Location: [facility];  Service: Orthopedics;  Laterality: Left;  . IR FLUORO GUIDED NEEDLE PLC  ASPIRATION/INJECTION LOC  04/05/2019    Social History   Socioeconomic History  . Marital status: Single    Spouse name: Not on file  . Number of children: Not on file  . Years of education: Not on file  . Highest education level: Not on file  Occupational History  . Not on file  Social Needs  . Financial resource strain: Not on file  . Food insecurity    Worry: Not on file    Inability: Not on file  . Transportation needs    Medical: Not on file    Non-medical: Not on file  Tobacco Use  . Smoking status: Never Smoker  . Smokeless tobacco: Never Used  Substance and Sexual Activity  . Alcohol use: No  . Drug use: No  . Sexual activity: Never    Birth control/protection: Abstinence  Lifestyle  . Physical activity    Days per week: Not on file    Minutes per session: Not on file  . Stress: Not on file  Relationships  . Social Herbalist on phone: Not on file    Gets together: Not on file    Attends religious service: Not on file    Active member of club or organization: Not on file    Attends meetings of clubs or organizations: Not on file  Relationship status: Not on file  . Intimate partner violence    Fear of current or ex partner: Not on file    Emotionally abused: Not on file    Physically abused: Not on file    Forced sexual activity: Not on file  Other Topics Concern  . Not on file  Social History Narrative  . Not on file   History reviewed. No pertinent family history.    VITAL SIGNS BP 122/61   Pulse 80   Temp (!) 96.9 F (36.1 C)   Resp 20   Ht 5\' 6"  (1.676 m)   Wt 156 lb 4.8 oz (70.9 kg)   BMI 25.23 kg/m   Outpatient Encounter Medications as of 04/15/2019  Medication Sig  . acetaminophen (TYLENOL) 500 MG tablet Take 500 mg by mouth every 6 (six) hours as needed for mild pain.   Marland Kitchen allopurinol (ZYLOPRIM) 100 MG tablet Take 50 mg by mouth daily.  Marland Kitchen amLODipine (NORVASC) 2.5 MG tablet Take 1 tablet (2.5 mg total) by mouth daily.  Marland Kitchen  apixaban (ELIQUIS) 2.5 MG TABS tablet Take 2.5 mg by mouth 2 (two) times daily.  . colchicine 0.6 MG tablet Take 0.5 tablets (0.3 mg total) by mouth daily.  . feeding supplement, GLUCERNA SHAKE, (GLUCERNA SHAKE) LIQD Take 237 mLs by mouth 3 (three) times daily between meals.  . ferrous fumarate (HEMOCYTE - 106 MG FE) 325 (106 FE) MG TABS tablet Take 1 tablet by mouth every morning.   Marland Kitchen HYDROcodone-acetaminophen (NORCO/VICODIN) 5-325 MG tablet Take 1-2 tablets by mouth every 6 (six) hours as needed for up to 7 days.  . Insulin NPH, Human,, Isophane, (HUMULIN N KWIKPEN) 100 UNIT/ML Kiwkpen Inject 30 Units into the skin daily. Give at 8:00 am  . NON FORMULARY Diet type:  NAS, low potassium diet  . olopatadine (PATANOL) 0.1 % ophthalmic solution Place 1 drop into both eyes 2 (two) times daily.  . sennosides-docusate sodium (SENOKOT-S) 8.6-50 MG tablet Take 2 tablets by mouth at bedtime.   No facility-administered encounter medications on file as of 04/15/2019.      SIGNIFICANT DIAGNOSTIC EXAMS  PREVIOUS:   03-25-19: chest x-ray: no active disease  03-25-19: ct of abdomen and pelvis:  1. Extensive colonic diverticulosis without evidence to suggest an acute diverticulitis at this time. 2. Mild left hydroureteronephrosis terminating in the proximal third of the left ureter. No obstructing stone identified. The possibility of a ureteral stricture could be considered. 3. Small amount of gas non dependently in the urinary bladder. This may be iatrogenic if there has been recent catheterization. In the absence of catheterization, correlation with urinalysis is suggested to exclude the possibility of urinary tract infection with gas-forming organisms. 4. Aortic atherosclerosis. 5. Mild cardiomegaly. 6. Trace amount of biliary sludge and/or tiny gallstones lying dependently in the gallbladder. No findings to suggest an acute cholecystitis at this time.  03-25-19: ct of head: chronic atrophy and ischemic  changes without acute abnormality.   04-02-19: right wrist x-ray: chronic changes without acute abnormality   NO NEW EXAMS.   LABS REVIEWED PREVIOUS:   03-25-19: wbc 7.9; hgb 9.2; hct 29.5; mcv 90.8 plt 174; glucose 82; bun 45; creat 1.57; k+ 3.7; na++ 144; ca 9.4; liver normal albumin 3.6 BNP 736.3 blood culture: e-coli/enterobacteriaceae species urine culture: e-coli klebsiella pneumoniae  03-28-19" wbc 7.4; hgb 8.4; ht 26.5; mcv 90.8; plt 171 glucose 79; bun 36; creat 1.37; k+ 4.6; na++ 141; ca 9.5  03-30-19: wbc 8.9; hgb 8.9; hct  28.2; mcv 87.9 ;plt 208 glucose 116; bun 24; creat 1.26; k+ 4.2; na++ 135; ca 9.3 phos 2.0; albumin 3.1 mag 1.5  04-01-19: wbc 13.3; hgb 8.7; hct 27.2; mcv 87.2; plt 269; glucose 117; bun 24; creat 1.42; k+ 4.3; na++ 135; ca 9.3 phos 2.2; albumin 2.6; mag 1.6 04-03-19: wbc 8.3; hgb 8.8; hct 28.3; mcv 89.0; plt 414; glucose 105; bun 39; creat 1.41; k+ 4.8; na++ 133; ca 9.4 liver normal albumin 2.7 04-04-19: mag 1.9   04-06-19: glucose 97; bun 47; creat 1.59; k+ 4.7; na++ 137; ca 9.2 04-07-19: glucose 96; bun 47; creat 1.58; k+ 4.7 na++ 137; ca 9.0  04-11-19: wbc 8.1; hgb 7.6; hct 25.8; mcv 93.5; plt 658; glucose 76; bun 69; creat 1.56; k+ 5.5; na++ 137; ca 9.3; uric acid 8.5; hgb a1c 4.7   TODAY;   04-14-19: hgb 7.7; hct 26.2  glucose 55; bun 61; creat 1.66; k+ 4.6; na++ 140; ca 9.3;    Review of Systems  Constitutional: Negative for malaise/fatigue.  Respiratory: Negative for cough and shortness of breath.   Cardiovascular: Negative for chest pain, palpitations and leg swelling.  Gastrointestinal: Negative for abdominal pain, constipation and heartburn.  Musculoskeletal: Positive for joint pain. Negative for back pain and myalgias.       Right wrist does not hurt but is sore   Skin: Negative.   Neurological: Negative for dizziness.  Psychiatric/Behavioral: The patient is not nervous/anxious.      Physical Exam Constitutional:      General: She is not in acute  distress.    Appearance: She is well-developed. She is not diaphoretic.  Neck:     Musculoskeletal: Neck supple.     Thyroid: No thyromegaly.  Cardiovascular:     Rate and Rhythm: Normal rate and regular rhythm.     Pulses: Normal pulses.     Heart sounds: Murmur present.     Comments: 2/6 Pulmonary:     Effort: Pulmonary effort is normal. No respiratory distress.     Breath sounds: Normal breath sounds.  Abdominal:     General: Bowel sounds are normal. There is no distension.     Palpations: Abdomen is soft.     Tenderness: There is no abdominal tenderness.  Musculoskeletal:     Right lower leg: No edema.     Left lower leg: No edema.     Comments: Is able to move all extremities Status post left index finger amputation  Right wrist is slightly swollen; is mildly tender to touch   Has weak right grip   Lymphadenopathy:     Cervical: No cervical adenopathy.  Skin:    General: Skin is warm and dry.     Comments: Bilateral lower extremities discolored   Neurological:     Mental Status: She is alert. Mental status is at baseline.  Psychiatric:        Mood and Affect: Mood normal.      ASSESSMENT/ PLAN:  TODAY:   1. Anemia due to ckd stage 3 2. Chronic diastolic heart failure 3. Acute gout right wrist due to renal impairment 4. Hyperkalemia.   Her hgb is stable Her k+ level is normal  Will stop vicodin 5/325 mg tabs Will continue therapy as directed.    MD is aware of resident's narcotic use and is in agreement with current plan of care. We will attempt to wean resident as apropriate   Ok Edwards NP [facility]  Contact [phone] Monday through Friday 8am-  5pm  After hours call [phone]

## 2019-04-16 ENCOUNTER — Other Ambulatory Visit (HOSPITAL_COMMUNITY)
Admission: RE | Admit: 2019-04-16 | Discharge: 2019-04-16 | Disposition: A | Discharge status: Home / Self Care | Payer: Medicare Other | Source: Ambulatory Visit | Attending: Adult Health | Admitting: Adult Health

## 2019-04-16 ENCOUNTER — Encounter: Payer: Self-pay | Admitting: Adult Health

## 2019-04-16 ENCOUNTER — Non-Acute Institutional Stay (SKILLED_NURSING_FACILITY): Payer: Medicare Other | Admitting: Adult Health

## 2019-04-16 DIAGNOSIS — E875 Hyperkalemia: Secondary | ICD-10-CM

## 2019-04-16 DIAGNOSIS — E1122 Type 2 diabetes mellitus with diabetic chronic kidney disease: Secondary | ICD-10-CM | POA: Diagnosis not present

## 2019-04-16 DIAGNOSIS — D631 Anemia in chronic kidney disease: Secondary | ICD-10-CM

## 2019-04-16 DIAGNOSIS — I130 Hypertensive heart and chronic kidney disease with heart failure and stage 1 through stage 4 chronic kidney disease, or unspecified chronic kidney disease: Secondary | ICD-10-CM | POA: Insufficient documentation

## 2019-04-16 DIAGNOSIS — N183 Chronic kidney disease, stage 3 (moderate): Secondary | ICD-10-CM

## 2019-04-16 DIAGNOSIS — M10331 Gout due to renal impairment, right wrist: Secondary | ICD-10-CM

## 2019-04-16 LAB — BASIC METABOLIC PANEL
Anion gap: 9 (ref 5–15)
BUN: 69 mg/dL — ABNORMAL HIGH (ref 8–23)
CO2: 24 mmol/L (ref 22–32)
Calcium: 8.9 mg/dL (ref 8.9–10.3)
Chloride: 110 mmol/L (ref 98–111)
Creatinine, Ser: 1.47 mg/dL — ABNORMAL HIGH (ref 0.44–1.00)
GFR calc Af Amer: 37 mL/min — ABNORMAL LOW (ref >60)
GFR calc non Af Amer: 32 mL/min — ABNORMAL LOW (ref >60)
Glucose, Bld: 87 mg/dL (ref 70–99)
Potassium: 5.7 mmol/L — ABNORMAL HIGH (ref 3.5–5.1)
Sodium: 143 mmol/L (ref 135–145)

## 2019-04-16 LAB — HEMOGLOBIN AND HEMATOCRIT, BLOOD
HCT: 29.5 % — ABNORMAL LOW (ref 36.0–46.0)
Hemoglobin: 8.6 g/dL — ABNORMAL LOW (ref 12.0–15.0)

## 2019-04-16 NOTE — Progress Notes (Signed)
Location:   [place] Room Number: 277 D Place of Service:  SNF (31)   CODE STATUS: Full Code  Allergies  Allergen Reactions   Lactose Intolerance (Gi) Other (See Comments)    Upset stomach    Chief Complaint  Patient presents with   Acute Visit    Care Plan Meeting    HPI:  We have come together for her routine care plan meeting her family is present via phone. Her BIMS is 0; her depression is 0. Her renal is worse; she has required kayexalate for her hyperkalemia. Her goal remains to return back home. Her right wrist is doing better; she tells me that she is sore; but no actual pain. She does have a weak right hand grip. There are no reports of anxiety or agitation present. There are no reports of fevers present.   Past Medical History:  Diagnosis Date   A-fib ([place])    Anemia    Arthritis    right knee   Cataract    CHF (congestive heart failure) (HCC)    CKD (chronic kidney disease), stage III (HCC)    Dehydration    Diabetes mellitus    Gout    Headache(784.0)    Hypertension    UTI (lower urinary tract infection)     Past Surgical History:  Procedure Laterality Date   ABDOMINAL HYSTERECTOMY     AMPUTATION FINGER Left 01/22/2019   Procedure: Amputation Left Index Finger First Joint;  Surgeon: Iran Planas, MD;  Location: [facility];  Service: Orthopedics;  Laterality: Left;   ANTERIOR AND POSTERIOR REPAIR  05/17/2011   Procedure: ANTERIOR (CYSTOCELE) AND POSTERIOR REPAIR (RECTOCELE);  Surgeon: Eli Hose, MD;  Location: [facility];  Service: Gynecology;  Laterality: N/A;  Anterior repair with TVT bladder sling and cystoscopy   BLADDER SUSPENSION  05/17/2011   Procedure: TRANSVAGINAL TAPE (TVT) PROCEDURE;  Surgeon: Eli Hose, MD;  Location: [facility];  Service: Gynecology;  Laterality: N/A;   BREAST SURGERY     breast biopsy   I&D EXTREMITY Left 01/22/2019   Procedure: IRRIGATION AND DEBRIDEMENT OF LEFT INDEX  FINGER,;  Surgeon: Iran Planas, MD;  Location: [facility];  Service: Orthopedics;  Laterality: Left;   IR FLUORO GUIDED NEEDLE PLC ASPIRATION/INJECTION LOC  04/05/2019    Social History   Socioeconomic History   Marital status: Single    Spouse name: Not on file   Number of children: Not on file   Years of education: Not on file   Highest education level: Not on file  Occupational History   Not on file  Social Needs   Financial resource strain: Not on file   Food insecurity    Worry: Not on file    Inability: Not on file   Transportation needs    Medical: Not on file    Non-medical: Not on file  Tobacco Use   Smoking status: Never Smoker   Smokeless tobacco: Never Used  Substance and Sexual Activity   Alcohol use: No   Drug use: No   Sexual activity: Never    Birth control/protection: Abstinence  Lifestyle   Physical activity    Days per week: Not on file    Minutes per session: Not on file   Stress: Not on file  Relationships   Social connections    Talks on phone: Not on file    Gets together: Not on file    Attends religious service: Not on file  Active member of club or organization: Not on file    Attends meetings of clubs or organizations: Not on file    Relationship status: Not on file   Intimate partner violence    Fear of current or ex partner: Not on file    Emotionally abused: Not on file    Physically abused: Not on file    Forced sexual activity: Not on file  Other Topics Concern   Not on file  Social History Narrative   Not on file   History reviewed. No pertinent family history.    VITAL SIGNS BP 130/74    Pulse 88    Temp 97.6 F (36.4 C)    Resp 20    Ht 5\' 6"  (1.676 m)    Wt 156 lb 4.8 oz (70.9 kg)    BMI 25.23 kg/m   Outpatient Encounter Medications as of 04/16/2019  Medication Sig   acetaminophen (TYLENOL) 500 MG tablet Take 500 mg by mouth every 6 (six) hours as needed for mild pain.    allopurinol (ZYLOPRIM) 100  MG tablet Take 50 mg by mouth daily.   amLODipine (NORVASC) 2.5 MG tablet Take 1 tablet (2.5 mg total) by mouth daily.   apixaban (ELIQUIS) 2.5 MG TABS tablet Take 2.5 mg by mouth 2 (two) times daily.   colchicine 0.6 MG tablet Take 0.5 tablets (0.3 mg total) by mouth daily.   feeding supplement, GLUCERNA SHAKE, (GLUCERNA SHAKE) LIQD Take 237 mLs by mouth 3 (three) times daily between meals.   ferrous fumarate (HEMOCYTE - 106 MG FE) 325 (106 FE) MG TABS tablet Take 1 tablet by mouth every morning.    Insulin NPH, Human,, Isophane, (HUMULIN N KWIKPEN) 100 UNIT/ML Kiwkpen Inject 30 Units into the skin daily. Give at 8:00 am   NON FORMULARY Diet type:  NAS, low potassium diet   olopatadine (PATANOL) 0.1 % ophthalmic solution Place 1 drop into both eyes 2 (two) times daily.   sennosides-docusate sodium (SENOKOT-S) 8.6-50 MG tablet Take 2 tablets by mouth at bedtime.   No facility-administered encounter medications on file as of 04/16/2019.      SIGNIFICANT DIAGNOSTIC EXAMS   PREVIOUS:   03-25-19: chest x-ray: no active disease  03-25-19: ct of abdomen and pelvis:  1. Extensive colonic diverticulosis without evidence to suggest an acute diverticulitis at this time. 2. Mild left hydroureteronephrosis terminating in the proximal third of the left ureter. No obstructing stone identified. The possibility of a ureteral stricture could be considered. 3. Small amount of gas non dependently in the urinary bladder. This may be iatrogenic if there has been recent catheterization. In the absence of catheterization, correlation with urinalysis is suggested to exclude the possibility of urinary tract infection with gas-forming organisms. 4. Aortic atherosclerosis. 5. Mild cardiomegaly. 6. Trace amount of biliary sludge and/or tiny gallstones lying dependently in the gallbladder. No findings to suggest an acute cholecystitis at this time.  03-25-19: ct of head: chronic atrophy and ischemic changes  without acute abnormality.   04-02-19: right wrist x-ray: chronic changes without acute abnormality   NO NEW EXAMS.   LABS REVIEWED PREVIOUS:   03-25-19: wbc 7.9; hgb 9.2; hct 29.5; mcv 90.8 plt 174; glucose 82; bun 45; creat 1.57; k+ 3.7; na++ 144; ca 9.4; liver normal albumin 3.6 BNP 736.3 blood culture: e-coli/enterobacteriaceae species urine culture: e-coli klebsiella pneumoniae  03-28-19" wbc 7.4; hgb 8.4; ht 26.5; mcv 90.8; plt 171 glucose 79; bun 36; creat 1.37; k+ 4.6; na++ 141; ca 9.5  03-30-19: wbc 8.9; hgb 8.9; hct 28.2; mcv 87.9 ;plt 208 glucose 116; bun 24; creat 1.26; k+ 4.2; na++ 135; ca 9.3 phos 2.0; albumin 3.1 mag 1.5  04-01-19: wbc 13.3; hgb 8.7; hct 27.2; mcv 87.2; plt 269; glucose 117; bun 24; creat 1.42; k+ 4.3; na++ 135; ca 9.3 phos 2.2; albumin 2.6; mag 1.6 04-03-19: wbc 8.3; hgb 8.8; hct 28.3; mcv 89.0; plt 414; glucose 105; bun 39; creat 1.41; k+ 4.8; na++ 133; ca 9.4 liver normal albumin 2.7 04-04-19: mag 1.9   04-06-19: glucose 97; bun 47; creat 1.59; k+ 4.7; na++ 137; ca 9.2 04-07-19: glucose 96; bun 47; creat 1.58; k+ 4.7 na++ 137; ca 9.0  04-11-19: wbc 8.1; hgb 7.6; hct 25.8; mcv 93.5; plt 658; glucose 76; bun 69; creat 1.56; k+ 5.5; na++ 137; ca 9.3; uric acid 8.5; hgb a1c 4.7  04-14-19: hgb 7.7; hct 26.2  glucose 55; bun 61; creat 1.66; k+ 4.6; na++ 140; ca 9.3;   TODAY;  04-16-19: hgb 8.6; hct 29.5; glucose 87; bun 69; creat 1.47; k+ 5.7; na++ 143; ca 8.9     Review of Systems  Constitutional: Negative for malaise/fatigue.  Respiratory: Negative for cough and shortness of breath.   Cardiovascular: Negative for chest pain, palpitations and leg swelling.  Gastrointestinal: Negative for abdominal pain, constipation and heartburn.  Musculoskeletal: Positive for joint pain. Negative for back pain and myalgias.       Right joint pain is much better; just sore  Skin: Negative.   Neurological: Negative for dizziness.  Psychiatric/Behavioral: The patient is not  nervous/anxious.      Physical Exam Constitutional:      General: She is not in acute distress.    Appearance: She is well-developed. She is not diaphoretic.  Neck:     Musculoskeletal: Neck supple.     Thyroid: No thyromegaly.  Cardiovascular:     Rate and Rhythm: Normal rate and regular rhythm.     Pulses: Normal pulses.     Heart sounds: Murmur present.     Comments: 2/6 Pulmonary:     Effort: Pulmonary effort is normal. No respiratory distress.     Breath sounds: Normal breath sounds.  Abdominal:     General: Bowel sounds are normal. There is no distension.     Palpations: Abdomen is soft.     Tenderness: There is no abdominal tenderness.  Musculoskeletal:     Right lower leg: No edema.     Left lower leg: No edema.     Comments: Is able to move all extremities Status post left index finger amputation  Right wrist very little sweling; is mildly tender to touch   Has weak right grip    Lymphadenopathy:     Cervical: No cervical adenopathy.  Skin:    General: Skin is warm and dry.  Neurological:     Mental Status: She is alert. Mental status is at baseline.  Psychiatric:        Mood and Affect: Mood normal.      ASSESSMENT/ PLAN:  TODAY:   1. Stage 3 chronic kidney disease due to type 2 diabetes mellitus 2. Acute gout due to renal impairment of right wrist 3. Anemia due to stage 3 chronic kidney disease 4. Hyperkalemia  Will give her 30 gm kayexalate now Will repeat bmp on 04-17-19 Her goal is to return back home possibly next week Will monitor her status.    MD is aware of resident's narcotic use and is in agreement  with current plan of care. We will attempt to wean resident as apropriate   Ok Edwards NP [facility]  Contact [phone] Monday through Friday 8am- 5pm  After hours call [phone]

## 2019-04-17 ENCOUNTER — Encounter (HOSPITAL_COMMUNITY)
Admission: RE | Admit: 2019-04-17 | Discharge: 2019-04-17 | Disposition: A | Discharge status: Home / Self Care | Payer: Medicare Other | Source: Skilled Nursing Facility | Attending: *Deleted | Admitting: *Deleted

## 2019-04-17 ENCOUNTER — Non-Acute Institutional Stay (SKILLED_NURSING_FACILITY): Payer: Medicare Other | Admitting: Adult Health

## 2019-04-17 ENCOUNTER — Encounter: Payer: Self-pay | Admitting: Adult Health

## 2019-04-17 DIAGNOSIS — E1122 Type 2 diabetes mellitus with diabetic chronic kidney disease: Secondary | ICD-10-CM | POA: Diagnosis not present

## 2019-04-17 DIAGNOSIS — M10331 Gout due to renal impairment, right wrist: Secondary | ICD-10-CM | POA: Diagnosis not present

## 2019-04-17 DIAGNOSIS — N183 Chronic kidney disease, stage 3 unspecified: Secondary | ICD-10-CM

## 2019-04-17 DIAGNOSIS — Z794 Long term (current) use of insulin: Secondary | ICD-10-CM

## 2019-04-17 LAB — BASIC METABOLIC PANEL
Anion gap: 12 (ref 5–15)
BUN: 62 mg/dL — ABNORMAL HIGH (ref 8–23)
CO2: 23 mmol/L (ref 22–32)
Calcium: 8.4 mg/dL — ABNORMAL LOW (ref 8.9–10.3)
Chloride: 108 mmol/L (ref 98–111)
Creatinine, Ser: 1.7 mg/dL — ABNORMAL HIGH (ref 0.44–1.00)
GFR calc Af Amer: 31 mL/min — ABNORMAL LOW (ref >60)
GFR calc non Af Amer: 27 mL/min — ABNORMAL LOW (ref >60)
Glucose, Bld: 46 mg/dL — ABNORMAL LOW (ref 70–99)
Potassium: 4.6 mmol/L (ref 3.5–5.1)
Sodium: 143 mmol/L (ref 135–145)

## 2019-04-17 NOTE — Progress Notes (Signed)
Location:   [place] Room Number: 812 D Place of Service:  SNF (31)   CODE STATUS: Full Code  Allergies  Allergen Reactions  . Lactose Intolerance (Gi) Other (See Comments)    Upset stomach    Chief Complaint  Patient presents with  . Medical Management of Chronic Issues      Controlled type 2 diabetes mellitus with stage 3 chronic kidney disease with long term current use of insulin: Stage 3 chronic kidney disease due to type 2 diabetes mellitus Acute gout due to renal impairment involving right wrist:   Weekly follow up for the first 30 days post hospitalization.     HPI:  She is a 85 year old short tem rehab patient being seen for the management of her chronic illnesses; diabetes;ckd stage 3; gout. Her right wrist is worse with increased swelling and pain present. There are no reports of agitation present; no reports of fevers present.   Past Medical History:  Diagnosis Date  . A-fib ([place])   . Anemia   . Arthritis    right knee  . Cataract   . CHF (congestive heart failure) ([place])   . CKD (chronic kidney disease), stage III ([place])   . Dehydration   . Diabetes mellitus   . Gout   . Headache(784.0)   . Hypertension   . UTI (lower urinary tract infection)     Past Surgical History:  Procedure Laterality Date  . ABDOMINAL HYSTERECTOMY    . AMPUTATION FINGER Left 01/22/2019   Procedure: Amputation Left Index Finger First Joint;  Surgeon: Iran Planas, MD;  Location: [facility];  Service: Orthopedics;  Laterality: Left;  . ANTERIOR AND POSTERIOR REPAIR  05/17/2011   Procedure: ANTERIOR (CYSTOCELE) AND POSTERIOR REPAIR (RECTOCELE);  Surgeon: Eli Hose, MD;  Location: [facility];  Service: Gynecology;  Laterality: N/A;  Anterior repair with TVT bladder sling and cystoscopy  . BLADDER SUSPENSION  05/17/2011   Procedure: TRANSVAGINAL TAPE (TVT) PROCEDURE;  Surgeon: Eli Hose, MD;  Location: [facility];  Service: Gynecology;  Laterality: N/A;  .  BREAST SURGERY     breast biopsy  . I&D EXTREMITY Left 01/22/2019   Procedure: IRRIGATION AND DEBRIDEMENT OF LEFT INDEX FINGER,;  Surgeon: Iran Planas, MD;  Location: [facility];  Service: Orthopedics;  Laterality: Left;  . IR FLUORO GUIDED NEEDLE PLC ASPIRATION/INJECTION LOC  04/05/2019    Social History   Socioeconomic History  . Marital status: Single    Spouse name: Not on file  . Number of children: Not on file  . Years of education: Not on file  . Highest education level: Not on file  Occupational History  . Not on file  Social Needs  . Financial resource strain: Not on file  . Food insecurity    Worry: Not on file    Inability: Not on file  . Transportation needs    Medical: Not on file    Non-medical: Not on file  Tobacco Use  . Smoking status: Never Smoker  . Smokeless tobacco: Never Used  Substance and Sexual Activity  . Alcohol use: No  . Drug use: No  . Sexual activity: Never    Birth control/protection: Abstinence  Lifestyle  . Physical activity    Days per week: Not on file    Minutes per session: Not on file  . Stress: Not on file  Relationships  . Social connections    Talks on phone: Not on file  Gets together: Not on file    Attends religious service: Not on file    Active member of club or organization: Not on file    Attends meetings of clubs or organizations: Not on file    Relationship status: Not on file  . Intimate partner violence    Fear of current or ex partner: Not on file    Emotionally abused: Not on file    Physically abused: Not on file    Forced sexual activity: Not on file  Other Topics Concern  . Not on file  Social History Narrative  . Not on file   No family history on file.    VITAL SIGNS BP (!) 141/59   Pulse 87   Temp 98.2 F (36.8 C)   Resp 20   Ht 5\' 6"  (1.676 m)   Wt 156 lb 4.8 oz (70.9 kg)   BMI 25.23 kg/m   Outpatient Encounter Medications as of 04/17/2019  Medication Sig  . acetaminophen (TYLENOL) 500 MG  tablet Take 500 mg by mouth every 6 (six) hours as needed for mild pain.   Marland Kitchen allopurinol (ZYLOPRIM) 100 MG tablet Take 50 mg by mouth daily.  Marland Kitchen amLODipine (NORVASC) 2.5 MG tablet Take 1 tablet (2.5 mg total) by mouth daily.  Marland Kitchen apixaban (ELIQUIS) 2.5 MG TABS tablet Take 2.5 mg by mouth 2 (two) times daily.  . colchicine 0.6 MG tablet Take 0.5 tablets (0.3 mg total) by mouth daily.  . feeding supplement, GLUCERNA SHAKE, (GLUCERNA SHAKE) LIQD Take 237 mLs by mouth 3 (three) times daily between meals.  . ferrous fumarate (HEMOCYTE - 106 MG FE) 325 (106 FE) MG TABS tablet Take 1 tablet by mouth every morning.   . NON FORMULARY Diet type:  NAS, low potassium diet  . olopatadine (PATANOL) 0.1 % ophthalmic solution Place 1 drop into both eyes 2 (two) times daily.  . sennosides-docusate sodium (SENOKOT-S) 8.6-50 MG tablet Take 2 tablets by mouth at bedtime.  . [DISCONTINUED] Insulin NPH, Human,, Isophane, (HUMULIN N KWIKPEN) 100 UNIT/ML Kiwkpen Inject 30 Units into the skin daily. Give at 8:00 am   No facility-administered encounter medications on file as of 04/17/2019.      SIGNIFICANT DIAGNOSTIC EXAMS   PREVIOUS:   03-25-19: chest x-ray: no active disease  03-25-19: ct of abdomen and pelvis:  1. Extensive colonic diverticulosis without evidence to suggest an acute diverticulitis at this time. 2. Mild left hydroureteronephrosis terminating in the proximal third of the left ureter. No obstructing stone identified. The possibility of a ureteral stricture could be considered. 3. Small amount of gas non dependently in the urinary bladder. This may be iatrogenic if there has been recent catheterization. In the absence of catheterization, correlation with urinalysis is suggested to exclude the possibility of urinary tract infection with gas-forming organisms. 4. Aortic atherosclerosis. 5. Mild cardiomegaly. 6. Trace amount of biliary sludge and/or tiny gallstones lying dependently in the gallbladder. No  findings to suggest an acute cholecystitis at this time.  03-25-19: ct of head: chronic atrophy and ischemic changes without acute abnormality.   04-02-19: right wrist x-ray: chronic changes without acute abnormality   NO NEW EXAMS.   LABS REVIEWED PREVIOUS:   03-25-19: wbc 7.9; hgb 9.2; hct 29.5; mcv 90.8 plt 174; glucose 82; bun 45; creat 1.57; k+ 3.7; na++ 144; ca 9.4; liver normal albumin 3.6 BNP 736.3 blood culture: e-coli/enterobacteriaceae species urine culture: e-coli klebsiella pneumoniae  03-28-19" wbc 7.4; hgb 8.4; ht 26.5; mcv 90.8; plt 171  glucose 79; bun 36; creat 1.37; k+ 4.6; na++ 141; ca 9.5  03-30-19: wbc 8.9; hgb 8.9; hct 28.2; mcv 87.9 ;plt 208 glucose 116; bun 24; creat 1.26; k+ 4.2; na++ 135; ca 9.3 phos 2.0; albumin 3.1 mag 1.5  04-01-19: wbc 13.3; hgb 8.7; hct 27.2; mcv 87.2; plt 269; glucose 117; bun 24; creat 1.42; k+ 4.3; na++ 135; ca 9.3 phos 2.2; albumin 2.6; mag 1.6 04-03-19: wbc 8.3; hgb 8.8; hct 28.3; mcv 89.0; plt 414; glucose 105; bun 39; creat 1.41; k+ 4.8; na++ 133; ca 9.4 liver normal albumin 2.7 04-04-19: mag 1.9   04-06-19: glucose 97; bun 47; creat 1.59; k+ 4.7; na++ 137; ca 9.2 04-07-19: glucose 96; bun 47; creat 1.58; k+ 4.7 na++ 137; ca 9.0  04-11-19: wbc 8.1; hgb 7.6; hct 25.8; mcv 93.5; plt 658; glucose 76; bun 69; creat 1.56; k+ 5.5; na++ 137; ca 9.3; uric acid 8.5; hgb a1c 4.7  04-14-19: hgb 7.7; hct 26.2  glucose 55; bun 61; creat 1.66; k+ 4.6; na++ 140; ca 9.3;  04-16-19: hgb 8.6; hct 29.5; glucose 87; bun 69; creat 1.47; k+ 5.7; na++ 143; ca 8.9   TODAY'  04-17-19: glucose 46 bun 62; creat 1.70; k+ 4.6; na++ 143; ca 8.4     Review of Systems  Constitutional: Negative for malaise/fatigue.  Respiratory: Negative for cough and shortness of breath.   Cardiovascular: Negative for chest pain, palpitations and leg swelling.  Gastrointestinal: Negative for abdominal pain, constipation and heartburn.  Musculoskeletal: Positive for joint pain. Negative for back  pain and myalgias.       Right wrist very painful very little movement right hand   Skin: Negative.   Neurological: Negative for dizziness.  Psychiatric/Behavioral: The patient is not nervous/anxious.      Physical Exam Constitutional:      General: She is not in acute distress.    Appearance: She is well-developed. She is not diaphoretic.  Neck:     Musculoskeletal: Neck supple.     Thyroid: No thyromegaly.  Cardiovascular:     Rate and Rhythm: Normal rate and regular rhythm.     Pulses: Normal pulses.     Heart sounds: Murmur present.     Comments: 2/6 Pulmonary:     Effort: Pulmonary effort is normal. No respiratory distress.     Breath sounds: Normal breath sounds.  Abdominal:     General: Bowel sounds are normal. There is no distension.     Palpations: Abdomen is soft.     Tenderness: There is no abdominal tenderness.  Musculoskeletal:     Right lower leg: No edema.     Left lower leg: No edema.     Comments:  Is able to move all extremities Status post left index finger amputation  Right wrist/hand increased swelling; warm pain present. Very little movement of fingers.      Lymphadenopathy:     Cervical: No cervical adenopathy.  Skin:    General: Skin is warm and dry.  Neurological:     Mental Status: She is alert.  Psychiatric:        Mood and Affect: Mood normal.     ASSESSMENT/ PLAN:  TODAY:   1. Controlled type 2 diabetes mellitus with stage 3 chronic kidney disease with long term current use of insulin: her hgb a1c  4.7; she has had hypoglycemic glucose readings; will stop NPH at this time and will continue to monitor her status.     2. Stage 3 chronic kidney  disease due to type 2 diabetes mellitus/hydroureterophrosis: is without change bun 62; creat 1.70 will monitor   3. Acute gout due to renal impairment involving right wrist: is worse: will continue colchicine 0.3 mg daily allopurinol 50 mg daily will begin prednisone: 40 mg daily through 04-19-19  then 20 mg daily through 04-22-19 then 10 mg daily through 04-25-19 then 2.5 mg daily long term will monitor her status.   PREVIOUS  4. Anemia due to stage 3 chronic kidney disease: is without change hgb 8.8; will continue hemocyte daily   5. Protein calorie malnutrition severe: is stable albumin 2.7 will continue glucerna three times daily  6. Hypomagnesemia: is stable mag 1.9; will monitor   7. Chronic diastolic CHF (congestive heart failure)/demand ischemia  EF 50-55% (01-23-19): is stable  Will continue to monitor her status.   8. Atrial fibrillation chronic: heart rate is stable will continue eliquis 2.5 mg twice daily   9. Hypertensive heart and kidney disease with chronic diastolic congestive heart failure and stage 3 chronic kidney disease: is stable b/p 141/56: will continue norvasc 2.5 mg daily    MD is aware of resident's narcotic use and is in agreement with current plan of care. We will attempt to wean resident as apropriate   Ok Edwards NP [facility]  Contact [phone] Monday through Friday 8am- 5pm  After hours call [phone]

## 2019-04-22 ENCOUNTER — Other Ambulatory Visit (HOSPITAL_COMMUNITY)
Admission: RE | Admit: 2019-04-22 | Discharge: 2019-04-22 | Disposition: A | Discharge status: Home / Self Care | Payer: Medicare Other | Source: Ambulatory Visit | Attending: Adult Health | Admitting: Adult Health

## 2019-04-22 DIAGNOSIS — I130 Hypertensive heart and chronic kidney disease with heart failure and stage 1 through stage 4 chronic kidney disease, or unspecified chronic kidney disease: Secondary | ICD-10-CM | POA: Insufficient documentation

## 2019-04-22 LAB — BASIC METABOLIC PANEL
Anion gap: 9 (ref 5–15)
BUN: 84 mg/dL — ABNORMAL HIGH (ref 8–23)
CO2: 22 mmol/L (ref 22–32)
Calcium: 8.6 mg/dL — ABNORMAL LOW (ref 8.9–10.3)
Chloride: 105 mmol/L (ref 98–111)
Creatinine, Ser: 1.62 mg/dL — ABNORMAL HIGH (ref 0.44–1.00)
GFR calc Af Amer: 33 mL/min — ABNORMAL LOW (ref >60)
GFR calc non Af Amer: 29 mL/min — ABNORMAL LOW (ref >60)
Glucose, Bld: 158 mg/dL — ABNORMAL HIGH (ref 70–99)
Potassium: 4.9 mmol/L (ref 3.5–5.1)
Sodium: 136 mmol/L (ref 135–145)

## 2019-04-23 ENCOUNTER — Other Ambulatory Visit: Payer: Self-pay | Admitting: Adult Health

## 2019-04-23 ENCOUNTER — Encounter: Payer: Self-pay | Admitting: Adult Health

## 2019-04-23 ENCOUNTER — Non-Acute Institutional Stay (SKILLED_NURSING_FACILITY): Payer: Medicare Other | Admitting: Adult Health

## 2019-04-23 DIAGNOSIS — N183 Chronic kidney disease, stage 3 unspecified: Secondary | ICD-10-CM

## 2019-04-23 DIAGNOSIS — I5032 Chronic diastolic (congestive) heart failure: Secondary | ICD-10-CM

## 2019-04-23 DIAGNOSIS — M10331 Gout due to renal impairment, right wrist: Secondary | ICD-10-CM | POA: Diagnosis not present

## 2019-04-23 DIAGNOSIS — I130 Hypertensive heart and chronic kidney disease with heart failure and stage 1 through stage 4 chronic kidney disease, or unspecified chronic kidney disease: Secondary | ICD-10-CM

## 2019-04-23 MED ORDER — AMLODIPINE BESYLATE 2.5 MG PO TABS: 2.5000 mg | ORAL_TABLET | Freq: Every day | ORAL | 0 refills | Status: DC

## 2019-04-23 MED ORDER — PREDNISONE 10 MG PO TABS: 10.0000 mg | ORAL_TABLET | Freq: Every day | ORAL | 0 refills | Status: DC

## 2019-04-23 MED ORDER — FERROUS FUMARATE 325 (106 FE) MG PO TABS: 1.0000 | ORAL_TABLET | Freq: Every morning | ORAL | 0 refills | Status: AC

## 2019-04-23 MED ORDER — COLCHICINE 0.6 MG PO TABS: 0.3000 mg | ORAL_TABLET | Freq: Every day | ORAL | 0 refills | Status: AC

## 2019-04-23 MED ORDER — PREDNISONE 5 MG PO TABS: 2.5000 mg | ORAL_TABLET | Freq: Every day | ORAL | 0 refills | Status: DC

## 2019-04-23 MED ORDER — APIXABAN 2.5 MG PO TABS: 2.5000 mg | ORAL_TABLET | Freq: Two times a day (BID) | ORAL | 0 refills | Status: AC

## 2019-04-23 MED ORDER — ALLOPURINOL 100 MG PO TABS: 50.0000 mg | ORAL_TABLET | Freq: Every day | ORAL | 0 refills | Status: AC

## 2019-04-23 NOTE — Progress Notes (Signed)
Location:   [place] Room Number: 379 D Place of Service:  SNF (31)    CODE STATUS: Full Code  Allergies  Allergen Reactions  . Lactose Intolerance (Gi) Other (See Comments)    Upset stomach    Chief Complaint  Patient presents with  . Discharge Note    Discharging to home on 04/24/2019    HPI:  She is being discharged to home with home health for pt/ot/rn/cna. She does not need dme; she has all needed equipment. She will need her prescriptions filled and will need to follow up with her medical provider. She had been hospitalized for acute gout and sepsis due to uti. She was admitted to this facility for short term rehab. She will need to continue her therapy on a home health basis.    Past Medical History:  Diagnosis Date  . A-fib ([place])   . Anemia   . Arthritis    right knee  . Cataract   . CHF (congestive heart failure) ([place])   . CKD (chronic kidney disease), stage III ([place])   . Dehydration   . Diabetes mellitus   . Gout   . Headache(784.0)   . Hypertension   . UTI (lower urinary tract infection)     Past Surgical History:  Procedure Laterality Date  . ABDOMINAL HYSTERECTOMY    . AMPUTATION FINGER Left 01/22/2019   Procedure: Amputation Left Index Finger First Joint;  Surgeon: Iran Planas, MD;  Location: [facility];  Service: Orthopedics;  Laterality: Left;  . ANTERIOR AND POSTERIOR REPAIR  05/17/2011   Procedure: ANTERIOR (CYSTOCELE) AND POSTERIOR REPAIR (RECTOCELE);  Surgeon: Eli Hose, MD;  Location: [facility];  Service: Gynecology;  Laterality: N/A;  Anterior repair with TVT bladder sling and cystoscopy  . BLADDER SUSPENSION  05/17/2011   Procedure: TRANSVAGINAL TAPE (TVT) PROCEDURE;  Surgeon: Eli Hose, MD;  Location: [facility];  Service: Gynecology;  Laterality: N/A;  . BREAST SURGERY     breast biopsy  . I&D EXTREMITY Left 01/22/2019   Procedure: IRRIGATION AND DEBRIDEMENT OF LEFT INDEX FINGER,;  Surgeon: Iran Planas,  MD;  Location: [facility];  Service: Orthopedics;  Laterality: Left;  . IR FLUORO GUIDED NEEDLE PLC ASPIRATION/INJECTION LOC  04/05/2019    Social History   Socioeconomic History  . Marital status: Single    Spouse name: Not on file  . Number of children: Not on file  . Years of education: Not on file  . Highest education level: Not on file  Occupational History  . Not on file  Social Needs  . Financial resource strain: Not on file  . Food insecurity    Worry: Not on file    Inability: Not on file  . Transportation needs    Medical: Not on file    Non-medical: Not on file  Tobacco Use  . Smoking status: Never Smoker  . Smokeless tobacco: Never Used  Substance and Sexual Activity  . Alcohol use: No  . Drug use: No  . Sexual activity: Never    Birth control/protection: Abstinence  Lifestyle  . Physical activity    Days per week: Not on file    Minutes per session: Not on file  . Stress: Not on file  Relationships  . Social Herbalist on phone: Not on file    Gets together: Not on file    Attends religious service: Not on file    Active member of club or  organization: Not on file    Attends meetings of clubs or organizations: Not on file    Relationship status: Not on file  . Intimate partner violence    Fear of current or ex partner: Not on file    Emotionally abused: Not on file    Physically abused: Not on file    Forced sexual activity: Not on file  Other Topics Concern  . Not on file  Social History Narrative  . Not on file   History reviewed. No pertinent family history.  VITAL SIGNS BP 138/76   Pulse 82   Temp 97.9 F (36.6 C)   Resp 18   Ht 5\' 6"  (1.676 m)   Wt 153 lb 9.6 oz (69.7 kg)   BMI 24.79 kg/m   Patient's Medications  New Prescriptions   No medications on file  Previous Medications   ACETAMINOPHEN (TYLENOL) 500 MG TABLET    Take 500 mg by mouth every 6 (six) hours as needed for mild pain.    ALLOPURINOL (ZYLOPRIM) 100 MG TABLET     Take 50 mg by mouth daily.   AMLODIPINE (NORVASC) 2.5 MG TABLET    Take 1 tablet (2.5 mg total) by mouth daily.   APIXABAN (ELIQUIS) 2.5 MG TABS TABLET    Take 2.5 mg by mouth 2 (two) times daily.   COLCHICINE 0.6 MG TABLET    Take 0.5 tablets (0.3 mg total) by mouth daily.   FEEDING SUPPLEMENT, GLUCERNA SHAKE, (GLUCERNA SHAKE) LIQD    Take 237 mLs by mouth 3 (three) times daily between meals.   FERROUS FUMARATE (HEMOCYTE - 106 MG FE) 325 (106 FE) MG TABS TABLET    Take 1 tablet by mouth every morning.    NON FORMULARY    Diet type:  NAS, low potassium diet   OLOPATADINE (PATANOL) 0.1 % OPHTHALMIC SOLUTION    Place 1 drop into both eyes 2 (two) times daily.   SENNOSIDES-DOCUSATE SODIUM (SENOKOT-S) 8.6-50 MG TABLET    Take 2 tablets by mouth at bedtime.  Modified Medications   No medications on file  Discontinued Medications   No medications on file     SIGNIFICANT DIAGNOSTIC EXAMS  PREVIOUS:   03-25-19: chest x-ray: no active disease  03-25-19: ct of abdomen and pelvis:  1. Extensive colonic diverticulosis without evidence to suggest an acute diverticulitis at this time. 2. Mild left hydroureteronephrosis terminating in the proximal third of the left ureter. No obstructing stone identified. The possibility of a ureteral stricture could be considered. 3. Small amount of gas non dependently in the urinary bladder. This may be iatrogenic if there has been recent catheterization. In the absence of catheterization, correlation with urinalysis is suggested to exclude the possibility of urinary tract infection with gas-forming organisms. 4. Aortic atherosclerosis. 5. Mild cardiomegaly. 6. Trace amount of biliary sludge and/or tiny gallstones lying dependently in the gallbladder. No findings to suggest an acute cholecystitis at this time.  03-25-19: ct of head: chronic atrophy and ischemic changes without acute abnormality.   04-02-19: right wrist x-ray: chronic changes without acute abnormality    NO NEW EXAMS.   LABS REVIEWED PREVIOUS:   03-25-19: wbc 7.9; hgb 9.2; hct 29.5; mcv 90.8 plt 174; glucose 82; bun 45; creat 1.57; k+ 3.7; na++ 144; ca 9.4; liver normal albumin 3.6 BNP 736.3 blood culture: e-coli/enterobacteriaceae species urine culture: e-coli klebsiella pneumoniae  03-28-19" wbc 7.4; hgb 8.4; ht 26.5; mcv 90.8; plt 171 glucose 79; bun 36; creat 1.37; k+ 4.6;  na++ 141; ca 9.5  03-30-19: wbc 8.9; hgb 8.9; hct 28.2; mcv 87.9 ;plt 208 glucose 116; bun 24; creat 1.26; k+ 4.2; na++ 135; ca 9.3 phos 2.0; albumin 3.1 mag 1.5  04-01-19: wbc 13.3; hgb 8.7; hct 27.2; mcv 87.2; plt 269; glucose 117; bun 24; creat 1.42; k+ 4.3; na++ 135; ca 9.3 phos 2.2; albumin 2.6; mag 1.6 04-03-19: wbc 8.3; hgb 8.8; hct 28.3; mcv 89.0; plt 414; glucose 105; bun 39; creat 1.41; k+ 4.8; na++ 133; ca 9.4 liver normal albumin 2.7 04-04-19: mag 1.9   04-06-19: glucose 97; bun 47; creat 1.59; k+ 4.7; na++ 137; ca 9.2 04-07-19: glucose 96; bun 47; creat 1.58; k+ 4.7 na++ 137; ca 9.0  04-11-19: wbc 8.1; hgb 7.6; hct 25.8; mcv 93.5; plt 658; glucose 76; bun 69; creat 1.56; k+ 5.5; na++ 137; ca 9.3; uric acid 8.5; hgb a1c 4.7  04-14-19: hgb 7.7; hct 26.2  glucose 55; bun 61; creat 1.66; k+ 4.6; na++ 140; ca 9.3;  04-16-19: hgb 8.6; hct 29.5; glucose 87; bun 69; creat 1.47; k+ 5.7; na++ 143; ca 8.9  04-17-19: glucose 46 bun 62; creat 1.70; k+ 4.6; na++ 143; ca 8.4   NO NEW LABS.    Review of Systems  Constitutional: Negative for malaise/fatigue.  Respiratory: Negative for cough and shortness of breath.   Cardiovascular: Negative for chest pain, palpitations and leg swelling.  Gastrointestinal: Negative for abdominal pain, constipation and heartburn.  Musculoskeletal: Positive for joint pain. Negative for back pain and myalgias.       Right wrist is "sore"  Skin: Negative.   Neurological: Negative for dizziness.  Psychiatric/Behavioral: The patient is not nervous/anxious.      Physical Exam Constitutional:       General: She is not in acute distress.    Appearance: She is well-developed. She is not diaphoretic.  Neck:     Musculoskeletal: Neck supple.     Thyroid: No thyromegaly.  Cardiovascular:     Rate and Rhythm: Normal rate and regular rhythm.     Pulses: Normal pulses.     Heart sounds: Murmur present.     Comments: 2/6 Pulmonary:     Effort: Pulmonary effort is normal. No respiratory distress.     Breath sounds: Normal breath sounds.  Abdominal:     General: Bowel sounds are normal. There is no distension.     Palpations: Abdomen is soft.     Tenderness: There is no abdominal tenderness.  Musculoskeletal:     Right lower leg: No edema.     Left lower leg: No edema.     Comments:  Is able to move all extremities Status post left index finger amputation  Right wrist/hand reduced swelling; has some improved movement present   Lymphadenopathy:     Cervical: No cervical adenopathy.  Skin:    General: Skin is warm and dry.  Neurological:     Mental Status: She is alert. Mental status is at baseline.  Psychiatric:        Mood and Affect: Mood normal.      ASSESSMENT/ PLAN:   Patient is being discharged with the following home health services:  Pt/ot/rn/cna: to evaluate and treat as indicated for gait balance strength adl training medication management and adl care.   Patient is being discharged with the following durable medical equipment:  None needed   Patient has been advised to f/u with their PCP in 1-2 weeks to bring them up to date on their rehab  stay.  Social services at facility was responsible for arranging this appointment.  Pt was provided with a 30 day supply of prescriptions for medications and refills must be obtained from their PCP.  For controlled substances, a more limited supply may be provided adequate until PCP appointment only.  A 30 day supply of prescription medications have been sent to [facility]   Time spent with patient: 35 minutes:  medications; home health needs; dme.    Ok Edwards NP [facility]  Contact [phone] Monday through Friday 8am- 5pm  After hours call [phone]

## 2019-04-25 ENCOUNTER — Emergency Department (HOSPITAL_COMMUNITY)
Admission: EM | Admit: 2019-04-25 | Discharge: 2019-04-29 | Disposition: A | Discharge status: Home / Self Care | Payer: Medicare Other | Attending: Emergency Medicine | Admitting: Emergency Medicine

## 2019-04-25 ENCOUNTER — Emergency Department (HOSPITAL_COMMUNITY): Payer: Medicare Other

## 2019-04-25 ENCOUNTER — Other Ambulatory Visit: Payer: Self-pay

## 2019-04-25 ENCOUNTER — Encounter (HOSPITAL_COMMUNITY): Payer: Self-pay

## 2019-04-25 DIAGNOSIS — Z7901 Long term (current) use of anticoagulants: Secondary | ICD-10-CM | POA: Diagnosis not present

## 2019-04-25 DIAGNOSIS — R531 Weakness: Secondary | ICD-10-CM | POA: Diagnosis not present

## 2019-04-25 DIAGNOSIS — M545 Low back pain, unspecified: Secondary | ICD-10-CM

## 2019-04-25 DIAGNOSIS — Z79899 Other long term (current) drug therapy: Secondary | ICD-10-CM | POA: Insufficient documentation

## 2019-04-25 DIAGNOSIS — R410 Disorientation, unspecified: Secondary | ICD-10-CM | POA: Diagnosis not present

## 2019-04-25 DIAGNOSIS — N183 Chronic kidney disease, stage 3 (moderate): Secondary | ICD-10-CM | POA: Insufficient documentation

## 2019-04-25 DIAGNOSIS — Z03818 Encounter for observation for suspected exposure to other biological agents ruled out: Secondary | ICD-10-CM | POA: Diagnosis not present

## 2019-04-25 DIAGNOSIS — I130 Hypertensive heart and chronic kidney disease with heart failure and stage 1 through stage 4 chronic kidney disease, or unspecified chronic kidney disease: Secondary | ICD-10-CM | POA: Diagnosis not present

## 2019-04-25 DIAGNOSIS — F0390 Unspecified dementia without behavioral disturbance: Secondary | ICD-10-CM | POA: Insufficient documentation

## 2019-04-25 DIAGNOSIS — E1122 Type 2 diabetes mellitus with diabetic chronic kidney disease: Secondary | ICD-10-CM | POA: Diagnosis not present

## 2019-04-25 DIAGNOSIS — M549 Dorsalgia, unspecified: Secondary | ICD-10-CM | POA: Diagnosis not present

## 2019-04-25 DIAGNOSIS — R609 Edema, unspecified: Secondary | ICD-10-CM | POA: Diagnosis not present

## 2019-04-25 DIAGNOSIS — M25531 Pain in right wrist: Secondary | ICD-10-CM | POA: Diagnosis not present

## 2019-04-25 DIAGNOSIS — G8929 Other chronic pain: Secondary | ICD-10-CM | POA: Diagnosis not present

## 2019-04-25 DIAGNOSIS — M7989 Other specified soft tissue disorders: Secondary | ICD-10-CM | POA: Diagnosis not present

## 2019-04-25 DIAGNOSIS — I5032 Chronic diastolic (congestive) heart failure: Secondary | ICD-10-CM | POA: Diagnosis not present

## 2019-04-25 DIAGNOSIS — N390 Urinary tract infection, site not specified: Secondary | ICD-10-CM | POA: Diagnosis not present

## 2019-04-25 DIAGNOSIS — R627 Adult failure to thrive: Secondary | ICD-10-CM | POA: Diagnosis not present

## 2019-04-25 DIAGNOSIS — R404 Transient alteration of awareness: Secondary | ICD-10-CM | POA: Diagnosis not present

## 2019-04-25 DIAGNOSIS — M5489 Other dorsalgia: Secondary | ICD-10-CM | POA: Diagnosis not present

## 2019-04-25 LAB — URINALYSIS, ROUTINE W REFLEX MICROSCOPIC
Bilirubin Urine: NEGATIVE
Glucose, UA: NEGATIVE mg/dL
Ketones, ur: NEGATIVE mg/dL
Nitrite: NEGATIVE
Protein, ur: NEGATIVE mg/dL
Specific Gravity, Urine: 1.009 (ref 1.005–1.030)
pH: 6 (ref 5.0–8.0)

## 2019-04-25 MED ORDER — FENTANYL CITRATE (PF) 100 MCG/2ML IJ SOLN
50.0000 ug | Freq: Once | INTRAMUSCULAR | Status: AC
  Administered 2019-04-25: 50 ug via INTRAVENOUS
  Filled 2019-04-25: qty 2

## 2019-04-25 NOTE — ED Notes (Signed)
Call to [place] to await [facility] notification

## 2019-04-25 NOTE — ED Notes (Signed)
Pt in hospital bed  Comfort measures

## 2019-04-25 NOTE — ED Notes (Signed)
Hospital bed request to [place] , [facility]

## 2019-04-25 NOTE — Clinical Social Work Note (Addendum)
Spoke with Courtney Bradford at [facility]. They are open to readmitting patient IF a) PT recommends SNF, b) if [facility] authorizes another stay, or c) family is willing to self pay if no authorization.  PT order put in by nursing, CSW to call son to go over options.  [facility] called to say they spoke with son, who is asking for LTC.  They are declining referral.  CSW to follow up with son to discuss options.   Courtney Bradford is adamant that his mother return to [facility].  He states he will call me back in 2 hours-12:30- after he has a chance to appeal their decision and mek some other phone calls.  He has also made it clear he has no intention of taking his mother home from ED as he is clear from last night's experience that he and the folks he had lined up to help are physically incapable of caring for her.  I stressed the importance of [place] working together to find [facility] funded placement ASAP.  12:45  Informed by Courtney Bradford that [facility] is willing to take patient back.  When they get the PT note, they will request authorization from insurance.  Pt will likely not move until Monday.  4:20  Spoke to [facility], who retracted previous statement that they would bring patient in under [facility].  Courtney Bradford has applied for authorization for SNF rehab from [facility], with help of PT note recommending SNF for rehab.  Will likely not hear until Monday at the earliest.

## 2019-04-25 NOTE — ED Notes (Signed)
Courtney Bradford, SW down to inform that [facility] will not accept under medicaid  Pt will there fore be boarded in ED until Monday when she will hopefully be accepted and transferred to [facility]

## 2019-04-25 NOTE — ED Notes (Signed)
Rad at bedside.

## 2019-04-25 NOTE — ED Notes (Signed)
Bed changed, pt has been incontinent of urine on the floor   Returned to bed by PT   Med requested and provided   Bed changed   Awaiting dispo information

## 2019-04-25 NOTE — ED Provider Notes (Signed)
[facility] EMERGENCY DEPARTMENT Provider Note   CSN: [id number] Arrival date & time: 04/25/19  0700    History   Chief Complaint Chief Complaint  Patient presents with   Back Pain    HPI TANDI HANKO is a 85 y.o. female.     HPI  85 year old female, she had a recent admission to the hospital, where she was admitted for sepsis from a urinary tract infection of both E. coli and Klebsiella according to my review of the electronic medical record.  Information is obtained today from the paramedics, the family member at home and the patient.  The patient does not know why she is here except for stating that her back hurts, she is unclear why it hurts, the paramedics report that there was a possible fall though there is no context around this and there is no family to corroborate this on the patient's arrival.  There was also a report from the paramedics that the patient was having difficulty ambulating due to weakness after being in rehab at the [facility] for the last 2-1/2 weeks.  The last note from the [facility] was dated June 24 approximately 3 days ago.  This is the date of discharge.  The patient is not very forthcoming with information, level 5 caveat applies secondary to the patient being a severely poor historian.  Information was primarily gathered from the medical record showing the recent admission for sepsis as well as uncontrolled gout of the right wrist which was limited in treatment secondary to the patient's history of renal chronic insufficiency.  Past Medical History:  Diagnosis Date   A-fib ([place])    Anemia    Arthritis    right knee   Cataract    CHF (congestive heart failure) ([place])    CKD (chronic kidney disease), stage III ([place])    Dehydration    Diabetes mellitus    Gout    Headache(784.0)    Hypertension    UTI (lower urinary tract infection)     Patient Active Problem List   Diagnosis Date Noted   Dementia ([place]) 04/10/2019    Hypertensive heart and kidney disease with chronic diastolic congestive heart failure and stage 3 chronic kidney disease ([place]) 04/08/2019   Stage 3 chronic kidney disease due to type 2 diabetes mellitus ([place]) 04/08/2019   Protein-calorie malnutrition, severe ([place]) 04/08/2019   Acute gout of right wrist 04/02/2019   Anemia due to chronic kidney disease 04/02/2019   Amputation of left index finger 04/02/2019   CKD (chronic kidney disease), stage III ([place]) 03/26/2019   Hypomagnesemia 03/26/2019   Hydroureteronephrosis 03/26/2019   Elevated troponin 03/26/2019   Demand ischemia ([place]) 03/26/2019   Sinus bradycardia 03/26/2019   First degree AV block 03/26/2019   Aortic atherosclerosis ([place]) 03/26/2019   Mild cardiomegaly 03/26/2019   Mobitz type 2 second degree atrioventricular block    Sepsis ([place]) 01/22/2019   Acute encephalopathy 02/28/2016   Lower urinary tract infectious disease    Generalized weakness 11/11/2015   Hyperkalemia 11/11/2015   Gout    UTI (urinary tract infection) 07/23/2014   Chronic diastolic CHF (congestive heart failure) ([place]) 07/22/2014   Atrial fibrillation, chronic ([place]) 07/22/2014   Type 2 diabetes, controlled, with renal manifestation ([place]) 07/22/2014   Essential hypertension 11/03/2009   PREMATURE VENTRICULAR CONTRACTIONS 11/03/2009    Past Surgical History:  Procedure Laterality Date   ABDOMINAL HYSTERECTOMY     AMPUTATION FINGER Left 01/22/2019   Procedure: Amputation Left Index Finger  First Joint;  Surgeon: Iran Planas, MD;  Location: [place];  Service: Orthopedics;  Laterality: Left;   ANTERIOR AND POSTERIOR REPAIR  05/17/2011   Procedure: ANTERIOR (CYSTOCELE) AND POSTERIOR REPAIR (RECTOCELE);  Surgeon: Eli Hose, MD;  Location: [facility];  Service: Gynecology;  Laterality: N/A;  Anterior repair with TVT bladder sling and cystoscopy   BLADDER SUSPENSION  05/17/2011   Procedure: TRANSVAGINAL TAPE (TVT) PROCEDURE;   Surgeon: Eli Hose, MD;  Location: [facility];  Service: Gynecology;  Laterality: N/A;   BREAST SURGERY     breast biopsy   I&D EXTREMITY Left 01/22/2019   Procedure: IRRIGATION AND DEBRIDEMENT OF LEFT INDEX FINGER,;  Surgeon: Iran Planas, MD;  Location: [place];  Service: Orthopedics;  Laterality: Left;   IR FLUORO GUIDED NEEDLE PLC ASPIRATION/INJECTION LOC  04/05/2019     OB History   No obstetric history on file.      Home Medications    Prior to Admission medications   Medication Sig Start Date End Date Taking? Authorizing Provider  acetaminophen (TYLENOL) 500 MG tablet Take 500 mg by mouth every 6 (six) hours as needed for mild pain.     [provider]  allopurinol (ZYLOPRIM) 100 MG tablet Take 0.5 tablets (50 mg total) by mouth daily. 04/23/19   Gerlene Fee, NP  amLODipine (NORVASC) 2.5 MG tablet Take 1 tablet (2.5 mg total) by mouth daily. 04/23/19   Gerlene Fee, NP  apixaban (ELIQUIS) 2.5 MG TABS tablet Take 1 tablet (2.5 mg total) by mouth 2 (two) times daily. 04/23/19   Gerlene Fee, NP  colchicine 0.6 MG tablet Take 0.5 tablets (0.3 mg total) by mouth daily. 04/23/19   Gerlene Fee, NP  feeding supplement, GLUCERNA SHAKE, (GLUCERNA SHAKE) LIQD Take 237 mLs by mouth 3 (three) times daily between meals. 04/07/19   Lavina Hamman, MD  ferrous fumarate (HEMOCYTE - 106 MG FE) 325 (106 Fe) MG TABS tablet Take 1 tablet (106 mg of iron total) by mouth every morning. 04/23/19   Gerlene Fee, NP  furosemide (LASIX) 20 MG tablet Take 10 mg by mouth daily. 04/21/19   [provider]  metoprolol tartrate (LOPRESSOR) 25 MG tablet Take 12.5 mg by mouth daily. 04/21/19   [provider]  NON FORMULARY Diet type:  NAS, low potassium diet 04/07/19   [provider]  olopatadine (PATANOL) 0.1 % ophthalmic solution Place 1 drop into both eyes 2 (two) times daily. 01/25/19   Raiford Noble Latif, DO  predniSONE (DELTASONE) 10 MG tablet Take 1  tablet (10 mg total) by mouth daily with breakfast. 04/25/19   Gerlene Fee, NP  predniSONE (DELTASONE) 5 MG tablet Take 0.5 tablets (2.5 mg total) by mouth daily with breakfast. 04/23/19   Gerlene Fee, NP  sennosides-docusate sodium (SENOKOT-S) 8.6-50 MG tablet Take 2 tablets by mouth at bedtime. 04/07/19   [provider]    Family History No family history on file.  Social History Social History   Tobacco Use   Smoking status: Never Smoker   Smokeless tobacco: Never Used  Substance Use Topics   Alcohol use: No   Drug use: No     Allergies   Lactose intolerance (gi)   Review of Systems Review of Systems  Unable to perform ROS: Other     Physical Exam Updated Vital Signs BP 139/60    Pulse (!) 52    Temp 98.9 F (37.2 C) (Oral)  Resp 12    SpO2 98%   Physical Exam Vitals signs and nursing note reviewed.  Constitutional:      General: She is not in acute distress.    Appearance: She is well-developed.  HENT:     Head: Normocephalic and atraumatic.     Mouth/Throat:     Mouth: Mucous membranes are moist.     Pharynx: No oropharyngeal exudate.  Eyes:     General: No scleral icterus.       Right eye: No discharge.        Left eye: No discharge.     Conjunctiva/sclera: Conjunctivae normal.     Pupils: Pupils are equal, round, and reactive to light.  Neck:     Musculoskeletal: Normal range of motion and neck supple.     Thyroid: No thyromegaly.     Vascular: No JVD.  Cardiovascular:     Rate and Rhythm: Normal rate and regular rhythm.     Heart sounds: Normal heart sounds. No murmur. No friction rub. No gallop.   Pulmonary:     Effort: Pulmonary effort is normal. No respiratory distress.     Breath sounds: Normal breath sounds. No wheezing or rales.  Abdominal:     General: Bowel sounds are normal. There is no distension.     Palpations: Abdomen is soft. There is no mass.     Tenderness: There is no abdominal tenderness.    Musculoskeletal: Normal range of motion.        General: Swelling and tenderness present.     Comments: There is focal swelling and tenderness of the right wrist and hand, this is the same location as noted in the record where the patient had gout.  Tender to palpation and movement, no redness or warmth.  There is also mild tenderness over the right lower back  Lymphadenopathy:     Cervical: No cervical adenopathy.  Skin:    General: Skin is warm and dry.     Findings: No erythema or rash.  Neurological:     Mental Status: She is alert.     Coordination: Coordination normal.     Comments: The patient has no obvious facial droop, she is able to detect light touch sensation bilaterally to the face arms and legs without any asymmetry.  She is able to lift both arms though her right arm is somewhat weak, her right leg is also somewhat weak compared to the left side where there is normal strength.  She can straight leg raise on the right but it is 4+ out of 5 compared to 5 out of 5 on the left.  She is able to roll herself over in the bed.  She is able to answer questions though she is a very poor historian  Psychiatric:        Behavior: Behavior normal.      ED Treatments / Results  Labs (all labs ordered are listed, but only abnormal results are displayed) Labs Reviewed  URINALYSIS, ROUTINE W REFLEX MICROSCOPIC - Abnormal; Notable for the following components:      Result Value   Color, Urine STRAW (*)    Hgb urine dipstick SMALL (*)    Leukocytes,Ua SMALL (*)    Bacteria, UA RARE (*)    All other components within normal limits  URINE CULTURE  CBC WITH DIFFERENTIAL/PLATELET    EKG EKG Interpretation  Date/Time:  Friday April 25 2019 07:24:02 EDT Ventricular Rate:  74 PR Interval:  QRS Duration: 92 QT Interval:  343 QTC Calculation: 381 R Axis:   60 Text Interpretation:  Atrial fibrillation Ventricular premature complex Low voltage, precordial leads Probable anteroseptal  infarct, old hx of same Confirmed by Noemi Chapel [id number]) on 04/25/2019 7:43:47 AM   Radiology Dg Lumbar Spine Complete  Result Date: 04/25/2019 CLINICAL DATA:  Back pain after fall EXAM: LUMBAR SPINE - COMPLETE 4+ VIEW COMPARISON:  None. FINDINGS: Scoliosis of the lumbar spine. No acute loss vertebral body height. There is multiple levels of endplate spurring and sclerosis. No subluxation identified IMPRESSION: 1. Scoliosis and associated disc osteophytic disease. 2. No acute findings in the lumbar spine identified. Electronically Signed   By: Suzy Bouchard M.D.   On: 04/25/2019 08:36   Dg Forearm Right  Result Date: 04/25/2019 CLINICAL DATA:  Swollen right arm. EXAM: RIGHT FOREARM - 2 VIEW COMPARISON:  No recent prior. FINDINGS: No acute bony or joint abnormality identified. No evidence of fracture dislocation. Diffuse osteopenia degenerative change. Diffuse soft tissue swelling. Peripheral vascular calcification. IMPRESSION: 1. Diffuse osteopenia and degenerative change. No acute bony abnormality. 2.  Diffuse soft tissue swelling. 3.  Peripheral vascular disease. Electronically Signed   By: Marcello Moores  Register   On: 04/25/2019 13:35   Ct Head Wo Contrast  Result Date: 04/25/2019 CLINICAL DATA:  85 year old female with right side weakness, back pain. EXAM: CT HEAD WITHOUT CONTRAST TECHNIQUE: Contiguous axial images were obtained from the base of the skull through the vertex without intravenous contrast. COMPARISON:  Head CTs 03/25/2019, 02/29/2016. FINDINGS: Brain: Confluent bilateral white matter hypodensity and extensive heterogeneous hypodensity in the bilateral deep gray matter nuclei, stable. No acute intracranial hemorrhage identified. No midline shift, mass effect, or evidence of intracranial mass lesion. No ventriculomegaly. No cortically based acute infarct identified. No cortical encephalomalacia identified. Vascular: Calcified atherosclerosis at the skull base. No suspicious intracranial  vascular hyperdensity. Skull: No acute osseous abnormality identified. Sinuses/Orbits: Visualized paranasal sinuses and mastoids are stable and well pneumatized. Other: Visualized orbits and scalp soft tissues are within normal limits. IMPRESSION: No acute intracranial abnormality identified. Stable CT appearance of advanced chronic small vessel disease. Electronically Signed   By: Genevie Ann M.D.   On: 04/25/2019 08:14    Procedures Procedures (including critical care time)  Medications Ordered in ED Medications  fentaNYL (SUBLIMAZE) injection 50 mcg (50 mcg Intravenous Given 04/25/19 1438)     Initial Impression / Assessment and Plan / ED Course  I have reviewed the triage vital signs and the nursing notes.  Pertinent labs & imaging results that were available during my care of the patient were reviewed by me and considered in my medical decision making (see chart for details).  Clinical Course as of Apr 24 1552  Fri Apr 25, 2019  0741 I was able to get a hold of the son who came to see his mother and reports that she has been home for less than 24 hours, she is unable to stand or even assist with her own movement, she was found on the floor beside the bed, when he picked her up from the nursing home yesterday it was very easily recognizable that taking 3 people to get her both into and out of the car was going to be too much and that she would likely need to go into long-term care.  We will consult with social work for the same.   [BM]  0804 Urinalysis shows 6-10 white blood cells with rare bacteria.  Given recent antibiotics and  no symptoms would avoid treatment with antibiotics until culture necessitates.  Patient is not febrile nor she tachycardic.   [BM]  [phone] I have personally looked at both the CT scan of the brain as well as the imaging of the lumbar spine and my interpretation is that there is no signs of intracranial hemorrhage or obvious ischemic stroke or trauma, there is no evidence  of acute fracture although there is severe scoliosis of the lumbar spine which limits interpretation.   [BM]  0808 Social consulted for assistance with placement    [BM]  1552  Physical therapy has Been ordered, they will see the patient and the patient will work on being placed likely back at the [facility], no findings of significance on imaging or labs   [BM]  1552  Radiology evaluation of the right forearm and wrist also appears to have soft tissue swelling but no signs of fracture   [BM]    Clinical Course User Index [BM] Noemi Chapel, MD      The patient's exam is significant for some right-sided weakness of both the arm and the leg, the etiology of this is questionable.  She is on Eliquis for atrial fibrillation, she also has some back tenderness and there is a possible reported fall though I do not have any collaborative information at this time.  Will perform some basic imaging and talk to family to see if this is a case of too weak to walk, potential fall with pain or some other problem.  Otherwise she is afebrile, she is not tachycardic and she is not hypotensive nor is she febrile to touch  At change of shift, care signed out to Dr. Thurnell Garbe pending placement  Final Clinical Impressions(s) / ED Diagnoses   Final diagnoses:  Failure to thrive in adult  Low back pain without sciatica, unspecified back pain laterality, unspecified chronicity    ED Discharge Orders    None       Noemi Chapel, MD 04/25/19 1553

## 2019-04-25 NOTE — ED Notes (Signed)
Pt ate everything on her dinner tray.

## 2019-04-25 NOTE — Plan of Care (Signed)
  Problem: Acute Rehab PT Goals(only PT should resolve) Goal: Pt Will Go Supine/Side To Sit Outcome: Progressing Flowsheets (Taken 04/25/2019 1458) Pt will go Supine/Side to Sit: with moderate assist Goal: Patient Will Transfer Sit To/From Stand Outcome: Progressing Flowsheets (Taken 04/25/2019 1458) Patient will transfer sit to/from stand:  with moderate assist  with maximum assist Goal: Pt Will Transfer Bed To Chair/Chair To Bed Outcome: Progressing Flowsheets (Taken 04/25/2019 1458) Pt will Transfer Bed to Chair/Chair to Bed:  with mod assist  with max assist Goal: Pt Will Ambulate Outcome: Progressing Flowsheets (Taken 04/25/2019 1458) Pt will Ambulate:  15 feet  with moderate assist  with rolling walker   2:58 PM, 04/25/19 Lonell Grandchild, MPT Physical Therapist with [facility] [phone] office [phone] mobile phone

## 2019-04-25 NOTE — Evaluation (Signed)
Physical Therapy Evaluation Patient Details Name: Courtney Bradford MRN: [id number] DOB: 1931-06-08 Today's Date: 04/25/2019   History of Present Illness  Pt c/o back pain.   Recently discharged from [facility].  EDP reports pt has r sided weakness on exam  Clinical Impression  Patient agreeable for therapy with encouragement, demonstrates slow labored movement for sitting up at bedside, unable to complete sit to stands due to RUE weakness and poor carryover for proper hand and foot placement to complete sit to stands, very apprehensive and requires much time to attempt functional tasks.  Patient put back to bed with Max assist to reposition.  Patient will benefit from continued physical therapy in hospital and recommended venue below to increase strength, balance, endurance for safe ADLs and gait.    Follow Up Recommendations SNF    Equipment Recommendations  None recommended by PT    Recommendations for Other Services       Precautions / Restrictions Precautions Precautions: Fall Restrictions Weight Bearing Restrictions: No      Mobility  Bed Mobility Overal bed mobility: Needs Assistance Bed Mobility: Supine to Sit;Sit to Supine Rolling: Max assist   Supine to sit: Max assist Sit to supine: Max assist   General bed mobility comments: increased time, labored movement  Transfers                    Ambulation/Gait                Stairs            Wheelchair Mobility    Modified Rankin (Stroke Patients Only)       Balance Overall balance assessment: Needs assistance Sitting-balance support: Feet supported;No upper extremity supported Sitting balance-Leahy Scale: Fair Sitting balance - Comments: seated at bedside                                     Pertinent Vitals/Pain Pain Assessment: Faces Faces Pain Scale: Hurts even more Pain Location: low back, right arm Pain Intervention(s): Limited activity within patient's  tolerance;Monitored during session;Patient requesting pain meds-RN notified    Home Living Family/patient expects to be discharged to:: Private residence   Available Help at Discharge: Family;Personal care attendant Type of Home: House Home Access: Stairs to enter Entrance Stairs-Rails: None Entrance Stairs-Number of Steps: 1 Home Layout: One level Home Equipment: Environmental consultant - 2 wheels;Cane - single point      Prior Function Level of Independence: Needs assistance   Gait / Transfers Assistance Needed: Used RW for mobility and aide would assist with mobility tasks.   ADL's / Homemaking Assistance Needed: Reports aide assisted with ADLs        Hand Dominance   Dominant Hand: Right    Extremity/Trunk Assessment   Upper Extremity Assessment Upper Extremity Assessment: Generalized weakness RUE Deficits / Details: grossly -3/5    Lower Extremity Assessment Lower Extremity Assessment: Generalized weakness    Cervical / Trunk Assessment Cervical / Trunk Assessment: Lordotic  Communication   Communication: No difficulties  Cognition Arousal/Alertness: Awake/alert Behavior During Therapy: Flat affect Overall Cognitive Status: No family/caregiver present to determine baseline cognitive functioning Area of Impairment: Problem solving                 Orientation Level: Person     Following Commands: Follows one step commands with increased time     Problem Solving: Slow processing General  Comments: patient apprehensive to attempt sit to stands      General Comments      Exercises     Assessment/Plan    PT Assessment Patient needs continued PT services  PT Problem List Decreased strength;Decreased activity tolerance;Decreased balance;Decreased mobility       PT Treatment Interventions Gait training;Stair training;Functional mobility training;Therapeutic activities;Therapeutic exercise;Patient/family education    PT Goals (Current goals can be found in the  Care Plan section)  Acute Rehab PT Goals Patient Stated Goal: return home PT Goal Formulation: With patient Time For Goal Achievement: 05/09/19    Frequency Min 2X/week   Barriers to discharge        Co-evaluation               AM-PAC PT "6 Clicks" Mobility  Outcome Measure Help needed turning from your back to your side while in a flat bed without using bedrails?: A Lot Help needed moving from lying on your back to sitting on the side of a flat bed without using bedrails?: A Lot Help needed moving to and from a bed to a chair (including a wheelchair)?: Total Help needed standing up from a chair using your arms (e.g., wheelchair or bedside chair)?: Total Help needed to walk in hospital room?: Total Help needed climbing 3-5 steps with a railing? : Total 6 Click Score: 8    End of Session   Activity Tolerance: Patient limited by fatigue;Patient limited by pain Patient left: in bed;with call bell/phone within reach;with nursing/sitter in room Nurse Communication: Mobility status PT Visit Diagnosis: Unsteadiness on feet (R26.81);Other abnormalities of gait and mobility (R26.89);Muscle weakness (generalized) (M62.81)    Time: 1400-1431 PT Time Calculation (min) (ACUTE ONLY): 31 min   Charges:   PT Evaluation $PT Eval Moderate Complexity: 1 Mod PT Treatments $Therapeutic Activity: 23-37 mins        2:56 PM, 04/25/19 Lonell Grandchild, MPT Physical Therapist with [facility] [phone] office [phone] mobile phone

## 2019-04-25 NOTE — ED Triage Notes (Signed)
Pt c/o back pain.   Recently discharged from [facility].  EDP reports pt has r sided weakness on exam.

## 2019-04-25 NOTE — ED Notes (Signed)
Per Barbaraann Rondo, SW pt will probably be held thru the weekend in ED  States he is awaiting PT report for authorization for [facility] placement that pt son has stated is arranged   ED staff has not been apprised but will as this unfolds as assured by Barbaraann Rondo, SW so that hospital bed can be provided for pt should she not be placed expidiciously

## 2019-04-25 NOTE — ED Notes (Addendum)
PT in with pt.

## 2019-04-25 NOTE — ED Notes (Signed)
Pt hand fed per family request   Per son, pt is accepted at the [facility] for SW to be informed

## 2019-04-26 DIAGNOSIS — R627 Adult failure to thrive: Secondary | ICD-10-CM | POA: Diagnosis not present

## 2019-04-26 MED ORDER — APIXABAN 2.5 MG PO TABS
2.5000 mg | ORAL_TABLET | Freq: Two times a day (BID) | ORAL | Status: DC
  Administered 2019-04-26 – 2019-04-29 (×7): 2.5 mg via ORAL
  Filled 2019-04-26 (×11): qty 1

## 2019-04-26 MED ORDER — METOPROLOL TARTRATE 25 MG PO TABS
12.5000 mg | ORAL_TABLET | Freq: Every day | ORAL | Status: DC
  Administered 2019-04-26 – 2019-04-29 (×4): 12.5 mg via ORAL
  Filled 2019-04-26 (×4): qty 1

## 2019-04-26 MED ORDER — ACETAMINOPHEN 500 MG PO TABS
500.0000 mg | ORAL_TABLET | Freq: Four times a day (QID) | ORAL | Status: DC | PRN
  Administered 2019-04-26 – 2019-04-29 (×5): 500 mg via ORAL
  Filled 2019-04-26 (×6): qty 1

## 2019-04-26 MED ORDER — AMLODIPINE BESYLATE 5 MG PO TABS
2.5000 mg | ORAL_TABLET | Freq: Every day | ORAL | Status: DC
  Administered 2019-04-26 – 2019-04-29 (×4): 2.5 mg via ORAL
  Filled 2019-04-26 (×4): qty 1

## 2019-04-27 DIAGNOSIS — R627 Adult failure to thrive: Secondary | ICD-10-CM | POA: Diagnosis not present

## 2019-04-27 LAB — URINE CULTURE: Culture: >=100000 — AB

## 2019-04-27 MED ORDER — ACETAMINOPHEN 325 MG PO TABS
650.0000 mg | ORAL_TABLET | Freq: Once | ORAL | Status: AC
  Administered 2019-04-27: 650 mg via ORAL
  Filled 2019-04-27: qty 2

## 2019-04-27 MED ORDER — SODIUM CHLORIDE 0.9 % IV SOLN
1.0000 g | Freq: Once | INTRAVENOUS | Status: AC
  Administered 2019-04-27: 1 g via INTRAVENOUS
  Filled 2019-04-27: qty 10

## 2019-04-27 MED ORDER — CEPHALEXIN 250 MG PO CAPS
250.0000 mg | ORAL_CAPSULE | Freq: Three times a day (TID) | ORAL | Status: DC
  Administered 2019-04-28 – 2019-04-29 (×5): 250 mg via ORAL
  Filled 2019-04-27 (×10): qty 1

## 2019-04-27 MED ORDER — CEPHALEXIN 250 MG PO CAPS
250.0000 mg | ORAL_CAPSULE | Freq: Three times a day (TID) | ORAL | Status: DC
  Administered 2019-04-27: 250 mg via ORAL
  Filled 2019-04-27 (×5): qty 1

## 2019-04-27 NOTE — ED Notes (Signed)
Meal provided 

## 2019-04-27 NOTE — ED Notes (Signed)
Attempted to contact pt's son [name]) with update. No answer and mailbox full. Will try later.

## 2019-04-27 NOTE — ED Notes (Signed)
Meal provided   Hand fed per son's request   Pt ate 70 per cent of meal   Currently resting eyes closed

## 2019-04-27 NOTE — ED Provider Notes (Addendum)
85 year old female boarding here for placement.  She had a urinalysis done on the 26 and her culture came out positive for Klebsiella.  It is sensitive to cephalosporins and she has no drug allergies.  I have ordered her Keflex 3 times daily.   Hayden Rasmussen, MD 04/27/19 [phone]  Addendum: Was informed the patient is now febrile at around 1 PM on 6/28.  She was culture positive for Klebsiella in the urine.  Have ordered her some Tylenol and will give her an IV dose of ceftriaxone.   Hayden Rasmussen, MD 04/27/19 [phone]

## 2019-04-27 NOTE — ED Notes (Signed)
Pt has been checked on several times with offers of water   Lights dimmed for her comfort

## 2019-04-27 NOTE — ED Notes (Signed)
Resting quietly , eyes closed with even unlabored respirations

## 2019-04-27 NOTE — ED Notes (Signed)
Meal provided   Pt being fed by staff as requested by son

## 2019-04-27 NOTE — ED Notes (Signed)
Awaiting SW placement

## 2019-04-27 NOTE — ED Notes (Signed)
MD made aware of temp. See new orders.

## 2019-04-28 ENCOUNTER — Emergency Department (HOSPITAL_COMMUNITY): Payer: Medicare Other

## 2019-04-28 DIAGNOSIS — M25531 Pain in right wrist: Secondary | ICD-10-CM | POA: Diagnosis not present

## 2019-04-28 DIAGNOSIS — M7989 Other specified soft tissue disorders: Secondary | ICD-10-CM | POA: Diagnosis not present

## 2019-04-28 DIAGNOSIS — R627 Adult failure to thrive: Secondary | ICD-10-CM | POA: Diagnosis not present

## 2019-04-28 DIAGNOSIS — M545 Low back pain: Secondary | ICD-10-CM | POA: Diagnosis not present

## 2019-04-28 LAB — CBC WITH DIFFERENTIAL/PLATELET
Abs Immature Granulocytes: 0.15 10*3/uL — ABNORMAL HIGH (ref 0.00–0.07)
Basophils Absolute: 0 10*3/uL (ref 0.0–0.1)
Basophils Relative: 0 %
Eosinophils Absolute: 0 10*3/uL (ref 0.0–0.5)
Eosinophils Relative: 0 %
HCT: 26.6 % — ABNORMAL LOW (ref 36.0–46.0)
Hemoglobin: 8.5 g/dL — ABNORMAL LOW (ref 12.0–15.0)
Immature Granulocytes: 1 %
Lymphocytes Relative: 6 %
Lymphs Abs: 0.8 10*3/uL (ref 0.7–4.0)
MCH: 28.4 pg (ref 26.0–34.0)
MCHC: 32 g/dL (ref 30.0–36.0)
MCV: 89 fL (ref 80.0–100.0)
Monocytes Absolute: 1.1 10*3/uL — ABNORMAL HIGH (ref 0.1–1.0)
Monocytes Relative: 8 %
Neutro Abs: 11.7 10*3/uL — ABNORMAL HIGH (ref 1.7–7.7)
Neutrophils Relative %: 85 %
Platelets: 378 10*3/uL (ref 150–400)
RBC: 2.99 MIL/uL — ABNORMAL LOW (ref 3.87–5.11)
RDW: 16.3 % — ABNORMAL HIGH (ref 11.5–15.5)
WBC: 13.8 10*3/uL — ABNORMAL HIGH (ref 4.0–10.5)
nRBC: 0 % (ref 0.0–0.2)

## 2019-04-28 LAB — BASIC METABOLIC PANEL
Anion gap: 12 (ref 5–15)
BUN: 36 mg/dL — ABNORMAL HIGH (ref 8–23)
CO2: 21 mmol/L — ABNORMAL LOW (ref 22–32)
Calcium: 8.9 mg/dL (ref 8.9–10.3)
Chloride: 101 mmol/L (ref 98–111)
Creatinine, Ser: 1.21 mg/dL — ABNORMAL HIGH (ref 0.44–1.00)
GFR calc Af Amer: 47 mL/min — ABNORMAL LOW (ref >60)
GFR calc non Af Amer: 41 mL/min — ABNORMAL LOW (ref >60)
Glucose, Bld: 141 mg/dL — ABNORMAL HIGH (ref 70–99)
Potassium: 4.6 mmol/L (ref 3.5–5.1)
Sodium: 134 mmol/L — ABNORMAL LOW (ref 135–145)

## 2019-04-28 LAB — LACTIC ACID, PLASMA: Lactic Acid, Venous: 1 mmol/L (ref 0.5–1.9)

## 2019-04-28 MED ORDER — GADOBUTROL 1 MMOL/ML IV SOLN
7.0000 mL | Freq: Once | INTRAVENOUS | Status: AC | PRN
  Administered 2019-04-28: 7 mL via INTRAVENOUS

## 2019-04-28 MED ORDER — SODIUM CHLORIDE 0.9 % IV SOLN
1.0000 g | Freq: Once | INTRAVENOUS | Status: AC
  Administered 2019-04-28: 1 g via INTRAVENOUS
  Filled 2019-04-28: qty 10

## 2019-04-28 MED ORDER — IOHEXOL 300 MG/ML  SOLN
75.0000 mL | Freq: Once | INTRAMUSCULAR | Status: AC | PRN
  Administered 2019-04-28: 75 mL via INTRAVENOUS

## 2019-04-28 MED ORDER — SODIUM CHLORIDE 0.9 % IV BOLUS
500.0000 mL | Freq: Once | INTRAVENOUS | Status: AC
  Administered 2019-04-28: 500 mL via INTRAVENOUS

## 2019-04-28 MED ORDER — SODIUM CHLORIDE 0.9 % IV SOLN: 1.0000 g | Freq: Once | INTRAVENOUS | Status: DC

## 2019-04-28 NOTE — ED Notes (Signed)
Patient repositioned in bed.

## 2019-04-28 NOTE — ED Notes (Signed)
Patient transported to CT 

## 2019-04-28 NOTE — ED Notes (Signed)
Pt's right arm elevated with pillow.

## 2019-04-28 NOTE — ED Notes (Signed)
Pt was given lunch tray. Pt ate about 1/4 of her food.

## 2019-04-28 NOTE — ED Notes (Signed)
Pt right arm propped on pillow. Pt readjusted in bed. Pt offered food, pt declined at this time.

## 2019-04-28 NOTE — ED Notes (Signed)
Patient transported to MRI 

## 2019-04-28 NOTE — ED Provider Notes (Signed)
3:51 PM.  Patient is holding the ER for possible placement due to back pain.  She has been determined to have a urinary tract infection which has been treated aggressively.  She has had intermittent fever over the last couple of days.  She underwent MRI to evaluate back pain and was recommended to have a CT scan and follow-up to look for signs of discitis.  This has been performed, and does not indicate any acute spinal infection by report of radiology.  I have reviewed the films.  Urinalysis was sent, 04/25/2019, it is marginally abnormal.  Urine culture was done, which showed greater than 100,000 Klebsiella pneumoniae colonies.  This bacteria is sensitive to cephalosporins.  Patient has been treated with Rocephin and Keflex.  Vital signs this afternoon have been reassuring.  Patient is on a regular diet, and is awaiting placement by social services.  It is anticipated that this may take some time, but no definite plan has been made.  At this time there is no indication for changing this placement plan.   Daleen Bo, MD 04/28/19 [phone]

## 2019-04-28 NOTE — ED Notes (Addendum)
Received report on pt, pt lying sem fowlers in bed,pt mumbles when spoken to, right arm swollen, warm to touch, pt will grimace with movement of right arm, pt repositioned  for comfort, pt updated on plan of care,

## 2019-04-28 NOTE — ED Provider Notes (Signed)
9:10 AM On chart review patient is noted to have a fever this morning of 101.  UTI diagnosed a few days ago and has been on Keflex but spiked a fever yesterday.  Would be sensitive to ceftriaxone/cephalosporins.  She received IV Rocephin yesterday.  She is comfortable appearing and besides the temperature her vitals are okay besides some hypertension.  She does have significant pain and warmth in her right wrist and the warmth goes up a little bit her arm but she does not have tenderness except for the wrist.  Previous history of gout but I think with fever, will get CT to rule out deeper space infection.  We will also obtain labs.  2:00 PM Ct wrist without acute fluid/edema to suggest septic arthritis.  With her history of gout this is probably gout.  However she still has this fever that does not seem to be responding to the Rocephin.  Given the back pain as well, MRI was obtained and shows probable degenerative disc disease but cannot rule out osteomyelitis.  I discussed with Dr. Posey Pronto of radiology and he recommends CT lumbar spine without contrast to compare to her old one and this will help some better assess for discitis/osteomyelitis.  CT is currently pending.  Labs are overall benign besides mild WBC elevation but normal lactate.  While she did have a fever this morning, the original fever was still less than 24 hours ago.  I think if she continues to improve and CT is unremarkable she does not need admission. Care to Dr. Eulis Foster.   Sherwood Gambler, MD 04/28/19 [phone]

## 2019-04-28 NOTE — ED Notes (Signed)
Pt given meal tray, Pt does not want it at this time.

## 2019-04-29 ENCOUNTER — Inpatient Hospital Stay
Admission: RE | Admit: 2019-04-29 | Discharge: 2019-05-27 | Disposition: A | Discharge status: Home / Self Care | Payer: Medicare Other | Source: Ambulatory Visit | Attending: Internal Medicine | Admitting: Internal Medicine

## 2019-04-29 DIAGNOSIS — I130 Hypertensive heart and chronic kidney disease with heart failure and stage 1 through stage 4 chronic kidney disease, or unspecified chronic kidney disease: Secondary | ICD-10-CM | POA: Diagnosis not present

## 2019-04-29 DIAGNOSIS — I517 Cardiomegaly: Secondary | ICD-10-CM | POA: Diagnosis not present

## 2019-04-29 DIAGNOSIS — I440 Atrioventricular block, first degree: Secondary | ICD-10-CM | POA: Diagnosis not present

## 2019-04-29 DIAGNOSIS — M10331 Gout due to renal impairment, right wrist: Secondary | ICD-10-CM | POA: Diagnosis not present

## 2019-04-29 DIAGNOSIS — R279 Unspecified lack of coordination: Secondary | ICD-10-CM | POA: Diagnosis not present

## 2019-04-29 DIAGNOSIS — M2550 Pain in unspecified joint: Secondary | ICD-10-CM | POA: Diagnosis not present

## 2019-04-29 DIAGNOSIS — I5032 Chronic diastolic (congestive) heart failure: Secondary | ICD-10-CM | POA: Diagnosis not present

## 2019-04-29 DIAGNOSIS — R531 Weakness: Secondary | ICD-10-CM | POA: Diagnosis not present

## 2019-04-29 DIAGNOSIS — I248 Other forms of acute ischemic heart disease: Secondary | ICD-10-CM | POA: Diagnosis not present

## 2019-04-29 DIAGNOSIS — R1311 Dysphagia, oral phase: Secondary | ICD-10-CM | POA: Diagnosis not present

## 2019-04-29 DIAGNOSIS — N184 Chronic kidney disease, stage 4 (severe): Secondary | ICD-10-CM | POA: Diagnosis not present

## 2019-04-29 DIAGNOSIS — B961 Klebsiella pneumoniae [K. pneumoniae] as the cause of diseases classified elsewhere: Secondary | ICD-10-CM | POA: Diagnosis not present

## 2019-04-29 DIAGNOSIS — I10 Essential (primary) hypertension: Secondary | ICD-10-CM | POA: Diagnosis not present

## 2019-04-29 DIAGNOSIS — F329 Major depressive disorder, single episode, unspecified: Secondary | ICD-10-CM | POA: Diagnosis not present

## 2019-04-29 DIAGNOSIS — E1122 Type 2 diabetes mellitus with diabetic chronic kidney disease: Secondary | ICD-10-CM | POA: Diagnosis not present

## 2019-04-29 DIAGNOSIS — M10031 Idiopathic gout, right wrist: Secondary | ICD-10-CM | POA: Diagnosis not present

## 2019-04-29 DIAGNOSIS — R262 Difficulty in walking, not elsewhere classified: Secondary | ICD-10-CM | POA: Diagnosis not present

## 2019-04-29 DIAGNOSIS — N390 Urinary tract infection, site not specified: Secondary | ICD-10-CM | POA: Diagnosis not present

## 2019-04-29 DIAGNOSIS — M545 Low back pain: Secondary | ICD-10-CM | POA: Diagnosis not present

## 2019-04-29 DIAGNOSIS — E43 Unspecified severe protein-calorie malnutrition: Secondary | ICD-10-CM | POA: Diagnosis not present

## 2019-04-29 DIAGNOSIS — R627 Adult failure to thrive: Secondary | ICD-10-CM | POA: Diagnosis not present

## 2019-04-29 DIAGNOSIS — N183 Chronic kidney disease, stage 3 (moderate): Secondary | ICD-10-CM | POA: Diagnosis not present

## 2019-04-29 DIAGNOSIS — Z03818 Encounter for observation for suspected exposure to other biological agents ruled out: Secondary | ICD-10-CM | POA: Diagnosis not present

## 2019-04-29 DIAGNOSIS — B370 Candidal stomatitis: Secondary | ICD-10-CM | POA: Diagnosis not present

## 2019-04-29 DIAGNOSIS — Z741 Need for assistance with personal care: Secondary | ICD-10-CM | POA: Diagnosis not present

## 2019-04-29 DIAGNOSIS — Z79899 Other long term (current) drug therapy: Secondary | ICD-10-CM | POA: Diagnosis not present

## 2019-04-29 DIAGNOSIS — Z7901 Long term (current) use of anticoagulants: Secondary | ICD-10-CM | POA: Diagnosis not present

## 2019-04-29 DIAGNOSIS — D631 Anemia in chronic kidney disease: Secondary | ICD-10-CM | POA: Diagnosis not present

## 2019-04-29 DIAGNOSIS — I482 Chronic atrial fibrillation, unspecified: Secondary | ICD-10-CM | POA: Diagnosis not present

## 2019-04-29 DIAGNOSIS — F322 Major depressive disorder, single episode, severe without psychotic features: Secondary | ICD-10-CM | POA: Diagnosis not present

## 2019-04-29 DIAGNOSIS — M6281 Muscle weakness (generalized): Secondary | ICD-10-CM | POA: Diagnosis not present

## 2019-04-29 DIAGNOSIS — E119 Type 2 diabetes mellitus without complications: Secondary | ICD-10-CM | POA: Diagnosis not present

## 2019-04-29 DIAGNOSIS — R001 Bradycardia, unspecified: Secondary | ICD-10-CM | POA: Diagnosis not present

## 2019-04-29 DIAGNOSIS — L89150 Pressure ulcer of sacral region, unstageable: Secondary | ICD-10-CM | POA: Diagnosis not present

## 2019-04-29 DIAGNOSIS — I700 Atherosclerosis of aorta: Secondary | ICD-10-CM | POA: Diagnosis not present

## 2019-04-29 LAB — SARS CORONAVIRUS 2 BY RT PCR (HOSPITAL ORDER, PERFORMED IN ~~LOC~~ HOSPITAL LAB): SARS Coronavirus 2: NEGATIVE

## 2019-04-29 MED ORDER — CEPHALEXIN 500 MG PO CAPS: 500.0000 mg | ORAL_CAPSULE | Freq: Three times a day (TID) | ORAL | 0 refills | Status: AC

## 2019-04-29 NOTE — ED Provider Notes (Addendum)
Blood pressure (!) 166/82, pulse 93, temperature 97.8 F (36.6 C), temperature source Oral, resp. rate 16, SpO2 98 %.   In short, Courtney Bradford is a 85 y.o. female with a chief complaint of Back Pain .  Refer to the original H&P for additional details.  Patient has been accepted at the [facility].  She is stable for discharge. Wrote Rx for keflex.       Margette Fast, MD 04/29/19 1604    Margette Fast, MD 04/29/19 [phone]

## 2019-04-29 NOTE — ED Notes (Signed)
Pt awake, denies any complaints, update given,

## 2019-04-29 NOTE — Discharge Instructions (Signed)
Follow-up with your primary care provider.  Please complete the course of antibiotics for urinary tract infection.  Return to the emergency department with any new or suddenly worsening symptoms.

## 2019-04-29 NOTE — ED Notes (Signed)
Pharmacy made aware of missing meds

## 2019-04-29 NOTE — ED Notes (Signed)
Pt's pure wick, tubing and canister changed, pt repositioned, turned to left side, pillow placed under right arm,

## 2019-04-29 NOTE — Clinical Social Work Note (Signed)
According to [facility] at [facility], pt has been approved by insurance for Skilled rehab placement. [facility] needs two things to come get patient: 1) Final Dr report, and 2) nursing to call report to [facility].  Spoke to nurse Velna Hatchet about this, and called son to let him know of impending transfer.

## 2019-04-30 ENCOUNTER — Other Ambulatory Visit: Payer: Self-pay | Admitting: Adult Health

## 2019-04-30 ENCOUNTER — Encounter: Payer: Self-pay | Admitting: Adult Health

## 2019-04-30 ENCOUNTER — Non-Acute Institutional Stay (SKILLED_NURSING_FACILITY): Payer: Medicare Other | Admitting: Adult Health

## 2019-04-30 DIAGNOSIS — I130 Hypertensive heart and chronic kidney disease with heart failure and stage 1 through stage 4 chronic kidney disease, or unspecified chronic kidney disease: Secondary | ICD-10-CM

## 2019-04-30 DIAGNOSIS — I5032 Chronic diastolic (congestive) heart failure: Secondary | ICD-10-CM | POA: Diagnosis not present

## 2019-04-30 DIAGNOSIS — N183 Chronic kidney disease, stage 3 unspecified: Secondary | ICD-10-CM

## 2019-04-30 DIAGNOSIS — E43 Unspecified severe protein-calorie malnutrition: Secondary | ICD-10-CM | POA: Diagnosis not present

## 2019-04-30 DIAGNOSIS — N184 Chronic kidney disease, stage 4 (severe): Secondary | ICD-10-CM

## 2019-04-30 DIAGNOSIS — G8929 Other chronic pain: Secondary | ICD-10-CM

## 2019-04-30 DIAGNOSIS — M545 Low back pain: Secondary | ICD-10-CM

## 2019-04-30 DIAGNOSIS — I482 Chronic atrial fibrillation, unspecified: Secondary | ICD-10-CM

## 2019-04-30 DIAGNOSIS — D631 Anemia in chronic kidney disease: Secondary | ICD-10-CM

## 2019-04-30 DIAGNOSIS — M1A0311 Idiopathic chronic gout, right wrist, with tophus (tophi): Secondary | ICD-10-CM

## 2019-04-30 DIAGNOSIS — E1122 Type 2 diabetes mellitus with diabetic chronic kidney disease: Secondary | ICD-10-CM

## 2019-04-30 MED ORDER — TRAMADOL HCL 50 MG PO TABS: 50.0000 mg | ORAL_TABLET | Freq: Two times a day (BID) | ORAL | 0 refills | Status: AC

## 2019-04-30 NOTE — Progress Notes (Addendum)
Location:    [place] Room Number: 157/D Place of Service:  SNF (31)   CODE STATUS: Full Code  Allergies  Allergen Reactions  . Lactose Intolerance (Gi) Other (See Comments)    Upset stomach    Chief Complaint  Patient presents with  . Hospitalization Follow-up    Hospitalization Follow Up Visit    HPI:  She is a 85 year old woman; who had recently been discharged from this facility to home. She has failed that transition. She is presently being treated for an uti. She does have significant right wrist pain with swelling present. She denies any changes in her appetite; she denies any cough or any shortness of breath. More than likely this does represent a long term placement for her. She will continue to be followed for her chronic illnesses including: chf; afib; anemia.   Past Medical History:  Diagnosis Date  . A-fib ([place])   . Anemia   . Arthritis    right knee  . Cataract   . CHF (congestive heart failure) ([place])   . CKD (chronic kidney disease), stage III ([place])   . Dehydration   . Diabetes mellitus   . Gout   . Headache(784.0)   . Hypertension   . UTI (lower urinary tract infection)     Past Surgical History:  Procedure Laterality Date  . ABDOMINAL HYSTERECTOMY    . AMPUTATION FINGER Left 01/22/2019   Procedure: Amputation Left Index Finger First Joint;  Surgeon: Iran Planas, MD;  Location: [facility];  Service: Orthopedics;  Laterality: Left;  . ANTERIOR AND POSTERIOR REPAIR  05/17/2011   Procedure: ANTERIOR (CYSTOCELE) AND POSTERIOR REPAIR (RECTOCELE);  Surgeon: Eli Hose, MD;  Location: [facility];  Service: Gynecology;  Laterality: N/A;  Anterior repair with TVT bladder sling and cystoscopy  . BLADDER SUSPENSION  05/17/2011   Procedure: TRANSVAGINAL TAPE (TVT) PROCEDURE;  Surgeon: Eli Hose, MD;  Location: [facility];  Service: Gynecology;  Laterality: N/A;  . BREAST SURGERY     breast biopsy  . I&D EXTREMITY Left 01/22/2019   Procedure: IRRIGATION AND DEBRIDEMENT OF LEFT INDEX FINGER,;  Surgeon: Iran Planas, MD;  Location: [facility];  Service: Orthopedics;  Laterality: Left;  . IR FLUORO GUIDED NEEDLE PLC ASPIRATION/INJECTION LOC  04/05/2019    Social History   Socioeconomic History  . Marital status: Single    Spouse name: Not on file  . Number of children: Not on file  . Years of education: Not on file  . Highest education level: Not on file  Occupational History  . Not on file  Social Needs  . Financial resource strain: Not on file  . Food insecurity    Worry: Not on file    Inability: Not on file  . Transportation needs    Medical: Not on file    Non-medical: Not on file  Tobacco Use  . Smoking status: Never Smoker  . Smokeless tobacco: Never Used  Substance and Sexual Activity  . Alcohol use: No  . Drug use: No  . Sexual activity: Never    Birth control/protection: Abstinence  Lifestyle  . Physical activity    Days per week: Not on file    Minutes per session: Not on file  . Stress: Not on file  Relationships  . Social Herbalist on phone: Not on file    Gets together: Not on file    Attends religious service: Not on file  Active member of club or organization: Not on file    Attends meetings of clubs or organizations: Not on file    Relationship status: Not on file  . Intimate partner violence    Fear of current or ex partner: Not on file    Emotionally abused: Not on file    Physically abused: Not on file    Forced sexual activity: Not on file  Other Topics Concern  . Not on file  Social History Narrative  . Not on file   History reviewed. No pertinent family history.    VITAL SIGNS BP 140/70   Pulse 100   Temp 98.4 F (36.9 C) (Oral)   Resp 20   Wt 155 lb 12.8 oz (70.7 kg)   SpO2 99%   BMI 25.15 kg/m   Outpatient Encounter Medications as of 04/30/2019  Medication Sig Note  . acetaminophen (TYLENOL) 500 MG tablet Take 500 mg by mouth every 6 (six) hours as  needed for mild pain.    Marland Kitchen allopurinol (ZYLOPRIM) 100 MG tablet Take 0.5 tablets (50 mg total) by mouth daily.   Marland Kitchen amLODipine (NORVASC) 2.5 MG tablet Take 1 tablet (2.5 mg total) by mouth daily.   Marland Kitchen apixaban (ELIQUIS) 2.5 MG TABS tablet Take 1 tablet (2.5 mg total) by mouth 2 (two) times daily.   . cephALEXin (KEFLEX) 500 MG capsule Take 1 capsule (500 mg total) by mouth 3 (three) times daily for 10 days.   . colchicine 0.6 MG tablet Take 0.5 tablets (0.3 mg total) by mouth daily.   . feeding supplement, GLUCERNA SHAKE, (GLUCERNA SHAKE) LIQD Take 237 mLs by mouth 3 (three) times daily between meals.   . ferrous fumarate (HEMOCYTE - 106 MG FE) 325 (106 Fe) MG TABS tablet Take 1 tablet (106 mg of iron total) by mouth every morning.   . furosemide (LASIX) 20 MG tablet Take 20 mg by mouth every other day.    . NON FORMULARY Diet ___x__ Regular,NAS,Consistent Carbohydrate   . olopatadine (PATANOL) 0.1 % ophthalmic solution Place 1 drop into both eyes 2 (two) times daily.   . predniSONE (DELTASONE) 5 MG tablet Take 0.5 tablets (2.5 mg total) by mouth daily with breakfast.    No facility-administered encounter medications on file as of 04/30/2019.      SIGNIFICANT DIAGNOSTIC EXAMS   PREVIOUS:   03-25-19: chest x-ray: no active disease  03-25-19: ct of abdomen and pelvis:  1. Extensive colonic diverticulosis without evidence to suggest an acute diverticulitis at this time. 2. Mild left hydroureteronephrosis terminating in the proximal third of the left ureter. No obstructing stone identified. The possibility of a ureteral stricture could be considered. 3. Small amount of gas non dependently in the urinary bladder. This may be iatrogenic if there has been recent catheterization. In the absence of catheterization, correlation with urinalysis is suggested to exclude the possibility of urinary tract infection with gas-forming organisms. 4. Aortic atherosclerosis. 5. Mild cardiomegaly. 6. Trace amount  of biliary sludge and/or tiny gallstones lying dependently in the gallbladder. No findings to suggest an acute cholecystitis at this time.  03-25-19: ct of head: chronic atrophy and ischemic changes without acute abnormality.   04-02-19: right wrist x-ray: chronic changes without acute abnormality   TODAY:   04-25-19: ct of head; No acute intracranial abnormality identified. Stable CT appearance of advanced chronic small vessel disease.  04-25-19: lumbar spine x-ray:  1. Scoliosis and associated disc osteophytic disease. 2. No acute findings in the lumbar spine  identified.  04-28-19: ct of right wrist:  1. Severe osteopenia. Degree of osteopenia limits bone detail and evaluation for subtle traumatic injury. No acute osseous injury of the right wrist. Given the patient's severe osteopenia, if there is persistent clinical concern for an occult fracture, a MRI of the wrist is recommended for increased sensitivity.  04-28-19: ct of lumbar spine:  Moderate lumbar scoliosis. Multilevel degenerative change causing spinal and foraminal stenosis at multiple levels due to spurring as Above  04-28-19: MRI of lumbar spine:  1. Fluid signal within the L2-3, L3-4 and L5-S1 disc space is with mild endplate edema at A1-P3. This corresponds to areas of vacuum disc phenomenon seen on recent CT abdomen/pelvis dated 03/25/2019. Overall findings are more suggestive of degenerative disc disease and less likely discitis-osteomyelitis. It would be helpful to have a more recent CT of the lumbar spine to compare with the CT abdomen/pelvis dated 03/24/2021 evaluate for any interval changes. 2. Severe bilateral facet arthropathy at L5-S1 with perifacet inflammatory changes. 3. Diffuse lumbar spine spondylosis as described above.   LABS REVIEWED PREVIOUS:   03-25-19: wbc 7.9; hgb 9.2; hct 29.5; mcv 90.8 plt 174; glucose 82; bun 45; creat 1.57; k+ 3.7; na++ 144; ca 9.4; liver normal albumin 3.6 BNP 736.3 blood culture:  e-coli/enterobacteriaceae species urine culture: e-coli klebsiella pneumoniae  03-28-19" wbc 7.4; hgb 8.4; ht 26.5; mcv 90.8; plt 171 glucose 79; bun 36; creat 1.37; k+ 4.6; na++ 141; ca 9.5  03-30-19: wbc 8.9; hgb 8.9; hct 28.2; mcv 87.9 ;plt 208 glucose 116; bun 24; creat 1.26; k+ 4.2; na++ 135; ca 9.3 phos 2.0; albumin 3.1 mag 1.5  04-01-19: wbc 13.3; hgb 8.7; hct 27.2; mcv 87.2; plt 269; glucose 117; bun 24; creat 1.42; k+ 4.3; na++ 135; ca 9.3 phos 2.2; albumin 2.6; mag 1.6 04-03-19: wbc 8.3; hgb 8.8; hct 28.3; mcv 89.0; plt 414; glucose 105; bun 39; creat 1.41; k+ 4.8; na++ 133; ca 9.4 liver normal albumin 2.7 04-04-19: mag 1.9   04-06-19: glucose 97; bun 47; creat 1.59; k+ 4.7; na++ 137; ca 9.2 04-07-19: glucose 96; bun 47; creat 1.58; k+ 4.7 na++ 137; ca 9.0  04-11-19: wbc 8.1; hgb 7.6; hct 25.8; mcv 93.5; plt 658; glucose 76; bun 69; creat 1.56; k+ 5.5; na++ 137; ca 9.3; uric acid 8.5; hgb a1c 4.7  04-14-19: hgb 7.7; hct 26.2  glucose 55; bun 61; creat 1.66; k+ 4.6; na++ 140; ca 9.3;  04-16-19: hgb 8.6; hct 29.5; glucose 87; bun 69; creat 1.47; k+ 5.7; na++ 143; ca 8.9  04-17-19: glucose 46 bun 62; creat 1.70; k+ 4.6; na++ 143; ca 8.4   LABS REVIEWED TODAY:   04-22-19: glucose 158; bun 84; creat 1.62; k+ 4.9; na++ 136; ca 8.6 04-25-19: urine culture: klebsiella pneumoniae: keflex 04-28-19: wbc 13.8; hgb 8.5; hct 26.6; mcv 89.0; plt 387; glucose 141; bun 36; creat 1.21; k+ 4.6; na++ 134; ca 8.9; blood culture: no growth     Review of Systems  Constitutional: Negative for malaise/fatigue.  Respiratory: Negative for cough.   Cardiovascular: Negative for chest pain.  Gastrointestinal: Negative for heartburn.  Musculoskeletal: Positive for back pain and joint pain. Negative for myalgias.       Back pain Right wrist pain   Skin: Negative.   Neurological: Negative for dizziness.  Psychiatric/Behavioral: The patient is not nervous/anxious.     Physical Exam Constitutional:      General: She is not  in acute distress.    Appearance: She is well-developed. She  is not diaphoretic.  Neck:     Musculoskeletal: Neck supple.     Thyroid: No thyromegaly.  Cardiovascular:     Rate and Rhythm: Normal rate and regular rhythm.     Pulses: Normal pulses.     Heart sounds: Murmur present.     Comments: 2/6 Pulmonary:     Effort: Pulmonary effort is normal. No respiratory distress.     Breath sounds: Normal breath sounds.  Abdominal:     General: Bowel sounds are normal. There is no distension.     Palpations: Abdomen is soft.     Tenderness: There is no abdominal tenderness.  Musculoskeletal:     Right lower leg: Edema present.     Left lower leg: Edema present.     Comments: Is able to move all extremities Status post left index finger amputation  Right wrist/hand increased swelling and pain Has trace bilateral lower extremity edema    Lymphadenopathy:     Cervical: No cervical adenopathy.  Skin:    General: Skin is warm and dry.     Comments: Bilateral lower extremities discolored   Neurological:     Mental Status: She is alert. Mental status is at baseline.  Psychiatric:        Mood and Affect: Mood normal.     ASSESSMENT/ PLAN:  TODAY:   1. Controlled type 2 diabetes mellitus with stage 4 chronic disease without long term current use of insulin: is stable her hgb a1c is 4.7; is off [facility] will monitor her status  2. Stage 4 chronic kidney disease due to type 2 diabetes mellitus/hydroureterophrosis is without change bun 36; create 1.21; will monitor   3. Acute gout due to renal impairment involving right wrist: is worse: will continue colchicine 0.3 mg daily allopurinol 50 mg daily will begin prednisone 10 mg daily through 05-06-19 then 5 mg daily will monitor  4. Anemia due to stage 4 chronic kidney disease: is without change hgb 8.5 will continue hemocyte daily   5. Protein calorie malnutrition severe: is stable albumin 2.7 will continue glucerna twice daily   6.  Hypomagnesemia: is stable mag 1.9 will monitor  7. chorinc diastolic CHF (Congestive heart failure) /demand ischemia: EF 50-55% (3-36-20) is stable will continue lasix 20 mg every other day   8. Atrial fibrillation, chronic: heart rate stable will continue eliquis 2.5 mg twice daily   9. Hypertensive heart and kidney disease with chronic diastolic congestive heart failure and stage 4 chronic kidney disease is stable b/p 140/70 will continue norvasc 2.5 mg daily   10. Chronic low back pain: is worse will begin ultram 50 mg twice daily will monitor   11. UTI: will complete abt and will monitor her status.    MD is aware of resident's narcotic use and is in agreement with current plan of care. We will attempt to wean resident as apropriate   Ok Edwards NP [facility]  Contact [phone] Monday through Friday 8am- 5pm  After hours call [phone]

## 2019-05-01 DIAGNOSIS — E1122 Type 2 diabetes mellitus with diabetic chronic kidney disease: Secondary | ICD-10-CM | POA: Insufficient documentation

## 2019-05-01 DIAGNOSIS — G8929 Other chronic pain: Secondary | ICD-10-CM | POA: Insufficient documentation

## 2019-05-03 LAB — CULTURE, BLOOD (ROUTINE X 2)
Culture: NO GROWTH
Culture: NO GROWTH
Special Requests: ADEQUATE
Special Requests: ADEQUATE

## 2019-05-07 ENCOUNTER — Encounter: Payer: Self-pay | Admitting: Adult Health

## 2019-05-07 ENCOUNTER — Non-Acute Institutional Stay (SKILLED_NURSING_FACILITY): Payer: Medicare Other | Admitting: Adult Health

## 2019-05-07 DIAGNOSIS — F32A Depression, unspecified: Secondary | ICD-10-CM

## 2019-05-07 DIAGNOSIS — M10331 Gout due to renal impairment, right wrist: Secondary | ICD-10-CM

## 2019-05-07 DIAGNOSIS — D631 Anemia in chronic kidney disease: Secondary | ICD-10-CM | POA: Diagnosis not present

## 2019-05-07 DIAGNOSIS — F329 Major depressive disorder, single episode, unspecified: Secondary | ICD-10-CM | POA: Diagnosis not present

## 2019-05-07 DIAGNOSIS — E43 Unspecified severe protein-calorie malnutrition: Secondary | ICD-10-CM

## 2019-05-07 DIAGNOSIS — N184 Chronic kidney disease, stage 4 (severe): Secondary | ICD-10-CM | POA: Diagnosis not present

## 2019-05-07 NOTE — Progress Notes (Signed)
Location:   [facility] Room Number: 621 D Place of Service:  SNF (31)   CODE STATUS: DNR  Allergies  Allergen Reactions  . Lactose Intolerance (Gi) Other (See Comments)    Upset stomach    Chief Complaint  Patient presents with  . Medical Management of Chronic Issues       Acute gout due to renal impairment involving right wrist: . Anemia due to stage 4 chronic kidney disease:   Protein calore malnutrition severe:   Weekly follow up for the first 30 days post hospitalization.     HPI:  She is a 85 year old long term resident of this facility being seen for the management of her chronic illnesses: gout; anemia; malnutrition. There are no reports of right wrist pain. She is pocketing food in her mouth; is presently being followed by speech therapy. She is more withdrawn; will not answer question. She is somewhat tearful.   Past Medical History:  Diagnosis Date  . A-fib ([place])   . Anemia   . Arthritis    right knee  . Cataract   . CHF (congestive heart failure) ([place])   . CKD (chronic kidney disease), stage III ([place])   . Dehydration   . Diabetes mellitus   . Gout   . Headache(784.0)   . Hypertension   . UTI (lower urinary tract infection)     Past Surgical History:  Procedure Laterality Date  . ABDOMINAL HYSTERECTOMY    . AMPUTATION FINGER Left 01/22/2019   Procedure: Amputation Left Index Finger First Joint;  Surgeon: Iran Planas, MD;  Location: [facility];  Service: Orthopedics;  Laterality: Left;  . ANTERIOR AND POSTERIOR REPAIR  05/17/2011   Procedure: ANTERIOR (CYSTOCELE) AND POSTERIOR REPAIR (RECTOCELE);  Surgeon: Eli Hose, MD;  Location: [facility];  Service: Gynecology;  Laterality: N/A;  Anterior repair with TVT bladder sling and cystoscopy  . BLADDER SUSPENSION  05/17/2011   Procedure: TRANSVAGINAL TAPE (TVT) PROCEDURE;  Surgeon: Eli Hose, MD;  Location: [facility];  Service: Gynecology;  Laterality: N/A;  . BREAST SURGERY     breast biopsy  . I&D EXTREMITY Left 01/22/2019   Procedure: IRRIGATION AND DEBRIDEMENT OF LEFT INDEX FINGER,;  Surgeon: Iran Planas, MD;  Location: [facility];  Service: Orthopedics;  Laterality: Left;  . IR FLUORO GUIDED NEEDLE PLC ASPIRATION/INJECTION LOC  04/05/2019    Social History   Socioeconomic History  . Marital status: Single    Spouse name: Not on file  . Number of children: Not on file  . Years of education: Not on file  . Highest education level: Not on file  Occupational History  . Not on file  Social Needs  . Financial resource strain: Not on file  . Food insecurity    Worry: Not on file    Inability: Not on file  . Transportation needs    Medical: Not on file    Non-medical: Not on file  Tobacco Use  . Smoking status: Never Smoker  . Smokeless tobacco: Never Used  Substance and Sexual Activity  . Alcohol use: No  . Drug use: No  . Sexual activity: Never    Birth control/protection: Abstinence  Lifestyle  . Physical activity    Days per week: Not on file    Minutes per session: Not on file  . Stress: Not on file  Relationships  . Social Herbalist on phone: Not on file    Gets  together: Not on file    Attends religious service: Not on file    Active member of club or organization: Not on file    Attends meetings of clubs or organizations: Not on file    Relationship status: Not on file  . Intimate partner violence    Fear of current or ex partner: Not on file    Emotionally abused: Not on file    Physically abused: Not on file    Forced sexual activity: Not on file  Other Topics Concern  . Not on file  Social History Narrative  . Not on file   History reviewed. No pertinent family history.    VITAL SIGNS BP 137/87   Pulse 89   Temp 97.6 F (36.4 C)   Resp 20   Ht 5\' 6"  (1.676 m)   Wt 148 lb 9.6 oz (67.4 kg)   BMI 23.98 kg/m   Outpatient Encounter Medications as of 05/07/2019  Medication Sig  . acetaminophen (TYLENOL) 500 MG  tablet Take 500 mg by mouth every 6 (six) hours as needed for mild pain.   Marland Kitchen allopurinol (ZYLOPRIM) 100 MG tablet Take 0.5 tablets (50 mg total) by mouth daily.  Marland Kitchen amLODipine (NORVASC) 2.5 MG tablet Take 1 tablet (2.5 mg total) by mouth daily.  Marland Kitchen apixaban (ELIQUIS) 2.5 MG TABS tablet Take 1 tablet (2.5 mg total) by mouth 2 (two) times daily.  . cephALEXin (KEFLEX) 500 MG capsule Take 1 capsule (500 mg total) by mouth 3 (three) times daily for 10 days.  . colchicine 0.6 MG tablet Take 0.5 tablets (0.3 mg total) by mouth daily.  . feeding supplement, GLUCERNA SHAKE, (GLUCERNA SHAKE) LIQD Take 237 mLs by mouth 3 (three) times daily between meals.  . ferrous fumarate (HEMOCYTE - 106 MG FE) 325 (106 Fe) MG TABS tablet Take 1 tablet (106 mg of iron total) by mouth every morning.  . furosemide (LASIX) 20 MG tablet Take 20 mg by mouth every other day.   . NON FORMULARY Diet Type:  Dysphagia 2  . olopatadine (PATANOL) 0.1 % ophthalmic solution Place 1 drop into both eyes 2 (two) times daily.  . predniSONE (DELTASONE) 5 MG tablet Take 5 mg by mouth daily with breakfast.  . traMADol (ULTRAM) 50 MG tablet Take 1 tablet (50 mg total) by mouth 2 (two) times daily.  . [DISCONTINUED] predniSONE (DELTASONE) 5 MG tablet Take 0.5 tablets (2.5 mg total) by mouth daily with breakfast. (Patient not taking: Reported on 05/07/2019)   No facility-administered encounter medications on file as of 05/07/2019.      SIGNIFICANT DIAGNOSTIC EXAMS  PREVIOUS:   03-25-19: chest x-ray: no active disease  03-25-19: ct of abdomen and pelvis:  1. Extensive colonic diverticulosis without evidence to suggest an acute diverticulitis at this time. 2. Mild left hydroureteronephrosis terminating in the proximal third of the left ureter. No obstructing stone identified. The possibility of a ureteral stricture could be considered. 3. Small amount of gas non dependently in the urinary bladder. This may be iatrogenic if there has been recent  catheterization. In the absence of catheterization, correlation with urinalysis is suggested to exclude the possibility of urinary tract infection with gas-forming organisms. 4. Aortic atherosclerosis. 5. Mild cardiomegaly. 6. Trace amount of biliary sludge and/or tiny gallstones lying dependently in the gallbladder. No findings to suggest an acute cholecystitis at this time.  03-25-19: ct of head: chronic atrophy and ischemic changes without acute abnormality.   04-02-19: right wrist x-ray: chronic changes without  acute abnormality   04-25-19: ct of head; No acute intracranial abnormality identified. Stable CT appearance of advanced chronic small vessel disease.  04-25-19: lumbar spine x-ray:  1. Scoliosis and associated disc osteophytic disease. 2. No acute findings in the lumbar spine identified.  04-28-19: ct of right wrist:  1. Severe osteopenia. Degree of osteopenia limits bone detail and evaluation for subtle traumatic injury. No acute osseous injury of the right wrist. Given the patient's severe osteopenia, if there is persistent clinical concern for an occult fracture, a MRI of the wrist is recommended for increased sensitivity.  04-28-19: ct of lumbar spine:  Moderate lumbar scoliosis. Multilevel degenerative change causing spinal and foraminal stenosis at multiple levels due to spurring as Above  04-28-19: MRI of lumbar spine:  1. Fluid signal within the L2-3, L3-4 and L5-S1 disc space is with mild endplate edema at B3-Z3. This corresponds to areas of vacuum disc phenomenon seen on recent CT abdomen/pelvis dated 03/25/2019. Overall findings are more suggestive of degenerative disc disease and less likely discitis-osteomyelitis. It would be helpful to have a more recent CT of the lumbar spine to compare with the CT abdomen/pelvis dated 03/24/2021 evaluate for any interval changes. 2. Severe bilateral facet arthropathy at L5-S1 with perifacet inflammatory changes. 3. Diffuse lumbar spine  spondylosis as described above.  NO NEW EXAMS.    LABS REVIEWED PREVIOUS:   03-25-19: wbc 7.9; hgb 9.2; hct 29.5; mcv 90.8 plt 174; glucose 82; bun 45; creat 1.57; k+ 3.7; na++ 144; ca 9.4; liver normal albumin 3.6 BNP 736.3 blood culture: e-coli/enterobacteriaceae species urine culture: e-coli klebsiella pneumoniae  03-28-19" wbc 7.4; hgb 8.4; ht 26.5; mcv 90.8; plt 171 glucose 79; bun 36; creat 1.37; k+ 4.6; na++ 141; ca 9.5  03-30-19: wbc 8.9; hgb 8.9; hct 28.2; mcv 87.9 ;plt 208 glucose 116; bun 24; creat 1.26; k+ 4.2; na++ 135; ca 9.3 phos 2.0; albumin 3.1 mag 1.5  04-01-19: wbc 13.3; hgb 8.7; hct 27.2; mcv 87.2; plt 269; glucose 117; bun 24; creat 1.42; k+ 4.3; na++ 135; ca 9.3 phos 2.2; albumin 2.6; mag 1.6 04-03-19: wbc 8.3; hgb 8.8; hct 28.3; mcv 89.0; plt 414; glucose 105; bun 39; creat 1.41; k+ 4.8; na++ 133; ca 9.4 liver normal albumin 2.7 04-04-19: mag 1.9   04-06-19: glucose 97; bun 47; creat 1.59; k+ 4.7; na++ 137; ca 9.2 04-07-19: glucose 96; bun 47; creat 1.58; k+ 4.7 na++ 137; ca 9.0  04-11-19: wbc 8.1; hgb 7.6; hct 25.8; mcv 93.5; plt 658; glucose 76; bun 69; creat 1.56; k+ 5.5; na++ 137; ca 9.3; uric acid 8.5; hgb a1c 4.7  04-14-19: hgb 7.7; hct 26.2  glucose 55; bun 61; creat 1.66; k+ 4.6; na++ 140; ca 9.3;  04-16-19: hgb 8.6; hct 29.5; glucose 87; bun 69; creat 1.47; k+ 5.7; na++ 143; ca 8.9  04-17-19: glucose 46 bun 62; creat 1.70; k+ 4.6; na++ 143; ca 8.4  04-22-19: glucose 158; bun 84; creat 1.62; k+ 4.9; na++ 136; ca 8.6 04-25-19: urine culture: klebsiella pneumoniae: keflex 04-28-19: wbc 13.8; hgb 8.5; hct 26.6; mcv 89.0; plt 387; glucose 141; bun 36; creat 1.21; k+ 4.6; na++ 134; ca 8.9; blood culture: no growth    NO NEW LABS.   Review of Systems  Reason unable to perform ROS: would not answer questions.     Physical Exam Constitutional:      General: She is not in acute distress.    Appearance: She is well-developed. She is not diaphoretic.  Neck:  Musculoskeletal:  Neck supple.     Thyroid: No thyromegaly.  Cardiovascular:     Rate and Rhythm: Normal rate and regular rhythm.     Pulses: Normal pulses.     Heart sounds: Murmur present.     Comments: 2/6 Pulmonary:     Effort: Pulmonary effort is normal. No respiratory distress.     Breath sounds: Normal breath sounds.  Abdominal:     General: Bowel sounds are normal. There is no distension.     Palpations: Abdomen is soft.     Tenderness: There is no abdominal tenderness.  Musculoskeletal:     Right lower leg: Edema present.     Left lower leg: Edema present.     Comments: Is able to move all extremities Status post left index finger amputation  Right wrist/hand very little swelling present; not tender to palpitation  Has trace bilateral lower extremity edema     Lymphadenopathy:     Cervical: No cervical adenopathy.  Skin:    General: Skin is warm and dry.     Comments: Bilateral lower extremities discolored   Neurological:     Mental Status: She is alert. Mental status is at baseline.  Psychiatric:     Comments: Somewhat withdrawn       ASSESSMENT/ PLAN:  TODAY:   1. Acute gout due to renal impairment involving right wrist: is stable will continue colchicine 0.3 mg daily; allopurinol 50 mg daily and prednisone 5 mg daily   2. Anemia due to stage 4 chronic kidney disease: is without change hgb 8.5 will continue hemocyte daily   3. Protein calore malnutrition severe: is without change albumin 2.7 will continue glucerna twice daily   4. Depression: is worse: will begin zoloft 25 mg daily   PREVIOUS  5. Hypomagnesemia: is stable mag 1.9 will monitor  6. chorinc diastolic CHF (Congestive heart failure) /demand ischemia: EF 50-55% (3-36-20) is stable will continue lasix 20 mg every other day   7. Atrial fibrillation, chronic: heart rate stable will continue eliquis 2.5 mg twice daily   8. Hypertensive heart and kidney disease with chronic diastolic congestive heart failure and  stage 4 chronic kidney disease is stable b/p 140/70 will continue norvasc 2.5 mg daily   9. Chronic low back pain: is worse will begin ultram 50 mg twice daily will monitor   10. UTI: will complete abt and will monitor her status.   11. Controlled type 2 diabetes mellitus with stage 4 chronic disease without long term current use of insulin: is stable her hgb a1c is 4.7; is off NPH will monitor her status  12. Stage 4 chronic kidney disease due to type 2 diabetes mellitus/hydroureterophrosis is without change bun 36; create 1.21; will monitor       MD is aware of resident's narcotic use and is in agreement with current plan of care. We will attempt to wean resident as apropriate   Ok Edwards NP [facility]  Contact [phone] Monday through Friday 8am- 5pm  After hours call [phone]

## 2019-05-08 ENCOUNTER — Non-Acute Institutional Stay (SKILLED_NURSING_FACILITY): Payer: Medicare Other | Admitting: Internal Medicine

## 2019-05-08 ENCOUNTER — Encounter: Payer: Self-pay | Admitting: Internal Medicine

## 2019-05-08 DIAGNOSIS — F32A Depression, unspecified: Secondary | ICD-10-CM | POA: Insufficient documentation

## 2019-05-08 DIAGNOSIS — G8929 Other chronic pain: Secondary | ICD-10-CM

## 2019-05-08 DIAGNOSIS — F0390 Unspecified dementia without behavioral disturbance: Secondary | ICD-10-CM

## 2019-05-08 DIAGNOSIS — M545 Low back pain, unspecified: Secondary | ICD-10-CM

## 2019-05-08 DIAGNOSIS — I482 Chronic atrial fibrillation, unspecified: Secondary | ICD-10-CM | POA: Diagnosis not present

## 2019-05-08 DIAGNOSIS — N390 Urinary tract infection, site not specified: Secondary | ICD-10-CM

## 2019-05-08 DIAGNOSIS — F329 Major depressive disorder, single episode, unspecified: Secondary | ICD-10-CM | POA: Insufficient documentation

## 2019-05-08 DIAGNOSIS — F322 Major depressive disorder, single episode, severe without psychotic features: Secondary | ICD-10-CM | POA: Diagnosis not present

## 2019-05-08 NOTE — Assessment & Plan Note (Signed)
No behavioral issues reported.  Staff is concerned about significant depression.  Clinically this is present.  Low-dose SSRI has been initiated.

## 2019-05-08 NOTE — Assessment & Plan Note (Addendum)
Low-dose SSRI has been initiated and can be titrated based on clinical response.

## 2019-05-08 NOTE — Progress Notes (Signed)
NURSING HOME LOCATION:  [facility] ROOM NUMBER: 157/D   CODE STATUS: DNR   PCP:  Iona Beard MD  This is a nursing facility follow up for specific acute issue of [place] readmission within 30 days  Interim medical record and care since last [place] visit was updated with review of diagnostic studies and change in clinical status since last visit were documented.  HPI: The patient is a long-term resident who went to the ED 04/25/2019 for back pain without definite etiologic factors such as a witnessed fall.  The patient had been having some difficulty ambulating for 2 &1/2 weeks Most recent admission prior to this ED visit was for E. coli and Klebsiella urosepsis. During the urosepsis admission she apparently had acute gout of the right wrist.  Treatment options were limited by the patient's CKD. The patient was kept in the emergency room untill 6/29.  She received Keflex for greater than 100,000 Klebsiella pneumoniae colonies on urine culture 6/26 .  On 629 she spiked a temperature to 101 and received Rocephin.  There was significant warmth at the right wrist with subjective pain.  CT of the wrist did not suggest septic arthritis and acute gout was diagnosed.  Because of the back pain MRI was performed which revealed degenerative disc disease but could not rule out osteomyelitis.  CT scan of the lumbosacral spine was performed and compared with prior imaging.  No acute spinal infection was documented and return to the SNF was pursued.  She was to complete oral Keflex 05/09/2019.  Past medical history includes dementia, protein-caloric malnutrition, chronic anemia, type II secondary AV block, hydroureteronephrosis, chronic A. fib, type 2 diabetes, essential hypertension, and chronic diastolic heart failure. She has had an amputation of the left index finger and abdominal hysterectomy.  Family history is noncontributory due to advanced age.  She does not drink and has  never smoked.  Review of systems: Could not be completed because of dementia.  She could not tell me the date.  She denies any active symptoms including back pain or wrist pain.  She appears withdrawn and depressed.  Answers are monosyllabic and delivered in a whisper.  Physical exam:  Pertinent or positive findings: She appears chronically ill.  She has pattern alopecia with vitiligo and keratinization changes over the crown.  She appears to have only one mandibular tooth remaining and it is deformed and plaque encrusted.  Heart rhythm is irregular.  Breath sounds are decreased.  She exhibited no spontaneous range of motion.  She did not follow commands well.  1/2+ edema was present at the sock line.  Pedal pulses are decreased.  Fusiform changes of the left wrist are present.  There was no tenderness to palpation over either wrist.  General appearance: nourished; no acute distress, increased work of breathing is present.   Lymphatic: No lymphadenopathy about the head, neck, axilla. Eyes: No conjunctival inflammation or lid edema is present. There is no scleral icterus. Ears:  External ear exam shows no significant lesions or deformities.   Nose:  External nasal examination shows no deformity or inflammation. Nasal mucosa are pink and moist without lesions, exudates Neck:  No thyromegaly, masses, tenderness noted.    Heart:  No gallop, murmur, click, rub .  Lungs: without wheezes, rhonchi, rales, rubs. Abdomen: Bowel sounds are normal. Abdomen is soft and nontender with no organomegaly, hernias, masses. GU: Deferred  Extremities:  No cyanosis, clubbing Neurologic exam : Balance, Rhomberg, finger to nose testing could  not be completed due to clinical state Skin: Warm & dry w/o tenting. No significant lesions or rash.  See summary under each active problem in the Problem List with associated updated therapeutic plan

## 2019-05-08 NOTE — Assessment & Plan Note (Signed)
Clinically she appears to be in A. fib with a controlled ventricular response.  She is on a novel oral anticoagulant.

## 2019-05-08 NOTE — Patient Instructions (Signed)
See assessment and plan under each diagnosis in the problem list and acutely for this visit 

## 2019-05-08 NOTE — Assessment & Plan Note (Addendum)
Adequate pain control with present regimen.  Topical anti-inflammatories are being employed.  Tramadol dose is maxed out due to her CKD.  The CKD limits medication options. Additionally must monitor for Serotonin Syndrome with combo of Tramadol & newly Rxed SSRI.

## 2019-05-08 NOTE — Assessment & Plan Note (Addendum)
Continue Keflex until 05/09/2019 to complete 10-day course

## 2019-05-13 ENCOUNTER — Encounter (HOSPITAL_COMMUNITY)
Admission: RE | Admit: 2019-05-13 | Discharge: 2019-05-13 | Disposition: A | Discharge status: Home / Self Care | Payer: Medicare Other | Source: Ambulatory Visit | Attending: Adult Health | Admitting: Adult Health

## 2019-05-13 ENCOUNTER — Non-Acute Institutional Stay (SKILLED_NURSING_FACILITY): Payer: Medicare Other | Admitting: Adult Health

## 2019-05-13 ENCOUNTER — Encounter: Payer: Self-pay | Admitting: Adult Health

## 2019-05-13 DIAGNOSIS — E119 Type 2 diabetes mellitus without complications: Secondary | ICD-10-CM | POA: Diagnosis not present

## 2019-05-13 DIAGNOSIS — I5032 Chronic diastolic (congestive) heart failure: Secondary | ICD-10-CM

## 2019-05-13 DIAGNOSIS — L89150 Pressure ulcer of sacral region, unstageable: Secondary | ICD-10-CM

## 2019-05-13 DIAGNOSIS — I130 Hypertensive heart and chronic kidney disease with heart failure and stage 1 through stage 4 chronic kidney disease, or unspecified chronic kidney disease: Secondary | ICD-10-CM

## 2019-05-13 DIAGNOSIS — N184 Chronic kidney disease, stage 4 (severe): Secondary | ICD-10-CM | POA: Diagnosis not present

## 2019-05-13 DIAGNOSIS — B370 Candidal stomatitis: Secondary | ICD-10-CM | POA: Diagnosis not present

## 2019-05-13 NOTE — Progress Notes (Signed)
Location:   [facility] Room Number: [facility] of Service:  SNF (31)   CODE STATUS: DNR  Allergies  Allergen Reactions  . Lactose Intolerance (Gi) Other (See Comments)    Upset stomach    Chief Complaint  Patient presents with  . Acute Visit    Wound    HPI:  She has an unstagable sacral wound with slough present. Her blood pressure readings are elevated; she has a significant case of thrush. There are no reports of pain present; her appetite is poor; no reports of fevers present.    Past Medical History:  Diagnosis Date  . A-fib ([place])   . Anemia   . Arthritis    right knee  . Cataract   . CHF (congestive heart failure) ([place])   . CKD (chronic kidney disease), stage III ([place])   . Dehydration   . Diabetes mellitus   . Gout   . Headache(784.0)   . Hypertension   . UTI (lower urinary tract infection)     Past Surgical History:  Procedure Laterality Date  . ABDOMINAL HYSTERECTOMY    . AMPUTATION FINGER Left 01/22/2019   Procedure: Amputation Left Index Finger First Joint;  Surgeon: Iran Planas, MD;  Location: [facility];  Service: Orthopedics;  Laterality: Left;  . ANTERIOR AND POSTERIOR REPAIR  05/17/2011   Procedure: ANTERIOR (CYSTOCELE) AND POSTERIOR REPAIR (RECTOCELE);  Surgeon: Eli Hose, MD;  Location: [facility];  Service: Gynecology;  Laterality: N/A;  Anterior repair with TVT bladder sling and cystoscopy  . BLADDER SUSPENSION  05/17/2011   Procedure: TRANSVAGINAL TAPE (TVT) PROCEDURE;  Surgeon: Eli Hose, MD;  Location: [facility];  Service: Gynecology;  Laterality: N/A;  . BREAST SURGERY     breast biopsy  . I&D EXTREMITY Left 01/22/2019   Procedure: IRRIGATION AND DEBRIDEMENT OF LEFT INDEX FINGER,;  Surgeon: Iran Planas, MD;  Location: [facility];  Service: Orthopedics;  Laterality: Left;  . IR FLUORO GUIDED NEEDLE PLC ASPIRATION/INJECTION LOC  04/05/2019    Social History   Socioeconomic History  . Marital status: Single    Spouse name: Not on file  . Number of children: Not on file  . Years of education: Not on file  . Highest education level: Not on file  Occupational History  . Not on file  Social Needs  . Financial resource strain: Not on file  . Food insecurity    Worry: Not on file    Inability: Not on file  . Transportation needs    Medical: Not on file    Non-medical: Not on file  Tobacco Use  . Smoking status: Never Smoker  . Smokeless tobacco: Never Used  Substance and Sexual Activity  . Alcohol use: No  . Drug use: No  . Sexual activity: Never    Birth control/protection: Abstinence  Lifestyle  . Physical activity    Days per week: Not on file    Minutes per session: Not on file  . Stress: Not on file  Relationships  . Social Herbalist on phone: Not on file    Gets together: Not on file    Attends religious service: Not on file    Active member of club or organization: Not on file    Attends meetings of clubs or organizations: Not on file    Relationship status: Not on file  . Intimate partner violence    Fear of current or ex partner: Not on  file    Emotionally abused: Not on file    Physically abused: Not on file    Forced sexual activity: Not on file  Other Topics Concern  . Not on file  Social History Narrative  . Not on file   History reviewed. No pertinent family history.    VITAL SIGNS BP (!) 157/95   Pulse 71   Temp 98.6 F (37 C)   Resp 20   Ht 5\' 6"  (1.676 m)   Wt 143 lb 9.6 oz (65.1 kg)   BMI 23.18 kg/m   Outpatient Encounter Medications as of 05/13/2019  Medication Sig  . acetaminophen (TYLENOL) 500 MG tablet Take 500 mg by mouth every 6 (six) hours as needed for mild pain.   Marland Kitchen allopurinol (ZYLOPRIM) 100 MG tablet Take 0.5 tablets (50 mg total) by mouth daily.  Marland Kitchen amLODipine (NORVASC) 2.5 MG tablet Take 1 tablet (2.5 mg total) by mouth daily.  Marland Kitchen apixaban (ELIQUIS) 2.5 MG TABS tablet Take 1 tablet (2.5 mg total) by mouth 2 (two) times  daily.  . colchicine 0.6 MG tablet Take 0.5 tablets (0.3 mg total) by mouth daily.  . feeding supplement, GLUCERNA SHAKE, (GLUCERNA SHAKE) LIQD Take 237 mLs by mouth 3 (three) times daily between meals.  . ferrous fumarate (HEMOCYTE - 106 MG FE) 325 (106 Fe) MG TABS tablet Take 1 tablet (106 mg of iron total) by mouth every morning.  . furosemide (LASIX) 20 MG tablet Take 20 mg by mouth every other day.   . NON FORMULARY Diet Type:  Dysphagia 2  . olopatadine (PATANOL) 0.1 % ophthalmic solution Place 1 drop into both eyes 2 (two) times daily.  . predniSONE (DELTASONE) 5 MG tablet Take 5 mg by mouth daily with breakfast.  . sertraline (ZOLOFT) 25 MG tablet Take 25 mg by mouth daily.  . traMADol (ULTRAM) 50 MG tablet Take 1 tablet (50 mg total) by mouth 2 (two) times daily.   No facility-administered encounter medications on file as of 05/13/2019.      SIGNIFICANT DIAGNOSTIC EXAMS  PREVIOUS:   03-25-19: chest x-ray: no active disease  03-25-19: ct of abdomen and pelvis:  1. Extensive colonic diverticulosis without evidence to suggest an acute diverticulitis at this time. 2. Mild left hydroureteronephrosis terminating in the proximal third of the left ureter. No obstructing stone identified. The possibility of a ureteral stricture could be considered. 3. Small amount of gas non dependently in the urinary bladder. This may be iatrogenic if there has been recent catheterization. In the absence of catheterization, correlation with urinalysis is suggested to exclude the possibility of urinary tract infection with gas-forming organisms. 4. Aortic atherosclerosis. 5. Mild cardiomegaly. 6. Trace amount of biliary sludge and/or tiny gallstones lying dependently in the gallbladder. No findings to suggest an acute cholecystitis at this time.  03-25-19: ct of head: chronic atrophy and ischemic changes without acute abnormality.   04-02-19: right wrist x-ray: chronic changes without acute abnormality    04-25-19: ct of head; No acute intracranial abnormality identified. Stable CT appearance of advanced chronic small vessel disease.  04-25-19: lumbar spine x-ray:  1. Scoliosis and associated disc osteophytic disease. 2. No acute findings in the lumbar spine identified.  04-28-19: ct of right wrist:  1. Severe osteopenia. Degree of osteopenia limits bone detail and evaluation for subtle traumatic injury. No acute osseous injury of the right wrist. Given the patient's severe osteopenia, if there is persistent clinical concern for an occult fracture, a MRI of the wrist  is recommended for increased sensitivity.  04-28-19: ct of lumbar spine:  Moderate lumbar scoliosis. Multilevel degenerative change causing spinal and foraminal stenosis at multiple levels due to spurring as Above  04-28-19: MRI of lumbar spine:  1. Fluid signal within the L2-3, L3-4 and L5-S1 disc space is with mild endplate edema at L8-X2. This corresponds to areas of vacuum disc phenomenon seen on recent CT abdomen/pelvis dated 03/25/2019. Overall findings are more suggestive of degenerative disc disease and less likely discitis-osteomyelitis. It would be helpful to have a more recent CT of the lumbar spine to compare with the CT abdomen/pelvis dated 03/24/2021 evaluate for any interval changes. 2. Severe bilateral facet arthropathy at L5-S1 with perifacet inflammatory changes. 3. Diffuse lumbar spine spondylosis as described above.  NO NEW EXAMS.    LABS REVIEWED PREVIOUS:   03-25-19: wbc 7.9; hgb 9.2; hct 29.5; mcv 90.8 plt 174; glucose 82; bun 45; creat 1.57; k+ 3.7; na++ 144; ca 9.4; liver normal albumin 3.6 BNP 736.3 blood culture: e-coli/enterobacteriaceae species urine culture: e-coli klebsiella pneumoniae  03-28-19" wbc 7.4; hgb 8.4; ht 26.5; mcv 90.8; plt 171 glucose 79; bun 36; creat 1.37; k+ 4.6; na++ 141; ca 9.5  03-30-19: wbc 8.9; hgb 8.9; hct 28.2; mcv 87.9 ;plt 208 glucose 116; bun 24; creat 1.26; k+ 4.2; na++ 135;  ca 9.3 phos 2.0; albumin 3.1 mag 1.5  04-01-19: wbc 13.3; hgb 8.7; hct 27.2; mcv 87.2; plt 269; glucose 117; bun 24; creat 1.42; k+ 4.3; na++ 135; ca 9.3 phos 2.2; albumin 2.6; mag 1.6 04-03-19: wbc 8.3; hgb 8.8; hct 28.3; mcv 89.0; plt 414; glucose 105; bun 39; creat 1.41; k+ 4.8; na++ 133; ca 9.4 liver normal albumin 2.7 04-04-19: mag 1.9   04-06-19: glucose 97; bun 47; creat 1.59; k+ 4.7; na++ 137; ca 9.2 04-07-19: glucose 96; bun 47; creat 1.58; k+ 4.7 na++ 137; ca 9.0  04-11-19: wbc 8.1; hgb 7.6; hct 25.8; mcv 93.5; plt 658; glucose 76; bun 69; creat 1.56; k+ 5.5; na++ 137; ca 9.3; uric acid 8.5; hgb a1c 4.7  04-14-19: hgb 7.7; hct 26.2  glucose 55; bun 61; creat 1.66; k+ 4.6; na++ 140; ca 9.3;  04-16-19: hgb 8.6; hct 29.5; glucose 87; bun 69; creat 1.47; k+ 5.7; na++ 143; ca 8.9  04-17-19: glucose 46 bun 62; creat 1.70; k+ 4.6; na++ 143; ca 8.4  04-22-19: glucose 158; bun 84; creat 1.62; k+ 4.9; na++ 136; ca 8.6 04-25-19: urine culture: klebsiella pneumoniae: keflex 04-28-19: wbc 13.8; hgb 8.5; hct 26.6; mcv 89.0; plt 387; glucose 141; bun 36; creat 1.21; k+ 4.6; na++ 134; ca 8.9; blood culture: no growth    NO NEW LABS.   Review of Systems  Reason unable to perform ROS: would not answer questions     Physical Exam Constitutional:      General: She is not in acute distress.    Appearance: She is underweight. She is not diaphoretic.  HENT:     Mouth/Throat:     Comments: Significant thrush present  Neck:     Musculoskeletal: Neck supple.     Thyroid: No thyromegaly.  Cardiovascular:     Rate and Rhythm: Normal rate and regular rhythm.     Heart sounds: Murmur present.     Comments: 2/6 Pedal pulses are faint  Pulmonary:     Effort: Pulmonary effort is normal. No respiratory distress.     Breath sounds: Normal breath sounds.  Abdominal:     General: Bowel sounds are normal. There  is no distension.     Palpations: Abdomen is soft.     Tenderness: There is no abdominal tenderness.   Musculoskeletal:     Right lower leg: Edema present.     Left lower leg: Edema present.     Comments: Is able to move all extremities Status post left index finger amputation  Right wrist/hand very little swelling present; not tender to palpitation  Has trace bilateral lower extremity edema      Lymphadenopathy:     Cervical: No cervical adenopathy.  Skin:    General: Skin is warm and dry.     Comments: Unstaged sacral ulcer: 3.2 x 6.0 cm yellow slough in wound bed; with foul drainage present   Neurological:     Mental Status: She is alert. Mental status is at baseline.  Psychiatric:     Comments: Withdrawn        ASSESSMENT/ PLAN:  TODAY:   1. Hypertensive heart and kidney disease with chronic diastolic congestive heart failure and stage 4 chronic kidney disease: is worse: 157/95: will increase norvasc 5 mg daily and will monitor  2. Oral thrush: is worse; will begin diflucan 50 mg daily through 05-17-19 will have staff blush teeth and tongue twice daily   3. Unstagable pressure ulcer of sacral region: is worse: will culture wound bed; will continue current treatment with santyl;   Will check cbc; cmp        MD is aware of resident's narcotic use and is in agreement with current plan of care. We will attempt to wean resident as apropriate   Ok Edwards NP [facility]  Contact [phone] Monday through Friday 8am- 5pm  After hours call [phone]

## 2019-05-14 ENCOUNTER — Non-Acute Institutional Stay (SKILLED_NURSING_FACILITY): Payer: Medicare Other | Admitting: Adult Health

## 2019-05-14 ENCOUNTER — Encounter: Payer: Self-pay | Admitting: Adult Health

## 2019-05-14 ENCOUNTER — Encounter (HOSPITAL_COMMUNITY)
Admission: RE | Admit: 2019-05-14 | Discharge: 2019-05-14 | Disposition: A | Discharge status: Home / Self Care | Payer: Medicare Other | Source: Skilled Nursing Facility | Attending: *Deleted | Admitting: *Deleted

## 2019-05-14 DIAGNOSIS — B370 Candidal stomatitis: Secondary | ICD-10-CM | POA: Insufficient documentation

## 2019-05-14 DIAGNOSIS — I5032 Chronic diastolic (congestive) heart failure: Secondary | ICD-10-CM

## 2019-05-14 DIAGNOSIS — F322 Major depressive disorder, single episode, severe without psychotic features: Secondary | ICD-10-CM | POA: Diagnosis not present

## 2019-05-14 DIAGNOSIS — L89150 Pressure ulcer of sacral region, unstageable: Secondary | ICD-10-CM | POA: Diagnosis not present

## 2019-05-14 DIAGNOSIS — E119 Type 2 diabetes mellitus without complications: Secondary | ICD-10-CM | POA: Diagnosis not present

## 2019-05-14 LAB — COMPREHENSIVE METABOLIC PANEL
ALT: 17 U/L (ref 0–44)
AST: 23 U/L (ref 15–41)
Albumin: 2.5 g/dL — ABNORMAL LOW (ref 3.5–5.0)
Alkaline Phosphatase: 63 U/L (ref 38–126)
Anion gap: 9 (ref 5–15)
BUN: 56 mg/dL — ABNORMAL HIGH (ref 8–23)
CO2: 26 mmol/L (ref 22–32)
Calcium: 9.2 mg/dL (ref 8.9–10.3)
Chloride: 109 mmol/L (ref 98–111)
Creatinine, Ser: 1.57 mg/dL — ABNORMAL HIGH (ref 0.44–1.00)
GFR calc Af Amer: 34 mL/min — ABNORMAL LOW (ref >60)
GFR calc non Af Amer: 30 mL/min — ABNORMAL LOW (ref >60)
Glucose, Bld: 142 mg/dL — ABNORMAL HIGH (ref 70–99)
Potassium: 4.5 mmol/L (ref 3.5–5.1)
Sodium: 144 mmol/L (ref 135–145)
Total Bilirubin: 0.8 mg/dL (ref 0.3–1.2)
Total Protein: 6.6 g/dL (ref 6.5–8.1)

## 2019-05-14 LAB — CBC WITH DIFFERENTIAL/PLATELET
Abs Immature Granulocytes: 0.25 10*3/uL — ABNORMAL HIGH (ref 0.00–0.07)
Basophils Absolute: 0.1 10*3/uL (ref 0.0–0.1)
Basophils Relative: 1 %
Eosinophils Absolute: 0.1 10*3/uL (ref 0.0–0.5)
Eosinophils Relative: 1 %
HCT: 24.8 % — ABNORMAL LOW (ref 36.0–46.0)
Hemoglobin: 7.2 g/dL — ABNORMAL LOW (ref 12.0–15.0)
Immature Granulocytes: 3 %
Lymphocytes Relative: 9 %
Lymphs Abs: 0.9 10*3/uL (ref 0.7–4.0)
MCH: 27.2 pg (ref 26.0–34.0)
MCHC: 29 g/dL — ABNORMAL LOW (ref 30.0–36.0)
MCV: 93.6 fL (ref 80.0–100.0)
Monocytes Absolute: 0.6 10*3/uL (ref 0.1–1.0)
Monocytes Relative: 6 %
Neutro Abs: 8.2 10*3/uL — ABNORMAL HIGH (ref 1.7–7.7)
Neutrophils Relative %: 80 %
Platelets: 593 10*3/uL — ABNORMAL HIGH (ref 150–400)
RBC: 2.65 MIL/uL — ABNORMAL LOW (ref 3.87–5.11)
RDW: 17.6 % — ABNORMAL HIGH (ref 11.5–15.5)
WBC: 10.1 10*3/uL (ref 4.0–10.5)
nRBC: 0 % (ref 0.0–0.2)

## 2019-05-14 NOTE — Progress Notes (Signed)
Location:   [facility] Room Number: [facility] Service:  SNF (31)   CODE STATUS: DNR  Allergies  Allergen Reactions  . Lactose Intolerance (Gi) Other (See Comments)    Upset stomach    Chief Complaint  Patient presents with  . Medical Management of Chronic Issues        Depression major single episode severe:  Unstagable pressure ulcer of sacral region: Chronic diastolic CHF (congestive heart failure)  Weekly follow up for the first 30 days post hospitalization.  Care plan meeting.     HPI:  She is a 85 year old long term resident of this facility being seen for the management of her chronic illnesses: depression; chf; pressure ulcer. We have come together for her care plan meeting her son is present via phone. She is not eating well with the most 50% of meals. She is using her right hand. She is having weight loss. She has been started on prostat. She requires total care with her adls. Her son does not want her on zoloft; he does not feel that she is depressed. She is more withdrawn; appetite is poor.   Past Medical History:  Diagnosis Date  . A-fib ([place])   . Anemia   . Arthritis    right knee  . Cataract   . CHF (congestive heart failure) ([place])   . CKD (chronic kidney disease), stage III ([place])   . Dehydration   . Diabetes mellitus   . Gout   . Headache(784.0)   . Hypertension   . UTI (lower urinary tract infection)     Past Surgical History:  Procedure Laterality Date  . ABDOMINAL HYSTERECTOMY    . AMPUTATION FINGER Left 01/22/2019   Procedure: Amputation Left Index Finger First Joint;  Surgeon: Iran Planas, MD;  Location: [facility];  Service: Orthopedics;  Laterality: Left;  . ANTERIOR AND POSTERIOR REPAIR  05/17/2011   Procedure: ANTERIOR (CYSTOCELE) AND POSTERIOR REPAIR (RECTOCELE);  Surgeon: Eli Hose, MD;  Location: [facility];  Service: Gynecology;  Laterality: N/A;  Anterior repair with TVT bladder sling and cystoscopy  .  BLADDER SUSPENSION  05/17/2011   Procedure: TRANSVAGINAL TAPE (TVT) PROCEDURE;  Surgeon: Eli Hose, MD;  Location: [facility];  Service: Gynecology;  Laterality: N/A;  . BREAST SURGERY     breast biopsy  . I&D EXTREMITY Left 01/22/2019   Procedure: IRRIGATION AND DEBRIDEMENT OF LEFT INDEX FINGER,;  Surgeon: Iran Planas, MD;  Location: [facility];  Service: Orthopedics;  Laterality: Left;  . IR FLUORO GUIDED NEEDLE PLC ASPIRATION/INJECTION LOC  04/05/2019    Social History   Socioeconomic History  . Marital status: Single    Spouse name: Not on file  . Number of children: Not on file  . Years of education: Not on file  . Highest education level: Not on file  Occupational History  . Not on file  Social Needs  . Financial resource strain: Not on file  . Food insecurity    Worry: Not on file    Inability: Not on file  . Transportation needs    Medical: Not on file    Non-medical: Not on file  Tobacco Use  . Smoking status: Never Smoker  . Smokeless tobacco: Never Used  Substance and Sexual Activity  . Alcohol use: No  . Drug use: No  . Sexual activity: Never    Birth control/protection: Abstinence  Lifestyle  . Physical activity    Days  per week: Not on file    Minutes per session: Not on file  . Stress: Not on file  Relationships  . Social Herbalist on phone: Not on file    Gets together: Not on file    Attends religious service: Not on file    Active member of club or organization: Not on file    Attends meetings of clubs or organizations: Not on file    Relationship status: Not on file  . Intimate partner violence    Fear of current or ex partner: Not on file    Emotionally abused: Not on file    Physically abused: Not on file    Forced sexual activity: Not on file  Other Topics Concern  . Not on file  Social History Narrative  . Not on file   History reviewed. No pertinent family history.    VITAL SIGNS BP (!) 160/77   Pulse 69   Temp (!) 97.3  F (36.3 C)   Resp (!) 22   Ht 5\' 6"  (1.676 m)   Wt 143 lb 9.6 oz (65.1 kg)   BMI 23.18 kg/m   Outpatient Encounter Medications as of 05/14/2019  Medication Sig  . acetaminophen (TYLENOL) 500 MG tablet Take 500 mg by mouth every 6 (six) hours as needed for mild pain.   Marland Kitchen allopurinol (ZYLOPRIM) 100 MG tablet Take 0.5 tablets (50 mg total) by mouth daily.  Marland Kitchen amLODipine (NORVASC) 2.5 MG tablet Take 1 tablet (2.5 mg total) by mouth daily.  Marland Kitchen apixaban (ELIQUIS) 2.5 MG TABS tablet Take 1 tablet (2.5 mg total) by mouth 2 (two) times daily.  . colchicine 0.6 MG tablet Take 0.5 tablets (0.3 mg total) by mouth daily.  . collagenase (SANTYL) ointment Clean sacral wound with normal saline. Apply santyl ointment to necrotic tissue and cover with Allevyn dressing.  Change daily and as needed  . feeding supplement, GLUCERNA SHAKE, (GLUCERNA SHAKE) LIQD Take 237 mLs by mouth 3 (three) times daily between meals.  . ferrous fumarate (HEMOCYTE - 106 MG FE) 325 (106 Fe) MG TABS tablet Take 1 tablet (106 mg of iron total) by mouth every morning.  . fluconazole (DIFLUCAN) 50 MG tablet Take 50 mg by mouth daily.  . furosemide (LASIX) 20 MG tablet Take 20 mg by mouth every other day.   . NON FORMULARY Diet Type:  Dysphagia 2  . olopatadine (PATANOL) 0.1 % ophthalmic solution Place 1 drop into both eyes 2 (two) times daily.  . predniSONE (DELTASONE) 5 MG tablet Take 5 mg by mouth daily with breakfast.  . sertraline (ZOLOFT) 25 MG tablet Take 25 mg by mouth daily.  . traMADol (ULTRAM) 50 MG tablet Take 1 tablet (50 mg total) by mouth 2 (two) times daily.  . Wound Dressings (ALLEVYN ADHESIVE) PADS Apply foam dressing to bilateral heels once a day every 3 days   No facility-administered encounter medications on file as of 05/14/2019.      SIGNIFICANT DIAGNOSTIC EXAMS   PREVIOUS:   03-25-19: chest x-ray: no active disease  03-25-19: ct of abdomen and pelvis:  1. Extensive colonic diverticulosis without  evidence to suggest an acute diverticulitis at this time. 2. Mild left hydroureteronephrosis terminating in the proximal third of the left ureter. No obstructing stone identified. The possibility of a ureteral stricture could be considered. 3. Small amount of gas non dependently in the urinary bladder. This may be iatrogenic if there has been recent catheterization. In the absence of  catheterization, correlation with urinalysis is suggested to exclude the possibility of urinary tract infection with gas-forming organisms. 4. Aortic atherosclerosis. 5. Mild cardiomegaly. 6. Trace amount of biliary sludge and/or tiny gallstones lying dependently in the gallbladder. No findings to suggest an acute cholecystitis at this time.  03-25-19: ct of head: chronic atrophy and ischemic changes without acute abnormality.   04-02-19: right wrist x-ray: chronic changes without acute abnormality   04-25-19: ct of head; No acute intracranial abnormality identified. Stable CT appearance of advanced chronic small vessel disease.  04-25-19: lumbar spine x-ray:  1. Scoliosis and associated disc osteophytic disease. 2. No acute findings in the lumbar spine identified.  04-28-19: ct of right wrist:  1. Severe osteopenia. Degree of osteopenia limits bone detail and evaluation for subtle traumatic injury. No acute osseous injury of the right wrist. Given the patient's severe osteopenia, if there is persistent clinical concern for an occult fracture, a MRI of the wrist is recommended for increased sensitivity.  04-28-19: ct of lumbar spine:  Moderate lumbar scoliosis. Multilevel degenerative change causing spinal and foraminal stenosis at multiple levels due to spurring as Above  04-28-19: MRI of lumbar spine:  1. Fluid signal within the L2-3, L3-4 and L5-S1 disc space is with mild endplate edema at A2-Z3. This corresponds to areas of vacuum disc phenomenon seen on recent CT abdomen/pelvis dated 03/25/2019. Overall findings  are more suggestive of degenerative disc disease and less likely discitis-osteomyelitis. It would be helpful to have a more recent CT of the lumbar spine to compare with the CT abdomen/pelvis dated 03/24/2021 evaluate for any interval changes. 2. Severe bilateral facet arthropathy at L5-S1 with perifacet inflammatory changes. 3. Diffuse lumbar spine spondylosis as described above.  NO NEW EXAMS.    LABS REVIEWED PREVIOUS:   03-25-19: wbc 7.9; hgb 9.2; hct 29.5; mcv 90.8 plt 174; glucose 82; bun 45; creat 1.57; k+ 3.7; na++ 144; ca 9.4; liver normal albumin 3.6 BNP 736.3 blood culture: e-coli/enterobacteriaceae species urine culture: e-coli klebsiella pneumoniae  03-28-19" wbc 7.4; hgb 8.4; ht 26.5; mcv 90.8; plt 171 glucose 79; bun 36; creat 1.37; k+ 4.6; na++ 141; ca 9.5  03-30-19: wbc 8.9; hgb 8.9; hct 28.2; mcv 87.9 ;plt 208 glucose 116; bun 24; creat 1.26; k+ 4.2; na++ 135; ca 9.3 phos 2.0; albumin 3.1 mag 1.5  04-01-19: wbc 13.3; hgb 8.7; hct 27.2; mcv 87.2; plt 269; glucose 117; bun 24; creat 1.42; k+ 4.3; na++ 135; ca 9.3 phos 2.2; albumin 2.6; mag 1.6 04-03-19: wbc 8.3; hgb 8.8; hct 28.3; mcv 89.0; plt 414; glucose 105; bun 39; creat 1.41; k+ 4.8; na++ 133; ca 9.4 liver normal albumin 2.7 04-04-19: mag 1.9   04-06-19: glucose 97; bun 47; creat 1.59; k+ 4.7; na++ 137; ca 9.2 04-07-19: glucose 96; bun 47; creat 1.58; k+ 4.7 na++ 137; ca 9.0  04-11-19: wbc 8.1; hgb 7.6; hct 25.8; mcv 93.5; plt 658; glucose 76; bun 69; creat 1.56; k+ 5.5; na++ 137; ca 9.3; uric acid 8.5; hgb a1c 4.7  04-14-19: hgb 7.7; hct 26.2  glucose 55; bun 61; creat 1.66; k+ 4.6; na++ 140; ca 9.3;  04-16-19: hgb 8.6; hct 29.5; glucose 87; bun 69; creat 1.47; k+ 5.7; na++ 143; ca 8.9  04-17-19: glucose 46 bun 62; creat 1.70; k+ 4.6; na++ 143; ca 8.4  04-22-19: glucose 158; bun 84; creat 1.62; k+ 4.9; na++ 136; ca 8.6 04-25-19: urine culture: klebsiella pneumoniae: keflex 04-28-19: wbc 13.8; hgb 8.5; hct 26.6; mcv 89.0; plt 387; glucose  141; bun 36; creat 1.21; k+ 4.6; na++ 134; ca 8.9; blood culture: no growth    TODAY:   05-13-19: wbc 10.1; hgb 7.2; hct 24.8; mcv 93.6 plt 593; glucose 142; bun 56; creat 1.57; k+ 4.5; na++144; ca 9.2; liver normal albumin 2.5    Review of Systems  Reason unable to perform ROS: did not answer questions     Physical Exam Constitutional:      General: She is not in acute distress.    Appearance: She is well-developed. She is not diaphoretic.  HENT:     Mouth/Throat:     Comments: Thrush present  Neck:     Musculoskeletal: Neck supple.     Thyroid: No thyromegaly.  Cardiovascular:     Rate and Rhythm: Normal rate and regular rhythm.     Heart sounds: Murmur present.     Comments: 2/6 Pedal pulses faint Pulmonary:     Effort: Pulmonary effort is normal. No respiratory distress.     Breath sounds: Normal breath sounds.  Abdominal:     General: Bowel sounds are normal. There is no distension.     Palpations: Abdomen is soft.     Tenderness: There is no abdominal tenderness.  Musculoskeletal:     Right lower leg: Edema present.     Left lower leg: Edema present.     Comments: Is able to move all extremities Status post left index finger amputation  Right wrist/hand very little swelling present; not tender to palpitation  Has trace bilateral lower extremity edema       Lymphadenopathy:     Cervical: No cervical adenopathy.  Skin:    General: Skin is warm and dry.     Comments: Unstaged sacral ulcer: 3.2 x 6.0 cm yellow slough in wound bed; with foul drainage present    Neurological:     Mental Status: She is alert.     Comments: Is withdrawn     ASSESSMENT/ PLAN:   TODAY:   1. Depression major single episode severe: without change: will stop zoloft per her son request   2. Unstagable pressure ulcer of sacral region: is without change culture results pending; will continue current treat and will monitor her status.   3. Chronic diastolic CHF (congestive heart  failure) EF 50-55% (01-23-19) is stable will continue lasix 20 mg every other day.    PREVIOUS  4. Hypomagnesemia: is stable mag 1.9 will monitor  5. Atrial fibrillation, chronic: heart rate stable will continue eliquis 2.5 mg twice daily   6. Hypertensive heart and kidney disease with chronic diastolic congestive heart failure and stage 4 chronic kidney disease is stable b/p 160/77 will continue norvasc 2.5 mg daily   7. Chronic low back pain: stable will continue ultram 50 mg twice daily    8. Controlled type 2 diabetes mellitus with stage 4 chronic disease without long term current use of insulin: is stable her hgb a1c is 4.7; is off NPH will monitor her status  9. Stage 4 chronic kidney disease due to type 2 diabetes mellitus/hydroureterophrosis is without change bun 56; create 1.57; will monitor   10. Acute gout due to renal impairment involving right wrist: is stable will continue colchicine 0.3 mg daily; allopurinol 50 mg daily and prednisone 5 mg daily   11. Anemia due to stage 4 chronic kidney disease: is without change hgb 7.2 will continue hemocyte daily   12. Protein calore malnutrition severe: is without change albumin 2.5 will continue glucerna twice daily  13. Oral thrush: little change will complete diflucan    MD is aware of resident's narcotic use and is in agreement with current plan of care. We will attempt to wean resident as apropriate   Ok Edwards NP [facility]  Contact [phone] Monday through Friday 8am- 5pm  After hours call [phone]

## 2019-05-15 LAB — AEROBIC CULTURE W GRAM STAIN (SUPERFICIAL SPECIMEN)

## 2019-05-20 DIAGNOSIS — E1121 Type 2 diabetes mellitus with diabetic nephropathy: Secondary | ICD-10-CM | POA: Diagnosis not present

## 2019-05-20 DIAGNOSIS — I501 Left ventricular failure: Secondary | ICD-10-CM | POA: Diagnosis not present

## 2019-05-20 DIAGNOSIS — N183 Chronic kidney disease, stage 3 (moderate): Secondary | ICD-10-CM | POA: Diagnosis not present

## 2019-05-20 DIAGNOSIS — M1000 Idiopathic gout, unspecified site: Secondary | ICD-10-CM | POA: Diagnosis not present

## 2019-05-20 DIAGNOSIS — D631 Anemia in chronic kidney disease: Secondary | ICD-10-CM | POA: Diagnosis not present

## 2019-05-21 ENCOUNTER — Encounter: Payer: Self-pay | Admitting: Adult Health

## 2019-05-21 ENCOUNTER — Non-Acute Institutional Stay (SKILLED_NURSING_FACILITY): Payer: Medicare Other | Admitting: Adult Health

## 2019-05-21 DIAGNOSIS — L89150 Pressure ulcer of sacral region, unstageable: Secondary | ICD-10-CM

## 2019-05-21 DIAGNOSIS — N184 Chronic kidney disease, stage 4 (severe): Secondary | ICD-10-CM

## 2019-05-21 DIAGNOSIS — I482 Chronic atrial fibrillation, unspecified: Secondary | ICD-10-CM | POA: Diagnosis not present

## 2019-05-21 DIAGNOSIS — I130 Hypertensive heart and chronic kidney disease with heart failure and stage 1 through stage 4 chronic kidney disease, or unspecified chronic kidney disease: Secondary | ICD-10-CM | POA: Diagnosis not present

## 2019-05-21 DIAGNOSIS — I5032 Chronic diastolic (congestive) heart failure: Secondary | ICD-10-CM | POA: Diagnosis not present

## 2019-05-21 NOTE — Progress Notes (Signed)
Location:   [facility] Room Number: [place] of Service:  SNF (31)   CODE STATUS: DNR  Allergies  Allergen Reactions  . Lactose Intolerance (Gi) Other (See Comments)    Upset stomach    Chief Complaint  Patient presents with  . Medical Management of Chronic Issues        Unstagable pressure ulcer of sacral region:  Atrial fibrillation, chronic:   Hypertensive heart and kidney disease with chronic diastolic congestive heart failure and stage 4 chronic kidney disease:   Weekly follow up for the first 30 days post hospitalization.     HPI:  She is a 85 year old long term resident of this facility being seen for the management of her chronic illnesses: pressure ulcer; afib; hypertensive heart disease. She appetite remains poor. There are no reports of uncontrolled pain; no reports of agitation. She continues to be withdrawn.   Past Medical History:  Diagnosis Date  . A-fib ([place])   . Anemia   . Arthritis    right knee  . Cataract   . CHF (congestive heart failure) ([place])   . CKD (chronic kidney disease), stage III ([place])   . Dehydration   . Diabetes mellitus   . Gout   . Headache(784.0)   . Hypertension   . UTI (lower urinary tract infection)     Past Surgical History:  Procedure Laterality Date  . ABDOMINAL HYSTERECTOMY    . AMPUTATION FINGER Left 01/22/2019   Procedure: Amputation Left Index Finger First Joint;  Surgeon: Iran Planas, MD;  Location: [facility];  Service: Orthopedics;  Laterality: Left;  . ANTERIOR AND POSTERIOR REPAIR  05/17/2011   Procedure: ANTERIOR (CYSTOCELE) AND POSTERIOR REPAIR (RECTOCELE);  Surgeon: Eli Hose, MD;  Location: [facility];  Service: Gynecology;  Laterality: N/A;  Anterior repair with TVT bladder sling and cystoscopy  . BLADDER SUSPENSION  05/17/2011   Procedure: TRANSVAGINAL TAPE (TVT) PROCEDURE;  Surgeon: Eli Hose, MD;  Location: [facility];  Service: Gynecology;  Laterality: N/A;  . BREAST SURGERY      breast biopsy  . I&D EXTREMITY Left 01/22/2019   Procedure: IRRIGATION AND DEBRIDEMENT OF LEFT INDEX FINGER,;  Surgeon: Iran Planas, MD;  Location: [facility];  Service: Orthopedics;  Laterality: Left;  . IR FLUORO GUIDED NEEDLE PLC ASPIRATION/INJECTION LOC  04/05/2019    Social History   Socioeconomic History  . Marital status: Single    Spouse name: Not on file  . Number of children: Not on file  . Years of education: Not on file  . Highest education level: Not on file  Occupational History  . Not on file  Social Needs  . Financial resource strain: Not on file  . Food insecurity    Worry: Not on file    Inability: Not on file  . Transportation needs    Medical: Not on file    Non-medical: Not on file  Tobacco Use  . Smoking status: Never Smoker  . Smokeless tobacco: Never Used  Substance and Sexual Activity  . Alcohol use: No  . Drug use: No  . Sexual activity: Never    Birth control/protection: Abstinence  Lifestyle  . Physical activity    Days per week: Not on file    Minutes per session: Not on file  . Stress: Not on file  Relationships  . Social Herbalist on phone: Not on file    Gets together: Not on file  Attends religious service: Not on file    Active member of club or organization: Not on file    Attends meetings of clubs or organizations: Not on file    Relationship status: Not on file  . Intimate partner violence    Fear of current or ex partner: Not on file    Emotionally abused: Not on file    Physically abused: Not on file    Forced sexual activity: Not on file  Other Topics Concern  . Not on file  Social History Narrative  . Not on file   History reviewed. No pertinent family history.    VITAL SIGNS BP 132/64   Pulse 92   Temp 97.8 F (36.6 C)   Resp 20   Ht 5\' 6"  (1.676 m)   Wt 138 lb (62.6 kg)   BMI 22.27 kg/m   Outpatient Encounter Medications as of 05/21/2019  Medication Sig  . acetaminophen (TYLENOL) 500 MG tablet  Take 500 mg by mouth every 6 (six) hours as needed for mild pain.   Marland Kitchen allopurinol (ZYLOPRIM) 100 MG tablet Take 0.5 tablets (50 mg total) by mouth daily.  . Amino Acids-Protein Hydrolys (FEEDING SUPPLEMENT, PRO-STAT SUGAR FREE 64,) LIQD Take 30 mLs by mouth 2 (two) times daily with a meal.  . amLODipine (NORVASC) 5 MG tablet Take 5 mg by mouth daily.  Marland Kitchen apixaban (ELIQUIS) 2.5 MG TABS tablet Take 1 tablet (2.5 mg total) by mouth 2 (two) times daily.  . colchicine 0.6 MG tablet Take 0.5 tablets (0.3 mg total) by mouth daily.  . collagenase (SANTYL) ointment Clean sacral wound with normal saline. Apply santyl ointment to necrotic tissue and cover with Allevyn dressing.  Change daily and as needed  . feeding supplement, GLUCERNA SHAKE, (GLUCERNA SHAKE) LIQD Take 237 mLs by mouth 3 (three) times daily between meals.  . ferrous fumarate (HEMOCYTE - 106 MG FE) 325 (106 Fe) MG TABS tablet Take 1 tablet (106 mg of iron total) by mouth every morning.  . furosemide (LASIX) 20 MG tablet Take 20 mg by mouth every other day.   . NON FORMULARY Diet Type:  Dysphagia 2  . olopatadine (PATANOL) 0.1 % ophthalmic solution Place 1 drop into both eyes 2 (two) times daily.  . predniSONE (DELTASONE) 5 MG tablet Take 5 mg by mouth daily with breakfast.  . traMADol (ULTRAM) 50 MG tablet Take 1 tablet (50 mg total) by mouth 2 (two) times daily.  . Wound Dressings (ALLEVYN ADHESIVE) PADS Apply foam dressing to bilateral heels once a day every 3 days  . [DISCONTINUED] amLODipine (NORVASC) 2.5 MG tablet Take 1 tablet (2.5 mg total) by mouth daily. (Patient not taking: Reported on 05/21/2019)  . [DISCONTINUED] sertraline (ZOLOFT) 25 MG tablet Take 25 mg by mouth daily.   No facility-administered encounter medications on file as of 05/21/2019.      SIGNIFICANT DIAGNOSTIC EXAMS  PREVIOUS:   03-25-19: chest x-ray: no active disease  03-25-19: ct of abdomen and pelvis:  1. Extensive colonic diverticulosis without evidence to  suggest an acute diverticulitis at this time. 2. Mild left hydroureteronephrosis terminating in the proximal third of the left ureter. No obstructing stone identified. The possibility of a ureteral stricture could be considered. 3. Small amount of gas non dependently in the urinary bladder. This may be iatrogenic if there has been recent catheterization. In the absence of catheterization, correlation with urinalysis is suggested to exclude the possibility of urinary tract infection with gas-forming organisms. 4. Aortic atherosclerosis.  5. Mild cardiomegaly. 6. Trace amount of biliary sludge and/or tiny gallstones lying dependently in the gallbladder. No findings to suggest an acute cholecystitis at this time.  03-25-19: ct of head: chronic atrophy and ischemic changes without acute abnormality.   04-02-19: right wrist x-ray: chronic changes without acute abnormality   04-25-19: ct of head; No acute intracranial abnormality identified. Stable CT appearance of advanced chronic small vessel disease.  04-25-19: lumbar spine x-ray:  1. Scoliosis and associated disc osteophytic disease. 2. No acute findings in the lumbar spine identified.  04-28-19: ct of right wrist:  1. Severe osteopenia. Degree of osteopenia limits bone detail and evaluation for subtle traumatic injury. No acute osseous injury of the right wrist. Given the patient's severe osteopenia, if there is persistent clinical concern for an occult fracture, a MRI of the wrist is recommended for increased sensitivity.  04-28-19: ct of lumbar spine:  Moderate lumbar scoliosis. Multilevel degenerative change causing spinal and foraminal stenosis at multiple levels due to spurring as Above  04-28-19: MRI of lumbar spine:  1. Fluid signal within the L2-3, L3-4 and L5-S1 disc space is with mild endplate edema at L8-V5. This corresponds to areas of vacuum disc phenomenon seen on recent CT abdomen/pelvis dated 03/25/2019. Overall findings are more  suggestive of degenerative disc disease and less likely discitis-osteomyelitis. It would be helpful to have a more recent CT of the lumbar spine to compare with the CT abdomen/pelvis dated 03/24/2021 evaluate for any interval changes. 2. Severe bilateral facet arthropathy at L5-S1 with perifacet inflammatory changes. 3. Diffuse lumbar spine spondylosis as described above.  NO NEW EXAMS.    LABS REVIEWED PREVIOUS:   03-25-19: wbc 7.9; hgb 9.2; hct 29.5; mcv 90.8 plt 174; glucose 82; bun 45; creat 1.57; k+ 3.7; na++ 144; ca 9.4; liver normal albumin 3.6 BNP 736.3 blood culture: e-coli/enterobacteriaceae species urine culture: e-coli klebsiella pneumoniae  03-28-19" wbc 7.4; hgb 8.4; ht 26.5; mcv 90.8; plt 171 glucose 79; bun 36; creat 1.37; k+ 4.6; na++ 141; ca 9.5  03-30-19: wbc 8.9; hgb 8.9; hct 28.2; mcv 87.9 ;plt 208 glucose 116; bun 24; creat 1.26; k+ 4.2; na++ 135; ca 9.3 phos 2.0; albumin 3.1 mag 1.5  04-01-19: wbc 13.3; hgb 8.7; hct 27.2; mcv 87.2; plt 269; glucose 117; bun 24; creat 1.42; k+ 4.3; na++ 135; ca 9.3 phos 2.2; albumin 2.6; mag 1.6 04-03-19: wbc 8.3; hgb 8.8; hct 28.3; mcv 89.0; plt 414; glucose 105; bun 39; creat 1.41; k+ 4.8; na++ 133; ca 9.4 liver normal albumin 2.7 04-04-19: mag 1.9   04-06-19: glucose 97; bun 47; creat 1.59; k+ 4.7; na++ 137; ca 9.2 04-07-19: glucose 96; bun 47; creat 1.58; k+ 4.7 na++ 137; ca 9.0  04-11-19: wbc 8.1; hgb 7.6; hct 25.8; mcv 93.5; plt 658; glucose 76; bun 69; creat 1.56; k+ 5.5; na++ 137; ca 9.3; uric acid 8.5; hgb a1c 4.7  04-14-19: hgb 7.7; hct 26.2  glucose 55; bun 61; creat 1.66; k+ 4.6; na++ 140; ca 9.3;  04-16-19: hgb 8.6; hct 29.5; glucose 87; bun 69; creat 1.47; k+ 5.7; na++ 143; ca 8.9  04-17-19: glucose 46 bun 62; creat 1.70; k+ 4.6; na++ 143; ca 8.4  04-22-19: glucose 158; bun 84; creat 1.62; k+ 4.9; na++ 136; ca 8.6 04-25-19: urine culture: klebsiella pneumoniae: keflex 04-28-19: wbc 13.8; hgb 8.5; hct 26.6; mcv 89.0; plt 387; glucose 141; bun  36; creat 1.21; k+ 4.6; na++ 134; ca 8.9; blood culture: no growth   05-13-19: wbc 10.1;  hgb 7.2; hct 24.8; mcv 93.6 plt 593; glucose 142; bun 56; creat 1.57; k+ 4.5; na++144; ca 9.2; liver normal albumin 2.5   NO NEW LABS.   Review of Systems  Unable to perform ROS: Dementia (unable to participate )      Physical Exam Constitutional:      General: She is not in acute distress.    Appearance: She is well-developed. She is not diaphoretic.  Neck:     Musculoskeletal: Neck supple.     Thyroid: No thyromegaly.  Cardiovascular:     Rate and Rhythm: Normal rate and regular rhythm.     Heart sounds: Murmur present.     Comments: Pulses are faint 2/6 Pulmonary:     Effort: Pulmonary effort is normal. No respiratory distress.     Breath sounds: Normal breath sounds.  Abdominal:     General: Bowel sounds are normal. There is no distension.     Palpations: Abdomen is soft.     Tenderness: There is no abdominal tenderness.  Musculoskeletal:     Right lower leg: Edema present.     Left lower leg: Edema present.     Comments: Is able to move all extremities Status post left index finger amputation  Has trace bilateral lower extremity edema  Right wrist without swelling        Lymphadenopathy:     Cervical: No cervical adenopathy.  Skin:    General: Skin is warm and dry.     Comments: Unstaged sacral wound 3 x 6 cm yellow slough in wound bed is soft underneath   Neurological:     Mental Status: She is alert. Mental status is at baseline.  Psychiatric:     Comments: withdrawn      ASSESSMENT/ PLAN:  TODAY:   1. Unstagable pressure ulcer of sacral region: is without change; will continue current plan of care and will continue to monitor her status.   2. Atrial fibrillation, chronic: heart rate is stable will continue eliquis 2.5 mg twice daily   3. Hypertensive heart and kidney disease with chronic diastolic congestive heart failure and stage 4 chronic kidney disease: stable  b/p 132/64 will continue norvasc 2.5 mg daily   PREVIOUS  4. Hypomagnesemia: is stable mag 1.9 will monitor  5. Atrial fibrillation, chronic: heart rate stable will continue eliquis 2.5 mg twice daily   6. Chronic low back pain: stable will continue ultram 50 mg twice daily    7. Controlled type 2 diabetes mellitus with stage 4 chronic disease without long term current use of insulin: is stable her hgb a1c is 4.7; is off NPH will monitor her status  8. Stage 4 chronic kidney disease due to type 2 diabetes mellitus/hydroureterophrosis is without change bun 56; create 1.57; will monitor   9. Acute gout due to renal impairment involving right wrist: is stable will continue colchicine 0.3 mg daily; allopurinol 50 mg daily and prednisone 5 mg daily   10. Anemia due to stage 4 chronic kidney disease: is without change hgb 7.2 will continue hemocyte daily   11. Protein calore malnutrition severe: is without change albumin 2.5 will continue glucerna twice daily   12. Depression major single episode severe: without change: will stop zoloft per her son request   55. Chronic diastolic CHF (congestive heart failure) EF 50-55% (01-23-19) is stable will continue lasix 20 mg every other day.      MD is aware of resident's narcotic use and is in agreement with current  plan of care. We will attempt to wean resident as apropriate   Ok Edwards NP [facility]  Contact [phone] Monday through Friday 8am- 5pm  After hours call [phone]

## 2019-05-27 ENCOUNTER — Other Ambulatory Visit: Payer: Self-pay | Admitting: Adult Health

## 2019-05-27 ENCOUNTER — Non-Acute Institutional Stay (SKILLED_NURSING_FACILITY): Payer: Medicare Other | Admitting: Adult Health

## 2019-05-27 ENCOUNTER — Encounter: Payer: Self-pay | Admitting: Adult Health

## 2019-05-27 DIAGNOSIS — E43 Unspecified severe protein-calorie malnutrition: Secondary | ICD-10-CM

## 2019-05-27 DIAGNOSIS — R0902 Hypoxemia: Secondary | ICD-10-CM | POA: Diagnosis not present

## 2019-05-27 DIAGNOSIS — E1122 Type 2 diabetes mellitus with diabetic chronic kidney disease: Secondary | ICD-10-CM | POA: Diagnosis not present

## 2019-05-27 DIAGNOSIS — F0390 Unspecified dementia without behavioral disturbance: Secondary | ICD-10-CM | POA: Diagnosis not present

## 2019-05-27 DIAGNOSIS — R52 Pain, unspecified: Secondary | ICD-10-CM | POA: Diagnosis not present

## 2019-05-27 DIAGNOSIS — N184 Chronic kidney disease, stage 4 (severe): Secondary | ICD-10-CM

## 2019-05-27 DIAGNOSIS — Z7401 Bed confinement status: Secondary | ICD-10-CM | POA: Diagnosis not present

## 2019-05-27 DIAGNOSIS — R404 Transient alteration of awareness: Secondary | ICD-10-CM | POA: Diagnosis not present

## 2019-05-27 MED ORDER — MORPHINE SULFATE (CONCENTRATE) 10 MG /0.5 ML PO SOLN: 5.0000 mg | ORAL | 0 refills | Status: AC | PRN

## 2019-05-27 NOTE — Progress Notes (Signed)
Location:   [facility] Room Number: [place] of Service:  SNF (31)   CODE STATUS: DNR  Allergies  Allergen Reactions  . Lactose Intolerance (Gi) Other (See Comments)    Upset stomach    Chief Complaint  Patient presents with  . Discharge Note    Discharging to home with Hospice on 05/27/2019    HPI:  She has had a significant change in her status. She is no longer verbally responsive. She will open her eyes for a short time the close them back. She is unable to take po at this time. There are no signs of distress present. She does have some bilateral hand twitching present. Her son wants her home for her last days. We have contacted hospice care to follow her at home. She will be going home with her son. She will not need dme or home health. Will send RX for roxanol for her to have at home. All of her medications have been stopped    Past Medical History:  Diagnosis Date  . A-fib ([place])   . Anemia   . Arthritis    right knee  . Cataract   . CHF (congestive heart failure) ([place])   . CKD (chronic kidney disease), stage III ([place])   . Dehydration   . Diabetes mellitus   . Gout   . Headache(784.0)   . Hypertension   . UTI (lower urinary tract infection)     Past Surgical History:  Procedure Laterality Date  . ABDOMINAL HYSTERECTOMY    . AMPUTATION FINGER Left 01/22/2019   Procedure: Amputation Left Index Finger First Joint;  Surgeon: Iran Planas, MD;  Location: [facility];  Service: Orthopedics;  Laterality: Left;  . ANTERIOR AND POSTERIOR REPAIR  05/17/2011   Procedure: ANTERIOR (CYSTOCELE) AND POSTERIOR REPAIR (RECTOCELE);  Surgeon: Eli Hose, MD;  Location: [facility];  Service: Gynecology;  Laterality: N/A;  Anterior repair with TVT bladder sling and cystoscopy  . BLADDER SUSPENSION  05/17/2011   Procedure: TRANSVAGINAL TAPE (TVT) PROCEDURE;  Surgeon: Eli Hose, MD;  Location: [facility];  Service: Gynecology;  Laterality: N/A;  .  BREAST SURGERY     breast biopsy  . I&D EXTREMITY Left 01/22/2019   Procedure: IRRIGATION AND DEBRIDEMENT OF LEFT INDEX FINGER,;  Surgeon: Iran Planas, MD;  Location: [facility];  Service: Orthopedics;  Laterality: Left;  . IR FLUORO GUIDED NEEDLE PLC ASPIRATION/INJECTION LOC  04/05/2019    Social History   Socioeconomic History  . Marital status: Single    Spouse name: Not on file  . Number of children: Not on file  . Years of education: Not on file  . Highest education level: Not on file  Occupational History  . Not on file  Social Needs  . Financial resource strain: Not on file  . Food insecurity    Worry: Not on file    Inability: Not on file  . Transportation needs    Medical: Not on file    Non-medical: Not on file  Tobacco Use  . Smoking status: Never Smoker  . Smokeless tobacco: Never Used  Substance and Sexual Activity  . Alcohol use: No  . Drug use: No  . Sexual activity: Never    Birth control/protection: Abstinence  Lifestyle  . Physical activity    Days per week: Not on file    Minutes per session: Not on file  . Stress: Not on file  Relationships  . Social connections  Talks on phone: Not on file    Gets together: Not on file    Attends religious service: Not on file    Active member of club or organization: Not on file    Attends meetings of clubs or organizations: Not on file    Relationship status: Not on file  . Intimate partner violence    Fear of current or ex partner: Not on file    Emotionally abused: Not on file    Physically abused: Not on file    Forced sexual activity: Not on file  Other Topics Concern  . Not on file  Social History Narrative  . Not on file   History reviewed. No pertinent family history.    VITAL SIGNS BP 132/60   Pulse (!) 101   Temp 99.2 F (37.3 C)   Resp 18   Ht 5\' 6"  (1.676 m)   Wt 136 lb (61.7 kg)   SpO2 92%   BMI 21.95 kg/m   Outpatient Encounter Medications as of 05/27/2019  Medication Sig  .  acetaminophen (TYLENOL) 500 MG tablet Take 500 mg by mouth every 6 (six) hours as needed for mild pain.   Marland Kitchen allopurinol (ZYLOPRIM) 100 MG tablet Take 0.5 tablets (50 mg total) by mouth daily.  . Amino Acids-Protein Hydrolys (FEEDING SUPPLEMENT, PRO-STAT SUGAR FREE 64,) LIQD Take 30 mLs by mouth 2 (two) times daily with a meal.  . amLODipine (NORVASC) 5 MG tablet Take 5 mg by mouth daily.  Marland Kitchen apixaban (ELIQUIS) 2.5 MG TABS tablet Take 1 tablet (2.5 mg total) by mouth 2 (two) times daily.  . colchicine 0.6 MG tablet Take 0.5 tablets (0.3 mg total) by mouth daily.  . collagenase (SANTYL) ointment Clean sacral wound with normal saline. Apply santyl ointment to necrotic tissue and cover with Allevyn dressing.  Change daily and as needed  . feeding supplement, GLUCERNA SHAKE, (GLUCERNA SHAKE) LIQD Take 237 mLs by mouth 3 (three) times daily between meals.  . ferrous fumarate (HEMOCYTE - 106 MG FE) 325 (106 Fe) MG TABS tablet Take 1 tablet (106 mg of iron total) by mouth every morning.  . furosemide (LASIX) 20 MG tablet Take 20 mg by mouth every other day.   . NON FORMULARY Diet Type:  Dysphagia 2  . olopatadine (PATANOL) 0.1 % ophthalmic solution Place 1 drop into both eyes 2 (two) times daily.  . predniSONE (DELTASONE) 5 MG tablet Take 5 mg by mouth daily with breakfast.  . traMADol (ULTRAM) 50 MG tablet Take 1 tablet (50 mg total) by mouth 2 (two) times daily.  . Wound Dressings (ALLEVYN ADHESIVE) PADS Apply foam dressing to bilateral heels once a day every 3 days   No facility-administered encounter medications on file as of 05/27/2019.      SIGNIFICANT DIAGNOSTIC EXAMS  PREVIOUS:   03-25-19: chest x-ray: no active disease  03-25-19: ct of abdomen and pelvis:  1. Extensive colonic diverticulosis without evidence to suggest an acute diverticulitis at this time. 2. Mild left hydroureteronephrosis terminating in the proximal third of the left ureter. No obstructing stone identified. The possibility  of a ureteral stricture could be considered. 3. Small amount of gas non dependently in the urinary bladder. This may be iatrogenic if there has been recent catheterization. In the absence of catheterization, correlation with urinalysis is suggested to exclude the possibility of urinary tract infection with gas-forming organisms. 4. Aortic atherosclerosis. 5. Mild cardiomegaly. 6. Trace amount of biliary sludge and/or tiny gallstones lying dependently in  the gallbladder. No findings to suggest an acute cholecystitis at this time.  03-25-19: ct of head: chronic atrophy and ischemic changes without acute abnormality.   04-02-19: right wrist x-ray: chronic changes without acute abnormality   04-25-19: ct of head; No acute intracranial abnormality identified. Stable CT appearance of advanced chronic small vessel disease.  04-25-19: lumbar spine x-ray:  1. Scoliosis and associated disc osteophytic disease. 2. No acute findings in the lumbar spine identified.  04-28-19: ct of right wrist:  1. Severe osteopenia. Degree of osteopenia limits bone detail and evaluation for subtle traumatic injury. No acute osseous injury of the right wrist. Given the patient's severe osteopenia, if there is persistent clinical concern for an occult fracture, a MRI of the wrist is recommended for increased sensitivity.  04-28-19: ct of lumbar spine:  Moderate lumbar scoliosis. Multilevel degenerative change causing spinal and foraminal stenosis at multiple levels due to spurring as Above  04-28-19: MRI of lumbar spine:  1. Fluid signal within the L2-3, L3-4 and L5-S1 disc space is with mild endplate edema at S9-F0. This corresponds to areas of vacuum disc phenomenon seen on recent CT abdomen/pelvis dated 03/25/2019. Overall findings are more suggestive of degenerative disc disease and less likely discitis-osteomyelitis. It would be helpful to have a more recent CT of the lumbar spine to compare with the CT abdomen/pelvis  dated 03/24/2021 evaluate for any interval changes. 2. Severe bilateral facet arthropathy at L5-S1 with perifacet inflammatory changes. 3. Diffuse lumbar spine spondylosis as described above.  NO NEW EXAMS.    LABS REVIEWED PREVIOUS:   03-25-19: wbc 7.9; hgb 9.2; hct 29.5; mcv 90.8 plt 174; glucose 82; bun 45; creat 1.57; k+ 3.7; na++ 144; ca 9.4; liver normal albumin 3.6 BNP 736.3 blood culture: e-coli/enterobacteriaceae species urine culture: e-coli klebsiella pneumoniae  03-28-19" wbc 7.4; hgb 8.4; ht 26.5; mcv 90.8; plt 171 glucose 79; bun 36; creat 1.37; k+ 4.6; na++ 141; ca 9.5  03-30-19: wbc 8.9; hgb 8.9; hct 28.2; mcv 87.9 ;plt 208 glucose 116; bun 24; creat 1.26; k+ 4.2; na++ 135; ca 9.3 phos 2.0; albumin 3.1 mag 1.5  04-01-19: wbc 13.3; hgb 8.7; hct 27.2; mcv 87.2; plt 269; glucose 117; bun 24; creat 1.42; k+ 4.3; na++ 135; ca 9.3 phos 2.2; albumin 2.6; mag 1.6 04-03-19: wbc 8.3; hgb 8.8; hct 28.3; mcv 89.0; plt 414; glucose 105; bun 39; creat 1.41; k+ 4.8; na++ 133; ca 9.4 liver normal albumin 2.7 04-04-19: mag 1.9   04-06-19: glucose 97; bun 47; creat 1.59; k+ 4.7; na++ 137; ca 9.2 04-07-19: glucose 96; bun 47; creat 1.58; k+ 4.7 na++ 137; ca 9.0  04-11-19: wbc 8.1; hgb 7.6; hct 25.8; mcv 93.5; plt 658; glucose 76; bun 69; creat 1.56; k+ 5.5; na++ 137; ca 9.3; uric acid 8.5; hgb a1c 4.7  04-14-19: hgb 7.7; hct 26.2  glucose 55; bun 61; creat 1.66; k+ 4.6; na++ 140; ca 9.3;  04-16-19: hgb 8.6; hct 29.5; glucose 87; bun 69; creat 1.47; k+ 5.7; na++ 143; ca 8.9  04-17-19: glucose 46 bun 62; creat 1.70; k+ 4.6; na++ 143; ca 8.4  04-22-19: glucose 158; bun 84; creat 1.62; k+ 4.9; na++ 136; ca 8.6 04-25-19: urine culture: klebsiella pneumoniae: keflex 04-28-19: wbc 13.8; hgb 8.5; hct 26.6; mcv 89.0; plt 387; glucose 141; bun 36; creat 1.21; k+ 4.6; na++ 134; ca 8.9; blood culture: no growth   05-13-19: wbc 10.1; hgb 7.2; hct 24.8; mcv 93.6 plt 593; glucose 142; bun 56; creat 1.57; k+ 4.5;  na++144; ca 9.2;  liver normal albumin 2.5   NO NEW LABS.    Review of Systems  Unable to perform ROS: Patient unresponsive    Physical Exam Constitutional:      General: She is not in acute distress.    Appearance: She is well-developed. She is not diaphoretic.  Neck:     Musculoskeletal: Neck supple.     Thyroid: No thyromegaly.  Cardiovascular:     Rate and Rhythm: Normal rate. Rhythm irregular.     Pulses: Normal pulses.     Heart sounds: Normal heart sounds.  Pulmonary:     Effort: Pulmonary effort is normal. No respiratory distress.     Breath sounds: Normal breath sounds.  Abdominal:     General: Bowel sounds are normal. There is no distension.     Palpations: Abdomen is soft.     Tenderness: There is no abdominal tenderness.  Genitourinary:    Comments: Is able to move all extremities Status post left index finger amputation  Has trace bilateral lower extremity edema  Right wrist without swelling    Musculoskeletal:     Right lower leg: Edema present.     Left lower leg: Edema present.  Lymphadenopathy:     Cervical: No cervical adenopathy.  Skin:    General: Skin is warm and dry.     Comments: Unstaged sacral wound 3 x 6 cm yellow slough in wound bed is soft underneath    Neurological:     Comments: Not aware of her surroundings.  Does have mild bilateral hand twitching present         ASSESSMENT/ PLAN:   TODAY;   1.  Stage 4 chronic kidney disease due to type 2 diabetes mellitus  2. Dementia without behavioral disturbance unspecified dementia type 3. Protein calorie malnutrition severe  She is at end of life Will discharge her to home at this time with hospice care I have spoken with her son; he is in agreement with this plan of care Will send RX for roxanol 5 mg every 2 hours as needed for indications of pain or distress   Time spent with patient and son: 35 minutes: home health needs; medications dme needs.        MD is aware of resident's narcotic use  and is in agreement with current plan of care. We will attempt to wean resident as apropriate   Ok Edwards NP [facility]  Contact [phone] Monday through Friday 8am- 5pm  After hours call [phone]

## 2019-05-31 DEATH — deceased

## 2019-06-10 IMAGING — DX RIGHT WRIST - COMPLETE 3+ VIEW
4 series · 4 of 4 positions shown · non-contrast
Comparison: None.

CLINICAL DATA: Generalized wrist pain, no known injury, initial
encounter

EXAM:
RIGHT WRIST - COMPLETE 3+ VIEW

[wrist pa]
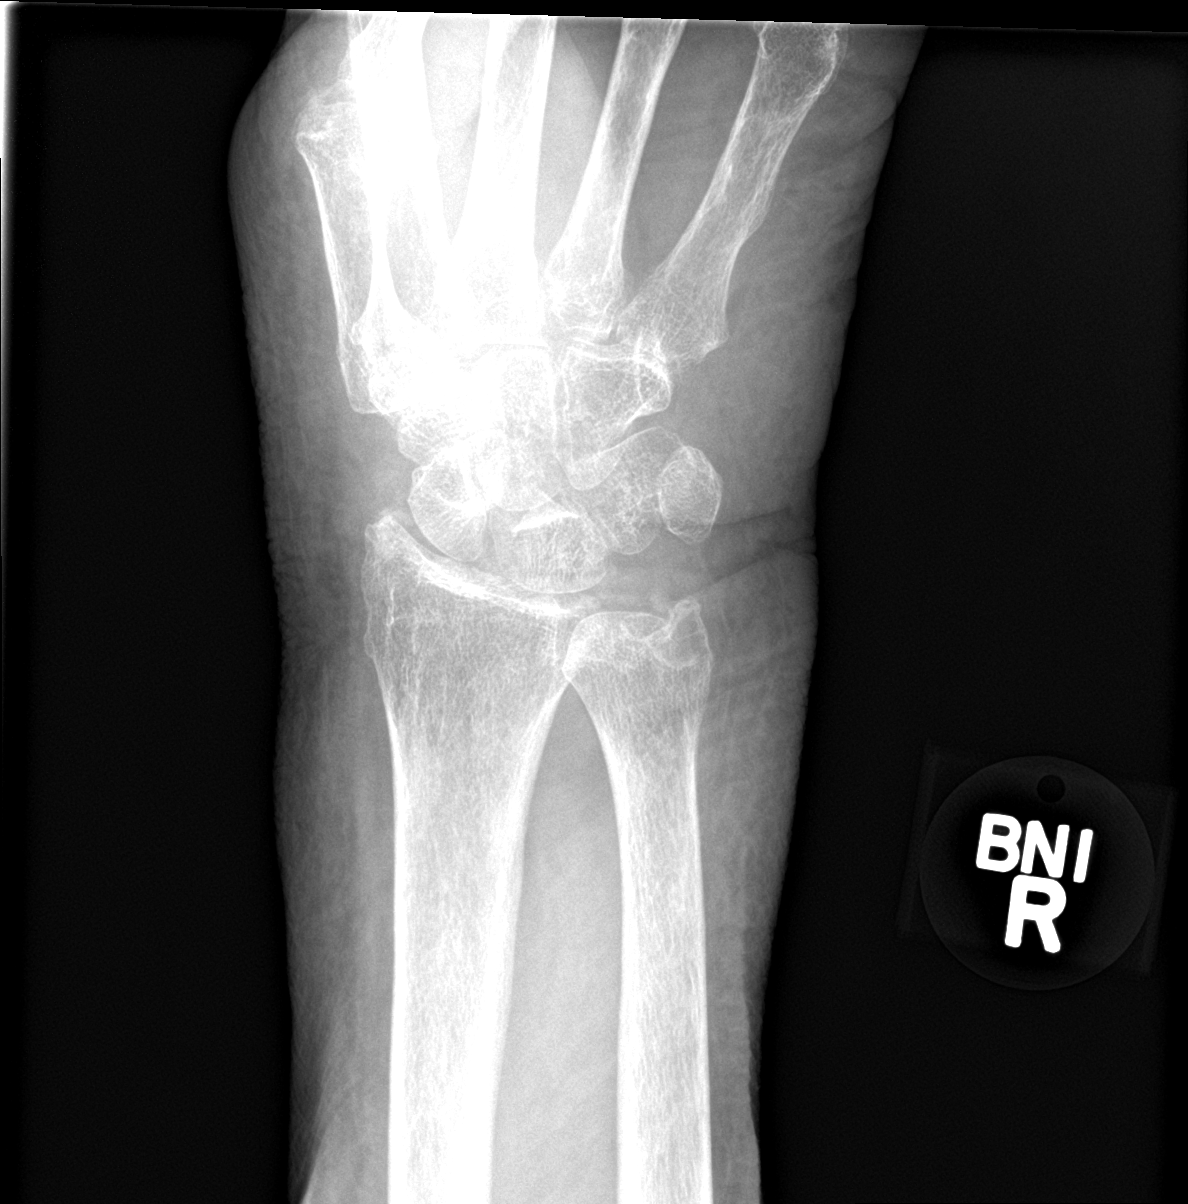

[wrist obl]
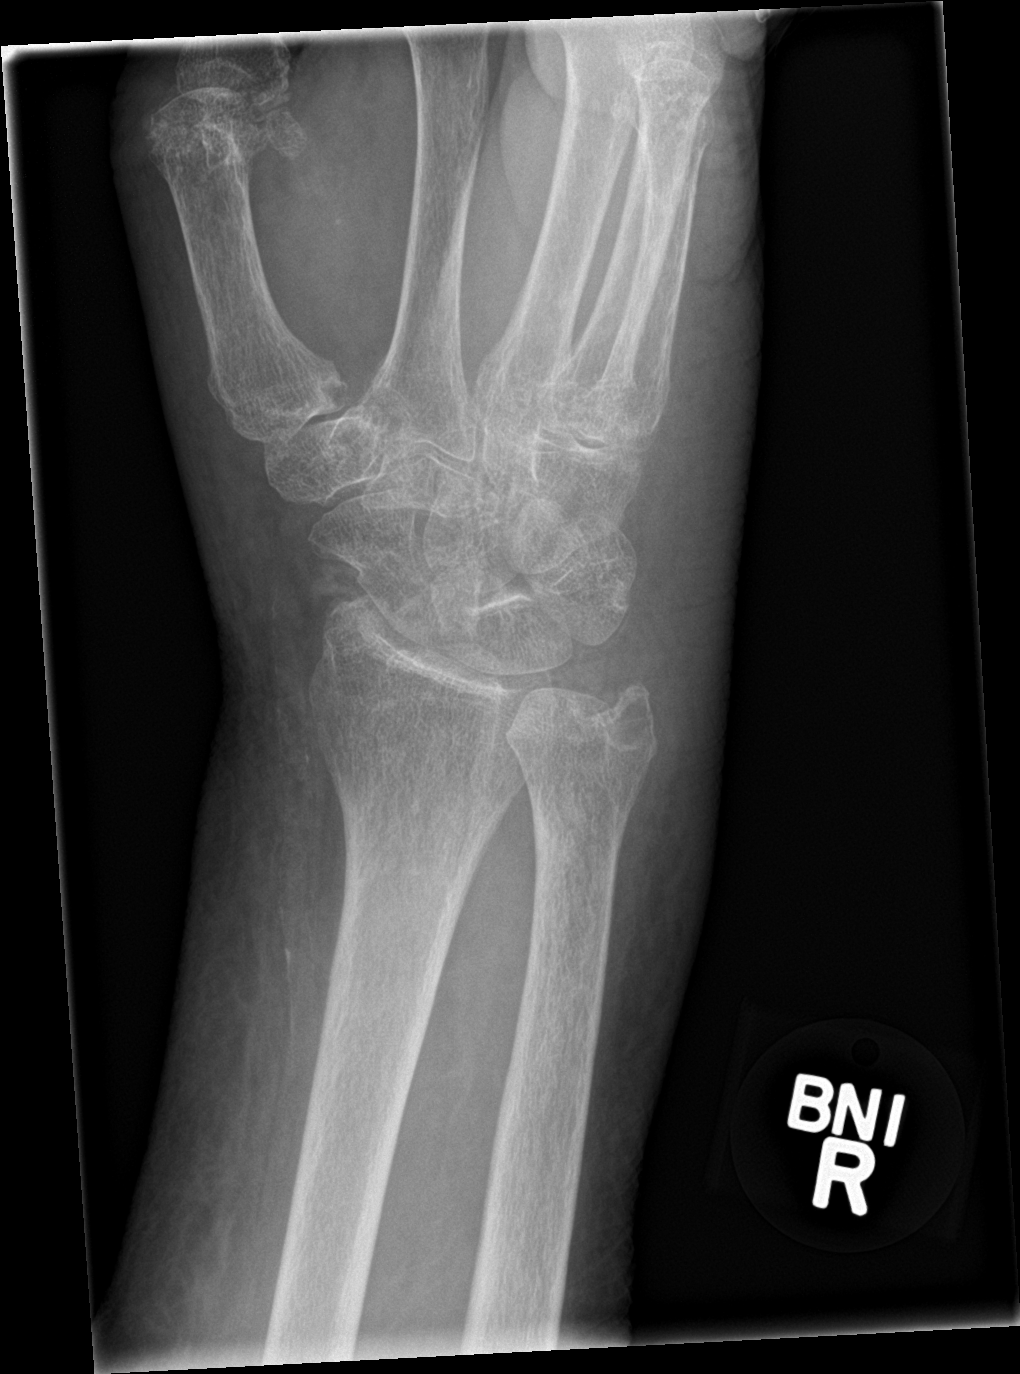

[wrist lat]
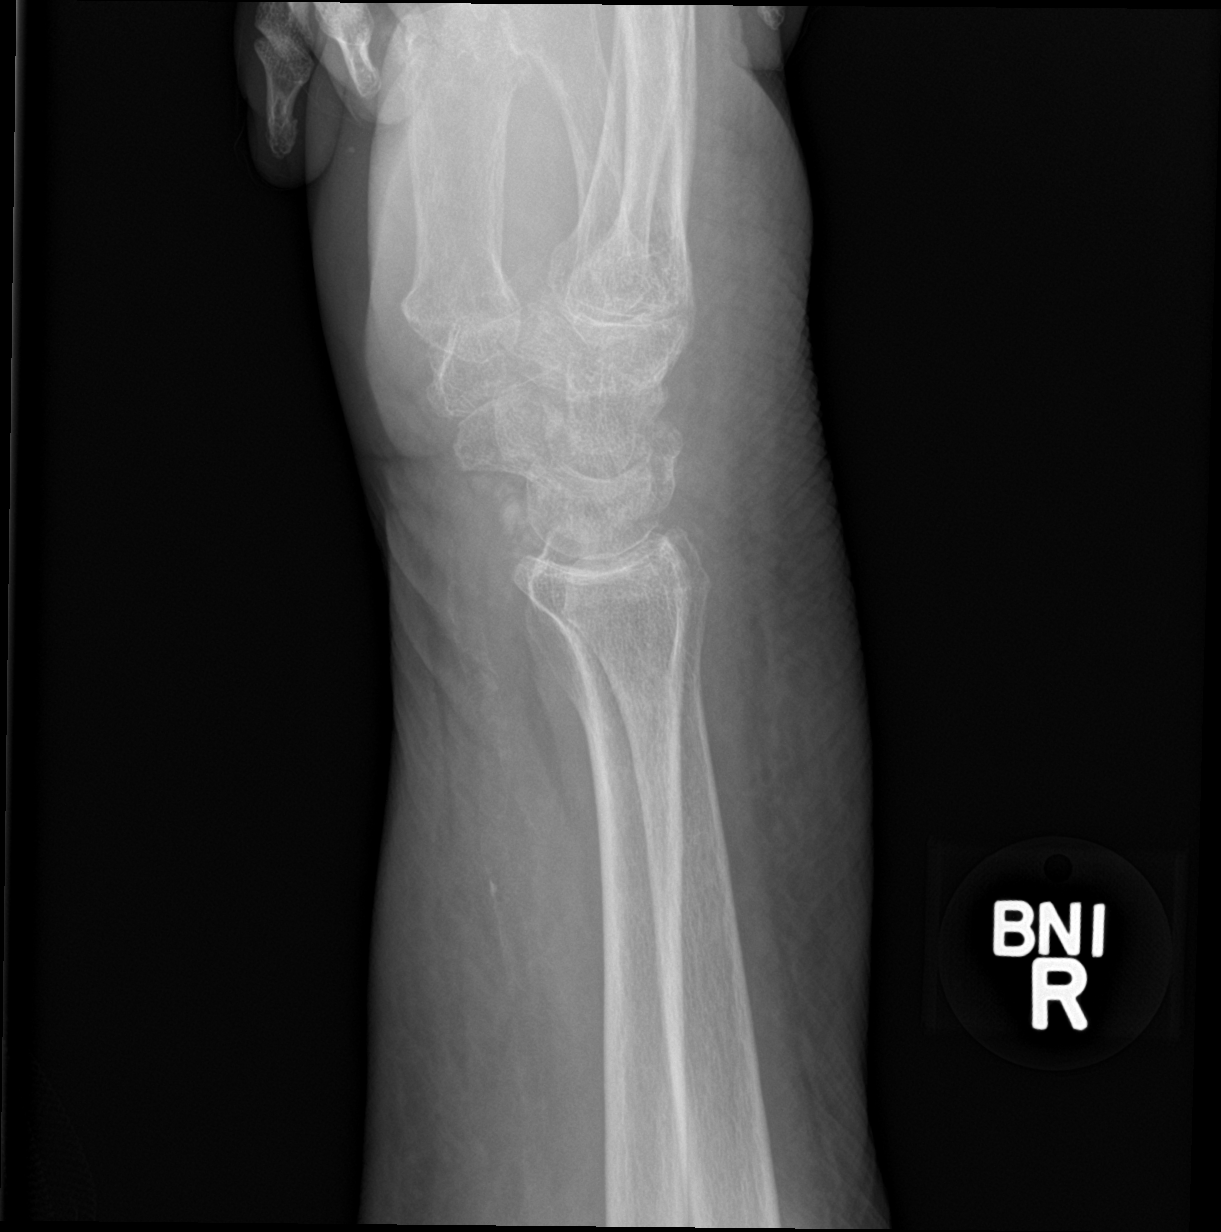

[wrist navicular]
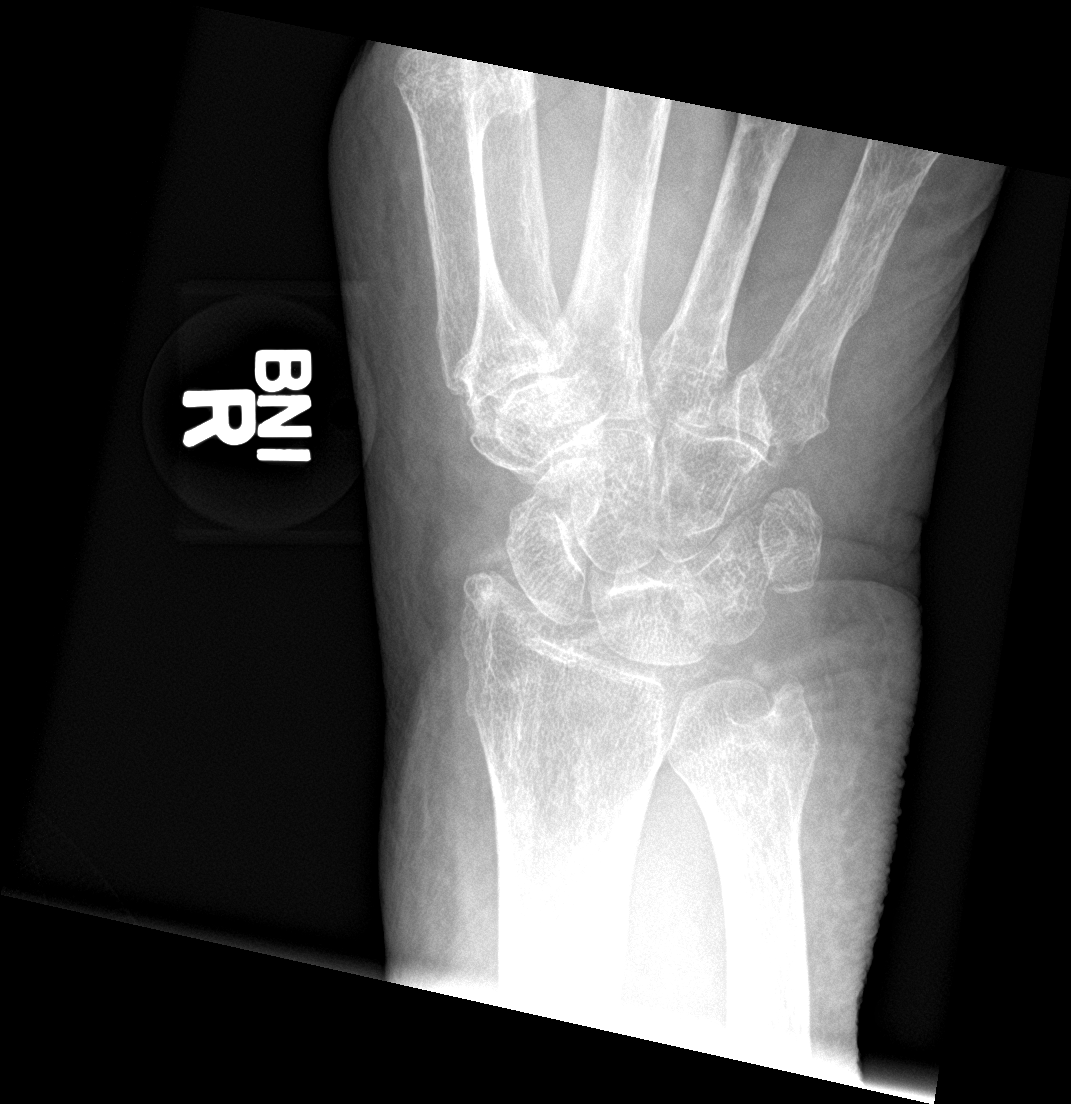

[4 of 4 positions shown; findings below may reference images not displayed]

FINDINGS: Mild degenerative changes are noted the first CMC joint. Some
calcifications are noted along the anterior aspect of the carpal
bones. Mild generalized soft tissue swelling is noted without acute
bony abnormality.
IMPRESSION: Chronic changes without acute abnormality.

## 2019-06-13 IMAGING — US IR FLUORO GUIDE NEEDLE PLACEMENT /BIOPSY
1 series · 1 of 1 positions shown · non-contrast
Comparison: Right wrist radiograph 04/02/2019

INDICATION: 87-year-old female with right wrist pain. Aspiration requested to
evaluate for gout versus septic arthritis.

EXAM:
ARTHROCENTESIS/INJECTION OF SMALL JOINT

[Series 1: ir fluoro guide needle placement /biopsy · 1 of 1 slices shown]
[im 1/1]
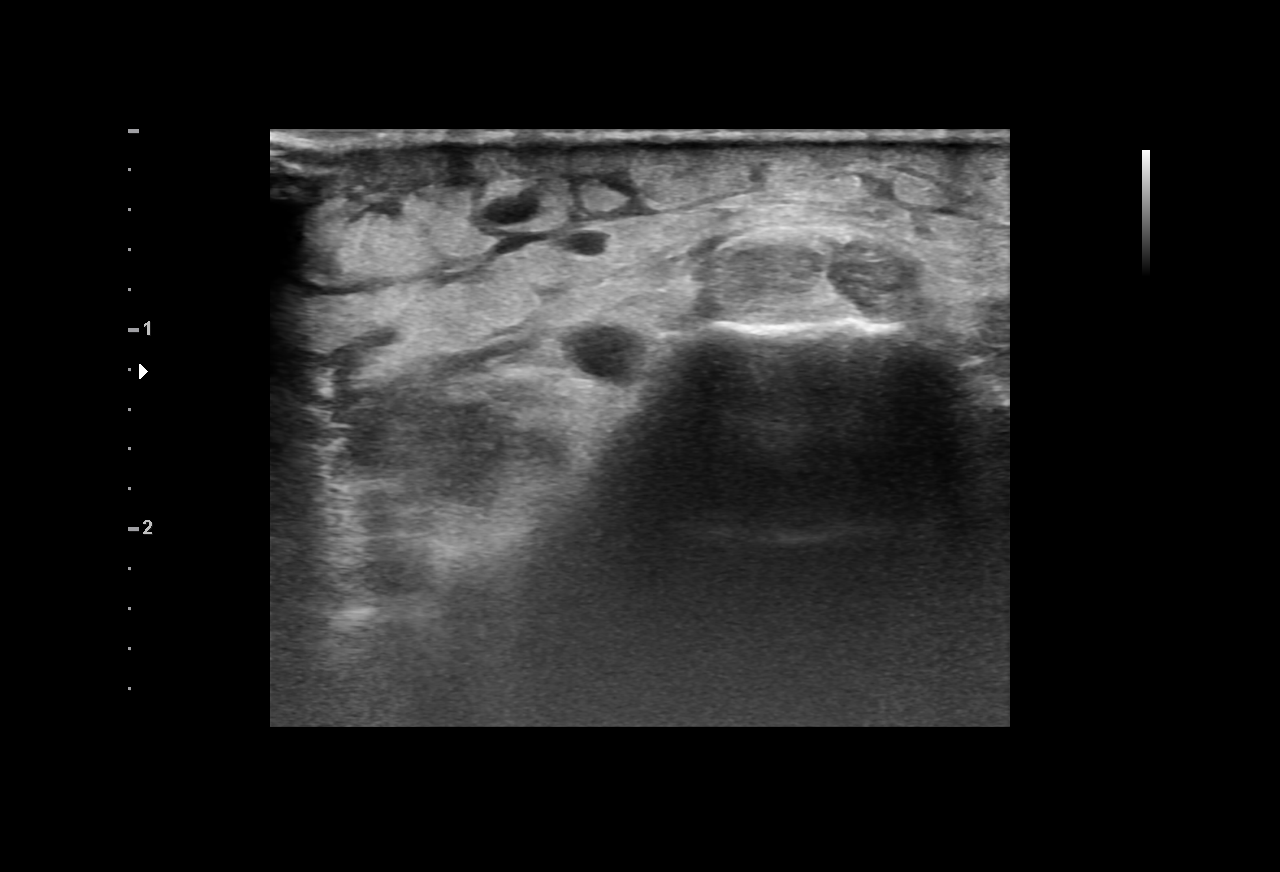

[1 of 1 positions shown; findings below may reference images not displayed]

CONTRAST:  None

FLUOROSCOPY TIME:  0 minutes 18 seconds 0.1 mGy

COMPLICATIONS:
None

PROCEDURE:
Informed written consent was obtained from the patient's son after
discussion of the risks, benefits and alternatives to treatment. The
patient was placed prone on the fluoroscopy table and the right
extremity was imaged. The right radiocarpal joint was localized with
fluoroscopy. The skin overlying the anterior aspect of the hip was
prepped and draped in usual sterile fashion. A 22 gauge needle was
then advanced into the radiocarpal joint using fluoroscopic
guidance, after the overlying soft tissues were anesthetized with 1%
lidocaine. A fluoroscopic image was saved and sent to PACs.

Approximately 1 mL bloody fluid was successfully aspirated. The
needle was removed and a dressing was placed. The patient tolerated
procedure well without immediate postprocedural complication.
IMPRESSION: Successful fluoroscopic guided aspiration of the right wrist.

## 2020-03-31 NOTE — Telephone Encounter (Signed)
error
